# Patient Record
Sex: Female | Born: 1965
Health system: Southern US, Community
[De-identification: ages and names within clinical notes are randomized; demographics above are authoritative.]

## PROBLEM LIST (undated history)

## (undated) DIAGNOSIS — Z8719 Personal history of other diseases of the digestive system: Secondary | ICD-10-CM

## (undated) DIAGNOSIS — M1990 Unspecified osteoarthritis, unspecified site: Secondary | ICD-10-CM

## (undated) DIAGNOSIS — M79645 Pain in left finger(s): Secondary | ICD-10-CM

## (undated) DIAGNOSIS — F32A Depression, unspecified: Secondary | ICD-10-CM

## (undated) DIAGNOSIS — J329 Chronic sinusitis, unspecified: Secondary | ICD-10-CM

## (undated) DIAGNOSIS — S0300XA Dislocation of jaw, unspecified side, initial encounter: Secondary | ICD-10-CM

## (undated) DIAGNOSIS — D72819 Decreased white blood cell count, unspecified: Secondary | ICD-10-CM

## (undated) DIAGNOSIS — G8929 Other chronic pain: Secondary | ICD-10-CM

## (undated) DIAGNOSIS — Z9889 Other specified postprocedural states: Secondary | ICD-10-CM

## (undated) DIAGNOSIS — M7711 Lateral epicondylitis, right elbow: Secondary | ICD-10-CM

## (undated) DIAGNOSIS — F329 Major depressive disorder, single episode, unspecified: Secondary | ICD-10-CM

## (undated) DIAGNOSIS — K219 Gastro-esophageal reflux disease without esophagitis: Secondary | ICD-10-CM

## (undated) DIAGNOSIS — R112 Nausea with vomiting, unspecified: Secondary | ICD-10-CM

## (undated) DIAGNOSIS — G4700 Insomnia, unspecified: Secondary | ICD-10-CM

## (undated) DIAGNOSIS — M25511 Pain in right shoulder: Secondary | ICD-10-CM

## (undated) DIAGNOSIS — M25559 Pain in unspecified hip: Secondary | ICD-10-CM

## (undated) DIAGNOSIS — R442 Other hallucinations: Secondary | ICD-10-CM

## (undated) HISTORY — PX: ESOPHAGOGASTRODUODENOSCOPY: SHX1529

## (undated) HISTORY — DX: Major depressive disorder, single episode, unspecified: F32.9

## (undated) HISTORY — DX: Personal history of other diseases of the digestive system: Z87.19

## (undated) HISTORY — PX: CERVICAL DISCECTOMY: SHX98

## (undated) HISTORY — DX: Depression, unspecified: F32.A

## (undated) HISTORY — DX: Dislocation of jaw, unspecified side, initial encounter: S03.00XA

## (undated) HISTORY — PX: HEMORROIDECTOMY: SUR656

## (undated) HISTORY — PX: OVARIAN CYST SURGERY: SHX726

## (undated) HISTORY — DX: Other chronic pain: G89.29

## (undated) HISTORY — DX: Other hallucinations: R44.2

## (undated) HISTORY — DX: Gastro-esophageal reflux disease without esophagitis: K21.9

## (undated) HISTORY — DX: Pain in unspecified hip: M25.559

## (undated) HISTORY — DX: Unspecified osteoarthritis, unspecified site: M19.90

## (undated) HISTORY — DX: Chronic sinusitis, unspecified: J32.9

## (undated) HISTORY — DX: Insomnia, unspecified: G47.00

## (undated) HISTORY — DX: Pain in right shoulder: M25.511

## (undated) HISTORY — PX: APPENDECTOMY: SHX54

## (undated) HISTORY — DX: Pain in left finger(s): M79.645

## (undated) HISTORY — DX: Decreased white blood cell count, unspecified: D72.819

## (undated) HISTORY — PX: TMJ ARTHROSCOPY: SHX1067

## (undated) HISTORY — DX: Lateral epicondylitis, right elbow: M77.11

---

## 1997-10-25 ENCOUNTER — Inpatient Hospital Stay (HOSPITAL_COMMUNITY): Admission: EM | Admit: 1997-10-25 | Discharge: 1997-10-29 | Payer: Self-pay | Admitting: Emergency Medicine

## 1999-01-29 ENCOUNTER — Other Ambulatory Visit: Admission: RE | Admit: 1999-01-29 | Discharge: 1999-01-29 | Payer: Self-pay | Admitting: Obstetrics and Gynecology

## 2001-05-19 ENCOUNTER — Other Ambulatory Visit: Admission: RE | Admit: 2001-05-19 | Discharge: 2001-05-19 | Payer: Self-pay | Admitting: Obstetrics and Gynecology

## 2002-06-19 ENCOUNTER — Other Ambulatory Visit: Admission: RE | Admit: 2002-06-19 | Discharge: 2002-06-19 | Payer: Self-pay | Admitting: Obstetrics and Gynecology

## 2003-10-11 ENCOUNTER — Other Ambulatory Visit: Admission: RE | Admit: 2003-10-11 | Discharge: 2003-10-11 | Payer: Self-pay | Admitting: Obstetrics and Gynecology

## 2008-03-07 ENCOUNTER — Ambulatory Visit: Payer: Self-pay | Admitting: *Deleted

## 2008-03-07 DIAGNOSIS — M1990 Unspecified osteoarthritis, unspecified site: Secondary | ICD-10-CM

## 2008-03-07 DIAGNOSIS — G43009 Migraine without aura, not intractable, without status migrainosus: Secondary | ICD-10-CM | POA: Insufficient documentation

## 2008-03-07 DIAGNOSIS — M542 Cervicalgia: Secondary | ICD-10-CM

## 2008-03-07 DIAGNOSIS — K219 Gastro-esophageal reflux disease without esophagitis: Secondary | ICD-10-CM

## 2008-03-07 DIAGNOSIS — Z8719 Personal history of other diseases of the digestive system: Secondary | ICD-10-CM

## 2008-03-07 DIAGNOSIS — J309 Allergic rhinitis, unspecified: Secondary | ICD-10-CM | POA: Insufficient documentation

## 2008-03-07 DIAGNOSIS — F32A Depression, unspecified: Secondary | ICD-10-CM | POA: Insufficient documentation

## 2008-03-07 DIAGNOSIS — S161XXA Strain of muscle, fascia and tendon at neck level, initial encounter: Secondary | ICD-10-CM | POA: Insufficient documentation

## 2008-03-07 DIAGNOSIS — F329 Major depressive disorder, single episode, unspecified: Secondary | ICD-10-CM

## 2008-03-07 HISTORY — DX: Unspecified osteoarthritis, unspecified site: M19.90

## 2008-03-07 HISTORY — DX: Personal history of other diseases of the digestive system: Z87.19

## 2008-03-07 HISTORY — DX: Allergic rhinitis, unspecified: J30.9

## 2008-03-07 HISTORY — DX: Gastro-esophageal reflux disease without esophagitis: K21.9

## 2008-03-07 HISTORY — DX: Cervicalgia: M54.2

## 2008-03-07 HISTORY — DX: Migraine without aura, not intractable, without status migrainosus: G43.009

## 2008-03-09 DIAGNOSIS — D72819 Decreased white blood cell count, unspecified: Secondary | ICD-10-CM | POA: Insufficient documentation

## 2008-03-09 LAB — CONVERTED CEMR LAB
ALT: 17 units/L (ref 0–35)
AST: 23 units/L (ref 0–37)
Alkaline Phosphatase: 58 units/L (ref 39–117)
BUN: 13 mg/dL (ref 6–23)
Basophils Absolute: 0 10*3/uL (ref 0.0–0.1)
Basophils Relative: 0.4 % (ref 0.0–3.0)
Calcium: 9 mg/dL (ref 8.4–10.5)
Chloride: 104 meq/L (ref 96–112)
Creatinine, Ser: 1 mg/dL (ref 0.4–1.2)
Eosinophils Absolute: 0.1 10*3/uL (ref 0.0–0.7)
Eosinophils Relative: 1.2 % (ref 0.0–5.0)
HCT: 39.7 % (ref 36.0–46.0)
MCHC: 33.8 g/dL (ref 30.0–36.0)
MCV: 90.7 fL (ref 78.0–100.0)
Monocytes Absolute: 0.7 10*3/uL (ref 0.1–1.0)
Neutro Abs: 2.4 10*3/uL (ref 1.4–7.7)
Neutrophils Relative %: 58 % (ref 43.0–77.0)
RBC: 4.38 M/uL (ref 3.87–5.11)
Total Bilirubin: 0.6 mg/dL (ref 0.3–1.2)
WBC: 4.2 10*3/uL — ABNORMAL LOW (ref 4.5–10.5)

## 2008-06-29 ENCOUNTER — Telehealth (INDEPENDENT_AMBULATORY_CARE_PROVIDER_SITE_OTHER): Payer: Self-pay | Admitting: *Deleted

## 2008-09-03 ENCOUNTER — Telehealth: Payer: Self-pay | Admitting: Internal Medicine

## 2008-09-12 ENCOUNTER — Ambulatory Visit: Payer: Self-pay | Admitting: *Deleted

## 2008-12-18 ENCOUNTER — Telehealth (INDEPENDENT_AMBULATORY_CARE_PROVIDER_SITE_OTHER): Payer: Self-pay | Admitting: *Deleted

## 2008-12-20 ENCOUNTER — Encounter: Payer: Self-pay | Admitting: Internal Medicine

## 2009-03-14 ENCOUNTER — Ambulatory Visit: Payer: Self-pay | Admitting: Internal Medicine

## 2009-03-14 LAB — CONVERTED CEMR LAB
Albumin: 4.3 g/dL (ref 3.5–5.2)
Basophils Relative: 0 % (ref 0–1)
Bilirubin, Direct: 0.1 mg/dL (ref 0.0–0.3)
CO2: 24 meq/L (ref 19–32)
Chloride: 107 meq/L (ref 96–112)
Creatinine, Ser: 1.13 mg/dL (ref 0.40–1.20)
Eosinophils Absolute: 0.1 10*3/uL (ref 0.0–0.7)
Glucose, Bld: 87 mg/dL (ref 70–99)
HDL: 55 mg/dL (ref >39)
Hemoglobin: 13.5 g/dL (ref 12.0–15.0)
LDL Cholesterol: 105 mg/dL — ABNORMAL HIGH (ref 0–99)
Lymphs Abs: 1.4 10*3/uL (ref 0.7–4.0)
MCHC: 32.5 g/dL (ref 30.0–36.0)
MCV: 88.9 fL (ref 78.0–100.0)
Monocytes Absolute: 0.8 10*3/uL (ref 0.1–1.0)
Monocytes Relative: 16 % — ABNORMAL HIGH (ref 3–12)
Neutrophils Relative %: 55 % (ref 43–77)
RBC: 4.67 M/uL (ref 3.87–5.11)
Sodium: 141 meq/L (ref 135–145)
TSH: 1.871 microintl units/mL (ref 0.350–4.500)
Total Bilirubin: 0.4 mg/dL (ref 0.3–1.2)
Total CHOL/HDL Ratio: 3.4
WBC: 5.2 10*3/uL (ref 4.0–10.5)

## 2009-05-08 ENCOUNTER — Ambulatory Visit (HOSPITAL_COMMUNITY): Admission: AD | Admit: 2009-05-08 | Discharge: 2009-05-09 | Payer: Self-pay | Admitting: Obstetrics & Gynecology

## 2009-07-03 ENCOUNTER — Telehealth: Payer: Self-pay | Admitting: Internal Medicine

## 2009-07-05 ENCOUNTER — Ambulatory Visit: Payer: Self-pay | Admitting: Family

## 2009-07-05 DIAGNOSIS — J329 Chronic sinusitis, unspecified: Secondary | ICD-10-CM | POA: Insufficient documentation

## 2009-07-05 DIAGNOSIS — K5289 Other specified noninfective gastroenteritis and colitis: Secondary | ICD-10-CM | POA: Insufficient documentation

## 2009-07-05 LAB — CONVERTED CEMR LAB
CO2: 26 meq/L (ref 19–32)
Chloride: 106 meq/L (ref 96–112)
Glucose, Bld: 81 mg/dL (ref 70–99)
HCT: 41.2 % (ref 36.0–46.0)
Hemoglobin: 13.3 g/dL (ref 12.0–15.0)
Lymphocytes Relative: 34 % (ref 12–46)
Lymphs Abs: 1.9 10*3/uL (ref 0.7–4.0)
Monocytes Absolute: 0.6 10*3/uL (ref 0.1–1.0)
Monocytes Relative: 10 % (ref 3–12)
Neutro Abs: 3.1 10*3/uL (ref 1.7–7.7)
Potassium: 4.4 meq/L (ref 3.5–5.3)
RBC: 4.69 M/uL (ref 3.87–5.11)
Sodium: 142 meq/L (ref 135–145)

## 2009-07-06 ENCOUNTER — Encounter: Payer: Self-pay | Admitting: Family

## 2009-07-08 ENCOUNTER — Telehealth: Payer: Self-pay | Admitting: Family

## 2009-07-22 ENCOUNTER — Encounter: Payer: Self-pay | Admitting: Internal Medicine

## 2009-07-23 LAB — CONVERTED CEMR LAB: Pap Smear: NORMAL

## 2009-09-16 ENCOUNTER — Telehealth: Payer: Self-pay | Admitting: Internal Medicine

## 2009-09-30 ENCOUNTER — Ambulatory Visit: Payer: Self-pay | Admitting: Family

## 2009-09-30 ENCOUNTER — Telehealth: Payer: Self-pay | Admitting: Family

## 2009-09-30 DIAGNOSIS — J40 Bronchitis, not specified as acute or chronic: Secondary | ICD-10-CM | POA: Insufficient documentation

## 2009-10-10 ENCOUNTER — Telehealth: Payer: Self-pay | Admitting: Internal Medicine

## 2009-10-11 ENCOUNTER — Ambulatory Visit: Payer: Self-pay | Admitting: Internal Medicine

## 2009-11-13 ENCOUNTER — Encounter: Payer: Self-pay | Admitting: Internal Medicine

## 2009-12-19 ENCOUNTER — Ambulatory Visit: Payer: Self-pay | Admitting: Internal Medicine

## 2009-12-25 ENCOUNTER — Encounter: Payer: Self-pay | Admitting: Internal Medicine

## 2010-01-09 ENCOUNTER — Encounter: Payer: Self-pay | Admitting: Internal Medicine

## 2010-02-04 ENCOUNTER — Encounter: Payer: Self-pay | Admitting: Internal Medicine

## 2010-02-19 ENCOUNTER — Encounter: Payer: Self-pay | Admitting: Internal Medicine

## 2010-03-03 ENCOUNTER — Encounter: Payer: Self-pay | Admitting: Internal Medicine

## 2010-03-03 ENCOUNTER — Ambulatory Visit: Payer: Self-pay | Admitting: Family

## 2010-03-03 DIAGNOSIS — K140 Glossitis: Secondary | ICD-10-CM | POA: Insufficient documentation

## 2010-03-03 LAB — CONVERTED CEMR LAB
Folate: >20 ng/mL
Vitamin B-12: 670 pg/mL (ref 211–911)

## 2010-03-04 ENCOUNTER — Encounter: Payer: Self-pay | Admitting: Family

## 2010-04-08 LAB — HM PAP SMEAR: HM Pap smear: NORMAL

## 2010-04-28 ENCOUNTER — Encounter: Payer: Self-pay | Admitting: Internal Medicine

## 2010-04-28 LAB — HM MAMMOGRAPHY

## 2010-06-08 LAB — HM PAP SMEAR: HM Pap smear: NORMAL

## 2010-06-30 ENCOUNTER — Encounter: Payer: Self-pay | Admitting: Neurosurgery

## 2010-07-08 NOTE — Progress Notes (Signed)
Summary: pharmacy correction  Phone Note Call from Patient   Caller: Patient Details for Reason: Rx   Summary of Call: Pt was seen today-- rx for tessuslon pearls and Z-pak    [facility]   [address]   HP  she is at pharmacy now. all other med to [facility]  . Initial call taken by: Darral Dash,  September 30, 2009 12:21 PM  Follow-up for Phone Call        Sp[facility] to [facility] and gave verbal rx for [employer] and Z-pak. Left message on [facility] pharmacy voicemail to cancel rx for [employer] and Zpak. Helen notified pt. that I was correcting rxs.  Mervin Kung CMA  September 30, 2009 12:40 PM

## 2010-07-08 NOTE — Letter (Signed)
Summary: [facility]  [facility]   Imported By: Lanelle Bal 11/05/2009 09:13:45  _____________________________________________________________________  External Attachment:    Type:   Image     Comment:   External Document

## 2010-07-08 NOTE — Progress Notes (Signed)
Summary: Status Update  Phone Note Call from Patient Call back at Home Phone [phone]   Caller: Patient Summary of Call: patient called and left voice message requesting a call back because she is not feeling any better.  call returned to patient she is complaining of unresolved cough and chest congestion, she was seen last week  for Bronchitis and she is still having some nasal drainage yellow in color and productive cough the same, with the chest congestion- she denies wheezing. Cough is worse at night and less throughout the day. She denies fever, but states she did not check it.  She has taken Tylenol with some improvement with the pain from cough, Mucinex and Sudafed with little to no relief. She states she has completed a Z-pak, but was not sure if she would need another round of antibiotics. Initial call taken by: Glendell Docker CMA,  Oct 10, 2009 1:16 PM  Follow-up for Phone Call        Pt needs OV Follow-up by: D. Thomos Lemons DO,  Oct 10, 2009 5:24 PM  Additional Follow-up for Phone Call Additional follow up Details #1::        attempted to contact patient at [phone] no answer, voice message left informing patient per Dr Artist Pais instructions. She was advised to call back to schedule office visit. Additional Follow-up by: Glendell Docker CMA,  Oct 10, 2009 5:34 PM

## 2010-07-08 NOTE — Letter (Signed)
Summary: [facility]  [facility]   Imported By: Lanelle Bal 02/07/2010 09:08:41  _____________________________________________________________________  External Attachment:    Type:   Image     Comment:   External Document

## 2010-07-08 NOTE — Letter (Signed)
Summary: [facility]  [facility]   Imported By: Lanelle Bal 12/06/2009 08:15:32  _____________________________________________________________________  External Attachment:    Type:   Image     Comment:   External Document

## 2010-07-08 NOTE — Assessment & Plan Note (Signed)
Summary: F/U -DK rsc with pt/mhf   Vital Signs:  Patient profile:   45 year old female Menstrual status:  regular Weight:      153.50 pounds BMI:     24.13 O2 Sat:      98 % on 0.25 L/min Temp:     98.0 degrees F oral Pulse rate:   80 / minute Pulse rhythm:   regular Resp:     18 per minute BP sitting:   100 / 60  (right arm) Cuff size:   regular  Vitals Entered By: Glendell Docker CMA (December 19, 2009 9:11 AM)  O2 Flow:  0.25 L/min CC: Rm 3- Follow up  Is Patient Diabetic? No Pain Assessment Patient in pain? no      Comments medication refills,s/p cervical  disketomy 6/9 Dr Wyline Mood @ [facility], f/u next week Wednesday   Primary Care Provider:  D. Thomos Lemons DO  CC:  Rm 3- Follow up .  History of Present Illness: 85 /o white female for f/u anterior cervical disectomy completed at [facility] - Dr Wyline Mood neck feeling much better  migraine headaches - stable  Depression - stable  Preventive Screening-Counseling & Management  Alcohol-Tobacco     Smoking Status: never  Allergies: 1)  ! 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   Neck:  incision well healed Lungs:  normal respiratory effort, normal breath sounds, no crackles, and no wheezes.   Heart:  normal rate, regular rhythm, no murmur, and no gallop.     Impression &  Recommendations:  Problem # 1:  NECK PAIN, CHRONIC (ICD-723.1) Assessment Improved S/P C5-6 cervical fusion.  chronic pain and restriction in ROM resolved.  we discussed tapering gabapentin to 300 mg at bedtime  The following medications were removed from the medication list:    Tylenol Extra Strength 500 Mg Tabs (Acetaminophen) .Marland Kitchen... As needed.  Problem # 2:  DEPRESSION (ICD-311) stable.   we discussed tapering to 300 mg once daily  Her updated medication list for this problem includes:    Wellbutrin Xl 150 Mg Xr24h-tab (Bupropion hcl) .Marland KitchenMarland KitchenMarland KitchenMarland Kitchen 3 tablets by mouth once daily  Problem # 3:  COMMON MIGRAINE (ICD-346.10) she has had infrequent migraines.   maintain gabapentin at bedtime for headache prophylaxis. less headaches since neck surgery The following medications were removed from the medication list:    Tylenol Extra Strength 500 Mg Tabs (Acetaminophen) .Marland Kitchen... As needed. Her updated medication list for this problem includes:    Imitrex 50 Mg Tabs (Sumatriptan succinate) .Marland Kitchen... 1 tab at onset, and may repeat in 2 hours if not relieved, max of 3 tabs in 24 hours  Complete Medication List: 1)  Neurontin 300 Mg Caps (Gabapentin) .... One tablet by mouth 3 times a day 2)  Wellbutrin Xl 150 Mg Xr24h-tab (Bupropion hcl) .... 3 tablets by mouth once daily 3)  Claritin 10 Mg  Caps (Loratadine) .... Take 1 tablet daily as needed 4)  Imitrex 50 Mg Tabs (Sumatriptan succinate) .Marland Kitchen.. 1 tab at onset, and may repeat in 2 hours if not relieved, max of 3 tabs in 24 hours 5)  Lunesta 2 Mg Tabs (Eszopiclone) .... One tab by mouth at bedtime 6)  Citracal Plus Tabs (Multiple minerals-vitamins) .... Take 1 tablet by mouth once a day 7)  Centrum Tabs (Multiple vitamins-minerals) .... Take 1 tablet by mouth once a day  Patient Instructions: 1)  Please schedule a follow-up appointment in 6 months. 2)  BMP prior to visit, ICD-9: 311 3)  Hepatic Panel prior to visit, ICD-9: 311 4)  TSH prior to visit,  ICD-9: 311 5)  Please return for lab work one (1) week before your next appointment.  6)  The highlighted prescriptions were electronically sent to your pharmacy Prescriptions: IMITREX 50 MG TABS (SUMATRIPTAN SUCCINATE) 1 tab at onset, and may repeat in 2 hours if not relieved, max of 3 tabs in 24 hours  #12 x 5   Entered and Authorized by:   D. Thomos Lemons DO   Signed by:   D. Thomos Lemons DO on 12/19/2009   Method used:   Electronically to        [facility]* (retail)       [facility] [place], [place]  [address]       Ph: [phone]       Fax: [phone]   RxID:   [id number] NEURONTIN 300 MG CAPS (GABAPENTIN) one tablet by mouth 3 times a day  #270 x 5   Entered and Authorized by:   D. Thomos Lemons DO   Signed by:   D. Thomos Lemons DO on 12/19/2009   Method used:   Electronically to        [facility]* (retail)       [facility] [place], [place]  [address]       Ph: [phone]       Fax: [phone]   RxID:   [id number] WELLBUTRIN XL 150 MG XR24H-TAB (BUPROPION HCL) 3 tablets by mouth once daily Brand medically necessary #270 x 1   Entered and Authorized by:   D. Thomos Lemons DO   Signed by:   D. Thomos Lemons DO on 12/19/2009   Method used:   Electronically to        [facility]* (retail)       [facility] [place], [place]  [address]       Ph: [phone]       Fax: [phone]   RxID:   [id number]   Current Allergies (reviewed today): ! PCN

## 2010-07-08 NOTE — Assessment & Plan Note (Signed)
Summary: nauseated, no appetite- jr   Vital Signs:  Patient profile:   45 year old female Menstrual status:  regular Weight:      154 pounds Temp:     97.5 degrees F oral Pulse rate:   66 / minute BP sitting:   110 / 70  (right arm)  Vitals Entered By: Doristine Devoid (July 05, 2009 3:43 PM) CC: sinus pressure and HA along w/ nausea x1 wk no vomiting some loose stool   Primary Care Provider:  Dondra Spry DO  CC:  sinus pressure and HA along w/ nausea x1 wk no vomiting some loose stool.  History of Present Illness: Ms Kunin is a 45 year old female who noted nause, abd pain,cramping which started on friday.  Had temp 99.5 on monday night.  She continues to have anorexia.  Today she feel more nausea, gurgling.  Also notes + nasal congestion which is accompanied with sinus congestion.  Slightly yellow nasal discharge, denies cough.  Works in a lab.  She is having some loose stools about 2-3 x a day since monday.  Denies blood in stool. Tells me that she has not thrown up.  Denies travel outside of the country.  Notes that she had a laparoscopy 12/1.  Denies recent antibiotic use.    Allergies: 1)  ! Pcn  Physical Exam  General:  Well-developed,well-nourished,in no acute distress; alert,appropriate and cooperative throughout examination Head:  Normocephalic and atraumatic without obvious abnormalities. No apparent alopecia or balding. Eyes:  No corneal or conjunctival inflammation noted. EOMI. Perrla. Funduscopic exam benign, without hemorrhages, exudates or papilledema. Vision grossly normal. Ears:  External ear exam shows no significant lesions or deformities.  Otoscopic examination reveals clear canals, tympanic membranes are intact bilaterally without bulging, retraction, inflammation or discharge. Hearing is grossly normal bilaterally. Mouth:  Oral mucosa and oropharynx without lesions or exudates.  Teeth in good repair. Neck:  No deformities, masses, or tenderness noted. Lungs:   Normal respiratory effort, chest expands symmetrically. Lungs are clear to auscultation, no crackles or wheezes. Heart:  Normal rate and regular rhythm. S1 and S2 normal without gallop, murmur, click, rub or other extra sounds. Abdomen:  mild RLQ tenderness in area of recent laparoscopy.  No guarding, no distension.     Impression & Recommendations:  Problem # 1:  GASTROENTERITIS (ICD-558.9) Assessment New Will treat with levaquin, check CBC to evaluate WBC and check BMET to assess hydration. Will give zofran for nausea.  Encouraged fluids, progress diet as tolerated.  If symptoms worsen will consider CT abd/pelvis, but hopefully will start to improve in the next few days.   Orders: T-Basic Metabolic Panel [phone]) T-CBC w/Diff [phone])  Her updated medication list for this problem includes:    Zofran 4 Mg Tabs (Ondansetron hcl) ..... One tablet by mouth every 8 hours as needed for nausea  Problem # 2:  SINUSITIS (ICD-473.9) Assessment: New will treat with levaquin.   Her updated medication list for this problem includes:    Flonase 50 Mcg/act Susp (Fluticasone propionate) .Marland Kitchen... 1 spray each nostril once daily    Levaquin 500 Mg Tabs (Levofloxacin) ..... One tablet by mouth daily x 7 days  Complete Medication List: 1)  Neurontin 300 Mg Caps (Gabapentin) .... One tablet by mouth 3 times a day 2)  Wellbutrin Xl 150 Mg Xr24h-tab (Bupropion hcl) .... 3 tablets by mouth once daily (patient cannot tolerate generic - please give as written) 3)  Claritin 10 Mg Caps (Loratadine) .... As  needed 4)  Imitrex 50 Mg Tabs (Sumatriptan succinate) .Marland Kitchen.. 1 tab at onset, and may repeat in 2 hours if not relieved, max of 3 tabs in 24 hours 5)  Lunesta 2 Mg Tabs (Eszopiclone) .... One tab by mouth at bedtime 6)  Flonase 50 Mcg/act Susp (Fluticasone propionate) .Marland Kitchen.. 1 spray each nostril once daily 7)  Zofran 4 Mg Tabs (Ondansetron hcl) .... One tablet by mouth every 8 hours as needed for  nausea 8)  Levaquin 500 Mg Tabs (Levofloxacin) .... One tablet by mouth daily x 7 days  Patient Instructions: 1)  Please call if you develop fever over 101 or worsening abdominal pain, or if your appetite does not start to improve.   2)  Go to ER if you develop severe abdominal pain or bloody stools. Prescriptions: LEVAQUIN 500 MG TABS (LEVOFLOXACIN) one tablet by mouth daily x 7 days  #7 x 0   Entered and Authorized by:   Lemont Fillers FNP   Signed by:   Lemont Fillers FNP on 07/05/2009   Method used:   [facility] [phone]* (retail)       [address]       [place], [place]  [address]       Ph: [phone]       Fax: [phone]   RxID:   [id number] ZOFRAN 4 MG TABS (ONDANSETRON HCL) one tablet by mouth every 8 hours as needed for nausea  #300 x 0   Entered and Authorized by:   Lemont Fillers FNP   Signed by:   Lemont Fillers FNP on 07/05/2009   Method used:   [facility]        [facility] [phone]* (retail)       [address]       [place], [place]  [address]       Ph: [phone]       Fax: [phone]   RxID:   [id number]   Appended Document: nauseated, no appetite- jr called pharmacy- clarified zofran is #30, not #300 sent in error.

## 2010-07-08 NOTE — Progress Notes (Signed)
Summary: REFILL REQUEST  Phone Note Refill Request Message from:  Fax from Pharmacy on September 16, 2009 10:00 AM  Refills Requested: Medication #1:  NEURONTIN 300 MG CAPS one tablet by mouth 3 times a day   Dosage confirmed as above?Dosage Confirmed   Brand Name Necessary? No   Supply Requested: 3 months   Last Refilled: 07/22/2009  Medication #2:  WELLBUTRIN XL 150 MG XR24H-TAB 3 tablets by mouth once daily (patient cannot tolerate generic - please give as written)   Dosage confirmed as above?Dosage Confirmed   Brand Name Necessary? No   Supply Requested: 3 months   Last Refilled: 06/28/2009 [facility] [address] [place] [phone] FAX [phone]   Method Requested: Electronic Next Appointment Scheduled: NONE Initial call taken by: Roselle Locus,  September 16, 2009 10:05 AM  Follow-up for Phone Call        refill x 1.  needs OV for additional refills Follow-up by: D. Thomos Lemons DO,  September 16, 2009 2:41 PM  Additional Follow-up for Phone Call Additional follow up Details #1::        [facility], call placed to patient, she was advised follow up was needed for additional refills. Office visit has been scheduled fro 10/03/2009 Additional Follow-up by: Glendell Docker CMA,  September 16, 2009 3:22 PM    Prescriptions: WELLBUTRIN XL 150 MG XR24H-TAB (BUPROPION HCL) 3 tablets by mouth once daily (patient cannot tolerate generic - please give as written)  #30 x 0   Entered by:   Glendell Docker CMA   Authorized by:   D. Thomos Lemons DO   Signed by:   Glendell Docker CMA on 09/16/2009   Method used:   Faxed to ...       [facility] (mail-order)             [place], [place]         Ph: [phone]       Fax: [phone]   RxID:   [id number] NEURONTIN 300 MG CAPS (GABAPENTIN) one tablet by mouth 3 times a day  #90 x 0   Entered by:   Glendell Docker CMA   Authorized by:   D. Thomos Lemons DO   Signed by:    Glendell Docker CMA on 09/16/2009   Method used:   Faxed to ...       [facility] (mail-order)             [place], [place]         Ph: [phone]       Fax: [phone]   RxID:   [id number] WELLBUTRIN XL 150 MG XR24H-TAB (BUPROPION HCL) 3 tablets by mouth once daily (patient cannot tolerate generic - please give as written)  #30 x 0   Entered by:   Glendell Docker CMA   Authorized by:   D. Thomos Lemons DO   Signed by:   Glendell Docker CMA on 09/16/2009   Method used:   [facility]ronically to        [facility] [phone]* (retail)       [address]       [place], [place]  [address]       Ph: [phone]       Fax: [phone]   RxID:   [id number] NEURONTIN 300 MG CAPS (GABAPENTIN) one tablet by mouth 3 times a day  #  90 x 0   Entered by:   Glendell Docker CMA   Authorized by:   D. Thomos Lemons DO   Signed by:   Glendell Docker CMA on 09/16/2009   Method used:   [facility]ronically to        [facility] [phone]* (retail)       [address]       [place], [place]  [address]       Ph: [phone]       Fax: [phone]   RxID:   [id number]

## 2010-07-08 NOTE — Progress Notes (Signed)
Summary: Wellbuttrin - 2 Day Supply  Phone Note Refill Request Message from:  Patient on July 03, 2009 11:33 AM  Refills Requested: Medication #1:  WELLBUTRIN XL 150 MG XR24H-TAB 3 tablets by mouth once daily (patient cannot tolerate generic - please give as written)   Dosage confirmed as above?Dosage Confirmed   Brand Name Necessary? No PATIENT IS HOME SICK.  SHE LEFT HER WELLBUTRIN IN HER OFFICE IN [place].  SHE DOES NOT WANT TO DRIVE TO [place]  COULD YOU CALL IN 2 DAYS TO [facility] [address]    [phone]   Method Requested: Telephone to Pharmacy Initial call taken by: Roselle Locus,  July 03, 2009 11:35 AM    Prescriptions: WELLBUTRIN XL 150 MG XR24H-TAB (BUPROPION HCL) 3 tablets by mouth once daily (patient cannot tolerate generic - please give as written)  #6 x 0   Entered by:   Glendell Docker CMA   Authorized by:   D. Thomos Lemons DO   Signed by:   Glendell Docker CMA on 07/03/2009   Method used:   Electronically to        [facility] [phone]* (retail)       [address]       [place], [place]  [address]       Ph: [phone]       Fax: [phone]   RxID:   [id number]

## 2010-07-08 NOTE — Letter (Signed)
   [place] [facility] [address] [place], [place]  [address]  [place] Phone: [phone]      March 04, 2010   [facility] [address] [place], [place] [address]  RE:  LAB RESULTS  Dear  Ms. ROUILLARD,  The following is an interpretation of your most recent lab tests.  Please take note of any instructions provided or changes to medications that have resulted from your lab work.   Your B12 and Folate levels are normal.    Sincerely Yours,    Lemont Fillers FNP  Appended Document:  Mailed.

## 2010-07-08 NOTE — Miscellaneous (Signed)
Summary: 6 month follow up labs  Clinical Lists Changes  Orders: Added new Test order of T-Basic Metabolic Panel [phone]) - Signed Added new Test order of T-Hepatic Function [phone]) - Signed Added new Test order of T-TSH [phone]) - Signed

## 2010-07-08 NOTE — Miscellaneous (Signed)
Summary: mammogram  Clinical Lists Changes  Observations: Added new observation of MAMMOGRAM: stable, no suspicious findings. (04/28/2010 13:35)      Preventive Care Screening  Mammogram:    Date:  04/28/2010    Results:  stable, no suspicious findings.

## 2010-07-08 NOTE — Letter (Signed)
Summary: [facility]  [facility]   Imported By: Lanelle Bal 03/11/2010 09:16:36  _____________________________________________________________________  External Attachment:    Type:   Image     Comment:   External Document

## 2010-07-08 NOTE — Assessment & Plan Note (Signed)
Summary: congestion cough/mhf   Vital Signs:  Patient profile:   45 year old female Menstrual status:  regular Height:      67 inches Weight:      156.50 pounds BMI:     24.60 O2 Sat:      100 % on Room air Temp:     98.3 degrees F oral Pulse rate:   66 / minute Pulse rhythm:   regular Resp:     20 per minute BP sitting:   106 / 60  (right arm) Cuff size:   regular  Vitals Entered By: Glendell Docker CMA (Oct 11, 2009 10:17 AM)  O2 Flow:  Room air CC: Rm 2- Head congestion Is Patient Diabetic? No   Primary Care Provider:  Dondra Spry DO  CC:  Rm 2- Head congestion.  History of Present Illness: 45 y/o female c/o productive cough yellow in color, head/sinus congestion, and nasal drainage, no fever, no sore throat, ongoing for the past 2 weeks.  completed Z-Pak about 1 week and half ago, no improvement. Some improvement in chest tightness. Cough is worse at night when she lays down and drags throughout the day  Preventive Screening-Counseling & Management  Alcohol-Tobacco     Smoking Status: never  Allergies: 1)  ! 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   Ears:  R ear normal and L ear normal.   Mouth:  pharyngeal erythema and postnasal drip.   Lungs:  Normal respiratory effort, chest expands symmetrically. Lungs are clear to auscultation, no crackles or wheezes. Heart:   Normal rate and regular rhythm. S1 and S2 normal without gallop, murmur, click, rub or other extra sounds.   Impression & Recommendations:  Problem # 1:  SINUSITIS (ICD-473.9) 45 y/o with persistent purulent nasal discharge and facial pressure after Z pak.  tx with levaquin. Patient advised to call office if symptoms persist or worsen.  The following medications were removed from the medication list:    Zithromax Z-pak 250 Mg Tabs (Azithromycin) .Marland Kitchen... 2 tabs by mouth today, then one tab by mouth x 4 more days Her updated medication list for this problem includes:    Flonase 50 Mcg/act Susp (Fluticasone propionate) .Marland Kitchen... 1 spray each nostril once daily    Sudafed Pe Maximum Strength 10 Mg Tabs (Phenylephrine hcl) .Marland Kitchen... As needed.    Hydrocod Polst-chlorphen Polst 10-8 Mg/53ml Lqcr (Chlorpheniramine-hydrocodone) .Marland KitchenMarland KitchenMarland KitchenMarland Kitchen 5 ml two times a day prn    Levaquin 500 Mg Tabs (Levofloxacin) ..... One by mouth qd  Complete Medication List: 1)  Neurontin 300 Mg Caps (Gabapentin) .... One tablet by mouth 3 times a day 2)  Wellbutrin Xl 150 Mg Xr24h-tab (Bupropion hcl) .... 3 tablets by mouth once daily (patient cannot tolerate generic - please give as written) 3)  Claritin 10 Mg Caps (Loratadine) .... Take 1 tablet daily as needed 4)  Imitrex 50 Mg Tabs (Sumatriptan succinate) .Marland Kitchen.. 1 tab at onset,  and may repeat in 2 hours if not relieved, max of 3 tabs in 24 hours 5)  Lunesta 2 Mg Tabs (Eszopiclone) .... One tab by mouth at bedtime 6)  Flonase 50 Mcg/act Susp (Fluticasone propionate) .Marland Kitchen.. 1 spray each nostril once daily 7)  Tylenol Extra Strength 500 Mg Tabs (Acetaminophen) .... As needed. 8)  Sudafed Pe Maximum Strength 10 Mg Tabs (Phenylephrine hcl) .... As needed. 9)  Hydrocod Polst-chlorphen Polst 10-8 Mg/66ml Lqcr (Chlorpheniramine-hydrocodone) .... 5 ml two times a day prn 10)  Levaquin 500 Mg Tabs (Levofloxacin) .... One by mouth qd  Patient Instructions: 1)  Take Levquin as directed 2)  Call  our office if your symptoms do not  improve or gets worse. Prescriptions: HYDROCOD POLST-CHLORPHEN POLST 10-8 MG/5ML LQCR (CHLORPHENIRAMINE-HYDROCODONE) 5 ml two times a day prn  #60 ml x 0   Entered and Authorized by:   D. Thomos Lemons DO   Signed by:   D. Thomos Lemons DO on 10/11/2009   Method used:   Print then Give to Patient   RxID:   [id number] LEVAQUIN 500 MG TABS (LEVOFLOXACIN) one by mouth qd  #7 x 0   Entered and Authorized by:   D. Thomos Lemons DO   Signed by:   D. Thomos Lemons DO on 10/11/2009   Method used:   Electronically to        [facility]* (retail)       [facility] [place], [place]  [address]       Ph: [phone]       Fax: [phone]   RxID:   [id number]   Current Allergies (reviewed today): ! PCN   Preventive Care Screening  Mammogram:    Date:  07/23/2009    Results:  normal   Pap Smear:    Date:  07/23/2009    Results:  normal

## 2010-07-08 NOTE — Progress Notes (Signed)
  Phone Note Outgoing Call   Summary of Call: Called patient to follow up. Left message for patient to call and let us know how she is feeling.   Initial call taken by: Lemont Fillers FNP,  July 08, 2009 1:08 PM     Appended Document: Status Update    Phone Note Call from Patient Call back at [facility] Phone [phone]   Caller: Patient Summary of Call: patient called and left voice message stating she received a phone call from [facility] to see how she is doing. She states that she is doing fine and feels much better after 3 days of the antibiotics. She is requesting the results of her blood work Initial call taken by: Glendell Docker CMA,  July 08, 2009 4:52 PM  Follow-up for Phone Call        called patient, she feels better, tolerating by mouth's, is back to work.  Reviewed lab results with patient Follow-up by: Lemont Fillers FNP,  July 08, 2009 4:58 PM

## 2010-07-08 NOTE — Assessment & Plan Note (Signed)
Summary: mouth feels sore x 2 weeks/dt--Rm 4   Vital Signs:  Patient profile:   45 year old female Menstrual status:  regular Height:      67 inches Weight:      155.50 pounds BMI:     24.44 Temp:     98.0 degrees F oral Pulse rate:   66 / minute Pulse rhythm:   regular Resp:     16 per minute BP sitting:   110 / 80  (right arm) Cuff size:   regular  Vitals Entered By: Mervin Kung CMA Duncan Dull) (March 03, 2010 4:11 PM) CC: rm 4 Pt states she has had soreness, burning in her mouth and tongue when she eats x 2 weeks. Mouth is also very dry. Is Patient Diabetic? No   Primary Care Provider:  Dondra Spry DO  CC:  rm 4 Pt states she has had soreness and burning in her mouth and tongue when she eats x 2 weeks. Mouth is also very dry..  History of Present Illness: Ms Raval is a 45 year old female who presents with complaint of mouth irritation x 2 weeks.  Denies bleeding or sores.  Mouth feels dry.  Tongue has burning sensation.  Did take antibiotics several weeks ago for sinusitus.  Denies history of anemia.   Allergies: 1)  ! Pcn  Past History:  Past Medical History: Last updated: 12/19/2009 Allergic rhinitis Depression Diverticulitis, hx of GERD  Osteoarthritis   migraines chronic neck pain - DJD  Past Surgical History: Last updated: 12/19/2009 Appendectomy TMJ surgery abdominal surgery for ovarian cyst  cervial diskectomy - Dr Wyline Mood [facility] 11/2009)    Physical Exam  General:  Well-developed,well-nourished,in no acute distress; alert,appropriate and cooperative throughout examination Mouth:  good dentition, no gingival abnormalities, no erythema, and no exudates.  [place] is noted to be dry without lesions or white coating.  No lesions noted on palate or sides of mouth Psych:  Cognition and judgment appear intact. Alert and cooperative with normal attention span and concentration. No apparent delusions, illusions, hallucinations   Impression &  Recommendations:  Problem # 1:  GLOSSITIS (ICD-529.0) Assessment New Will check B12 and Folate levels.  Trial of magic mouthwash.  Pt to call if symptoms worsen or do not improve. Pt was instructed to avoid acidic foods. Orders: T-Vitamin B12 ([id number]) T-Folate ([address])  Complete Medication List: 1)  Neurontin 300 Mg Caps (Gabapentin) .... One tablet by mouth 3 times a day 2)  Wellbutrin Xl 150 Mg Xr24h-tab (Bupropion hcl) .... 3 tablets by mouth once daily 3)  Claritin 10 Mg Caps (Loratadine) .... Take 1 tablet daily as needed 4)  Imitrex 50 Mg Tabs (Sumatriptan succinate) .Marland Kitchen.. 1 tab at onset, and may repeat in 2 hours if not relieved, max of 3 tabs in 24 hours 5)  Lunesta 2 Mg Tabs (Eszopiclone) .... One tab by mouth at bedtime ads needed 6)  Citracal Plus Tabs (Multiple minerals-vitamins) .... Take 1 tablet by mouth once a day 7)  Centrum Tabs (Multiple vitamins-minerals) .... Take 1 tablet by mouth once a day 8)  Citracal Maximum 315-250 Mg-unit Tabs (Calcium citrate-vitamin d) .... One tablet by mouth two times a day 9)  Magic Mouthwash  .... One teaspoon swish and spit 4 times daily x 1 week.  Patient Instructions: 1)  Please complete your lab work downstairs. 2)  Call if your symptoms worsen or do not improve. Prescriptions: MAGIC MOUTHWASH one teaspoon swish and spit 4 times daily x 1  week.  #140 x 0   Entered and Authorized by:   Lemont Fillers FNP   Signed by:   Lemont Fillers FNP on 03/03/2010   Method used:   Faxed to ...       [facility]* (retail)       [facility] [place], [place]  [address]       Ph: [phone]       Fax: [phone]   RxID:   [id number]   Current Allergies (reviewed today): ! PCN

## 2010-07-08 NOTE — Assessment & Plan Note (Signed)
Summary: chest congestion & hard to breathe/dt   Vital Signs:  Patient profile:   45 year old female Menstrual status:  regular Height:      67 inches Weight:      157.50 pounds BMI:     24.76 O2 Sat:      100 % on Room air Temp:     98.1 degrees F oral Pulse rate:   78 / minute Pulse rhythm:   regular Resp:     12 per minute BP sitting:   118 / 78  (right arm) Cuff size:   regular  Vitals Entered By: Mervin Kung CMA (September 30, 2009 11:05 AM)  O2 Flow:  Room air CC: room 4  Pt states she has had cough and chest tightness and some sinus drainage x 1 day.   Primary Care Provider:  Dondra Spry DO  CC:  room 4  Pt states she has had cough and chest tightness and some sinus drainage x 1 day.Marland Kitchen  History of Present Illness: Pamela Sanders is a 45 year old female who presents with d/o sore throat,  chest burning/cough, mildly productive.  + Sinus drainage, + wheezing/chest tightness.  Denies history of asthma.  Felt feverish but took temperature and was afebrile.  Has tried tylenol and sudafed without much improvement.   Depression- pt reports that her depression is well controlled on Wellbutrin- ultimately she wishes to attempt a taper but not ready at this time, requests refills.  Uses Lunesta as needed for insomnia.    Chronic neck pain- uses gabapentin- reports that her neck pain is stable.   Allergies: 1)  ! Pcn  Physical Exam  General:  Well-developed,well-nourished,in no acute distress; alert,appropriate and cooperative throughout examination Head:  Normocephalic and atraumatic without obvious abnormalities. No apparent alopecia or balding. Eyes:  PERRLA Ears:  External ear exam shows no significant lesions or deformities.  Otoscopic examination reveals clear canals, tympanic membranes are intact bilaterally without bulging, retraction, inflammation or discharge. Hearing is grossly normal bilaterally. Neck:  No deformities, masses, or tenderness noted. Lungs:  Normal  respiratory effort, chest expands symmetrically. Lungs are clear to auscultation, no crackles or wheezes. Heart:  Normal rate and regular rhythm. S1 and S2 normal without gallop, murmur, click, rub or other extra sounds.   Impression & Recommendations:  Problem # 1:  BRONCHITIS (ICD-490) Assessment New Will plan to treat with zithrmax and tessalon.  No wheezing noted on exam, afebrile with RA O2 sat of 100%.  Add tessalon as needed cough and plan to have patient continue the Claritin for seasonal allergies.  Pt is requesting rx refills.  Refills provided.   The following medications were removed from the medication list:    Levaquin 500 Mg Tabs (Levofloxacin) ..... One tablet by mouth daily x 7 days Her updated medication list for this problem includes:    Zithromax Z-pak 250 Mg Tabs (Azithromycin) .Marland Kitchen... 2 tabs by mouth today, then one tab by mouth x 4 more days    Tessalon Perles 100 Mg Caps (Benzonatate) ..... One cap by mouth every 8 hours as needed for cough  Complete Medication List: 1)  Neurontin 300 Mg Caps (Gabapentin) .... One tablet by mouth 3 times a day 2)  Wellbutrin Xl 150 Mg Xr24h-tab (Bupropion hcl) .... 3 tablets by mouth once daily (patient cannot tolerate generic - please give as written) 3)  Claritin 10 Mg Caps (Loratadine) .... Take 1 tablet daily as needed 4)  Imitrex 50 Mg Tabs (  Sumatriptan succinate) .Marland Kitchen.. 1 tab at onset, and may repeat in 2 hours if not relieved, max of 3 tabs in 24 hours 5)  Lunesta 2 Mg Tabs (Eszopiclone) .... One tab by mouth at bedtime 6)  Flonase 50 Mcg/act Susp (Fluticasone propionate) .Marland Kitchen.. 1 spray each nostril once daily 7)  Tylenol Extra Strength 500 Mg Tabs (Acetaminophen) .... As needed. 8)  Sudafed Pe Maximum Strength 10 Mg Tabs (Phenylephrine hcl) .... As needed. 9)  Zithromax Z-pak 250 Mg Tabs (Azithromycin) .... 2 tabs by mouth today, then one tab by mouth x 4 more days 10)  Tessalon Perles 100 Mg Caps (Benzonatate) .... One cap by mouth  every 8 hours as needed for cough  Patient Instructions: 1)  Call if your symptoms worsen or if they do not improve. 2)  Please follow up in 3 months with Dr. Artist Pais. Prescriptions: WELLBUTRIN XL 150 MG XR24H-TAB (BUPROPION HCL) 3 tablets by mouth once daily (patient cannot tolerate generic - please give as written)  #270 x 0   Entered and Authorized by:   Lemont Fillers FNP   Signed by:   Lemont Fillers FNP on 09/30/2009   Method used:   Electronically to        [facility]* (retail)       [facility] [place], [place]  [address]       Ph: [phone]       Fax: [phone]   RxID:   [id number] NEURONTIN 300 MG CAPS (GABAPENTIN) one tablet by mouth 3 times a day  #90 x 0   Entered and Authorized by:   Lemont Fillers FNP   Signed by:   Lemont Fillers FNP on 09/30/2009   Method used:   Electronically to        [facility]* (retail)       [facility] [place], [place]  [address]       Ph: [phone]       Fax: [phone]   RxID:   [id number] TESSALON PERLES 100 MG CAPS (BENZONATATE) one cap by mouth every 8 hours as needed for cough  #30 x 0   Entered and Authorized by:   Lemont Fillers FNP   Signed by:   Lemont Fillers FNP on 09/30/2009   Method used:   Electronically to        [facility]* (retail)       [facility] [place], [place]  [address]       Ph: [phone]       Fax: [phone]   RxID:   [id number] ZITHROMAX Z-PAK 250 MG TABS (AZITHROMYCIN) 2 tabs by mouth today, then one tab by mouth x 4 more days  #1 pack x 0   Entered and Authorized by:   Lemont Fillers FNP   Signed by:   Lemont Fillers FNP on 09/30/2009   Method used:   Electronically to        [facility]* (retail)       [facility] [place], [place]  [address]       Ph: [phone]       Fax:  [phone]   RxID:   [id number]   Current Allergies (reviewed today): ! PCN

## 2010-07-08 NOTE — Letter (Signed)
Summary: [facility]  [facility]   Imported By: Lanelle Bal 03/18/2010 10:30:23  _____________________________________________________________________  External Attachment:    Type:   Image     Comment:   External Document

## 2010-07-08 NOTE — Letter (Signed)
   [place] at [facility] [address] [place], [place]  [address]  [place] Phone: [phone]      July 06, 2009   [facility] [place] [address] [place], [place] [address]  RE:  LAB RESULTS  Dear  Ms. GRIMMER,  The following is an interpretation of your most recent lab tests.  Please take note of any instructions provided or changes to medications that have resulted from your lab work.  ELECTROLYTES:  Good - no changes needed  KIDNEY FUNCTION TESTS:  Good - no changes needed    CBC:  Good - no changes needed   Sincerely Yours,    Lemont Fillers FNP

## 2010-09-09 LAB — PREGNANCY, URINE: Preg Test, Ur: NEGATIVE

## 2010-09-09 LAB — BASIC METABOLIC PANEL
BUN: 10 mg/dL (ref 6–23)
Calcium: 9.7 mg/dL (ref 8.4–10.5)
Creatinine, Ser: 0.94 mg/dL (ref 0.4–1.2)
GFR calc non Af Amer: >60 mL/min (ref >60)
Glucose, Bld: 84 mg/dL (ref 70–99)

## 2010-09-09 LAB — DIFFERENTIAL
Basophils Absolute: 0.1 10*3/uL (ref 0.0–0.1)
Eosinophils Relative: 1 % (ref 0–5)
Lymphocytes Relative: 29 % (ref 12–46)
Lymphs Abs: 1.8 10*3/uL (ref 0.7–4.0)
Neutrophils Relative %: 57 % (ref 43–77)

## 2010-09-09 LAB — CBC
HCT: 42.4 % (ref 36.0–46.0)
Platelets: 196 10*3/uL (ref 150–400)
RDW: 12.1 % (ref 11.5–15.5)
WBC: 6.1 10*3/uL (ref 4.0–10.5)

## 2010-09-29 ENCOUNTER — Telehealth: Payer: Self-pay | Admitting: Internal Medicine

## 2010-09-29 DIAGNOSIS — F329 Major depressive disorder, single episode, unspecified: Secondary | ICD-10-CM

## 2010-09-29 DIAGNOSIS — F3289 Other specified depressive episodes: Secondary | ICD-10-CM

## 2010-09-29 NOTE — Telephone Encounter (Signed)
Refill- wellbutrin xl 150mg  tablet. Take 3 tablets by mouth each day. Qty 270. Last fill 1.23.12

## 2010-09-29 NOTE — Telephone Encounter (Signed)
Ok to refill x 2  

## 2010-09-30 MED ORDER — BUPROPION HCL ER (XL) 150 MG PO TB24: 150.0000 mg | ORAL_TABLET | Freq: Every day | ORAL | Status: DC

## 2010-09-30 NOTE — Telephone Encounter (Signed)
Rx refill sent to pharmacy. 

## 2010-10-02 MED ORDER — BUPROPION HCL ER (XL) 150 MG PO TB24: 150.0000 mg | ORAL_TABLET | Freq: Three times a day (TID) | ORAL | Status: DC

## 2010-10-02 NOTE — Telephone Encounter (Signed)
Patient called and left voice message regarding the refill for her Wellbutrin, she stated the directions for the medication were once daily and she takes the Wellbutrin 3 times daily.   Call was placed to [facility], spoke with pharmacist she was informed of medication discrepancy regarding directions and quantity. Refill qty was sent for 90 day supply, however the directions read once daily wit refills. Pharmacist was informed patient is taking medication three times daily, and the Rx should be adjusted to reflect this. Pharmacist stated the correction would be noted.  Called returned to patient at [phone], she was made aware Rx correction was made to pharmacy. Patient stated that she has been contacted by the pharmacy and is aware.

## 2010-10-02 NOTE — Telephone Encounter (Signed)
Addended by: Glendell Docker on: 10/02/2010 05:16 PM   Modules accepted: Orders

## 2010-11-17 ENCOUNTER — Telehealth: Payer: Self-pay | Admitting: Family

## 2010-11-17 NOTE — Telephone Encounter (Signed)
OK to give 30 day supply, no refills. She will need follow up appointment before any further refills.

## 2010-11-17 NOTE — Telephone Encounter (Signed)
LUNESTA 2 MG QTY 30 TAKE 1 TABLET BY MOUTH AT BEDTIME LAST FILL 4=-29-11

## 2010-11-17 NOTE — Telephone Encounter (Signed)
Pt has no future appts on file. Last seen 03/03/10. Please advise if ok to give refill.

## 2010-11-18 MED ORDER — ESZOPICLONE 2 MG PO TABS: 2.0000 mg | ORAL_TABLET | Freq: Every evening | ORAL | Status: DC | PRN

## 2010-11-18 NOTE — Telephone Encounter (Signed)
Left refill authorization on pharmacy voicemail. Pt has f/u on 12/02/10.

## 2010-12-01 ENCOUNTER — Encounter: Payer: Self-pay | Admitting: Internal Medicine

## 2010-12-02 ENCOUNTER — Encounter: Payer: Self-pay | Admitting: Family

## 2010-12-02 ENCOUNTER — Ambulatory Visit (INDEPENDENT_AMBULATORY_CARE_PROVIDER_SITE_OTHER): Payer: PRIVATE HEALTH INSURANCE | Admitting: Family

## 2010-12-02 ENCOUNTER — Encounter: Payer: Self-pay | Admitting: Internal Medicine

## 2010-12-02 VITALS — BP 140/84 | HR 72 | Temp 98.2°F | Resp 16 | Ht 67.0 in | Wt 157.1 lb

## 2010-12-02 DIAGNOSIS — R110 Nausea: Secondary | ICD-10-CM

## 2010-12-02 DIAGNOSIS — R002 Palpitations: Secondary | ICD-10-CM

## 2010-12-02 DIAGNOSIS — Z0000 Encounter for general adult medical examination without abnormal findings: Secondary | ICD-10-CM

## 2010-12-02 LAB — TSH: TSH: 1.741 u[IU]/mL (ref 0.350–4.500)

## 2010-12-02 LAB — CBC WITH DIFFERENTIAL/PLATELET
HCT: 40 % (ref 36.0–46.0)
Hemoglobin: 13.1 g/dL (ref 12.0–15.0)
Lymphocytes Relative: 31 % (ref 12–46)
Lymphs Abs: 1.9 10*3/uL (ref 0.7–4.0)
Monocytes Absolute: 0.6 10*3/uL (ref 0.1–1.0)
Monocytes Relative: 10 % (ref 3–12)
Neutro Abs: 3.7 10*3/uL (ref 1.7–7.7)
Neutrophils Relative %: 58 % (ref 43–77)
RBC: 4.52 MIL/uL (ref 3.87–5.11)
WBC: 6.3 10*3/uL (ref 4.0–10.5)

## 2010-12-02 MED ORDER — OMEPRAZOLE 40 MG PO CPDR: 40.0000 mg | DELAYED_RELEASE_CAPSULE | Freq: Every day | ORAL | Status: DC

## 2010-12-02 MED ORDER — BUPROPION HCL ER (XL) 150 MG PO TB24: ORAL_TABLET | ORAL | Status: DC

## 2010-12-02 NOTE — Progress Notes (Signed)
  Subjective:    Patient ID: Pamela Sanders, female    DOB: [birth year], 45 y.o.   MRN: [id number]  HPI  Palpitations-  Had episode of hear racing for few seconds last week, mild sob, dizziness, denies associated chest pain. Happened again- but pulse was reportedly normal.  Notes that she didn't "feel right." Caffeine- 1-2 cups coffee in the AM.  One glass tea or soda at lunch.  Reports that she has had increased stress.  Work has been stressful- working many hours.   Nausea- reports that she has nausea if she has an empty stomach.  Improves with food.  Denies associated epigastric pain.  Denies vomitting.  Occasionally "a little shakey."  Denies gerd symptoms. Denies black or bloody stools.    Preventative-  She has paps completed by GYN- Debbora Dus,  [facility].  Up to date on mammogram.  Tetanus was in 2008.     Review of Systems  Constitutional: Negative for fever.  HENT: Negative for hearing loss.   Eyes: Negative for visual disturbance.  Respiratory: Negative for shortness of breath.   Cardiovascular: Negative for chest pain.  Gastrointestinal: Positive for nausea. Negative for vomiting.       Occasional diarrhea intermittent with constipation- neither severe.  Genitourinary: Negative for menstrual problem.  Musculoskeletal: Negative for myalgias and arthralgias.  Skin: Negative for rash.  Neurological:       HA's have been well controlled.  Hematological: Negative for adenopathy.  Psychiatric/Behavioral:       Depression is well controlled       Objective:   Physical Exam General: Awake, alert, and in no acute distress Head normocephalic/atraumatic ENT: Oropharynx is clear without erythema, oral mucosa is moist, bilateral tympanic membranes are intact without bulging or erythema Neck: Supple without JVD, thyromegaly, or cervical lymphadenopathy Breasts exam/GYN: Deferred to GYN Cardiovascular: S1, S2, regular rate and rhythm Respiratory: Breath sounds are clear to  auscultation bilaterally without wheezes rales or rhonchi Abdomen: Soft, nontender, nondistended Neuro: Moving all extremities, strength is 5 out of 5 bilateral upper extremity and bilateral lower extremity, positive facial symmetry is noted, EOMs are intact Psychiatric: Mildly anxious appearing female, well groomed, pleasant and appropriate.       Assessment & Plan:

## 2010-12-02 NOTE — Patient Instructions (Signed)
Please complete your lab work on the first floor today, also you can schedule your ultrasound on the first floor today. Return next week fasting for your cholesterol. Start prilosec, cut your wellbutrin down to 2 tabs. Follow up in 1 month.

## 2010-12-03 ENCOUNTER — Encounter: Payer: Self-pay | Admitting: Family

## 2010-12-03 DIAGNOSIS — R002 Palpitations: Secondary | ICD-10-CM | POA: Insufficient documentation

## 2010-12-03 DIAGNOSIS — Z0000 Encounter for general adult medical examination without abnormal findings: Secondary | ICD-10-CM | POA: Insufficient documentation

## 2010-12-03 DIAGNOSIS — R110 Nausea: Secondary | ICD-10-CM | POA: Insufficient documentation

## 2010-12-03 LAB — BASIC METABOLIC PANEL WITH GFR
BUN: 17 mg/dL (ref 6–23)
Chloride: 104 mEq/L (ref 96–112)
GFR, Est African American: >60 mL/min (ref >60)
GFR, Est Non African American: >60 mL/min (ref >60)
Potassium: 4.5 mEq/L (ref 3.5–5.3)
Sodium: 141 mEq/L (ref 135–145)

## 2010-12-03 LAB — HEPATIC FUNCTION PANEL
ALT: 20 U/L (ref 0–35)
AST: 30 U/L (ref 0–37)
Bilirubin, Direct: <0.1 mg/dL (ref 0.0–0.3)
Total Bilirubin: 0.2 mg/dL — ABNORMAL LOW (ref 0.3–1.2)

## 2010-12-03 NOTE — Assessment & Plan Note (Signed)
eKG performed today shows normal sinus rhythm. Will check TSH and CBC to exclude hyperthyroidism or anemia. I suspect however that stress is playing a role. We discussed today that should she have recurrent palpitations, we will refer her for Holter monitor.

## 2010-12-03 NOTE — Assessment & Plan Note (Signed)
Differential includes GERD, ulcer, cholelithiasis/cholecystitis, and pancreatitis. Will add empiric proton pump inhibitor, check lipase and liver function tests, and have patient complete an abdominal ultrasound to evaluate for cholecystitis. If no improvement in one month or should symptoms worsen we'll plan referral to gastroenterology for further evaluation.

## 2010-12-03 NOTE — Assessment & Plan Note (Signed)
Immunizations reviewed and up-to-date, patient was counseled on diet and exercise. Pap smear and mammogram are also up-to-date and are followed by GYN.

## 2010-12-05 ENCOUNTER — Ambulatory Visit (HOSPITAL_BASED_OUTPATIENT_CLINIC_OR_DEPARTMENT_OTHER)
Admission: RE | Admit: 2010-12-05 | Discharge: 2010-12-05 | Disposition: A | Discharge status: Home / Self Care | Payer: PRIVATE HEALTH INSURANCE | Source: Ambulatory Visit | Attending: Family | Admitting: Family

## 2010-12-05 ENCOUNTER — Telehealth: Payer: Self-pay | Admitting: Family

## 2010-12-05 DIAGNOSIS — R110 Nausea: Secondary | ICD-10-CM

## 2010-12-05 NOTE — Telephone Encounter (Signed)
Left message for patient to return my call.

## 2010-12-13 NOTE — Telephone Encounter (Signed)
Late entry- pt returned my call on 6/29.  We reviewed her lab work and ultrasound results.

## 2010-12-24 ENCOUNTER — Telehealth: Payer: Self-pay | Admitting: Internal Medicine

## 2010-12-24 NOTE — Telephone Encounter (Signed)
Refill- neurontin 300mg  caps-rds. Take one capsule by mouth three times a day. Qty 270. Last fill 4.23.12

## 2010-12-24 NOTE — Telephone Encounter (Signed)
OK to refill

## 2010-12-24 NOTE — Telephone Encounter (Signed)
Appt with Melissa on 01/05/11, is this OK to fill?  Please advise.

## 2010-12-25 MED ORDER — GABAPENTIN 300 MG PO CAPS: 300.0000 mg | ORAL_CAPSULE | Freq: Three times a day (TID) | ORAL | Status: DC

## 2010-12-25 NOTE — Telephone Encounter (Signed)
RX sent as last prescribed.

## 2011-01-05 ENCOUNTER — Encounter: Payer: Self-pay | Admitting: Family

## 2011-01-05 ENCOUNTER — Ambulatory Visit (INDEPENDENT_AMBULATORY_CARE_PROVIDER_SITE_OTHER): Payer: PRIVATE HEALTH INSURANCE | Admitting: Family

## 2011-01-05 DIAGNOSIS — R002 Palpitations: Secondary | ICD-10-CM

## 2011-01-05 DIAGNOSIS — F329 Major depressive disorder, single episode, unspecified: Secondary | ICD-10-CM

## 2011-01-05 DIAGNOSIS — R110 Nausea: Secondary | ICD-10-CM

## 2011-01-05 DIAGNOSIS — F3289 Other specified depressive episodes: Secondary | ICD-10-CM

## 2011-01-05 MED ORDER — BUPROPION HCL ER (XL) 150 MG PO TB24: ORAL_TABLET | ORAL | Status: DC

## 2011-01-05 MED ORDER — OMEPRAZOLE MAGNESIUM 20 MG PO TBEC: 20.0000 mg | DELAYED_RELEASE_TABLET | Freq: Every day | ORAL | Status: DC

## 2011-01-05 NOTE — Patient Instructions (Signed)
Please follow up in 3 months.  

## 2011-01-05 NOTE — Assessment & Plan Note (Signed)
Resolved, likely due to GERD symptoms.  Recommended that pt reduce omeprazole to 20mg  OTC dosing to see if this controls symptoms and improves side effect of diarrhea. If diarrhea persists, will plan to switch to zantac.

## 2011-01-05 NOTE — Assessment & Plan Note (Signed)
This is stable on reduced dose of wellbutrin. Continue same.

## 2011-01-05 NOTE — Assessment & Plan Note (Signed)
Resolved.  Suspect stress was playing a role.

## 2011-01-05 NOTE — Progress Notes (Signed)
Subjective:    Patient ID: Pamela Sanders, female    DOB: 10/07/1965, 45 y.o.   MRN: [id number]  HPI  Pamela Sanders is a 45 yr old female who presents today for follow up.    Palpitations- resolved.  No further issues.  Nausea- resolved.  She thinks that the prilosec is helping.  Notes that she does experience some diarrhea from the prilosec however.  Depression-  Has cut her wellbutrin now down to 300mg .  Feels that she is tolerating this dose well.  She hopes to ultimately come off of Wellbutrin in possible.  Review of Systems    see HPI  Past Medical History  Diagnosis Date  . Allergic rhinitis   . Depression (emotion)   . History of diverticulitis of colon   . GERD (gastroesophageal reflux disease)   . Osteoarthritis   . Migraine   . DJD (degenerative joint disease)     chronic neck pain    History   Social History  . Marital Status: Single    Spouse Name: N/A    Number of Children: N/A  . Years of Education: N/A   Occupational History  . Not on file.   Social History Main Topics  . Smoking status: Never Smoker   . Smokeless tobacco: Never Used  . Alcohol Use: Yes     2 drinks weekly  . Drug Use: No  . Sexually Active: Not on file   Other Topics Concern  . 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 Appendectomy   . Tmj arthroscopy     TMJ surgery  . Ovarian cyst surgery     abdominal surgery for ovarian cyst  . Cervical discectomy     Dr. Wyline Mood 06/2011[facility]    Family History  Problem Relation Age of Onset  . Arthritis    . Breast cancer    . Coronary artery disease Maternal Grandmother   . Hypertension      Allergies  Allergen Reactions  . Penicillins     Current Outpatient Prescriptions  on File Prior to Visit  Medication Sig Dispense Refill  . calcium citrate-vitamin D (CITRACAL+D) 315-200 MG-UNIT per tablet Take 1 tablet by mouth 2 (two) times daily.        . eszopiclone (LUNESTA) 2 MG TABS Take 1 tablet (2 mg total) by mouth at bedtime as needed. Take immediately before bedtime  30 tablet  0  . gabapentin (NEURONTIN) 300 MG capsule Take 1 capsule (300 mg total) by mouth 3 (three) times daily.  270 capsule  0  . loratadine (CLARITIN) 10 MG tablet Take 10 mg by mouth daily.        . Multiple Vitamins-Minerals (CENTRUM PO) Take by mouth daily.        . SUMAtriptan (IMITREX) 50 MG tablet Take 50 mg by mouth every 2 (two) hours as needed.          BP 100/80  Pulse 90  Temp(Src) 97.8 F (36.6 C) (Oral)  Resp 18  Ht 5\' 7"  (1.702 m)  Wt 163 lb (73.936 kg)  BMI 25.53 kg/m2  LMP 12/01/2010    Objective:   Physical Exam  Constitutional: She appears well-developed and well-nourished.  HENT:  Head: Normocephalic and atraumatic.  Cardiovascular: Normal rate and regular rhythm.   Pulmonary/Chest: Effort normal and breath sounds normal.  Abdominal: Soft. Bowel sounds are normal. She exhibits no distension. There is no tenderness.  Psychiatric: She has a normal mood and affect. Her behavior is normal. Judgment and thought content normal.          Assessment & Plan:   No problem-specific assessment & plan notes found for this encounter.

## 2011-03-06 ENCOUNTER — Ambulatory Visit (INDEPENDENT_AMBULATORY_CARE_PROVIDER_SITE_OTHER): Payer: PRIVATE HEALTH INSURANCE | Admitting: Family

## 2011-03-06 ENCOUNTER — Ambulatory Visit (HOSPITAL_BASED_OUTPATIENT_CLINIC_OR_DEPARTMENT_OTHER)
Admission: RE | Admit: 2011-03-06 | Discharge: 2011-03-06 | Disposition: A | Discharge status: Home / Self Care | Payer: PRIVATE HEALTH INSURANCE | Source: Ambulatory Visit | Attending: Family | Admitting: Family

## 2011-03-06 ENCOUNTER — Encounter: Payer: Self-pay | Admitting: Family

## 2011-03-06 DIAGNOSIS — R109 Unspecified abdominal pain: Secondary | ICD-10-CM

## 2011-03-06 DIAGNOSIS — R1031 Right lower quadrant pain: Secondary | ICD-10-CM | POA: Insufficient documentation

## 2011-03-06 DIAGNOSIS — N83209 Unspecified ovarian cyst, unspecified side: Secondary | ICD-10-CM

## 2011-03-06 LAB — CBC WITH DIFFERENTIAL/PLATELET
Basophils Absolute: 0 10*3/uL (ref 0.0–0.1)
Basophils Relative: 0 % (ref 0–1)
Eosinophils Absolute: 0.1 10*3/uL (ref 0.0–0.7)
Eosinophils Relative: 1 % (ref 0–5)
Lymphs Abs: 2.1 10*3/uL (ref 0.7–4.0)
MCH: 28.8 pg (ref 26.0–34.0)
MCHC: 33.1 g/dL (ref 30.0–36.0)
MCV: 87.1 fL (ref 78.0–100.0)
Neutrophils Relative %: 54 % (ref 43–77)
Platelets: 227 10*3/uL (ref 150–400)
RBC: 4.58 MIL/uL (ref 3.87–5.11)
RDW: 12.2 % (ref 11.5–15.5)

## 2011-03-06 MED ORDER — IOHEXOL 300 MG/ML  SOLN
100.0000 mL | Freq: Once | INTRAMUSCULAR | Status: AC | PRN
  Administered 2011-03-06: 100 mL via INTRAVENOUS

## 2011-03-06 MED ORDER — METRONIDAZOLE 500 MG PO TABS: 500.0000 mg | ORAL_TABLET | Freq: Three times a day (TID) | ORAL | Status: AC

## 2011-03-06 MED ORDER — CIPROFLOXACIN HCL 500 MG PO TABS: 500.0000 mg | ORAL_TABLET | Freq: Two times a day (BID) | ORAL | Status: AC

## 2011-03-06 NOTE — Assessment & Plan Note (Signed)
Reviewed CT abdomen, this is negative for diverticulitis.  Notes R corpus luteal cyst.  Reviewed results with pt by phone and recommended that she follow up with her GYN.  Advised her not to start antibiotics, call if symptoms worsen, advance diet as tolerated.

## 2011-03-06 NOTE — Patient Instructions (Signed)
Please follow up in 1 week.

## 2011-03-06 NOTE — Progress Notes (Signed)
Subjective:    Patient ID: Pamela Sanders, female    DOB: [birth year], 45 y.o.   MRN: [id number]  HPI  [name] is a 45 year old female who presents with 1 week history of lower abdominal pain.  Worse with eating, denies associated nausea.  Stool is loose. She is s/p appendectomy. Denies dysuria or fever.  Denies black or bloody stools but notes that the stools are dark.  She reports hx of diverticulitis about 7 years ago.     Review of Systems See HPI  Past Medical History  Diagnosis Date  . Allergic rhinitis   . Depression (emotion)   . History of diverticulitis of colon   . GERD (gastroesophageal reflux disease)   . Osteoarthritis   . Migraine   . DJD (degenerative joint disease)     chronic neck pain    History   Social History  . Marital Status: Single    Spouse Name: N/A    Number of Children: N/A  . Years of Education: N/A   Occupational History  . Not on file.   Social History Main Topics  . Smoking status: Never Smoker   . Smokeless tobacco: Never Used  . Alcohol Use: Yes     2 drinks weekly  . Drug Use: No  . Sexually Active: Not on file   Other Topics Concern  . 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 Appendectomy   . Tmj arthroscopy     TMJ surgery  . Ovarian cyst surgery     abdominal surgery for ovarian cyst  . Cervical discectomy     Dr. Wyline Mood 06/2011[facility]    Family History  Problem Relation Age of Onset  . Arthritis    . Breast cancer    . Coronary artery disease Maternal Grandmother   . Hypertension      Allergies  Allergen Reactions  . Penicillins     Current Outpatient Prescriptions on File Prior to Visit  Medication Sig Dispense Refill  . buPROPion (WELLBUTRIN XL)  150 MG 24 hr tablet 2 tabs once daily  30 tablet  0  . calcium citrate-vitamin D (CITRACAL+D) 315-200 MG-UNIT per tablet Take 1 tablet by mouth 2 (two) times daily.        . eszopiclone (LUNESTA) 2 MG TABS Take 1 tablet (2 mg total) by mouth at bedtime as needed. Take immediately before bedtime  30 tablet  0  . loratadine (CLARITIN) 10 MG tablet Take 10 mg by mouth daily.        . Multiple Vitamins-Minerals (CENTRUM PO) Take by mouth daily.        Marland Kitchen omeprazole (PRILOSEC OTC) 20 MG tablet Take 1 tablet (20 mg total) by mouth daily.  28 tablet  1  . SUMAtriptan (IMITREX) 50 MG tablet Take 50 mg by mouth every 2 (two) hours as needed.          BP 106/70  Pulse 72  Temp(Src) 98.3 F (36.8 C) (Oral)  Resp 16  Ht 5\' 7"  (1.702 m)  Wt 158 lb 0.6 oz (71.686 kg)  BMI 24.75 kg/m2       Objective:   Physical Exam  Constitutional: She appears  well-developed and well-nourished.  HENT:  Head: Normocephalic and atraumatic.  Neck: Normal range of motion. Neck supple.  Cardiovascular: Normal rate and regular rhythm.   No murmur heard. Pulmonary/Chest: Effort normal and breath sounds normal. No respiratory distress. She has no wheezes. She has no rales. She exhibits no tenderness.  Abdominal: Soft. Bowel sounds are normal. She exhibits no distension. There is tenderness. There is no rebound and no guarding.       Mild right lower quadrant tenderness.  Skin: Skin is warm and dry. She is not diaphoretic.          Assessment & Plan:

## 2011-03-09 ENCOUNTER — Telehealth: Payer: Self-pay | Admitting: *Deleted

## 2011-03-09 NOTE — Telephone Encounter (Signed)
CT was faxed to number below and pt has been notified.

## 2011-03-09 NOTE — Telephone Encounter (Signed)
Received call from pt requesting that we fax copy of CT from 03/06/11 to [facility] Attention: Debbora Dus Fax [phone].

## 2011-03-23 ENCOUNTER — Telehealth: Payer: Self-pay | Admitting: Family

## 2011-03-23 NOTE — Telephone Encounter (Signed)
Refill- lunesta 2mg . Take one tablet by mouth at bedtime as needed--call for appointment. Qty 30. Last fill 6.12.12

## 2011-03-23 NOTE — Telephone Encounter (Signed)
Is it okay to refill Lunesta for this patient?

## 2011-03-24 MED ORDER — ESZOPICLONE 2 MG PO TABS: 2.0000 mg | ORAL_TABLET | Freq: Every evening | ORAL | Status: DC | PRN

## 2011-03-24 NOTE — Telephone Encounter (Signed)
OK to send #30 with no refills. 

## 2011-03-24 NOTE — Telephone Encounter (Signed)
Refill left on pharmacy voicemail. 

## 2011-04-28 ENCOUNTER — Other Ambulatory Visit: Payer: Self-pay | Admitting: *Deleted

## 2011-04-28 MED ORDER — GABAPENTIN 300 MG PO CAPS: 300.0000 mg | ORAL_CAPSULE | Freq: Two times a day (BID) | ORAL | Status: DC

## 2011-04-28 MED ORDER — BUPROPION HCL ER (XL) 150 MG PO TB24: ORAL_TABLET | ORAL | Status: DC

## 2011-04-28 NOTE — Telephone Encounter (Signed)
Received call from pt requesting 90 day supply on Wellbutrin XL and neurontin. Refills sent to pharmacy #160 x no refills each. Detailed message left on pt's voicemail re: refill completion.

## 2011-05-14 ENCOUNTER — Encounter: Payer: Self-pay | Admitting: Internal Medicine

## 2011-05-14 ENCOUNTER — Ambulatory Visit (INDEPENDENT_AMBULATORY_CARE_PROVIDER_SITE_OTHER): Payer: PRIVATE HEALTH INSURANCE | Admitting: Internal Medicine

## 2011-05-14 VITALS — BP 102/68 | HR 77 | Temp 98.2°F | Resp 18 | Wt 159.0 lb

## 2011-05-14 DIAGNOSIS — J069 Acute upper respiratory infection, unspecified: Secondary | ICD-10-CM

## 2011-05-14 DIAGNOSIS — J988 Other specified respiratory disorders: Secondary | ICD-10-CM | POA: Insufficient documentation

## 2011-05-14 MED ORDER — AZITHROMYCIN 250 MG PO TABS: ORAL_TABLET | ORAL | Status: AC

## 2011-05-14 NOTE — Progress Notes (Signed)
  Subjective:    Patient ID: Pamela Sanders, female    DOB: May 29, 1966, 45 y.o.   MRN: [id number]  HPI Pt presents to clinic for evaluation of cough. Notes 5d h/o cough productive for yellow sputum, nasal and chest congestion as well as frontal headache. No fever or chills. Taking otc medication without significant improvement. No other alleviating or exacerbating factors. No other complaints.  Past Medical History  Diagnosis Date  . Allergic rhinitis   . Depression (emotion)   . History of diverticulitis of colon   . GERD (gastroesophageal reflux disease)   . Osteoarthritis   . Migraine   . DJD (degenerative joint disease)     chronic neck pain   Past Surgical History  Procedure Date  . Appendectomy   . Tmj arthroscopy     TMJ surgery  . Ovarian cyst surgery     abdominal surgery for ovarian cyst  . Cervical discectomy     Dr. Wyline Mood 06/2011[facility]    reports that she has never smoked. She has never used smokeless tobacco. She reports that she drinks alcohol. She reports that she does not use illicit drugs. family history includes Arthritis in an unspecified family member; Breast cancer in an unspecified family member; Coronary artery disease in her maternal grandmother; and Hypertension in an unspecified family member. Allergies  Allergen Reactions  . Penicillins      Review of Systems see hpi     Objective:   Physical Exam  Nursing note and vitals reviewed. Constitutional: She appears well-developed and well-nourished. No distress.  HENT:  Head: Normocephalic and atraumatic.  Right Ear: Tympanic membrane, external ear and ear canal normal.  Left Ear: Tympanic membrane, external ear and ear canal normal.  Nose: Nose normal.  Mouth/Throat: Uvula is midline, oropharynx is clear and moist and mucous membranes are normal. No oropharyngeal exudate, posterior oropharyngeal edema, posterior oropharyngeal erythema or tonsillar abscesses.  Eyes: Conjunctivae are normal. Right  eye exhibits no discharge. Left eye exhibits no discharge. No scleral icterus.  Neck: Neck supple.  Cardiovascular: Normal rate, regular rhythm and normal heart sounds.   Pulmonary/Chest: Effort normal and breath sounds normal. No respiratory distress. She has no wheezes. She has no rales.  Lymphadenopathy:    She has no cervical adenopathy.  Neurological: She is alert.  Skin: Skin is warm and dry. She is not diaphoretic.  Psychiatric: She has a normal mood and affect.          Assessment & Plan:

## 2011-05-14 NOTE — Assessment & Plan Note (Signed)
Continue otc medication prn. Given printed abx to hold. Begin abx if sx's do not improve after total duration of 8-10 days. Followup if no improvement or worsening.

## 2011-07-13 ENCOUNTER — Telehealth: Payer: Self-pay | Admitting: *Deleted

## 2011-07-13 DIAGNOSIS — G43909 Migraine, unspecified, not intractable, without status migrainosus: Secondary | ICD-10-CM

## 2011-07-13 NOTE — Telephone Encounter (Signed)
Notified pt and advised her to call us back if she hasn't received call re: referral by Friday.

## 2011-07-13 NOTE — Telephone Encounter (Signed)
I will set her up to see Dr. Denton Meek- neurologist with [facility].

## 2011-07-13 NOTE — Telephone Encounter (Signed)
Received voice message from pt stating she has had an increase in the frequency of her migraines. She has been seeing Neurology in [place] and has had some med changes without relief of symptoms. States she is having a difficult time getting a response from her current neurologist and would like to pursue care somewhere else. Wants to know if you would recommend / refer her to someone different? Please advise.

## 2011-07-14 ENCOUNTER — Encounter: Payer: Self-pay | Admitting: Neurology

## 2011-07-27 ENCOUNTER — Telehealth: Payer: Self-pay | Admitting: Internal Medicine

## 2011-07-27 MED ORDER — BUPROPION HCL ER (XL) 150 MG PO TB24: ORAL_TABLET | ORAL | Status: DC

## 2011-07-27 MED ORDER — GABAPENTIN 300 MG PO CAPS: 300.0000 mg | ORAL_CAPSULE | Freq: Two times a day (BID) | ORAL | Status: DC

## 2011-07-27 NOTE — Telephone Encounter (Signed)
Refills sent to pharmacy for Wellbutrin and Gabapentin #180 x no refills each.

## 2011-08-03 ENCOUNTER — Encounter: Payer: Self-pay | Admitting: Family

## 2011-08-03 ENCOUNTER — Ambulatory Visit (INDEPENDENT_AMBULATORY_CARE_PROVIDER_SITE_OTHER): Payer: PRIVATE HEALTH INSURANCE | Admitting: Family

## 2011-08-03 VITALS — BP 100/70 | HR 104 | Temp 98.3°F | Resp 16 | Wt 160.1 lb

## 2011-08-03 DIAGNOSIS — J069 Acute upper respiratory infection, unspecified: Secondary | ICD-10-CM

## 2011-08-03 DIAGNOSIS — M791 Myalgia, unspecified site: Secondary | ICD-10-CM

## 2011-08-03 DIAGNOSIS — J029 Acute pharyngitis, unspecified: Secondary | ICD-10-CM

## 2011-08-03 DIAGNOSIS — IMO0001 Reserved for inherently not codable concepts without codable children: Secondary | ICD-10-CM

## 2011-08-03 LAB — POCT INFLUENZA A/B
Influenza A, POC: NEGATIVE
Influenza B, POC: NEGATIVE

## 2011-08-03 LAB — POCT RAPID STREP A (OFFICE): Rapid Strep A Screen: NEGATIVE

## 2011-08-03 MED ORDER — GABAPENTIN 300 MG PO CAPS: 300.0000 mg | ORAL_CAPSULE | Freq: Two times a day (BID) | ORAL | Status: DC

## 2011-08-03 MED ORDER — HYDROCOD POLST-CHLORPHEN POLST 10-8 MG/5ML PO LQCR: 5.0000 mL | Freq: Two times a day (BID) | ORAL | Status: DC | PRN

## 2011-08-03 NOTE — Patient Instructions (Signed)
Please call if symptoms worsen, or if you are not feeling better in 2-3 days.  

## 2011-08-03 NOTE — Assessment & Plan Note (Signed)
Rapid strep negative. Rapid flu is negative.  Recommended supportive measures, call if symptoms worsen or if not improved in 2-3 days.  Pt verbalizes understanding.  Rx provided for tussionex prn cough- she is aware that this could cause drowsiness and not to drive while using this medication.

## 2011-08-03 NOTE — Progress Notes (Signed)
Subjective:    Patient ID: Pamela Sanders, female    DOB: 1965/12/23, 46 y.o.   MRN: [id number]  HPI  Ms.  Paddock is a 46 yr old female who presents today with chief complaint of nasal congestion.  Symptoms started 2 days ago and are associated with low grade fever 99-100, myalgias, "severe" sore throat, mild nausea.  She also has associated right ear pain.  She reports that she did receive flu shot this season. Has tried otc cold prep without improvement.  Recently traveled to [place] on an airplane and notes that she stayed in a home that was poorly insulated.       Review of Systems See HPI  Past Medical History  Diagnosis Date  . Allergic rhinitis   . Depression (emotion)   . History of diverticulitis of colon   . GERD (gastroesophageal reflux disease)   . Osteoarthritis   . Migraine   . DJD (degenerative joint disease)     chronic neck pain    History   Social History  . Marital Status: Single    Spouse Name: N/A    Number of Children: N/A  . Years of Education: N/A   Occupational History  . Not on file.   Social History Main Topics  . Smoking status: Never Smoker   . Smokeless tobacco: Never Used  . Alcohol Use: Yes     2 drinks weekly  . Drug Use: No  . Sexually Active: Not on file   Other Topics Concern  . 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 Appendectomy   . Tmj arthroscopy     TMJ surgery  . Ovarian cyst surgery     abdominal surgery for ovarian cyst  . Cervical discectomy     Dr. Wyline Mood 06/2011[facility]    Family History  Problem Relation Age of Onset  . Arthritis    . Breast cancer    . Coronary artery disease Maternal Grandmother   . Hypertension      Allergies  Allergen Reactions  .  Penicillins     Current Outpatient Prescriptions on File Prior to Visit  Medication Sig Dispense Refill  . buPROPion (WELLBUTRIN XL) 150 MG 24 hr tablet 2 tabs once daily  180 tablet  0  . calcium citrate-vitamin D (CITRACAL+D) 315-200 MG-UNIT per tablet Take 1 tablet by mouth 2 (two) times daily.        . eszopiclone (LUNESTA) 2 MG TABS Take 1 tablet (2 mg total) by mouth at bedtime as needed. Take immediately before bedtime  30 tablet  0  . loratadine (CLARITIN) 10 MG tablet Take 10 mg by mouth daily.        . Multiple Vitamins-Minerals (CENTRUM PO) Take by mouth daily.        Marland Kitchen omeprazole (PRILOSEC OTC) 20 MG tablet Take 1 tablet (20 mg total) by mouth daily.  28 tablet  1  . SUMAtriptan (IMITREX) 50 MG tablet Take 50 mg by mouth every 2 (two) hours as needed.          BP 100/70  Pulse 104  Temp(Src) 98.3 F (36.8 C) (Oral)  Resp 16  Wt 160 lb 1.9 oz (72.63 kg)  SpO2 98%       Objective:   Physical Exam  Constitutional: She appears well-developed and well-nourished. No distress.  HENT:  Head: Normocephalic and atraumatic.  Right Ear: Ear canal normal.  Left Ear: Ear canal normal.  Mouth/Throat: Posterior oropharyngeal erythema present. No oropharyngeal exudate or posterior oropharyngeal edema.       Bilateral TM's dull without erythema or bulging.  Cardiovascular: Normal rate and regular rhythm.   No murmur heard. Pulmonary/Chest: Effort normal and breath sounds normal. No respiratory distress. She has no wheezes. She has no rales. She exhibits no tenderness.  Psychiatric: She has a normal mood and affect. Her behavior is normal. Judgment and thought content normal.          Assessment & Plan:

## 2011-08-05 ENCOUNTER — Telehealth: Payer: Self-pay | Admitting: *Deleted

## 2011-08-05 MED ORDER — AZITHROMYCIN 250 MG PO TABS: ORAL_TABLET | ORAL | Status: AC

## 2011-08-05 NOTE — Telephone Encounter (Signed)
Left message on voicemail to return my call.  

## 2011-08-05 NOTE — Telephone Encounter (Signed)
Notified pt. She is already taking Mucinex and will start abx. Rx sent to [facility] at pt's request.

## 2011-08-05 NOTE — Telephone Encounter (Signed)
I recommend that she try mucinex 600mg  twice daily.  Also, rx pended for zpak- please verify pharmacy that she would like it sent to.

## 2011-08-05 NOTE — Telephone Encounter (Signed)
Received message from pt stating she has run a fever around 100 yesterday. Has "extreme chest congestion" that is very difficult to break up. Wants to know if she needs antibiotic? Please advise.

## 2011-08-06 ENCOUNTER — Other Ambulatory Visit: Payer: Self-pay | Admitting: *Deleted

## 2011-08-06 MED ORDER — GABAPENTIN 300 MG PO CAPS: 300.0000 mg | ORAL_CAPSULE | Freq: Two times a day (BID) | ORAL | Status: DC

## 2011-08-06 NOTE — Telephone Encounter (Signed)
Received call from pt stating she was unable to return to work today, is planning to go back tomorrow. She has not been able to pick up her monthly supply of gabapentin as she fills it at work in [place]. Pt is requesting #4 to be sent to local [facility] until she can pick up other refill. Refill sent to [facility], pt aware.

## 2011-08-31 ENCOUNTER — Ambulatory Visit: Payer: PRIVATE HEALTH INSURANCE | Admitting: Neurology

## 2011-10-02 ENCOUNTER — Other Ambulatory Visit: Payer: Self-pay | Admitting: Orthopedic Surgery

## 2011-10-02 ENCOUNTER — Ambulatory Visit
Admission: RE | Admit: 2011-10-02 | Discharge: 2011-10-02 | Disposition: A | Discharge status: Home / Self Care | Payer: PRIVATE HEALTH INSURANCE | Source: Ambulatory Visit | Attending: Orthopedic Surgery | Admitting: Orthopedic Surgery

## 2011-10-02 DIAGNOSIS — R52 Pain, unspecified: Secondary | ICD-10-CM

## 2011-10-12 ENCOUNTER — Ambulatory Visit: Payer: PRIVATE HEALTH INSURANCE | Admitting: Neurology

## 2011-11-03 ENCOUNTER — Other Ambulatory Visit: Payer: Self-pay | Admitting: Family

## 2011-12-11 ENCOUNTER — Telehealth: Payer: Self-pay | Admitting: *Deleted

## 2011-12-11 ENCOUNTER — Encounter (HOSPITAL_COMMUNITY): Payer: Self-pay

## 2011-12-11 ENCOUNTER — Emergency Department (HOSPITAL_COMMUNITY)
Admission: EM | Admit: 2011-12-11 | Discharge: 2011-12-11 | Disposition: A | Discharge status: Home / Self Care | Payer: PRIVATE HEALTH INSURANCE | Source: Home / Self Care | Attending: Emergency Medicine | Admitting: Emergency Medicine

## 2011-12-11 DIAGNOSIS — M461 Sacroiliitis, not elsewhere classified: Secondary | ICD-10-CM

## 2011-12-11 MED ORDER — KETOROLAC TROMETHAMINE 30 MG/ML IJ SOLN
30.0000 mg | Freq: Once | INTRAMUSCULAR | Status: AC
  Administered 2011-12-11: 30 mg via INTRAMUSCULAR

## 2011-12-11 MED ORDER — HYDROCODONE-ACETAMINOPHEN 5-500 MG PO TABS: 1.0000 | ORAL_TABLET | Freq: Four times a day (QID) | ORAL | Status: AC | PRN

## 2011-12-11 MED ORDER — PREDNISONE 10 MG PO TABS: 20.0000 mg | ORAL_TABLET | Freq: Every day | ORAL | Status: AC

## 2011-12-11 MED ORDER — KETOROLAC TROMETHAMINE 30 MG/ML IJ SOLN: 30.0000 mg | Freq: Once | INTRAMUSCULAR | Status: DC

## 2011-12-11 MED ORDER — PREDNISONE 10 MG PO TABS: 20.0000 mg | ORAL_TABLET | Freq: Every day | ORAL | Status: DC

## 2011-12-11 MED ORDER — HYDROCODONE-ACETAMINOPHEN 5-325 MG PO TABS: 2.0000 | ORAL_TABLET | Freq: Once | ORAL | Status: DC

## 2011-12-11 MED ORDER — HYDROCODONE-ACETAMINOPHEN 5-500 MG PO TABS: 1.0000 | ORAL_TABLET | Freq: Four times a day (QID) | ORAL | Status: DC | PRN

## 2011-12-11 NOTE — ED Notes (Signed)
C/o pain in low sacral area since 7-3 PM; her boss (MD) told her she probably has sacroilitis ; painful to sit for long periods ; denies injury , or fall; no relief w NSAID's or flexeril

## 2011-12-11 NOTE — ED Provider Notes (Signed)
History     CSN: [id number]  Arrival date & time 12/11/11  1827   First MD Initiated Contact with Patient 12/11/11 1831      Chief Complaint  Patient presents with  . Back Pain    (Consider location/radiation/quality/duration/timing/severity/associated sxs/prior treatment) HPI Comments: Patient presents urgent care complaining of lower left-sided back pain for approximately 3 days. Pain is much worse when she sits for long periods of time. Patient denies any recent fall injuries or further symptoms such as numbness, tingling, lower extremity weakness, urinary symptoms, or abdominal pain.  Vision also denies any constitutional symptoms such as fevers, unintentional weight loss, changes in appetite. Patient also denies any further arthralgias or myalgias. Patient describes taking some more sick but is started irritating her stomach a bit so she stopped. Also took some Tylenol with minor improvement.  Patient is a 46 y.o. female presenting with back pain. The history is provided by the patient.  Back Pain  This is a new problem. The current episode started 2 days ago. The problem occurs constantly. The problem has been gradually worsening. The pain is associated with no known injury. The pain is present in the sacro-iliac joint. The quality of the pain is described as aching. The pain does not radiate. The pain is at a severity of 8/10. The pain is moderate. The symptoms are aggravated by bending. Stiffness is present all day. Pertinent negatives include no fever, no weight loss, no abdominal pain, no bladder incontinence, no dysuria, no pelvic pain, no paresthesias, no paresis, no tingling and no weakness. She has tried NSAIDs for the symptoms. The treatment provided no relief.    Past Medical History  Diagnosis Date  . Allergic rhinitis   . Depression (emotion)   . History of diverticulitis of colon   . GERD (gastroesophageal reflux disease)   . Osteoarthritis   . Migraine   . DJD  (degenerative joint disease)     chronic neck pain    Past Surgical History  Procedure Date  . Appendectomy   . Tmj arthroscopy     TMJ surgery  . Ovarian cyst surgery     abdominal surgery for ovarian cyst  . Cervical discectomy     Dr. Wyline Mood 06/2011[facility]    Family History  Problem Relation Age of Onset  . Arthritis    . Breast cancer    . Coronary artery disease Maternal Grandmother   . Hypertension      History  Substance Use Topics  . Smoking status: Never Smoker   . Smokeless tobacco: Never Used  . Alcohol Use: Yes     2 drinks weekly    OB History    Grav Para Term Preterm Abortions TAB SAB Ect Mult Living                  Review of Systems  Constitutional: Positive for activity change. Negative for fever, chills, weight loss, diaphoresis, appetite change and fatigue.  Gastrointestinal: Negative for abdominal pain.  Genitourinary: Negative for bladder incontinence, dysuria and pelvic pain.  Musculoskeletal: Positive for back pain. Negative for joint swelling and gait problem.  Skin: Negative for color change and wound.  Neurological: Negative for dizziness, tingling, weakness and paresthesias.    Allergies  Penicillins  Home Medications   Current Outpatient Rx  Name Route Sig Dispense Refill  . GABAPENTIN 300 MG PO CAPS Oral Take 1 capsule (300 mg total) by mouth 2 (two) times daily. 4 capsule 0  .  LORATADINE 10 MG PO TABS Oral Take 10 mg by mouth daily.      . CENTRUM PO Oral Take by mouth daily.      Marland Kitchen OMEPRAZOLE MAGNESIUM 20 MG PO TBEC Oral Take 1 tablet (20 mg total) by mouth daily. 28 tablet 1  . WELLBUTRIN XL 150 MG PO TB24  TAKE 2 TABLETS ONCE DAILY 180 tablet 0    Please dispense BRAND NAME.  Marland Kitchen CALCIUM CITRATE-VITAMIN D 315-200 MG-UNIT PO TABS Oral Take 1 tablet by mouth 2 (two) times daily.      Marland Kitchen HYDROCOD POLST-CPM POLST ER 10-8 MG/5ML PO LQCR Oral Take 5 mLs by mouth every 12 (twelve) hours as needed. 140 mL 0  . ESZOPICLONE 2 MG PO  TABS Oral Take 1 tablet (2 mg total) by mouth at bedtime as needed. Take immediately before bedtime 30 tablet 0  . HYDROCODONE-ACETAMINOPHEN 5-500 MG PO TABS Oral Take 1-2 tablets by mouth every 6 (six) hours as needed for pain. 15 tablet 0  . KETOROLAC TROMETHAMINE 30 MG/ML IJ SOLN Intramuscular Inject 1 mL (30 mg total) into the muscle once. 1 mL 0  . PREDNISONE 10 MG PO TABS Oral Take 2 tablets (20 mg total) by mouth daily. 15 tablet 0  . SUMATRIPTAN SUCCINATE 50 MG PO TABS Oral Take 50 mg by mouth every 2 (two) hours as needed.        BP 137/88  Pulse 73  Temp 97.6 F (36.4 C) (Oral)  Resp 18  SpO2 98%  LMP 11/20/2011  Physical Exam  Nursing note and vitals reviewed. Constitutional: She appears well-developed. She appears distressed.  HENT:  Head: Normocephalic.  Eyes: Conjunctivae are normal.  Musculoskeletal: She exhibits tenderness.       Lumbar back: She exhibits decreased range of motion, tenderness and pain. She exhibits no bony tenderness, no swelling, no edema, no deformity, no laceration, no spasm and normal pulse.       Back:  Neurological: She is alert.  Skin: No rash noted. No erythema.    ED Course  Procedures (including critical care time)  Labs Reviewed - No data to display No results found.   1. Sacroiliitis    Toradol shot for pain control while at urgent care. Prescriptions for Lortab and prednisone for 5 days where prescribed today.   MDM  Sacroiliitis ( left ) No recent injury or trauma, no neurological or muscular deficits on exam. Urged patient to take cycle of prednisone along with narcotic pain management for the next 5 days. Encouraged to followup with the orthopedic Dr. or to call sports medicine clinic for further treatment and pain was to continue. Patient understood treatment plan agreed with follow-up care as necessary.          Jimmie Molly, MD 12/11/11 2041

## 2011-12-11 NOTE — Telephone Encounter (Signed)
Pt reports low back and buttock pain mostly on left side. Hurts to sit. Pain is 7-8 out of 10, tylenol not helping. Pain is constant, sharp pain with turning, bending and sitting. Pain is worse since last night. Notified Provider and advised pt be seen in Urgent Care this evening.

## 2011-12-18 ENCOUNTER — Encounter: Payer: Self-pay | Admitting: Family

## 2011-12-18 ENCOUNTER — Telehealth (HOSPITAL_COMMUNITY): Payer: Self-pay | Admitting: *Deleted

## 2011-12-18 ENCOUNTER — Ambulatory Visit (INDEPENDENT_AMBULATORY_CARE_PROVIDER_SITE_OTHER): Payer: PRIVATE HEALTH INSURANCE | Admitting: Family

## 2011-12-18 VITALS — BP 128/90 | HR 63 | Temp 98.2°F | Resp 16 | Wt 161.1 lb

## 2011-12-18 DIAGNOSIS — G43009 Migraine without aura, not intractable, without status migrainosus: Secondary | ICD-10-CM

## 2011-12-18 DIAGNOSIS — M542 Cervicalgia: Secondary | ICD-10-CM

## 2011-12-18 DIAGNOSIS — M461 Sacroiliitis, not elsewhere classified: Secondary | ICD-10-CM

## 2011-12-18 MED ORDER — ESZOPICLONE 2 MG PO TABS: 2.0000 mg | ORAL_TABLET | Freq: Every evening | ORAL | Status: DC | PRN

## 2011-12-18 NOTE — ED Notes (Signed)
Pt. called on VM and asked if she needs a prednisone taper or just take 2 daily as written. I called back and told pt. to take it as written.  She said she called her PCP and he told her the same.  Has one left. States she is feeling better. Vassie Moselle 12/18/2011

## 2011-12-18 NOTE — Progress Notes (Signed)
Subjective:    Patient ID: Pamela Sanders, female    DOB: 1965-11-06, 46 y.o.   MRN: [id number]  HPI  Hip pain- pt was seen last week at urgent care on 7/5 (records are reviewed) and was treated for sacroilitis with a prednisone and vicodin. She reports that her pain has improved.   Migraine- Seeing HA specialist at [facility], she will be receiving botox. Recommended PT due to tension.  Mid June - crook in neck.    Some right elbow pain- ortho.  R neck pain- she will follow up with her Neurosurgeon next week to discuss.    Review of Systems  See HPI      Past Medical History  Diagnosis Date  . Allergic rhinitis   . Depression (emotion)   . History of diverticulitis of colon   . GERD (gastroesophageal reflux disease)   . Osteoarthritis   . Migraine   . DJD (degenerative joint disease)     chronic neck pain    History   Social History  . Marital Status: Single    Spouse Name: N/A    Number of Children: N/A  . Years of Education: N/A   Occupational History  . Not on file.   Social History Main Topics  . Smoking status: Never Smoker   . Smokeless tobacco: Never Used  . Alcohol Use: Yes     2 drinks weekly  . Drug Use: No  . Sexually Active: Not on file   Other Topics Concern  . 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 Appendectomy   . Tmj arthroscopy     TMJ surgery  . Ovarian cyst surgery     abdominal surgery for ovarian cyst  . Cervical discectomy     Dr. Wyline Mood 06/2011[facility]    Family History  Problem Relation Age of Onset  . Arthritis    . Breast cancer    . Coronary artery disease Maternal Grandmother   . Hypertension      Allergies  Allergen Reactions  . Penicillins     Current  Outpatient Prescriptions on File Prior to Visit  Medication Sig Dispense Refill  . calcium citrate-vitamin D (CITRACAL+D) 315-200 MG-UNIT per tablet Take 1 tablet by mouth 2 (two) times daily.        . eszopiclone (LUNESTA) 2 MG TABS Take 1 tablet (2 mg total) by mouth at bedtime as needed. Take immediately before bedtime  30 tablet  0  . gabapentin (NEURONTIN) 300 MG capsule Take 1 capsule (300 mg total) by mouth 2 (two) times daily.  4 capsule  0  . loratadine (CLARITIN) 10 MG tablet Take 10 mg by mouth daily.        . Multiple Vitamins-Minerals (CENTRUM PO) Take by mouth daily.        Marland Kitchen omeprazole (PRILOSEC OTC) 20 MG tablet Take 1 tablet (20 mg total) by mouth daily.  28 tablet  1  . SUMAtriptan (IMITREX) 50 MG tablet Take 50 mg by mouth every 2 (two) hours as needed.        . WELLBUTRIN XL 150 MG 24 hr tablet TAKE 2 TABLETS ONCE DAILY  180 tablet  0  . chlorpheniramine-HYDROcodone (  TUSSIONEX) 10-8 MG/5ML LQCR Take 5 mLs by mouth every 12 (twelve) hours as needed.  140 mL  0  . HYDROcodone-acetaminophen (VICODIN) 5-500 MG per tablet Take 1-2 tablets by mouth every 6 (six) hours as needed for pain.  15 tablet  0  . ketorolac (TORADOL) 30 MG/ML injection Inject 1 mL (30 mg total) into the muscle once.  1 mL  0    BP 128/90  Pulse 63  Temp 98.2 F (36.8 C) (Oral)  Resp 16  Wt 161 lb 1.9 oz (73.084 kg)  SpO2 97%  LMP 11/20/2011    Objective:   Physical Exam  Constitutional: She is oriented to person, place, and time. She appears well-developed and well-nourished. No distress.  HENT:  Head: Normocephalic and atraumatic.  Cardiovascular: Normal rate and regular rhythm.   No murmur heard. Pulmonary/Chest: Effort normal and breath sounds normal. No respiratory distress. She has no wheezes. She has no rales. She exhibits no tenderness.  Musculoskeletal: She exhibits no edema.  Neurological: She is alert and oriented to person, place, and time.       Bilateral UE strength 5/5, strong  equal handgrasps  Psychiatric: She has a normal mood and affect. Her behavior is normal. Judgment and thought content normal.          Assessment & Plan:

## 2011-12-18 NOTE — Patient Instructions (Addendum)
Please schedule a follow up appointment in 6 months.   

## 2011-12-18 NOTE — Assessment & Plan Note (Addendum)
Clinically improving.  Pt to complete prednisone.

## 2011-12-18 NOTE — Assessment & Plan Note (Signed)
Scheduled to see neurosurgeon next week.  In the meantime, she plans to continue PT and start a massage program.

## 2011-12-18 NOTE — Assessment & Plan Note (Signed)
She is following with a HA specialist at [facility].  She is using imitrex PRN and will soon be starting botox injections.

## 2012-01-27 ENCOUNTER — Other Ambulatory Visit: Payer: Self-pay | Admitting: Family

## 2012-04-01 ENCOUNTER — Ambulatory Visit (HOSPITAL_BASED_OUTPATIENT_CLINIC_OR_DEPARTMENT_OTHER)
Admission: RE | Admit: 2012-04-01 | Discharge: 2012-04-01 | Disposition: A | Discharge status: Home / Self Care | Payer: PRIVATE HEALTH INSURANCE | Source: Ambulatory Visit | Attending: Family | Admitting: Family

## 2012-04-01 ENCOUNTER — Ambulatory Visit (INDEPENDENT_AMBULATORY_CARE_PROVIDER_SITE_OTHER): Payer: PRIVATE HEALTH INSURANCE | Admitting: Family

## 2012-04-01 ENCOUNTER — Encounter (HOSPITAL_BASED_OUTPATIENT_CLINIC_OR_DEPARTMENT_OTHER): Payer: Self-pay | Admitting: *Deleted

## 2012-04-01 ENCOUNTER — Encounter: Payer: Self-pay | Admitting: Family

## 2012-04-01 ENCOUNTER — Telehealth: Payer: Self-pay | Admitting: Family

## 2012-04-01 ENCOUNTER — Emergency Department (HOSPITAL_BASED_OUTPATIENT_CLINIC_OR_DEPARTMENT_OTHER)
Admission: EM | Admit: 2012-04-01 | Discharge: 2012-04-01 | Disposition: A | Discharge status: Home / Self Care | Payer: PRIVATE HEALTH INSURANCE | Attending: Emergency Medicine | Admitting: Emergency Medicine

## 2012-04-01 VITALS — BP 118/68 | HR 77 | Temp 97.8°F | Resp 12 | Wt 162.1 lb

## 2012-04-01 DIAGNOSIS — R1031 Right lower quadrant pain: Secondary | ICD-10-CM | POA: Insufficient documentation

## 2012-04-01 DIAGNOSIS — J309 Allergic rhinitis, unspecified: Secondary | ICD-10-CM | POA: Insufficient documentation

## 2012-04-01 DIAGNOSIS — Z79899 Other long term (current) drug therapy: Secondary | ICD-10-CM | POA: Insufficient documentation

## 2012-04-01 DIAGNOSIS — Z9889 Other specified postprocedural states: Secondary | ICD-10-CM | POA: Insufficient documentation

## 2012-04-01 DIAGNOSIS — Z8739 Personal history of other diseases of the musculoskeletal system and connective tissue: Secondary | ICD-10-CM | POA: Insufficient documentation

## 2012-04-01 DIAGNOSIS — F329 Major depressive disorder, single episode, unspecified: Secondary | ICD-10-CM | POA: Insufficient documentation

## 2012-04-01 DIAGNOSIS — Z8669 Personal history of other diseases of the nervous system and sense organs: Secondary | ICD-10-CM | POA: Insufficient documentation

## 2012-04-01 DIAGNOSIS — R109 Unspecified abdominal pain: Secondary | ICD-10-CM

## 2012-04-01 DIAGNOSIS — F3289 Other specified depressive episodes: Secondary | ICD-10-CM | POA: Insufficient documentation

## 2012-04-01 DIAGNOSIS — Z8719 Personal history of other diseases of the digestive system: Secondary | ICD-10-CM | POA: Insufficient documentation

## 2012-04-01 DIAGNOSIS — K7689 Other specified diseases of liver: Secondary | ICD-10-CM | POA: Insufficient documentation

## 2012-04-01 LAB — POCT URINALYSIS DIPSTICK
Ketones, UA: NEGATIVE
Protein, UA: NEGATIVE
Spec Grav, UA: <=1.005
Urobilinogen, UA: 0.2
pH, UA: 6

## 2012-04-01 MED ORDER — IOHEXOL 300 MG/ML  SOLN
100.0000 mL | Freq: Once | INTRAMUSCULAR | Status: AC | PRN
  Administered 2012-04-01: 100 mL via INTRAVENOUS

## 2012-04-01 NOTE — ED Notes (Signed)
Abdominal pain x 2 days. Was seen by Sandford Craze earlier today for same and was called and told her CT scan showed a possible bowel obstruction.

## 2012-04-01 NOTE — Assessment & Plan Note (Addendum)
Will refer for CT abd/pelvis to evaluate for possible ovarian cyst versus diverticulitis.  She is aware that if pain worsens over the weekend she should go to the ED. UA notes trace blood only, likely related to recent menses.

## 2012-04-01 NOTE — Progress Notes (Signed)
Subjective:    Patient ID: Pamela Sanders, female    DOB: 1966/06/04, 46 y.o.   MRN: [id number]  HPI  Pamela Sanders is a 46 yr old female who presents today with chief complaint of RLQ pain.  She reports that pain started Wednesday evening 10/23. Initially she had diffuse abdominal pain. She reports that the discomfort kept her up all night long.  At that time she reports that she felt dizzy, had HA, neck pain, and didn't sleep well.  Thursday she stayed home from work.  Appetite was poor, HA all day.  Today she continues to have right sided abdominal pain. She denies associated fever, vomiting, dysuria or frequency.  Pain is worsened by walking.  Reports+ BM today following a colace last night. She is s/p appendectomy but had similar symptoms last year and underwent CT which revealed a large corpus luteal cyst. She reports that her period just ended.    Review of Systems See HPI  Past Medical History  Diagnosis Date  . Allergic rhinitis   . Depression (emotion)   . History of diverticulitis of colon   . GERD (gastroesophageal reflux disease)   . Osteoarthritis   . Migraine   . DJD (degenerative joint disease)     chronic neck pain    History   Social History  . Marital Status: Single    Spouse Name: N/A    Number of Children: N/A  . Years of Education: N/A   Occupational History  . Not on file.   Social History Main Topics  . Smoking status: Never Smoker   . Smokeless tobacco: Never Used  . Alcohol Use: Yes     2 drinks weekly  . Drug Use: No  . Sexually Active: Not on file   Other Topics Concern  . 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 Appendectomy   . Tmj arthroscopy     TMJ surgery  . Ovarian cyst surgery    abdominal surgery for ovarian cyst  . Cervical discectomy     Dr. Wyline Mood 06/2011[facility]    Family History  Problem Relation Age of Onset  . Arthritis    . Breast cancer    . Coronary artery disease Maternal Grandmother   . Hypertension      Allergies  Allergen Reactions  . Penicillins     Current Outpatient Prescriptions on File Prior to Visit  Medication Sig Dispense Refill  . calcium citrate-vitamin D (CITRACAL+D) 315-200 MG-UNIT per tablet Take 1 tablet by mouth 2 (two) times daily.        . chlorpheniramine-HYDROcodone (TUSSIONEX) 10-8 MG/5ML LQCR Take 5 mLs by mouth every 12 (twelve) hours as needed.  140 mL  0  . eszopiclone (LUNESTA) 2 MG TABS Take 1 tablet (2 mg total) by mouth at bedtime as needed. Take immediately before bedtime  30 tablet  0  . gabapentin (NEURONTIN) 300 MG capsule Take 1 capsule (300 mg total) by mouth 2 (two) times daily.  4 capsule  0  . ketorolac (TORADOL) 30 MG/ML injection Inject 1 mL (30 mg total) into the muscle once.  1 mL  0  . loratadine (CLARITIN) 10 MG tablet Take 10 mg by  mouth daily.        . Multiple Vitamins-Minerals (CENTRUM PO) Take by mouth daily.        . SUMAtriptan (IMITREX) 50 MG tablet Take 50 mg by mouth every 2 (two) hours as needed.        . WELLBUTRIN XL 150 MG 24 hr tablet TAKE 2 TABLETS BY MOUTH ONCE DAILY  180 tablet  0  . omeprazole (PRILOSEC OTC) 20 MG tablet Take 1 tablet (20 mg total) by mouth daily.  28 tablet  1    BP 118/68  Pulse 77  Temp 97.8 F (36.6 C) (Oral)  Resp 12  Wt 162 lb 1.9 oz (73.537 kg)  SpO2 100%  LMP 03/25/2012       Objective:   Physical Exam  Constitutional: She appears well-developed and well-nourished. No distress.  Cardiovascular: Normal rate and regular rhythm.   No murmur heard. Pulmonary/Chest: Effort normal and breath sounds normal. No respiratory distress. She has no wheezes. She has no rales. She exhibits no tenderness.  Abdominal: Soft. She exhibits no distension. There  is tenderness in the right lower quadrant. There is no rigidity and no guarding.  Psychiatric: She has a normal mood and affect. Her behavior is normal. Judgment and thought content normal.          Assessment & Plan:

## 2012-04-01 NOTE — ED Provider Notes (Signed)
Medical screening examination/treatment/procedure(s) were performed by non-physician practitioner and as supervising physician I was immediately available for consultation/collaboration.   Zyon Grout, MD 04/01/12 2339 

## 2012-04-01 NOTE — ED Provider Notes (Signed)
History     CSN: [id number]  Arrival date & time 04/01/12  1737   First MD Initiated Contact with Patient 04/01/12 1747      Chief Complaint  Patient presents with  . Abdominal Pain    (Consider location/radiation/quality/duration/timing/severity/associated sxs/prior treatment) HPI Comments: Pamela Sanders 46 y.o. female   The chief complaint is: Patient presents with:   Abdominal Pain    Patient presents to [facility]  After evaluation by PCP for abdominal pain. Ct evaluation showed subacute SBO of the ileum vs. Constipation.  Patient has had 3 days of worsening constipation and right lower quadrant abdominal pain.  She has also had associated nausea no vomiting.  Patient took Dulcolax and Metamucil without relief.  Today she has had several bouts of loose stool and diarrhea. Denies fevers, chills, myalgias, arthralgias. Denies DOE, SOB, chest tightness or pressure, radiation to left arm, jaw or back, or diaphoresis. Denies dysuria, flank pain, suprapubic pain, frequency, urgency, or hematuria. Denies headaches, light headedness, weakness, visual disturbances.      Patient is a 46 y.o. female presenting with abdominal pain. The history is provided by the patient and medical records. No language interpreter was used.  Abdominal Pain The primary symptoms of the illness include abdominal pain, nausea and diarrhea. The primary symptoms of the illness do not include fever, shortness of breath, vomiting or dysuria.  Additional symptoms associated with the illness include constipation. Symptoms associated with the illness do not include chills or hematuria.    Past Medical History  Diagnosis Date  . Allergic rhinitis   . Depression (emotion)   . History of diverticulitis of colon   . GERD (gastroesophageal reflux disease)   . Osteoarthritis   . Migraine   . DJD (degenerative joint disease)     chronic neck pain    Past Surgical History  Procedure Date  . Appendectomy   . Tmj  arthroscopy     TMJ surgery  . Ovarian cyst surgery     abdominal surgery for ovarian cyst  . Cervical discectomy     Dr. Wyline Mood 06/2011[facility]    Family History  Problem Relation Age of Onset  . Arthritis    . Breast cancer    . Coronary artery disease Maternal Grandmother   . Hypertension      History  Substance Use Topics  . Smoking status: Never Smoker   . Smokeless tobacco: Never Used  . Alcohol Use: Yes     2 drinks weekly    OB History    Grav Para Term Preterm Abortions TAB SAB Ect Mult Living                  Review of Systems  Constitutional: Negative for fever and chills.  HENT: Negative for trouble swallowing.   Respiratory: Negative for shortness of breath.   Cardiovascular: Negative for chest pain.  Gastrointestinal: Positive for nausea, abdominal pain, diarrhea and constipation. Negative for vomiting.  Genitourinary: Negative for dysuria and hematuria.  Musculoskeletal: Negative for myalgias and arthralgias.  Skin: Negative for rash.  Neurological: Negative for numbness.  All other systems reviewed and are negative.    Allergies  Penicillins  Home Medications   Current Outpatient Rx  Name Route Sig Dispense Refill  . CALCIUM CITRATE-VITAMIN D 315-200 MG-UNIT PO TABS Oral Take 1 tablet by mouth 2 (two) times daily.      Marland Kitchen HYDROCOD POLST-CPM POLST ER 10-8 MG/5ML PO LQCR Oral Take 5 mLs by mouth every 12 (  twelve) hours as needed. 140 mL 0  . ESZOPICLONE 2 MG PO TABS Oral Take 1 tablet (2 mg total) by mouth at bedtime as needed. Take immediately before bedtime 30 tablet 0  . GABAPENTIN 300 MG PO CAPS Oral Take 1 capsule (300 mg total) by mouth 2 (two) times daily. 4 capsule 0  . KETOROLAC TROMETHAMINE 30 MG/ML IJ SOLN Intramuscular Inject 1 mL (30 mg total) into the muscle once. 1 mL 0  . LORATADINE 10 MG PO TABS Oral Take 10 mg by mouth daily.      . CENTRUM PO Oral Take by mouth daily.      Marland Kitchen OMEPRAZOLE MAGNESIUM 20 MG PO TBEC Oral Take 1  tablet (20 mg total) by mouth daily. 28 tablet 1  . SUMATRIPTAN SUCCINATE 50 MG PO TABS Oral Take 50 mg by mouth every 2 (two) hours as needed.      . WELLBUTRIN XL 150 MG PO TB24  TAKE 2 TABLETS BY MOUTH ONCE DAILY 180 tablet 0    Dispense as written.    BP 111/76  Pulse 70  Temp 98 F (36.7 C) (Oral)  Resp 16  SpO2 100%  LMP 03/25/2012  Physical Exam  Constitutional: She is oriented to person, place, and time. She appears well-developed and well-nourished. No distress.  HENT:  Head: Normocephalic and atraumatic.  Eyes: Conjunctivae normal are normal. No scleral icterus.  Neck: Normal range of motion.  Cardiovascular: Normal rate, regular rhythm and normal heart sounds.  Exam reveals no gallop and no friction rub.   No murmur heard. Pulmonary/Chest: Effort normal and breath sounds normal. No respiratory distress.  Abdominal: Soft. Bowel sounds are normal. She exhibits no distension and no mass. There is tenderness (RLQ). There is no guarding.  Neurological: She is alert and oriented to person, place, and time.  Skin: Skin is warm and dry. She is not diaphoretic.    ED Course  Procedures (including critical care time)  Labs Reviewed - No data to display Ct Abdomen Pelvis W Contrast  04/01/2012  *RADIOLOGY REPORT*  Clinical Data: 46 year old female 32 our history of right lower quadrant pain.  Status post appendectomy.  Evaluate for ovarian cyst or diverticulitis.  CT ABDOMEN AND PELVIS WITH CONTRAST  Technique:  Multidetector CT imaging of the abdomen and pelvis was performed following the standard protocol during bolus administration of intravenous contrast.  Contrast: OMNIPAQUE IOHEXOL 300 MG/ML  SOLN  Comparison: CT abdomen pelvis 03/06/2011  Findings: The lung bases are clear.  The heart size is normal.  There is mild diffuse fatty infiltration the liver.  No focal hepatic lesion or biliary ductal dilatation.  The spleen, adrenal glands, pancreas, and kidneys are within  normal limits.  Common bile duct is normal in caliber.  Stomach is well distended with oral contrast and appears normal. Jejunal loops of small bowel in the left upper quadrant are normal in caliber.  In the right mid abdomen is a 2.5 cm caliber small bowel loop, opacified with oral contrast.  There are several upper normal caliber small bowel loops in the pelvis.  An ileal loop in the inferior pelvis measures up to used to 2.6 cm diameter and it is filled with stool (fecalization).  Immediately distal to this fecalized small bowel loop is an abrupt transition to a completely, decompressed small bowel loop just to the right of midline (image #[id number]). The remainder of the distal and the terminal ileum are completely decompressed.  There are postsurgical changes  of appendectomy.  There is a moderate amount of stool in the colon.  The colon is normal in caliber.  Rectum appears normal.  The uterus, adnexa, urinary bladder are within normal limits.  Abdominal aorta is normal in caliber, and the branch vessels are patent.   There is an upper normal 9 mm gastrohepatic ligament lymph node, upper normal.  No pathologically enlarged lymph nodes are identified in the mesentery or retroperitoneum of the abdomen or pelvis.  There is no ascites.  No free air.  Tiny umbilical hernia contains fat only.  The bones appear within normal limits.  No acute or suspicious bony abnormality.  IMPRESSION:  1.  Mild dilatation of some small bowel loops in the abdomen pelvis, with fecalization of one of the most distended small bowel loops in the lower pelvis, and a distinct transition point to completely decompressed distal and terminal ileum as described above.  These findings raise the possibility of a low grade, and possibly subacute or chronic small bowel obstruction, given the presence of fecal material within the ileal loops seen just proximal to the decompressed distal ileum. If the patient has constipation, this could potentially  contribute to these findings, as fecalization can be seen in the setting of constipation.  A developing small bowel obstruction from adhesions cannot be excluded. Question if the patient is having any obstructive symptoms. 2.  Moderate amount of stool within the colon.  Question if the patient could have clinical history of constipation. 3.  Mild fatty infiltration of the liver. 4.  Appendectomy.   Original Report Authenticated By: Britta Mccreedy, M.D.      1. Abdominal pain       MDM  Patient with continued abdominal pain, she is moving her bowels and has an appetite. No Vomiting.  Patient will go home and f/u with [facility].  Strong return precautions discussed . Discussed reasons to seek immediate care. Patient expresses understanding and agrees with plan.         Arthor Captain, PA-C 04/01/12 2006

## 2012-04-01 NOTE — Patient Instructions (Addendum)
Please complete CT on the first floor.

## 2012-04-01 NOTE — Telephone Encounter (Signed)
Reviewed CT scan results, ? Developing SBO.  Case discussed with Dr. Rodena Medin.  Spoke with pt.  [facility].  She is agreeable. Report give to charge nurse vickie in [facility] ED.

## 2012-04-04 ENCOUNTER — Telehealth: Payer: Self-pay | Admitting: Family

## 2012-04-04 NOTE — Telephone Encounter (Signed)
Left message for pt to see how she is feeling.  I would like to see her back in the office early this week.

## 2012-04-04 NOTE — Telephone Encounter (Signed)
Pt returned call and left message that she is feeling better. Went to ER this weekend and was advised to take fiber and do a "miralax blowout". Pt reports that she has not done this yet due to schedule but will do it next weekend. Attempted to reach pt and left message on cell # to call and arrange f/u early this week.

## 2012-04-06 ENCOUNTER — Ambulatory Visit (INDEPENDENT_AMBULATORY_CARE_PROVIDER_SITE_OTHER): Payer: PRIVATE HEALTH INSURANCE | Admitting: Family

## 2012-04-06 ENCOUNTER — Ambulatory Visit (HOSPITAL_BASED_OUTPATIENT_CLINIC_OR_DEPARTMENT_OTHER)
Admission: RE | Admit: 2012-04-06 | Discharge: 2012-04-06 | Disposition: A | Discharge status: Home / Self Care | Payer: PRIVATE HEALTH INSURANCE | Source: Ambulatory Visit | Attending: Family | Admitting: Family

## 2012-04-06 ENCOUNTER — Encounter: Payer: Self-pay | Admitting: Family

## 2012-04-06 VITALS — BP 110/74 | HR 60 | Temp 97.8°F | Resp 18 | Wt 161.0 lb

## 2012-04-06 DIAGNOSIS — R1031 Right lower quadrant pain: Secondary | ICD-10-CM

## 2012-04-06 DIAGNOSIS — Z09 Encounter for follow-up examination after completed treatment for conditions other than malignant neoplasm: Secondary | ICD-10-CM | POA: Insufficient documentation

## 2012-04-06 DIAGNOSIS — K5900 Constipation, unspecified: Secondary | ICD-10-CM

## 2012-04-06 NOTE — Assessment & Plan Note (Signed)
Resolved.  Will obtain KUB to re-evaluate stool burden and formally exclude SBO.

## 2012-04-06 NOTE — Patient Instructions (Addendum)
Please complete x-ray on the first floor.  

## 2012-04-06 NOTE — Progress Notes (Signed)
Subjective:    Patient ID: Pamela Sanders, female    DOB: [birth year], 46 y.o.   MRN: [id number]  HPI Ms. Vedder is a 46 yr old female who presents today for ED follow up. (Records are reviewed. She was seen in the ED on Friday 10/25 following CT findings from office visit questioning an early small bowel obstruction.  She reports that she had significant diarrhea that evening in the ER.  She reports that she has added metamucil. She has not yet tried the miralax which was recommended by the ED.  Denies current abdominal pain or nausea. Trying to eat more veggies.    Review of Systems See HPI  Past Medical History  Diagnosis Date  . Allergic rhinitis   . Depression (emotion)   . History of diverticulitis of colon   . GERD (gastroesophageal reflux disease)   . Osteoarthritis   . Migraine   . DJD (degenerative joint disease)     chronic neck pain    History   Social History  . Marital Status: Single    Spouse Name: N/A    Number of Children: N/A  . Years of Education: N/A   Occupational History  . Not on file.   Social History Main Topics  . Smoking status: Never Smoker   . Smokeless tobacco: Never Used  . Alcohol Use: Yes     2 drinks weekly  . Drug Use: No  . Sexually Active: Not on file   Other Topics Concern  . 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 Appendectomy   . Tmj arthroscopy     TMJ surgery  . Ovarian cyst surgery     abdominal surgery for ovarian cyst  . Cervical discectomy     Dr. Wyline Mood 06/2011[facility]    Family History  Problem Relation Age of Onset  . Arthritis    . Breast cancer    . Coronary artery disease Maternal Grandmother   . Hypertension      Allergies  Allergen Reactions  .  Penicillins     Current Outpatient Prescriptions on File Prior to Visit  Medication Sig Dispense Refill  . calcium citrate-vitamin D (CITRACAL+D) 315-200 MG-UNIT per tablet Take 1 tablet by mouth 2 (two) times daily.        . eszopiclone (LUNESTA) 2 MG TABS Take 1 tablet (2 mg total) by mouth at bedtime as needed. Take immediately before bedtime  30 tablet  0  . gabapentin (NEURONTIN) 300 MG capsule Take 1 capsule (300 mg total) by mouth 2 (two) times daily.  4 capsule  0  . loratadine (CLARITIN) 10 MG tablet Take 10 mg by mouth daily.        . Multiple Vitamins-Minerals (CENTRUM PO) Take by mouth daily.        . SUMAtriptan (IMITREX) 50 MG tablet Take 50 mg by mouth every 2 (two) hours as needed.        . WELLBUTRIN XL 150 MG 24 hr tablet TAKE 2 TABLETS BY MOUTH ONCE DAILY  180 tablet  0  . chlorpheniramine-HYDROcodone (TUSSIONEX) 10-8 MG/5ML LQCR Take 5 mLs by mouth every 12 (twelve) hours as needed.  140 mL  0  .  ketorolac (TORADOL) 30 MG/ML injection Inject 1 mL (30 mg total) into the muscle once.  1 mL  0  . omeprazole (PRILOSEC OTC) 20 MG tablet Take 1 tablet (20 mg total) by mouth daily.  28 tablet  1    BP 110/74  Pulse 60  Temp 97.8 F (36.6 C) (Oral)  Resp 18  Wt 161 lb 0.6 oz (73.047 kg)  SpO2 99%  LMP 03/23/2012       Objective:   Physical Exam  Constitutional: She appears well-developed and well-nourished. No distress.  HENT:  Head: Normocephalic and atraumatic.  Cardiovascular: Normal rate and regular rhythm.   No murmur heard. Pulmonary/Chest: Effort normal and breath sounds normal.  Abdominal: Soft. Bowel sounds are normal. She exhibits no distension and no mass. There is no tenderness. There is no rebound and no guarding.  Musculoskeletal: She exhibits no edema.  Neurological: She is alert.  Skin: Skin is warm and dry.  Psychiatric: She has a normal mood and affect. Her behavior is normal. Judgment and thought content normal.          Assessment &  Plan:

## 2012-04-29 IMAGING — US US ABDOMEN COMPLETE
1 series · 14 of 25 positions shown · non-contrast
Comparison: None.

CLINICAL DATA: 44-year-old female with nausea.  Query
cholecystitis.

COMPLETE ABDOMINAL ULTRASOUND

[Series 1: us abdomen complete · 0.32mm/px · 14 of 59 slices shown]
[im 1/59]
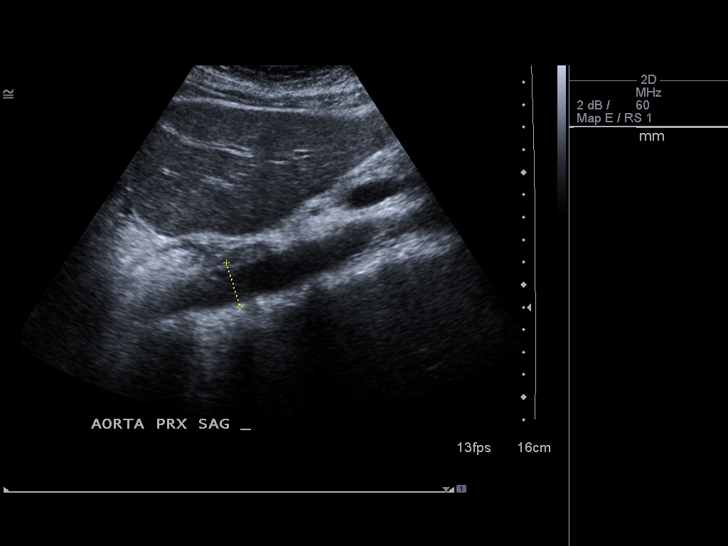
[im 5/59]
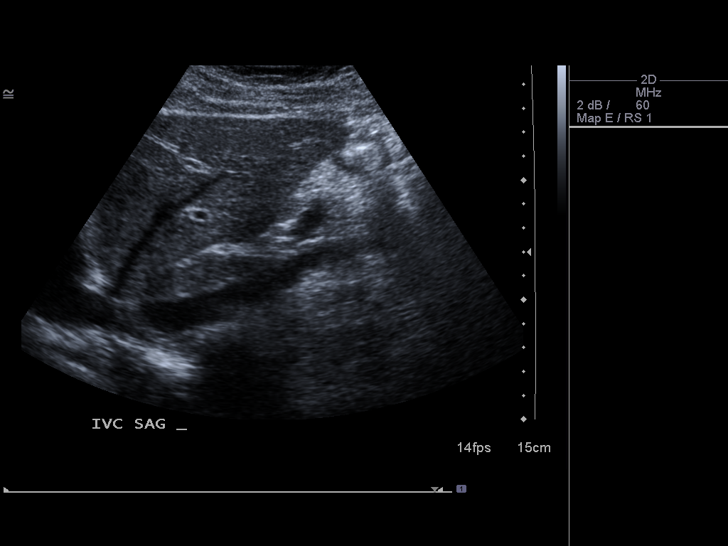
[im 10/59]
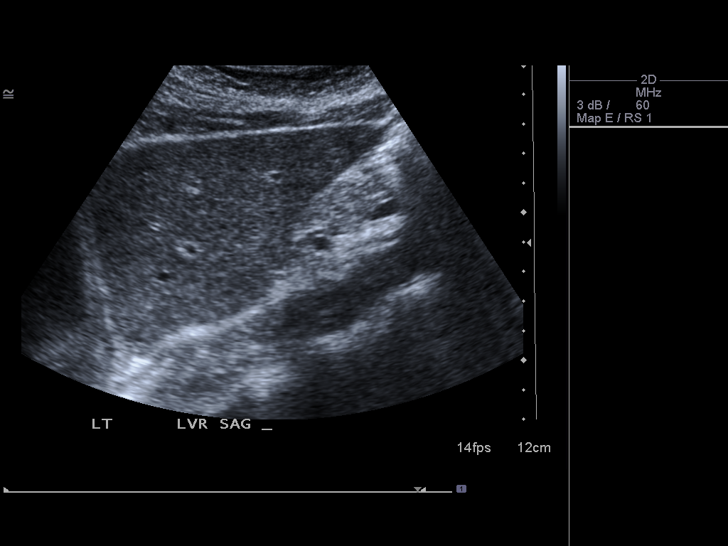
[im 15/59]
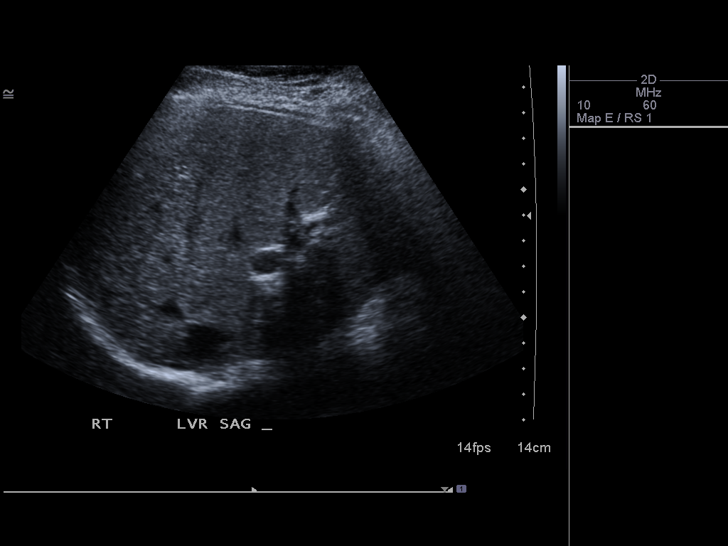
[im 20/59]
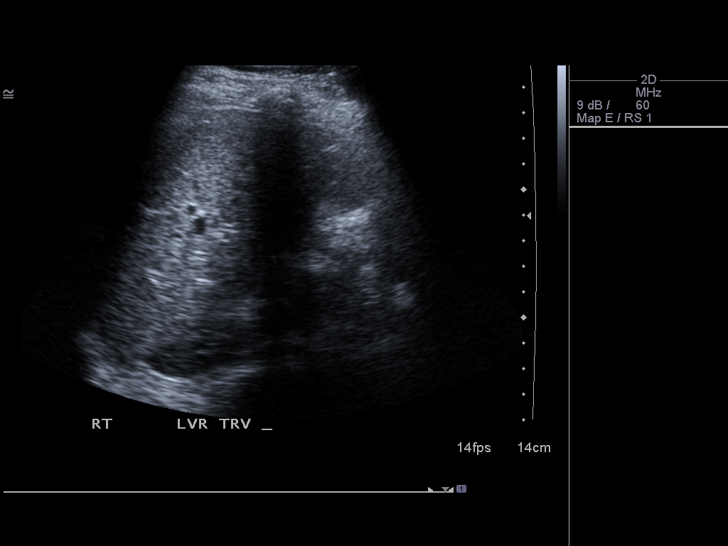
[im 22/59]
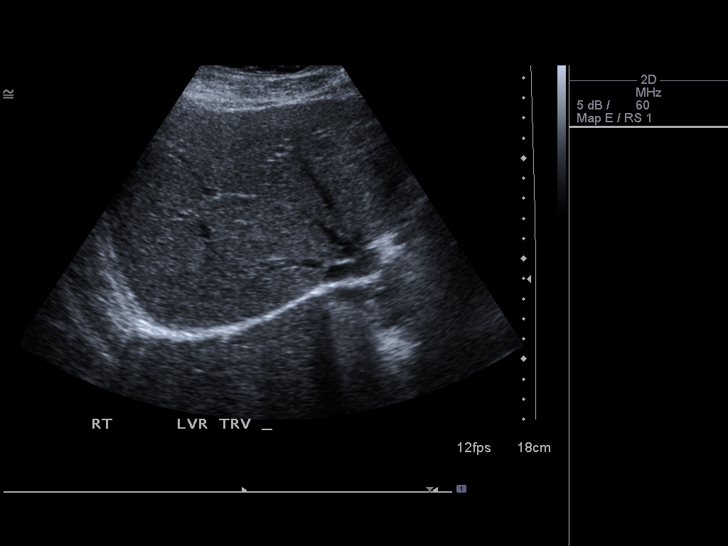
[im 27/59]
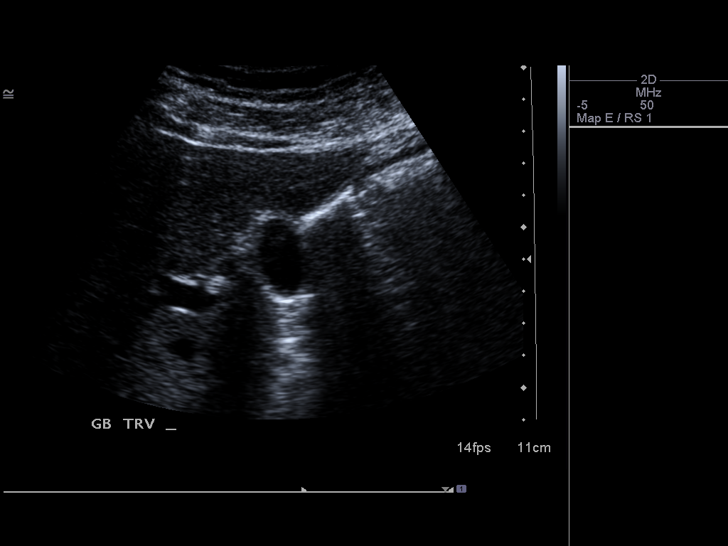
[im 32/59]
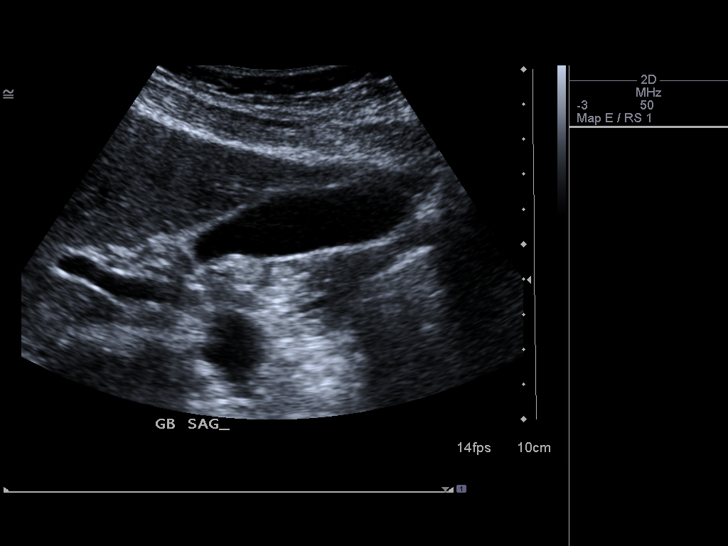
[im 37/59]
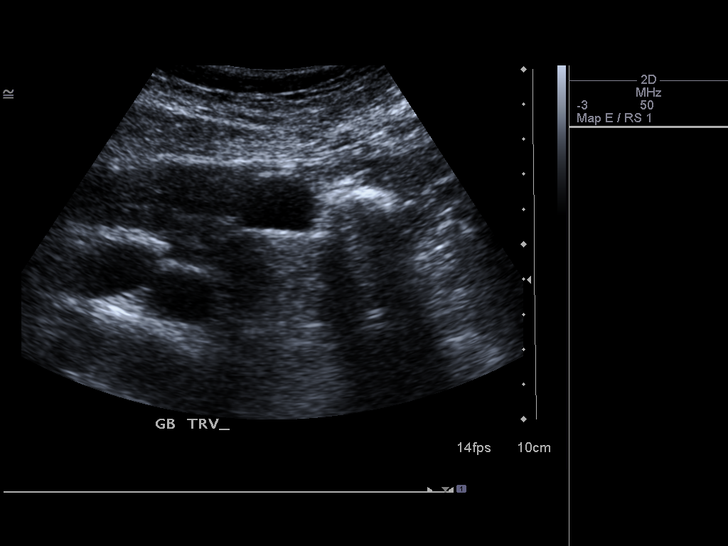
[im 39/59]
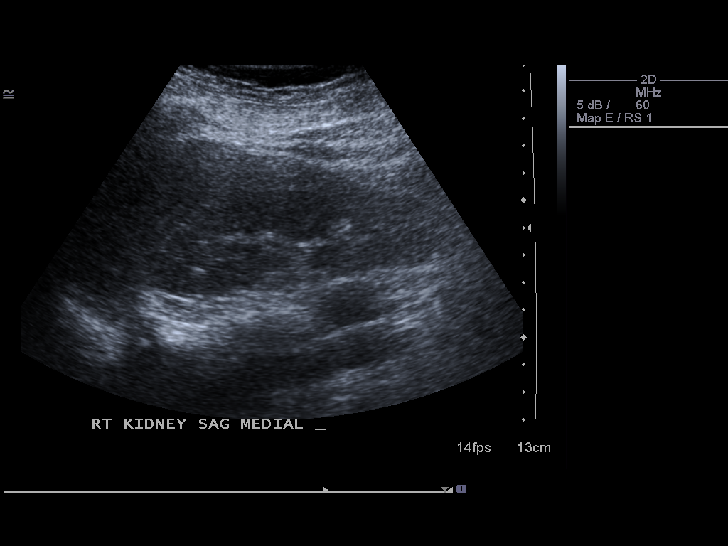
[im 44/59]
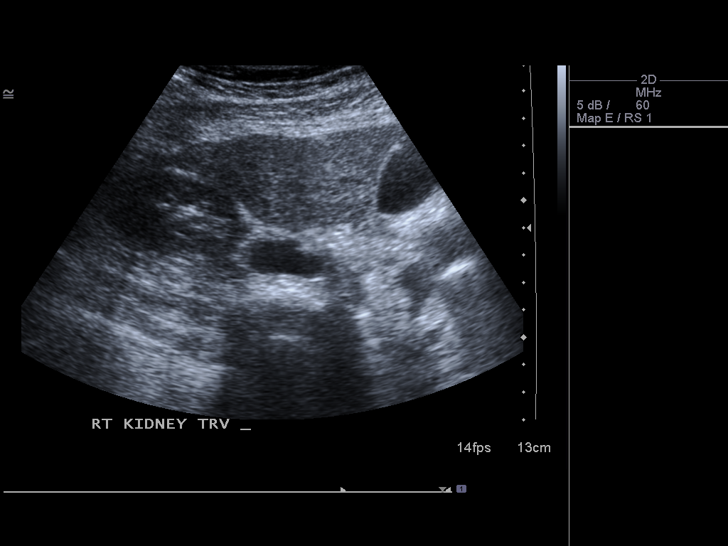
[im 49/59]
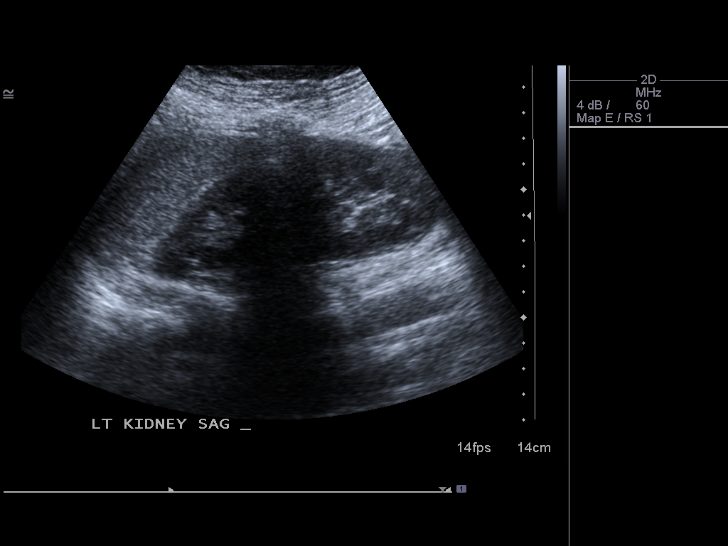
[im 54/59]
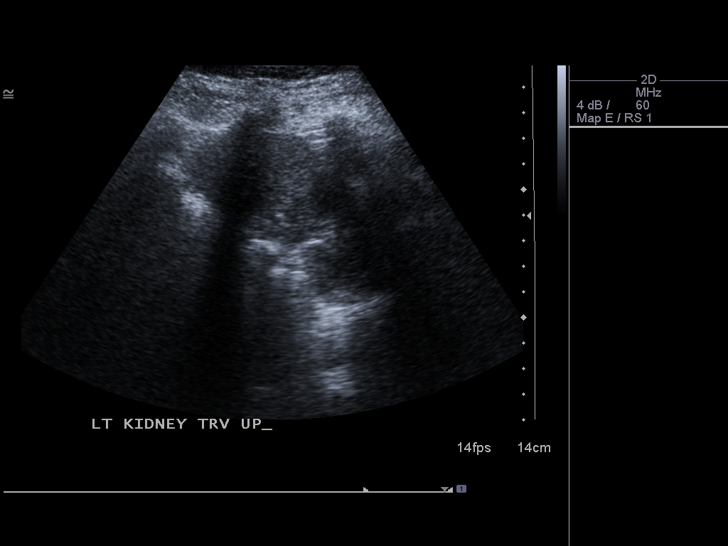
[im 59/59]
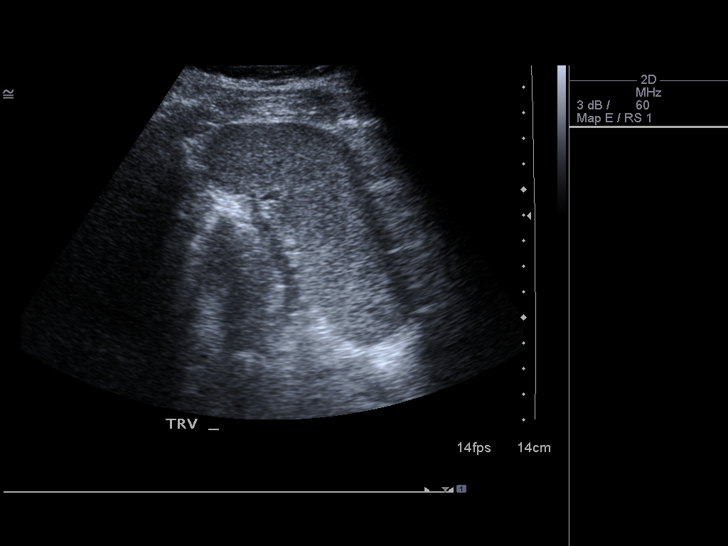

[14 of 25 positions shown; findings below may reference images not displayed]

FINDINGS: Gallbladder:  No gallstones, gallbladder wall thickening, or
pericholecystic fluid. No sonographic Murphy's sign elicited.

Common bile duct:  Normal, up to 5 mm in diameter.

Liver:  No focal lesion identified.  Within normal limits in
parenchymal echogenicity.

IVC:  Incompletely visualized due to overlying bowel gas,
visualized portions within normal limits.

Pancreas:  Incompletely visualized due to overlying bowel gas,
visualized portions within normal limits.

Spleen:  Normal measuring 10.8 cm in length.

Right Kidney:  Normal measuring 10.4 cm in length.

Left Kidney:  Normal measuring 11.3 cm in length.

Abdominal aorta:  No aneurysm identified.
IMPRESSION: Normal gallbladder.  Negative abdominal ultrasound.

## 2012-06-07 ENCOUNTER — Encounter: Payer: Self-pay | Admitting: Family Medicine

## 2012-06-07 ENCOUNTER — Ambulatory Visit (INDEPENDENT_AMBULATORY_CARE_PROVIDER_SITE_OTHER): Payer: PRIVATE HEALTH INSURANCE | Admitting: Family Medicine

## 2012-06-07 VITALS — BP 112/81 | HR 74 | Temp 99.1°F | Ht 67.0 in | Wt 160.0 lb

## 2012-06-07 DIAGNOSIS — J209 Acute bronchitis, unspecified: Secondary | ICD-10-CM

## 2012-06-07 MED ORDER — ALBUTEROL SULFATE HFA 108 (90 BASE) MCG/ACT IN AERS: 2.0000 | INHALATION_SPRAY | RESPIRATORY_TRACT | Status: DC | PRN

## 2012-06-07 MED ORDER — PREDNISONE 20 MG PO TABS: ORAL_TABLET | ORAL | Status: DC

## 2012-06-07 MED ORDER — ALBUTEROL SULFATE (2.5 MG/3ML) 0.083% IN NEBU
2.5000 mg | INHALATION_SOLUTION | Freq: Once | RESPIRATORY_TRACT | Status: AC
  Administered 2012-06-07: 2.5 mg via RESPIRATORY_TRACT

## 2012-06-07 NOTE — Progress Notes (Signed)
OFFICE NOTE  06/07/2012  CC:  Chief Complaint  Patient presents with  . Cough    congestion, fatigue since Friday, not getting better     HPI: Patient is a 46 y.o. Caucasian female who is here for respiratory complaints. Pt presents complaining of respiratory symptoms for 5 days.  Primary symptoms are: nasal congestion, cough, chest tightness, mild diffuse face pain, upper teeth pain, mild HA.  Worst symptoms seems to be the cough and fatigue.  Some achiness in back.  Lately the symptoms seem to be worsening.  +subjective fever but temp normal. No ST. Symptoms made worse by laying on back, walking around.  Symptoms improved by sitting upright and taking mucinex plain.  Takes tylenol cold and mucinex hs. Smoker? no Recent sick contact? None specifically known Muscle or joint aches? mild Flu shot this season at least 2 wks ago? yes  Additional ROS: mild nausea and diarrhea.  No rash.  No neck stiffness.   Appetite loss.   Pertinent PMH:  Past Medical History  Diagnosis Date  . Allergic rhinitis   . Depression (emotion)   . History of diverticulitis of colon   . GERD (gastroesophageal reflux disease)   . Osteoarthritis   . Migraine   . DJD (degenerative joint disease)     chronic neck pain    MEDS:  Outpatient Prescriptions Prior to Visit  Medication Sig Dispense Refill  . gabapentin (NEURONTIN) 300 MG capsule Take 1 capsule (300 mg total) by mouth 2 (two) times daily.  4 capsule  0  . loratadine (CLARITIN) 10 MG tablet Take 10 mg by mouth daily.        . Multiple Vitamins-Minerals (CENTRUM PO) Take by mouth daily.        . SUMAtriptan (IMITREX) 50 MG tablet Take 50 mg by mouth every 2 (two) hours as needed.        . WELLBUTRIN XL 150 MG 24 hr tablet TAKE 2 TABLETS BY MOUTH ONCE DAILY  180 tablet  0  . calcium citrate-vitamin D (CITRACAL+D) 315-200 MG-UNIT per tablet Take 1 tablet by mouth 2 (two) times daily.        . eszopiclone (LUNESTA) 2 MG TABS Take 1 tablet (2 mg  total) by mouth at bedtime as needed. Take immediately before bedtime  30 tablet  0  . omeprazole (PRILOSEC OTC) 20 MG tablet Take 1 tablet (20 mg total) by mouth daily.  28 tablet  1   Last reviewed on 06/07/2012 11:11 AM by Jeoffrey Massed, MD  PE: Blood pressure 112/81, pulse 74, temperature 99.1 F (37.3 C), temperature source Temporal, height 5\' 7"  (1.702 m), weight 160 lb (72.576 kg), SpO2 99.00%. VS: noted--normal. Gen: alert, NAD, NONTOXIC APPEARING. HEENT: eyes without injection, drainage, or swelling.  Ears: EACs clear, TMs with normal light reflex and landmarks.  Nose: Clear rhinorrhea, with some dried, crusty exudate adherent to mildly injected mucosa.  No purulent d/c.  No paranasal sinus TTP.  No facial swelling.  Throat and mouth without focal lesion.  No pharyngial swelling, erythema, or exudate.   Neck: supple, no LAD.   LUNGS: Diffuse coarse expiratory wheezing, mildly prolonged exp phase and excessive post-exhalation coughing. Nonlabored resps.   CV: RRR, no m/r/g. EXT: no c/c/e SKIN: no rash  IMPRESSION AND PLAN:  Acute bronchitis With significant RAD component.  Significant response to to albuterol neb here today. Prednisone 40mg  qd x 5d. Ventolin HFA 2 pufffs q4h prn.   An After Visit Summary was  printed and given to the patient.  FOLLOW UP: prn

## 2012-06-09 DIAGNOSIS — J209 Acute bronchitis, unspecified: Secondary | ICD-10-CM | POA: Insufficient documentation

## 2012-06-09 NOTE — Assessment & Plan Note (Signed)
With significant RAD component.  Significant response to to albuterol neb here today. Prednisone 40mg  qd x 5d. Ventolin HFA 2 pufffs q4h prn.

## 2012-07-15 ENCOUNTER — Telehealth: Payer: Self-pay | Admitting: Family

## 2012-07-15 MED ORDER — ESZOPICLONE 2 MG PO TABS: 2.0000 mg | ORAL_TABLET | Freq: Every evening | ORAL | Status: DC | PRN

## 2012-07-15 NOTE — Telephone Encounter (Signed)
Rx called to [facility], #30 x no refills.

## 2012-07-15 NOTE — Telephone Encounter (Signed)
lunesta 2mg  qty 30 take 1 tablet by mouth immediately before bedtime last fill 12-18-11

## 2012-08-03 ENCOUNTER — Other Ambulatory Visit: Payer: Self-pay | Admitting: Family

## 2012-10-12 ENCOUNTER — Encounter: Payer: Self-pay | Admitting: Family

## 2012-12-06 ENCOUNTER — Encounter: Payer: Self-pay | Admitting: Family

## 2012-12-06 ENCOUNTER — Ambulatory Visit (INDEPENDENT_AMBULATORY_CARE_PROVIDER_SITE_OTHER): Payer: PRIVATE HEALTH INSURANCE | Admitting: Family

## 2012-12-06 VITALS — BP 108/70 | HR 70 | Temp 98.0°F | Resp 14 | Ht 67.0 in | Wt 156.1 lb

## 2012-12-06 DIAGNOSIS — J019 Acute sinusitis, unspecified: Secondary | ICD-10-CM

## 2012-12-06 DIAGNOSIS — R110 Nausea: Secondary | ICD-10-CM

## 2012-12-06 MED ORDER — ONDANSETRON HCL 4 MG PO TABS: 4.0000 mg | ORAL_TABLET | Freq: Three times a day (TID) | ORAL | Status: DC | PRN

## 2012-12-06 MED ORDER — PROMETHAZINE HCL 25 MG/ML IJ SOLN
25.0000 mg | Freq: Once | INTRAMUSCULAR | Status: AC
  Administered 2012-12-06: 25 mg via INTRAMUSCULAR

## 2012-12-06 MED ORDER — AZITHROMYCIN 250 MG PO TABS: ORAL_TABLET | ORAL | Status: DC

## 2012-12-06 NOTE — Progress Notes (Signed)
Subjective:    Patient ID: Pamela Sanders, female    DOB: Feb 19, 1966, 47 y.o.   MRN: [id number]  HPI Pamela Sanders is a 48 year old who presents today with congestion. Right ear completely clogged since Sunday morning. Complains of tinnitus, fatigue, frontal and maxillary sinus pressure, mild jaw pain and, mild post nasal drip, and increasing dizziness. Dizziness described as swimming with some spinning that is worse with movement. Dizziness has been extreme since this morning with accompanying nausea. Claritin, saline rinses, phenylepherine have not helped. Left ear not affected.    Review of Systems  Constitutional: Positive for chills, appetite change and fatigue. Negative for fever and diaphoresis.  HENT: Positive for hearing loss (right ear), neck pain, postnasal drip, sinus pressure and tinnitus. Negative for ear pain, congestion, sore throat, rhinorrhea, sneezing and ear discharge.   Eyes: Negative.   Respiratory: Positive for cough. Negative for choking, chest tightness, shortness of breath, wheezing and stridor.   Cardiovascular: Positive for palpitations. Negative for chest pain.  Gastrointestinal: Positive for nausea and diarrhea. Negative for vomiting, abdominal pain, constipation and blood in stool.  Endocrine: Negative for polyuria.  Genitourinary: Negative for dysuria and difficulty urinating.  Musculoskeletal: Negative for arthralgias.  Skin: Negative for rash.  Neurological: Positive for dizziness and headaches. Negative for syncope and light-headedness.  Psychiatric/Behavioral: Negative.    Past Medical History  Diagnosis Date  . Allergic rhinitis   . Depression (emotion)   . History of diverticulitis of colon   . GERD (gastroesophageal reflux disease)   . Osteoarthritis   . Migraine   . DJD (degenerative joint disease)     chronic neck pain    History   Social History  . Marital Status: Single    Spouse Name: N/A    Number of Children: N/A  . Years of Education:  N/A   Occupational History  . Not on file.   Social History Main Topics  . Smoking status: Never Smoker   . Smokeless tobacco: Never Used  . Alcohol Use: Yes     Comment: 2 drinks weekly  . Drug Use: No  . Sexually Active: Not on file   Other Topics Concern  . Not on file   Social History Narrative   Domestic Partner   no children   She grew up in [place], [place]    Family moved to [place] when she was in [employer]   Occupation:lab research / Personal assistant     Regular Exercise:  3 x weekly   Caffeine Use:  2 cups coffee daily, 1 tea    Past Surgical History  Procedure Laterality Date  . Appendectomy    . Tmj arthroscopy      TMJ surgery  . Ovarian cyst surgery      abdominal surgery for ovarian cyst  . Cervical discectomy      Dr. Wyline Mood 06/2011[facility]    Family History  Problem Relation Age of Onset  . Arthritis    . Breast cancer    . Coronary artery disease Maternal Grandmother   . Hypertension      Allergies  Allergen Reactions  . Penicillins     Current Outpatient Prescriptions on File Prior to Visit  Medication Sig Dispense Refill  . calcium citrate-vitamin D (CITRACAL+D) 315-200 MG-UNIT per tablet Take 1 tablet by mouth 2 (two) times daily.        . eszopiclone (LUNESTA) 2 MG TABS Take 1 tablet (2 mg total) by mouth at bedtime as  needed. Take immediately before bedtime  30 tablet  0  . gabapentin (NEURONTIN) 300 MG capsule Take 1 capsule (300 mg total) by mouth 2 (two) times daily.  4 capsule  0  . loratadine (CLARITIN) 10 MG tablet Take 10 mg by mouth daily.        . Multiple Vitamins-Minerals (CENTRUM PO) Take by mouth daily.        . SUMAtriptan (IMITREX) 50 MG tablet Take 50 mg by mouth every 2 (two) hours as needed.        . WELLBUTRIN XL 150 MG 24 hr tablet TAKE 2 TABLETS BY MOUTH ONCE DAILY  180 tablet  1  . omeprazole (PRILOSEC OTC) 20 MG tablet Take 1 tablet (20 mg total) by mouth daily.  28 tablet  1   No current facility-administered medications  on file prior to visit.    BP 108/70  Pulse 70  Temp(Src) 98 F (36.7 C) (Oral)  Resp 14  Ht 5\' 7"  (1.702 m)  Wt 156 lb 1.9 oz (70.816 kg)  BMI 24.45 kg/m2  SpO2 99%       Objective:   Physical Exam  Constitutional: She is oriented to person, place, and time. She appears well-developed and well-nourished. No distress.  HENT:  Head: Normocephalic and atraumatic.  Right Ear: External ear normal. A middle ear effusion is present. Decreased hearing is noted.  Left Ear: Tympanic membrane, external ear and ear canal normal.  Mouth/Throat: No oropharyngeal exudate.  Neck: Normal range of motion. Neck supple.  Cardiovascular: Normal rate, regular rhythm, normal heart sounds and intact distal pulses.  Exam reveals no gallop and no friction rub.   No murmur heard. Pulmonary/Chest: Effort normal and breath sounds normal. No respiratory distress. She has no wheezes. She has no rales. She exhibits no tenderness.  Abdominal: Soft.  Lymphadenopathy:    She has no cervical adenopathy.  Neurological: She is alert and oriented to person, place, and time.  Skin: Skin is warm and dry. She is not diaphoretic.  Psychiatric: She has a normal mood and affect. Her behavior is normal. Judgment and thought content normal.          Assessment & Plan:

## 2012-12-06 NOTE — Patient Instructions (Addendum)
Call if symptoms worsen or if not improved in 2-3 days.  

## 2012-12-06 NOTE — Assessment & Plan Note (Signed)
Right sided maxiallary sinusitis. Suspect right sided ear effusion which may be contributing to dizziness. Will treat with Zithromax, Zofran, continue Claratin, attempt Sudafed as tollerated. Phenegran injection today.

## 2012-12-08 ENCOUNTER — Ambulatory Visit (INDEPENDENT_AMBULATORY_CARE_PROVIDER_SITE_OTHER): Payer: PRIVATE HEALTH INSURANCE | Admitting: Nurse Practitioner

## 2012-12-08 ENCOUNTER — Telehealth: Payer: Self-pay | Admitting: Family

## 2012-12-08 ENCOUNTER — Encounter: Payer: Self-pay | Admitting: Nurse Practitioner

## 2012-12-08 VITALS — BP 120/70 | HR 65 | Temp 97.7°F | Ht 67.0 in | Wt 160.0 lb

## 2012-12-08 DIAGNOSIS — H6981 Other specified disorders of Eustachian tube, right ear: Secondary | ICD-10-CM

## 2012-12-08 DIAGNOSIS — H6980 Other specified disorders of Eustachian tube, unspecified ear: Secondary | ICD-10-CM

## 2012-12-08 MED ORDER — FLUTICASONE PROPIONATE 50 MCG/ACT NA SUSP: NASAL | Status: DC

## 2012-12-08 NOTE — Telephone Encounter (Signed)
Patient Information:  Caller Name: Manar  Phone: [phone]  Patient: Pamela, Sanders  Gender: Female  DOB: Nov 02, 1965  Age: 47 Years  PCP: Sandford Craze (Adults only)  Pregnant: No  Office Follow Up:  Does the office need to follow up with this patient?: Yes  Instructions For The Office: Please call back regarding work in appointment.  RN Note: Bilateral ear congestion; some crackling in left ear; right ear remains very congested with pressure, no pain. Continue to feel dizzy.  On antibiotic and decongestant per office visit 12/06/12.  Advised to see MD today due to holiday weekend.  No appointments remain for 12/08/12.  Needs at least 30 minutes to get from work in [place] to the office.  Please call back regarding work in appointment.    Symptoms  Reason For Call & Symptoms: Ongoing right ear congestion with dizziness.  Reviewed Health History In EMR: Yes  Reviewed Medications In EMR: Yes  Reviewed Allergies In EMR: No  Reviewed Surgeries / Procedures: Yes  Date of Onset of Symptoms: 12/04/2012  Treatments Tried: Zpack, Phenergan IM, Neti Pot sinus rinse  Treatments Tried Worked: No OB / GYN:  LMP: 12/05/2012  Guideline(s) Used:  Ear - Congestion  Disposition Per Guideline:   See Within 3 Days in Office  Reason For Disposition Reached:   Ear congestion present > 48 hours  Advice Given:  Causes  Upper respiratory infections (colds) and nasal allergies (hay fever) can block the eustachian tube.  Blowing the nose too hard can also push air and fluid into the eustachian tube.  Treatment - Chewing and Swallowing:   Try chewing gum.  You can also try swallowing water while pinching your nostrils closed. The reason this works is that it creates a small vacuum in the nose. This helps the eustachian tube to open up.  Call Back If:   Ear pain or fever occurs  You become worse.  RN Overrode Recommendation:  Make Appointment  Recommend appointment today due to holiday  weekend.

## 2012-12-08 NOTE — Telephone Encounter (Signed)
Pt was given [facility]'s number to schedule an appt. I called and informed Diane at OR also

## 2012-12-08 NOTE — Patient Instructions (Addendum)
Take pseudoephedrine 30 mg twice daily for next 5-6 days if tolerated. Also take 400 mg ibuprophen twice daily with food for next 3-4 days. Use flonase as prescribed for at least 10 -14 days. If no improvement, please see ENT.

## 2012-12-08 NOTE — Progress Notes (Deleted)
  Subjective:    Patient ID: Pamela Sanders, female    DOB: 1965/09/01, 47 y.o.   MRN: [id number]  HPI    Review of Systems     Objective:   Physical Exam        Assessment & Plan:

## 2012-12-08 NOTE — Progress Notes (Signed)
  Subjective:    Patient ID: Pamela Sanders, female    DOB: [birth year], 47 y.o.   MRN: [id number]  Sinusitis This is a recurrent problem. The current episode started in the past 7 days (ear fullness started 4 days ago, dizzyness started 3 dys ago, pt treated w/antibiotic & phenergan). The problem is unchanged. There has been no fever. She is experiencing no pain (c/o hearing loss & ear fullness). Associated symptoms include congestion (scant), ear pain, sinus pressure and a sore throat (c/o post-nasal drip). Pertinent negatives include no chills, coughing, neck pain, shortness of breath or sneezing. Past treatments include oral decongestants (used phenylephrine & tylenol few times.). The treatment provided no relief.  Otalgia  Associated symptoms include hearing loss and a sore throat (c/o post-nasal drip). Pertinent negatives include no coughing, ear discharge, neck pain, rhinorrhea or vomiting.      Review of Systems  Constitutional: Positive for fatigue. Negative for fever, chills and appetite change.  HENT: Positive for hearing loss, ear pain, congestion (scant), sore throat (c/o post-nasal drip), sinus pressure and tinnitus. Negative for nosebleeds, rhinorrhea, sneezing, neck pain, voice change and ear discharge.   Respiratory: Negative for cough, shortness of breath and wheezing.   Cardiovascular: Negative for chest pain.  Gastrointestinal: Negative for nausea and vomiting.  Allergic/Immunologic: Positive for environmental allergies.  Neurological: Positive for dizziness.       Objective:   Physical Exam  Vitals reviewed. Constitutional: She is oriented to person, place, and time. She appears well-developed and well-nourished. No distress (frustrated that symptoms not better).  HENT:  Head: Normocephalic and atraumatic.  Right Ear: Tympanic membrane, external ear and ear canal normal. No drainage, swelling or tenderness. No mastoid tenderness. Tympanic membrane is not injected, not  scarred, not perforated and not erythematous. No middle ear effusion.  Left Ear: Tympanic membrane, external ear and ear canal normal. No drainage, swelling or tenderness. No mastoid tenderness. Tympanic membrane is not injected, not scarred, not perforated and not erythematous.  No middle ear effusion.  Mouth/Throat: No oropharyngeal exudate.  Eyes: Conjunctivae are normal. Right eye exhibits no discharge. Left eye exhibits no discharge.  Cardiovascular: Normal rate, regular rhythm and normal heart sounds.   Pulmonary/Chest: Effort normal and breath sounds normal. She has no wheezes.  Musculoskeletal:  Normal gait  Lymphadenopathy:    She has no cervical adenopathy.  Neurological: She is alert and oriented to person, place, and time.  Skin: Skin is warm and dry.  Psychiatric: She has a normal mood and affect. Her behavior is normal. Thought content normal.          Assessment & Plan:  1. Eustachian tube dysfunction, right See pt instructions. fluticasone (FLONASE) 50 MCG/ACT nasal spray; 1 spray each nostril q12h qd for next 10-14 days.  Dispense: 16 g; Refill: 0 - Ambulatory referral to ENT

## 2013-01-17 ENCOUNTER — Other Ambulatory Visit: Payer: Self-pay | Admitting: Family

## 2013-01-18 NOTE — Telephone Encounter (Signed)
OK to send #30 no refills.  

## 2013-01-18 NOTE — Telephone Encounter (Signed)
Refill request for Lunesta Last filled by MD on - 07/15/12 #30 x0 Last seen- 12/08/12 Next appt- none Please advise refill?

## 2013-01-19 NOTE — Telephone Encounter (Signed)
Rx called to pharmacy voicemail. 

## 2013-01-26 ENCOUNTER — Other Ambulatory Visit: Payer: Self-pay | Admitting: Family

## 2013-01-26 NOTE — Telephone Encounter (Signed)
See 01/17/13 refill note. Notified pharmacist this was called in 01/18/13, #30 x no refills.

## 2013-01-31 ENCOUNTER — Other Ambulatory Visit: Payer: Self-pay | Admitting: Family

## 2013-06-14 ENCOUNTER — Encounter: Payer: Self-pay | Admitting: Physician Assistant

## 2013-06-14 ENCOUNTER — Ambulatory Visit (INDEPENDENT_AMBULATORY_CARE_PROVIDER_SITE_OTHER): Payer: PRIVATE HEALTH INSURANCE | Admitting: Physician Assistant

## 2013-06-14 VITALS — BP 108/78 | HR 87 | Temp 98.3°F | Resp 16 | Ht 67.0 in | Wt 156.0 lb

## 2013-06-14 DIAGNOSIS — B9789 Other viral agents as the cause of diseases classified elsewhere: Principal | ICD-10-CM

## 2013-06-14 DIAGNOSIS — R52 Pain, unspecified: Secondary | ICD-10-CM

## 2013-06-14 DIAGNOSIS — R5383 Other fatigue: Secondary | ICD-10-CM

## 2013-06-14 DIAGNOSIS — R5381 Other malaise: Secondary | ICD-10-CM

## 2013-06-14 DIAGNOSIS — J069 Acute upper respiratory infection, unspecified: Secondary | ICD-10-CM | POA: Insufficient documentation

## 2013-06-14 LAB — POCT INFLUENZA A/B
INFLUENZA A, POC: NEGATIVE
INFLUENZA B, POC: NEGATIVE

## 2013-06-14 NOTE — Assessment & Plan Note (Signed)
Flu swab negative.  Supportive measures -- Rest, Increase fluid intake, saline nasal spray, Mucinex D, probiotic, Delsym for cough, humidifier in bedroom, multivitamin, tylenol if fever develops and for throat pain.  Return to clinic if symptoms not improving.

## 2013-06-14 NOTE — Progress Notes (Signed)
Pre visit review using our clinic review tool, if applicable. No additional management support is needed unless otherwise documented below in the visit note/SLS  

## 2013-06-14 NOTE — Patient Instructions (Signed)
Increase fluid intake.  Rest.  Saline nasal spray.  Mucinex D for congestion.  Take a daily probiotic.  Place a humidifier in your bedroom.  Take a daily multivitamin or zinc supplement while symptomatic.  Please call or return to clinic if symptoms not improving.

## 2013-06-14 NOTE — Addendum Note (Signed)
Addended by: Rockwell Germany on: 06/14/2013 09:50 AM   Modules accepted: Orders

## 2013-06-14 NOTE — Progress Notes (Signed)
Patient ID: Pamela Sanders, female   DOB: 02/23/1966, 48 y.o.   MRN: [id number]  Patient presents to clinic today complaining of 3 days of scratchy throat, fatigue, nasal congestion, dry cough and muscle aches.  Patient denies fever or chills.  Symptom onset was gradual. Denies shortness of breath, wheezing, sinus pain, ear pain.  Patient does have a coworker who tested positive for influenza.  Patient has had her flu shot. Is tolerating PO fluids.  Has used saline nasal spray for congestion.  Has not used other OTC measures for symptom relief.  Past Medical History  Diagnosis Date  . Allergic rhinitis   . Depression (emotion)   . History of diverticulitis of colon   . GERD (gastroesophageal reflux disease)   . Osteoarthritis   . Migraine   . DJD (degenerative joint disease)     chronic neck pain    Current Outpatient Prescriptions on File Prior to Visit  Medication Sig Dispense Refill  . calcium citrate-vitamin D (CITRACAL+D) 315-200 MG-UNIT per tablet Take 1 tablet by mouth 2 (two) times daily.        . eszopiclone (LUNESTA) 2 MG TABS tablet TAKE 1 TABLET PO IMMEDIATELY BEFORE BEDTIME PRN  30 tablet  0  . gabapentin (NEURONTIN) 300 MG capsule Take 1 capsule (300 mg total) by mouth 2 (two) times daily.  4 capsule  0  . loratadine (CLARITIN) 10 MG tablet Take 10 mg by mouth daily.        . Multiple Vitamins-Minerals (CENTRUM PO) Take by mouth daily.        . ondansetron (ZOFRAN) 4 MG tablet Take 1 tablet (4 mg total) by mouth every 8 (eight) hours as needed for nausea.  20 tablet  0  . SUMAtriptan (IMITREX) 50 MG tablet Take 50 mg by mouth every 2 (two) hours as needed.        . WELLBUTRIN XL 150 MG 24 hr tablet TAKE 2 TABLETS BY MOUTH ONCE DAILY  180 tablet  1   No current facility-administered medications on file prior to visit.    Allergies  Allergen Reactions  . Penicillins     Family History  Problem Relation Age of Onset  . Arthritis    . Breast cancer    . Coronary artery  disease Maternal Grandmother   . Hypertension      History   Social History  . Marital Status: Single    Spouse Name: N/A    Number of Children: N/A  . Years of Education: N/A   Social History Main Topics  . Smoking status: Never Smoker   . Smokeless tobacco: Never Used  . Alcohol Use: Yes     Comment: 2 drinks weekly  . Drug Use: No  . Sexual Activity: None   Other Topics Concern  . 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  All other ROS are negative.  Filed Vitals:   06/14/13 0906  BP: 108/78  Pulse: 87  Temp: 98.3 F (36.8 C)  Resp: 16   Physical Exam  Vitals reviewed. Constitutional: She is oriented to person, place, and time and well-developed, well-nourished,  and in no distress.  HENT:  Head: Normocephalic and atraumatic.  Right Ear: External ear normal.  Left Ear: External ear normal.  Nose: Nose normal.  Mouth/Throat: Oropharynx is clear and moist. No oropharyngeal exudate.  TM within normal limits bilaterally.  No tenderness on percussion of sinuses.  Eyes: Conjunctivae and EOM are normal. Pupils are equal, round, and reactive to light.  Neck: Normal range of motion. Neck supple.  Cardiovascular: Normal rate, regular rhythm, normal heart sounds and intact distal pulses.   Pulmonary/Chest: Effort normal and breath sounds normal. No respiratory distress. She has no wheezes. She has no rales. She exhibits no tenderness.  Lymphadenopathy:    She has no cervical adenopathy.  Neurological: She is alert and oriented to person, place, and time.  Skin: Skin is warm and dry. No rash noted.  Psychiatric: Affect normal.    No results found for this or any previous visit (from the past 2160  hour(s)).  Assessment/Plan: No problem-specific assessment & plan notes found for this encounter.

## 2013-07-16 ENCOUNTER — Other Ambulatory Visit: Payer: Self-pay | Admitting: Family

## 2013-07-17 NOTE — Telephone Encounter (Signed)
eScribe request from Pamela Sanders for refill on Wellbutrin Last filled - 08.26.14, #[id number] Last AEX - 07.01.14 [only acute w/other providers after] Next AEX - None Please Advise on refills/SLS

## 2013-07-17 NOTE — Telephone Encounter (Signed)
Appointment scheduled for 08/07/13

## 2013-07-17 NOTE — Telephone Encounter (Signed)
Please notify and schedule an appt per [facility]

## 2013-07-17 NOTE — Telephone Encounter (Signed)
Left detailed message informing patient of medication refill and that she needs to be seen before further refills can be given.

## 2013-07-17 NOTE — Telephone Encounter (Signed)
I sent 90 day supply but pls let her know she will need OV prior to additional refills.

## 2013-08-07 ENCOUNTER — Ambulatory Visit: Payer: PRIVATE HEALTH INSURANCE | Admitting: Family

## 2013-08-09 ENCOUNTER — Ambulatory Visit (INDEPENDENT_AMBULATORY_CARE_PROVIDER_SITE_OTHER): Payer: PRIVATE HEALTH INSURANCE | Admitting: Family

## 2013-08-09 ENCOUNTER — Encounter: Payer: Self-pay | Admitting: Family

## 2013-08-09 VITALS — BP 102/70 | HR 68 | Temp 97.8°F | Resp 16 | Ht 67.0 in | Wt 156.0 lb

## 2013-08-09 DIAGNOSIS — Z0000 Encounter for general adult medical examination without abnormal findings: Secondary | ICD-10-CM | POA: Insufficient documentation

## 2013-08-09 HISTORY — DX: Encounter for general adult medical examination without abnormal findings: Z00.00

## 2013-08-09 LAB — BASIC METABOLIC PANEL WITH GFR
BUN: 12 mg/dL (ref 6–23)
CALCIUM: 9.1 mg/dL (ref 8.4–10.5)
CO2: 27 mEq/L (ref 19–32)
Chloride: 106 mEq/L (ref 96–112)
Creat: 0.87 mg/dL (ref 0.50–1.10)
GFR, EST NON AFRICAN AMERICAN: 79 mL/min
GLUCOSE: 66 mg/dL — AB (ref 70–99)
Potassium: 4.2 mEq/L (ref 3.5–5.3)
SODIUM: 140 meq/L (ref 135–145)

## 2013-08-09 LAB — HEPATIC FUNCTION PANEL
ALBUMIN: 4.3 g/dL (ref 3.5–5.2)
ALK PHOS: 61 U/L (ref 39–117)
ALT: 16 U/L (ref 0–35)
AST: 21 U/L (ref 0–37)
BILIRUBIN TOTAL: 0.5 mg/dL (ref 0.2–1.2)
Bilirubin, Direct: 0.1 mg/dL (ref 0.0–0.3)
Indirect Bilirubin: 0.4 mg/dL (ref 0.2–1.2)
Total Protein: 6.5 g/dL (ref 6.0–8.3)

## 2013-08-09 LAB — CBC WITH DIFFERENTIAL/PLATELET
Basophils Absolute: 0 10*3/uL (ref 0.0–0.1)
Basophils Relative: 0 % (ref 0–1)
EOS ABS: 0.2 10*3/uL (ref 0.0–0.7)
Eosinophils Relative: 4 % (ref 0–5)
HCT: 39.1 % (ref 36.0–46.0)
Hemoglobin: 13.2 g/dL (ref 12.0–15.0)
LYMPHS ABS: 1.3 10*3/uL (ref 0.7–4.0)
Lymphocytes Relative: 27 % (ref 12–46)
MCH: 28.8 pg (ref 26.0–34.0)
MCHC: 33.8 g/dL (ref 30.0–36.0)
MCV: 85.2 fL (ref 78.0–100.0)
Monocytes Absolute: 0.5 10*3/uL (ref 0.1–1.0)
Monocytes Relative: 10 % (ref 3–12)
NEUTROS PCT: 59 % (ref 43–77)
Neutro Abs: 2.8 10*3/uL (ref 1.7–7.7)
PLATELETS: 214 10*3/uL (ref 150–400)
RBC: 4.59 MIL/uL (ref 3.87–5.11)
RDW: 13.7 % (ref 11.5–15.5)
WBC: 4.8 10*3/uL (ref 4.0–10.5)

## 2013-08-09 LAB — LIPID PANEL
CHOL/HDL RATIO: 3.8 ratio
CHOLESTEROL: 181 mg/dL (ref 0–200)
HDL: 48 mg/dL (ref >39)
LDL Cholesterol: 113 mg/dL — ABNORMAL HIGH (ref 0–99)
Triglycerides: 101 mg/dL (ref <150)
VLDL: 20 mg/dL (ref 0–40)

## 2013-08-09 MED ORDER — ESZOPICLONE 2 MG PO TABS: ORAL_TABLET | ORAL | Status: DC

## 2013-08-09 NOTE — Patient Instructions (Signed)
Please complete your lab work prior to leaving. Follow up in 6 months.

## 2013-08-09 NOTE — Progress Notes (Signed)
Subjective:    Patient ID: Pamela Sanders, female    DOB: July 30, 1965, 48 y.o.   MRN: [id number]  HPI  Patient presents today for complete physical.  Immunizations: up to date Diet: good diet Exercise: not often.  Colonoscopy: due in 2017 Pap Smear: up to date- per gyn, normal per pt.  Mammogram: normal, due in May 2015  Wt Readings from Last 3 Encounters:  08/09/13 156 lb (70.761 kg)  06/14/13 156 lb (70.761 kg)  12/08/12 160 lb (72.576 kg)     Review of Systems  Constitutional: Negative for unexpected weight change.  HENT: Negative for postnasal drip.   Respiratory: Negative for cough and shortness of breath.   Cardiovascular: Negative for chest pain and leg swelling.  Gastrointestinal:       Gerd symptoms  Genitourinary: Negative for dysuria, frequency and menstrual problem.  Musculoskeletal:       Some neck pain  Neurological: Negative for headaches.  Psychiatric/Behavioral:       Depression controlled on wellbutrin, not sleeping well.  She takes lunesta sometimes,  taking melatonin.     Past Medical History  Diagnosis Date  . Allergic rhinitis   . Depression (emotion)   . History of diverticulitis of colon   . GERD (gastroesophageal reflux disease)   . Osteoarthritis   . Migraine   . DJD (degenerative joint disease)     chronic neck pain    History   Social History  . Marital Status: Single    Spouse Name: N/A    Number of Children: N/A  . Years of Education: N/A   Occupational History  . Not on file.   Social History Main Topics  . Smoking status: Never Smoker   . Smokeless tobacco: Never Used  . Alcohol Use: Yes     Comment: 2 drinks weekly  . Drug Use: No  . Sexual Activity: Not on file   Other Topics Concern  . 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 Appendectomy    . Tmj arthroscopy      TMJ surgery  . Ovarian cyst surgery      abdominal surgery for ovarian cyst  . Cervical discectomy      Dr. Harl Bowie 06/2011[facility]    Family History  Problem Relation Age of Onset  . Arthritis    . Breast cancer    . Coronary artery disease Maternal Grandmother   . Hypertension      Allergies  Allergen Reactions  . Penicillins     Current Outpatient Prescriptions on File Prior to Visit  Medication Sig Dispense Refill  . calcium citrate-vitamin D (CITRACAL+D) 315-200 MG-UNIT per tablet Take 1 tablet by mouth 2 (two) times daily.        . fluticasone (FLONASE) 50 MCG/ACT nasal spray daily as needed. 1 spray each nostril q12h qd for next 10-14 days.      Marland Kitchen gabapentin (NEURONTIN) 300 MG capsule Take 1 capsule (300 mg total) by mouth 2 (two) times daily.  4 capsule  0  . loratadine (CLARITIN) 10 MG tablet Take 10  mg by mouth daily.        . Multiple Vitamins-Minerals (CENTRUM PO) Take by mouth daily.        Marland Kitchen omeprazole (PRILOSEC OTC) 20 MG tablet Take 20 mg by mouth daily as needed.      . OnabotulinumtoxinA (BOTOX IJ) Inject as directed every 3 (three) months. Migraine Maintenance.      . ondansetron (ZOFRAN) 4 MG tablet Take 1 tablet (4 mg total) by mouth every 8 (eight) hours as needed for nausea.  20 tablet  0  . SUMAtriptan (IMITREX) 50 MG tablet Take 100 mg by mouth every 2 (two) hours as needed.       . WELLBUTRIN XL 150 MG 24 hr tablet TAKE 2 TABLETS BY MOUTH ONCE DAILY  180 tablet  0   No current facility-administered medications on file prior to visit.    BP 102/70  Pulse 68  Temp(Src) 97.8 F (36.6 C) (Oral)  Resp 16  Ht 5\' 7"  (1.702 m)  Wt 156 lb (70.761 kg)  BMI 24.43 kg/m2  SpO2 99%  LMP 07/19/2013        Objective:   Physical Exam  Physical Exam  Constitutional: She is oriented to person, place, and time.  She appears well-developed and well-nourished. No distress.  HENT:  Head: Normocephalic and atraumatic.  Right Ear: Tympanic membrane and ear canal normal.  Left Ear: Tympanic membrane and ear canal normal.  Mouth/Throat: Oropharynx is clear and moist.  Eyes: Pupils are equal, round, and reactive to light. No scleral icterus.  Neck: Normal range of motion. No thyromegaly present.  Cardiovascular: Normal rate and regular rhythm.   No murmur heard. Pulmonary/Chest: Effort normal and breath sounds normal. No respiratory distress. He has no wheezes. She has no rales. She exhibits no tenderness.  Abdominal: Soft. Bowel sounds are normal. He exhibits no distension and no mass. There is no tenderness. There is no rebound and no guarding.  Musculoskeletal: She exhibits no edema.  Lymphadenopathy:    She has no cervical adenopathy.  Neurological: She is alert and oriented to person, place, and time. She has normal patellar reflexes. She exhibits normal muscle tone. Coordination normal.  Skin: Skin is warm and dry.  Psychiatric: She has a normal mood and affect. Her behavior is normal. Judgment and thought content normal.  Breast/pelvic: deferred to GYN     Assessment & Plan:         Assessment & Plan:

## 2013-08-09 NOTE — Progress Notes (Signed)
Pre visit review using our clinic review tool, if applicable. No additional management support is needed unless otherwise documented below in the visit note. 

## 2013-08-09 NOTE — Assessment & Plan Note (Signed)
Discussed healthy diet, exercise.  Immunizations up to date.  Obtain fasting lab work.

## 2013-08-10 LAB — URINALYSIS, ROUTINE W REFLEX MICROSCOPIC
Bilirubin Urine: NEGATIVE
GLUCOSE, UA: NEGATIVE mg/dL
HGB URINE DIPSTICK: NEGATIVE
KETONES UR: NEGATIVE mg/dL
Leukocytes, UA: NEGATIVE
Nitrite: NEGATIVE
PH: 7 (ref 5.0–8.0)
Protein, ur: NEGATIVE mg/dL
Specific Gravity, Urine: <1.005 — ABNORMAL LOW (ref 1.005–1.030)
Urobilinogen, UA: 0.2 mg/dL (ref 0.0–1.0)

## 2013-08-10 LAB — TSH: TSH: 3.118 u[IU]/mL (ref 0.350–4.500)

## 2013-08-12 ENCOUNTER — Encounter: Payer: Self-pay | Admitting: Family

## 2013-10-18 ENCOUNTER — Other Ambulatory Visit: Payer: Self-pay | Admitting: Family

## 2013-11-06 ENCOUNTER — Encounter: Payer: Self-pay | Admitting: Family

## 2013-11-06 ENCOUNTER — Ambulatory Visit (INDEPENDENT_AMBULATORY_CARE_PROVIDER_SITE_OTHER): Payer: PRIVATE HEALTH INSURANCE | Admitting: Family

## 2013-11-06 VITALS — BP 104/70 | HR 84 | Temp 97.9°F | Resp 18 | Ht 67.0 in | Wt 157.1 lb

## 2013-11-06 DIAGNOSIS — J069 Acute upper respiratory infection, unspecified: Secondary | ICD-10-CM | POA: Insufficient documentation

## 2013-11-06 NOTE — Assessment & Plan Note (Signed)
Symptoms most likely viral at this point.  Recommend Start Claritin D Add flonase 2 sprays each nostril once daily. Call if symptoms worsen, or if not improved in 3 days.

## 2013-11-06 NOTE — Progress Notes (Signed)
Pre visit review using our clinic review tool, if applicable. No additional management support is needed unless otherwise documented below in the visit note. 

## 2013-11-06 NOTE — Progress Notes (Signed)
Subjective:    Patient ID: Pamela Sanders, female    DOB: [birth year], 48 y.o.   MRN: [id number]  HPI Began having fatigue and sore throat 4 days ago. 3 days ago chest congestion and productive cough (clear to slightly yellow). 2 days ago started runny nose (clear) and congestion. Had low grade fever yesterday- 99 degrees. Fatigue is getting worse.  Also complains of headache/dizziness. Does have some ear pain/pressure. States partner has been sick for over a week with this same type illness.   Review of Systems  Constitutional: Positive for appetite change.       Has felt hot but no chills.  HENT: Positive for congestion, ear pain, postnasal drip, rhinorrhea, sinus pressure, sneezing and sore throat.        Teeth hurt.  Eyes: Negative for discharge, redness and itching.  Respiratory: Positive for cough and chest tightness. Negative for shortness of breath.    Started sneezing yesterday. Denies itchy/watery eyes. Has "burning" in chest from coughing.  Past Medical History  Diagnosis Date  . Allergic rhinitis   . Depression (emotion)   . History of diverticulitis of colon   . GERD (gastroesophageal reflux disease)   . Osteoarthritis   . Migraine   . DJD (degenerative joint disease)     chronic neck pain    History   Social History  . Marital Status: Single    Spouse Name: N/A    Number of Children: N/A  . Years of Education: N/A   Occupational History  . Not on file.   Social History Main Topics  . Smoking status: Never Smoker   . Smokeless tobacco: Never Used  . Alcohol Use: Yes     Comment: 2 drinks weekly  . Drug Use: No  . Sexual Activity: Not on file   Other Topics Concern  . 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 Appendectomy    . Tmj arthroscopy      TMJ surgery  . Ovarian cyst surgery      abdominal surgery for ovarian cyst  . Cervical discectomy      Dr. Harl Bowie 06/2011[facility]    Family History  Problem Relation Age of Onset  . Arthritis    . Breast cancer    . Coronary artery disease Maternal Grandmother   . Hypertension      Allergies  Allergen Reactions  . Penicillins     Current Outpatient Prescriptions on File Prior to Visit  Medication Sig Dispense Refill  . calcium citrate-vitamin D (CITRACAL+D) 315-200 MG-UNIT per tablet Take 1 tablet by mouth 2 (two) times daily.        . eszopiclone (LUNESTA) 2 MG TABS tablet TAKE 1 TABLET PO IMMEDIATELY BEFORE BEDTIME PRN  30 tablet  0  . ETODOLAC PO Take 1 tablet by mouth 2 (two) times daily as needed.      . fluticasone (FLONASE) 50 MCG/ACT nasal spray daily as needed. 1 spray each nostril q12h qd for next 10-14 days.      Marland Kitchen gabapentin (NEURONTIN) 300 MG capsule Take 1 capsule (300  mg total) by mouth 2 (two) times daily.  4 capsule  0  . loratadine (CLARITIN) 10 MG tablet Take 10 mg by mouth daily.        . Multiple Vitamins-Minerals (CENTRUM PO) Take by mouth daily.        Marland Kitchen omeprazole (PRILOSEC OTC) 20 MG tablet Take 20 mg by mouth daily as needed.      . OnabotulinumtoxinA (BOTOX IJ) Inject as directed every 3 (three) months. Migraine Maintenance.      . SUMAtriptan (IMITREX) 50 MG tablet Take 100 mg by mouth every 2 (two) hours as needed.       . WELLBUTRIN XL 150 MG 24 hr tablet TAKE 2 TABLETS BY MOUTH ONCE DAILY  180 tablet  1   No current facility-administered medications on file prior to visit.    BP 104/70  Pulse 84  Temp(Src) 97.9 F (36.6 C) (Oral)  Resp 18  Ht 5\' 7"  (1.702 m)  Wt 157 lb 1.3 oz (71.251 kg)  BMI 24.60 kg/m2  SpO2 99%       Objective:   Physical Exam  Constitutional: She appears well-developed and well-nourished. No distress.  HENT:  Head:  Normocephalic and atraumatic.  Mouth/Throat: No oropharyngeal exudate.  Bilateral TM pearly gray. Throat slightly red.  Eyes: Pupils are equal, round, and reactive to light. Right eye exhibits no discharge. Left eye exhibits no discharge.  Cardiovascular: Normal rate, regular rhythm and normal heart sounds.   Pulmonary/Chest: Effort normal and breath sounds normal. No respiratory distress. She has no wheezes. She has no rales.  Lymphadenopathy:    She has no cervical adenopathy.  Skin: Skin is warm and dry. She is not diaphoretic.  Psychiatric: Her behavior is normal.          Assessment & Plan:  Patient seen by [facility] NP-student.  I have personally seen and examined patient and agree with Ms. Whitmire's assessment and plan.

## 2013-11-06 NOTE — Patient Instructions (Signed)
Start Claritin D Add flonase 2 sprays each nostril once daily. Call if symptoms worsen, or if not improved in 3 days.  Upper Respiratory Infection, Adult An upper respiratory infection (URI) is also known as the common cold. It is often caused by a type of germ (virus). Colds are easily spread (contagious). You can pass it to others by kissing, coughing, sneezing, or drinking out of the same glass. Usually, you get better in 1 or 2 weeks.  HOME CARE   Only take medicine as told by your doctor.  Use a warm mist humidifier or breathe in steam from a hot shower.  Drink enough water and fluids to keep your pee (urine) clear or pale yellow.  Get plenty of rest.  Return to work when your temperature is back to normal or as told by your doctor. You may use a face mask and wash your hands to stop your cold from spreading. GET HELP RIGHT AWAY IF:   After the first few days, you feel you are getting worse.  You have questions about your medicine.  You have chills, shortness of breath, or brown or red spit (mucus).  You have yellow or brown snot (nasal discharge) or pain in the face, especially when you bend forward.  You have a fever, puffy (swollen) neck, pain when you swallow, or white spots in the back of your throat.  You have a bad headache, ear pain, sinus pain, or chest pain.  You have a high-pitched whistling sound when you breathe in and out (wheezing).  You have a lasting cough or cough up blood.  You have sore muscles or a stiff neck. MAKE SURE YOU:   Understand these instructions.  Will watch your condition.  Will get help right away if you are not doing well or get worse. Document Released: 11/11/2007 Document Revised: 08/17/2011 Document Reviewed: 09/29/2010 [facility] Patient Information 2014 [place], [place].

## 2013-11-15 ENCOUNTER — Telehealth: Payer: Self-pay | Admitting: *Deleted

## 2013-11-15 NOTE — Telephone Encounter (Signed)
Notified pt. She states she has been taking sudafed since she saw [place]. Advised her she would need to be seen for re-evaluation. Pt unable to come in tomorrow and scheduled appt for Friday at 7:45am.

## 2013-11-15 NOTE — Telephone Encounter (Signed)
I would recommend trial of sudafed.  If not improved in 2-3 days, should be seen in office.

## 2013-11-15 NOTE — Telephone Encounter (Signed)
Received call from pt stating she was diagnosed with a viral illness (cough) on 11/06/13. Reports that symptoms improved for 3-4 days but now feels like it has moved to her head.  Still has slight productive cough. Now has head/nasal congestion.  Please advise.

## 2013-11-17 ENCOUNTER — Encounter: Payer: Self-pay | Admitting: Family

## 2013-11-17 ENCOUNTER — Ambulatory Visit (INDEPENDENT_AMBULATORY_CARE_PROVIDER_SITE_OTHER): Payer: PRIVATE HEALTH INSURANCE | Admitting: Family

## 2013-11-17 VITALS — BP 110/62 | HR 91 | Temp 98.0°F | Resp 16 | Ht 67.0 in | Wt 159.1 lb

## 2013-11-17 DIAGNOSIS — J329 Chronic sinusitis, unspecified: Secondary | ICD-10-CM | POA: Insufficient documentation

## 2013-11-17 MED ORDER — CLARITHROMYCIN 500 MG PO TABS: 500.0000 mg | ORAL_TABLET | Freq: Two times a day (BID) | ORAL | Status: DC

## 2013-11-17 NOTE — Patient Instructions (Signed)
Start biaxin.  Call if symptoms worsen or if not improved in 2-3 days.

## 2013-11-17 NOTE — Progress Notes (Signed)
Subjective:    Patient ID: Pamela Sanders, female    DOB: Oct 25, 1965, 48 y.o.   MRN: [id number]  HPI  Pamela Sanders is a 48 yr old female who presents today with chief complaint of nasal congestion.  She was initially seen on 11/06/13 with cough and runny nose.  At that time symptoms appeared viral. Since that time she has developed fascial pain and yellow nasal discharge. She has been using OTC sudafed without improvement in her symptoms.   Review of Systems    see HPI  Past Medical History  Diagnosis Date  . Allergic rhinitis   . Depression (emotion)   . History of diverticulitis of colon   . GERD (gastroesophageal reflux disease)   . Osteoarthritis   . Migraine   . DJD (degenerative joint disease)     chronic neck pain    History   Social History  . Marital Status: Single    Spouse Name: N/A    Number of Children: N/A  . Years of Education: N/A   Occupational History  . Not on file.   Social History Main Topics  . Smoking status: Never Smoker   . Smokeless tobacco: Never Used  . Alcohol Use: Yes     Comment: 2 drinks weekly  . Drug Use: No  . Sexual Activity: Not on file   Other Topics Concern  . 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 Appendectomy    . Tmj arthroscopy      TMJ surgery  . Ovarian cyst surgery      abdominal surgery for ovarian cyst  . Cervical discectomy      Dr. Harl Bowie 06/2011[facility] [place] [facility]    Family History  Problem Relation Age of Onset  . Arthritis    . Breast cancer    . Coronary artery disease Maternal Grandmother   . Hypertension      Allergies  Allergen Reactions  . Penicillins     Current Outpatient Prescriptions on File Prior to Visit    Medication Sig Dispense Refill  . calcium citrate-vitamin D (CITRACAL+D) 315-200 MG-UNIT per tablet Take 1 tablet by mouth 2 (two) times daily.        . eszopiclone (LUNESTA) 2 MG TABS tablet TAKE 1 TABLET PO IMMEDIATELY BEFORE BEDTIME PRN  30 tablet  0  . ETODOLAC PO Take 1 tablet by mouth 2 (two) times daily as needed.      . fluticasone (FLONASE) 50 MCG/ACT nasal spray daily as needed. 1 spray each nostril q12h qd for next 10-14 days.      Marland Kitchen gabapentin (NEURONTIN) 300 MG capsule Take 1 capsule (300 mg total) by mouth 2 (two) times daily.  4 capsule  0  . loratadine (CLARITIN) 10 MG tablet Take 10 mg by mouth daily.        . Multiple Vitamins-Minerals (CENTRUM PO) Take by mouth daily.        Marland Kitchen omeprazole (PRILOSEC OTC) 20 MG tablet Take 20 mg by mouth daily as needed.      . OnabotulinumtoxinA (BOTOX IJ) Inject as directed every 3 (  three) months. Migraine Maintenance.      . SUMAtriptan (IMITREX) 50 MG tablet Take 100 mg by mouth every 2 (two) hours as needed.       . WELLBUTRIN XL 150 MG 24 hr tablet TAKE 2 TABLETS BY MOUTH ONCE DAILY  180 tablet  1   No current facility-administered medications on file prior to visit.    BP 110/62  Pulse 91  Temp(Src) 98 F (36.7 C) (Oral)  Resp 16  Ht 5\' 7"  (1.702 m)  Wt 159 lb 1.9 oz (72.176 kg)  BMI 24.92 kg/m2  SpO2 99%    Objective:   Physical Exam  Constitutional: She is oriented to person, place, and time. She appears well-developed and well-nourished. No distress.  HENT:  Head: Normocephalic and atraumatic.  Right Ear: Tympanic membrane and ear canal normal.  L TM- dull with some yellow fluid noted behind TM Frontal and maxillary sinus tenderness to palpation.  Cardiovascular: Normal rate and regular rhythm.   No murmur heard. Pulmonary/Chest: Effort normal and breath sounds normal.  Musculoskeletal: She exhibits no edema.  Neurological: She is alert and oriented to person, place, and time.  Skin: Skin is warm and dry.   Psychiatric: She has a normal mood and affect. Her behavior is normal. Judgment and thought content normal.          Assessment & Plan:

## 2013-11-17 NOTE — Assessment & Plan Note (Signed)
Will rx with biaxin. Call if symptoms worsen or if symptoms do not improve in 2-3 days.

## 2013-11-17 NOTE — Progress Notes (Signed)
Pre visit review using our clinic review tool, if applicable. No additional management support is needed unless otherwise documented below in the visit note. 

## 2013-11-20 ENCOUNTER — Telehealth: Payer: Self-pay | Admitting: Family

## 2013-11-20 DIAGNOSIS — J329 Chronic sinusitis, unspecified: Secondary | ICD-10-CM

## 2013-11-20 MED ORDER — AZITHROMYCIN 250 MG PO TABS: ORAL_TABLET | ORAL | Status: DC

## 2013-11-20 NOTE — Telephone Encounter (Signed)
STOP biaxin.  Rx for Azithromycin for sinus infection and ear infection. Start a probiotic.

## 2013-11-20 NOTE — Telephone Encounter (Signed)
Patient states that she is having side effects to the antibiotic that was given to her last week. She states that she is having stomach pains, nausea, and acid reflux.

## 2013-11-20 NOTE — Telephone Encounter (Signed)
Please advise 

## 2013-11-20 NOTE — Telephone Encounter (Signed)
Patient advised to stop biaxin and begin azithromycin and a probiotic.  Patient voiced understanding.

## 2014-01-15 ENCOUNTER — Telehealth: Payer: Self-pay | Admitting: *Deleted

## 2014-01-15 MED ORDER — ESZOPICLONE 2 MG PO TABS: ORAL_TABLET | ORAL | Status: DC

## 2014-01-15 NOTE — Telephone Encounter (Signed)
Received fax requesting refill of lunesta to [facility] out[facility] pharmacy. Last rx provided 08/09/13, #30. Pt last seen 11/2013.  Rx printed and forwarded to Provider for signature.

## 2014-01-16 NOTE — Telephone Encounter (Signed)
Rx faxed

## 2014-03-01 ENCOUNTER — Other Ambulatory Visit: Payer: Self-pay | Admitting: Family

## 2014-03-01 ENCOUNTER — Telehealth: Payer: Self-pay | Admitting: *Deleted

## 2014-03-01 MED ORDER — ESZOPICLONE 2 MG PO TABS: ORAL_TABLET | ORAL | Status: DC

## 2014-03-01 NOTE — Telephone Encounter (Signed)
Rx signed and faxed.

## 2014-03-01 NOTE — Telephone Encounter (Signed)
Refill sent on pt's lunesta. Pt was seen 08/09/13 and advised 6 month follow up. Pt is due for follow up now.  Please call pt to arrange f/u.

## 2014-03-01 NOTE — Telephone Encounter (Signed)
Left detailed message informing patient of medication refill and to call our office to schedule 6 month follow up.

## 2014-03-05 NOTE — Telephone Encounter (Signed)
Letter mailed to pt.  

## 2014-03-23 ENCOUNTER — Other Ambulatory Visit: Payer: Self-pay

## 2014-04-04 ENCOUNTER — Encounter: Payer: Self-pay | Admitting: Family

## 2014-04-04 ENCOUNTER — Ambulatory Visit (INDEPENDENT_AMBULATORY_CARE_PROVIDER_SITE_OTHER): Payer: PRIVATE HEALTH INSURANCE | Admitting: Family

## 2014-04-04 VITALS — BP 118/70 | HR 72 | Temp 98.0°F | Resp 16 | Ht 67.0 in | Wt 164.8 lb

## 2014-04-04 DIAGNOSIS — F329 Major depressive disorder, single episode, unspecified: Secondary | ICD-10-CM

## 2014-04-04 DIAGNOSIS — R442 Other hallucinations: Secondary | ICD-10-CM | POA: Insufficient documentation

## 2014-04-04 DIAGNOSIS — F32A Depression, unspecified: Secondary | ICD-10-CM

## 2014-04-04 MED ORDER — WELLBUTRIN XL 150 MG PO TB24: ORAL_TABLET | ORAL | Status: DC

## 2014-04-04 MED ORDER — ESZOPICLONE 2 MG PO TABS: ORAL_TABLET | ORAL | Status: DC

## 2014-04-04 NOTE — Assessment & Plan Note (Signed)
Stable on meds.  Continue with Wellbutrin.  Follow up in 6 months.

## 2014-04-04 NOTE — Progress Notes (Signed)
Pre visit review using our clinic review tool, if applicable. No additional management support is needed unless otherwise documented below in the visit note. 

## 2014-04-04 NOTE — Assessment & Plan Note (Signed)
I suspect that this is related to her migraines.  I have encouraged her to discuss this further with her neurologist.

## 2014-04-04 NOTE — Patient Instructions (Signed)
Please complete lab work prior to leaving. Talk with your neurologist re: phantom smells.  Let me know if this worsens. Follow up in 6 months.

## 2014-04-04 NOTE — Progress Notes (Signed)
Subjective:    Patient ID: Pamela Sanders, female    DOB: 01/07/1966, 48 y.o.   MRN: [id number]  HPI Pamela Sanders is a 48 yo female who presents today for follow up.  1. Depression and Anxiety- Currently takes Wellbutrin XL. Working well for her, takes with high compliance.  No depressive episodes or feeling hopeless.  She does not sleep well at all.  She has a hard time falling asleep.  She has taken Lunesta in the past and that has helped.  She takes this 3 - 4 times per week and can get 8 hours of rest.   2. Phantom Smells - Reports that she first noticed smelling cigarette smoke despite not being around any smokers.  This began three months ago. She also started smelling burnt chili as well.  She has tried irrigating her nose without relief.  Happens about 3 or 4 times per day.  There is no association with food.  Nothing seems to aggravate the condition or alleviate it.  Denies symptoms of sinusitis.   Review of Systems  Constitutional: Negative for chills, activity change and fatigue.  HENT: Negative.   Respiratory: Negative for cough, chest tightness and shortness of breath.   Cardiovascular: Negative for chest pain, palpitations and leg swelling.  Skin: Negative for pallor, rash and wound.  Neurological: Negative for dizziness, weakness and light-headedness.   Past Medical History  Diagnosis Date  . Allergic rhinitis   . Depression (emotion)   . History of diverticulitis of colon   . GERD (gastroesophageal reflux disease)   . Osteoarthritis   . Migraine   . DJD (degenerative joint disease)     chronic neck pain    History   Social History  . Marital Status: Single    Spouse Name: N/A    Number of Children: N/A  . Years of Education: N/A   Occupational History  . Not on file.   Social History Main Topics  . Smoking status: Never Smoker   . Smokeless tobacco: Never Used  . Alcohol Use: Yes     Comment: 2 drinks weekly  . Drug Use: No  . Sexual Activity: Not on  file   Other Topics Concern  . 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 Appendectomy    . Tmj arthroscopy      TMJ surgery  . Ovarian cyst surgery      abdominal surgery for ovarian cyst  . Cervical discectomy      Dr. Harl Bowie 06/2011[facility]    Family History  Problem Relation Age of Onset  . Arthritis    . Breast cancer    . Coronary artery disease Maternal Grandmother   . Hypertension      Allergies  Allergen Reactions  . Penicillins     Current Outpatient Prescriptions on File Prior to Visit  Medication Sig Dispense Refill  . calcium citrate-vitamin D (CITRACAL+D) 315-200 MG-UNIT per tablet Take 1 tablet by mouth 2 (two) times daily.        . ETODOLAC PO Take 1 tablet by mouth 2 (two) times  daily as needed.      . fluticasone (FLONASE) 50 MCG/ACT nasal spray daily as needed. 1 spray each nostril q12h qd for next 10-14 days.      Marland Kitchen gabapentin (NEURONTIN) 300 MG capsule Take 1 capsule (300 mg total) by mouth 2 (two) times daily.  4 capsule  0  . loratadine (CLARITIN) 10 MG tablet Take 10 mg by mouth daily.        . Multiple Vitamins-Minerals (CENTRUM PO) Take by mouth daily.        . SUMAtriptan (IMITREX) 50 MG tablet Take 100 mg by mouth every 2 (two) hours as needed.        No current facility-administered medications on file prior to visit.    BP 118/70  Pulse 72  Temp(Src) 98 F (36.7 C) (Oral)  Resp 16  Ht 5\' 7"  (1.702 m)  Wt 164 lb 12.8 oz (74.753 kg)  BMI 25.81 kg/m2  SpO2 99%       Objective:   Physical Exam  Constitutional: She is oriented to person, place, and time. She appears well-developed and well-nourished.  HENT:  Head: Normocephalic and atraumatic.    Cardiovascular: Normal rate, regular rhythm, normal heart sounds and intact distal pulses.   Pulmonary/Chest: Effort normal and breath sounds normal. No respiratory distress.  Musculoskeletal: Normal range of motion.  Neurological: She is alert and oriented to person, place, and time.  Skin: Skin is warm and dry.  Psychiatric: She has a normal mood and affect. Her behavior is normal.          Assessment & Plan:  Most likely phantom smells are related to patient's history of migranes.  Follow up with Neurology as needed.  Will need to follow up in 6 months for anxiety and depression.    I have personally seen and examined patient and agree with Jettie Booze NP student's assessment and plan. [name]

## 2014-05-08 ENCOUNTER — Ambulatory Visit (INDEPENDENT_AMBULATORY_CARE_PROVIDER_SITE_OTHER): Payer: PRIVATE HEALTH INSURANCE | Admitting: Medical

## 2014-05-08 ENCOUNTER — Encounter: Payer: Self-pay | Admitting: Medical

## 2014-05-08 VITALS — BP 127/85 | HR 75 | Temp 98.5°F | Ht 67.0 in | Wt 164.2 lb

## 2014-05-08 DIAGNOSIS — J0100 Acute maxillary sinusitis, unspecified: Secondary | ICD-10-CM

## 2014-05-08 DIAGNOSIS — R509 Fever, unspecified: Secondary | ICD-10-CM

## 2014-05-08 DIAGNOSIS — R05 Cough: Secondary | ICD-10-CM

## 2014-05-08 DIAGNOSIS — R059 Cough, unspecified: Secondary | ICD-10-CM | POA: Insufficient documentation

## 2014-05-08 LAB — POCT INFLUENZA A/B
INFLUENZA A, POC: NEGATIVE
INFLUENZA B, POC: NEGATIVE

## 2014-05-08 MED ORDER — BENZONATATE 100 MG PO CAPS: 100.0000 mg | ORAL_CAPSULE | Freq: Three times a day (TID) | ORAL | Status: DC | PRN

## 2014-05-08 MED ORDER — AZITHROMYCIN 250 MG PO TABS: ORAL_TABLET | ORAL | Status: DC

## 2014-05-08 MED ORDER — FLUTICASONE PROPIONATE 50 MCG/ACT NA SUSP: 2.0000 | Freq: Every day | NASAL | Status: DC

## 2014-05-08 NOTE — Progress Notes (Signed)
Pre visit review using our clinic review tool, if applicable. No additional management support is needed unless otherwise documented below in the visit note. 

## 2014-05-08 NOTE — Assessment & Plan Note (Signed)
Your appear to have a sinus infection iwith possible bronchitis. I am prescribing azithromycin  antibiotic for the infection. To help with the nasal congestion I prescribed nasal steroid. For your associated cough, I prescribed cough medicine benzonatate

## 2014-05-08 NOTE — Patient Instructions (Signed)
Your appear to have a sinus infection iwith possible bronchitis. I am prescribing azithromycin  antibiotic for the infection. To help with the nasal congestion I prescribed nasal steroid. For your associated cough, I prescribed cough medicine benzonatate  Rest, hydrate, tylenol for fever.  Follow up in 7 days or as needed.

## 2014-05-08 NOTE — Progress Notes (Signed)
Subjective:    Patient ID: Pamela Sanders, female    DOB: June 10, 1965, 48 y.o.   MRN: [id number]  HPI   Pt in today reporting chest congestion,   nasal congestion,  Productive cough runny nose  and sinus pressure for   5 Days.  Associated symptoms( below yes or no)  Fever-subjective early on. Chills-subjecive early on Chest congestion-yes. Sneezing- some. Itching eyes-none Sore throat- mild from cough and pnd. Post-nasal drainage-yes. Wheezing- no wheezing Purulent drainage-yes Fatigue-yes  October got flu vaccine.  Past Medical History  Diagnosis Date  . Allergic rhinitis   . Depression (emotion)   . History of diverticulitis of colon   . GERD (gastroesophageal reflux disease)   . Osteoarthritis   . Migraine   . DJD (degenerative joint disease)     chronic neck pain    History   Social History  . Marital Status: Single    Spouse Name: N/A    Number of Children: N/A  . Years of Education: N/A   Occupational History  . Not on file.   Social History Main Topics  . Smoking status: Never Smoker   . Smokeless tobacco: Never Used  . Alcohol Use: Yes     Comment: 2 drinks weekly  . Drug Use: No  . Sexual Activity: Not on file   Other Topics Concern  . 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 Appendectomy    . Tmj arthroscopy      TMJ surgery  . Ovarian cyst surgery      abdominal surgery for ovarian cyst  . Cervical discectomy      Dr. Harl Bowie 06/2011[facility]    Family History  Problem Relation Age of Onset  . Arthritis    . Breast cancer    . Coronary artery disease Maternal Grandmother   . Hypertension      Allergies  Allergen Reactions  . Penicillins      Current Outpatient Prescriptions on File Prior to Visit  Medication Sig Dispense Refill  . calcium citrate-vitamin D (CITRACAL+D) 315-200 MG-UNIT per tablet Take 1 tablet by mouth 2 (two) times daily.      . eszopiclone (LUNESTA) 2 MG TABS tablet TAKE 1 TABLET BY MOUTH IMMEDIATELY BEFORE BEDTIME AS NEEDED 30 tablet 0  . ETODOLAC PO Take 1 tablet by mouth 2 (two) times daily as needed.    . gabapentin (NEURONTIN) 300 MG capsule Take 1 capsule (300 mg total) by mouth 2 (two) times daily. 4 capsule 0  . loratadine (CLARITIN) 10 MG tablet Take 10 mg by mouth daily.      . Multiple Vitamins-Minerals (CENTRUM PO) Take by mouth daily.      . SUMAtriptan (IMITREX) 50 MG tablet Take 100 mg by mouth every 2 (two) hours as needed.     . WELLBUTRIN XL 150 MG 24 hr tablet TAKE 2 TABLETS BY MOUTH ONCE DAILY 180 tablet 1   No current facility-administered medications on file prior to visit.    BP 127/85 mmHg  Pulse 75  Temp(Src) 98.5 F (36.9 C) (Oral)  Ht 5'  7" (1.702 m)  Wt 164 lb 3.2 oz (74.481 kg)  BMI 25.71 kg/m2  SpO2 98%  LMP 04/08/2014         Review of Systems  Constitutional: Negative for fever, chills and fatigue.       Only early on.  HENT: Positive for congestion, postnasal drip, sinus pressure, sneezing and sore throat.   Respiratory: Positive for cough. Negative for wheezing.   Cardiovascular: Negative for chest pain and palpitations.  Musculoskeletal: Negative for neck pain.  Neurological: Negative for dizziness and headaches.  Hematological: Negative for adenopathy. Does not bruise/bleed easily.       Objective:   Physical Exam   General  Mental Status - Alert. General Appearance - Well groomed. Not in acute distress.  Skin Rashes- No Rashes.  HEENT Head- Normal. Ear Auditory Canal - Left- Normal. Right - Normal.Tympanic Membrane- Left- Normal. Right- Normal. Eye Sclera/Conjunctiva- Left- Normal. Right- Normal. Nose & Sinuses Nasal Mucosa- Left-   Boggy and Congested. Right-  Boggy and  Congested.Bilateral maxillary and frontal sinus pressure. Mouth & Throat Lips: Upper Lip- Normal: no dryness, cracking, pallor, cyanosis, or vesicular eruption. Lower Lip-Normal: no dryness, cracking, pallor, cyanosis or vesicular eruption. Buccal Mucosa- Bilateral- No Aphthous ulcers. Oropharynx- No Discharge or Erythema. Tonsils: Characteristics- Bilateral- No Erythema or Congestion. Size/Enlargement- Bilateral- No enlargement. Discharge- bilateral-None.  Neck Neck- Supple. No Masses. Mild submandibular node tender left side and mild hypertrophy.   Chest and Lung Exam Auscultation: Breath Sounds:-Clear even and unlabored.  Cardiovascular Auscultation:Rythm- Regular, rate and rhythm. Murmurs & Other Heart Sounds:Ausculatation of the heart reveal- No Murmurs.  Lymphatic Head & Neck General Head & Neck Lymphatics: Bilateral: Description- No Localized lymphadenopathy.         Assessment & Plan:

## 2014-07-20 ENCOUNTER — Other Ambulatory Visit: Payer: Self-pay | Admitting: Family

## 2014-07-20 NOTE — Telephone Encounter (Signed)
Last filled: 04/04/14 Amt: 30, 0  Last OV: 04/04/14 Lenna Sciara)  Please advise.

## 2014-07-21 NOTE — Telephone Encounter (Signed)
Ok to send 30 tabs zero refills. 

## 2014-07-23 NOTE — Telephone Encounter (Signed)
Rx called to pharmacy voicemail as below. 

## 2014-08-03 HISTORY — PX: COLONOSCOPY: SHX174

## 2014-10-18 ENCOUNTER — Other Ambulatory Visit: Payer: Self-pay | Admitting: Family

## 2014-10-19 NOTE — Telephone Encounter (Signed)
Rx faxed to pharmacy  

## 2014-10-19 NOTE — Telephone Encounter (Signed)
Wellbutrin Rx sent.  Lunesta Rx printed and forwarded to Provider for signature.  Pt was due for a 6 month follow up in April.

## 2015-01-21 ENCOUNTER — Telehealth: Payer: Self-pay | Admitting: Family

## 2015-01-21 MED ORDER — WELLBUTRIN XL 150 MG PO TB24: 300.0000 mg | ORAL_TABLET | Freq: Every day | ORAL | Status: DC

## 2015-01-21 NOTE — Telephone Encounter (Signed)
Refill sent, notified pt. 

## 2015-01-21 NOTE — Telephone Encounter (Signed)
Pt called in Pamela Sanders - (same as before but name changed), Ph# [phone] Pt has been trying to get refill on Wellbutrin XL 150mg  and the pharmacy states they have faxed [place] requests with no response.  Pt has enough meds til Friday. Takes 2 tabs 1xday in morning. Needs 90 day supply. Name brand ONLY.

## 2015-02-05 ENCOUNTER — Encounter: Payer: Self-pay | Admitting: Family

## 2015-02-05 ENCOUNTER — Ambulatory Visit (INDEPENDENT_AMBULATORY_CARE_PROVIDER_SITE_OTHER): Payer: PRIVATE HEALTH INSURANCE | Admitting: Family

## 2015-02-05 VITALS — BP 100/62 | HR 78 | Temp 97.9°F | Resp 16 | Ht 67.0 in | Wt 169.4 lb

## 2015-02-05 DIAGNOSIS — F32A Depression, unspecified: Secondary | ICD-10-CM

## 2015-02-05 DIAGNOSIS — M25552 Pain in left hip: Secondary | ICD-10-CM | POA: Diagnosis not present

## 2015-02-05 DIAGNOSIS — S030XXA Dislocation of jaw, initial encounter: Secondary | ICD-10-CM | POA: Diagnosis not present

## 2015-02-05 DIAGNOSIS — F329 Major depressive disorder, single episode, unspecified: Secondary | ICD-10-CM

## 2015-02-05 DIAGNOSIS — S0300XA Dislocation of jaw, unspecified side, initial encounter: Secondary | ICD-10-CM

## 2015-02-05 MED ORDER — ALPRAZOLAM 0.25 MG PO TABS: 0.2500 mg | ORAL_TABLET | Freq: Two times a day (BID) | ORAL | Status: DC | PRN

## 2015-02-05 MED ORDER — ESZOPICLONE 2 MG PO TABS: 2.0000 mg | ORAL_TABLET | Freq: Every evening | ORAL | Status: DC | PRN

## 2015-02-05 NOTE — Patient Instructions (Addendum)
Please go to the lab prior to leaving for urine test (UDS). You may use xanax as needed for severe anxiety symptoms. Please schedule a complete physical at your convenience.

## 2015-02-05 NOTE — Progress Notes (Signed)
Pre visit review using our clinic review tool, if applicable. No additional management support is needed unless otherwise documented below in the visit note. 

## 2015-02-05 NOTE — Progress Notes (Signed)
Subjective:    Patient ID: Pamela Sanders, female    DOB: [birth year], 49 y.o.   MRN: [id number]  HPI  [name] is a 49 yr old female who presents today for follow up of her depression.  She is currently maintained on wellbutrin xl.  More stress at work.  Feels overworked.  Feels like her mood changes are situational.   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  haad surger 1998.  Last 1 year has become more painful/clicking/tight.  Was given a night guard for HS, given lodine, not workign. Saw an oral Psychologist, sport and exercise. Told arthritis.  [place] [facility]. Sees him tonight. Recently given zanaflex.  Only helping some.  Reposts left hip pain radiates down the front her her leg, back own the buttock. Stiffness.  Has been present x 1 year.   Review of Systems See HPI  Past Medical History  Diagnosis Date  . Allergic rhinitis   . Depression (emotion)   . History of diverticulitis of colon   . GERD (gastroesophageal reflux disease)   . Osteoarthritis   . Migraine   . DJD (degenerative joint disease)     chronic neck pain    Social History   Social History  . Marital Status: Single    Spouse Name: N/A  . Number of Children: N/A  . Years of Education: N/A   Occupational History  . Not on file.   Social History Main Topics  . Smoking status: Never Smoker   . Smokeless tobacco: Never Used  . Alcohol Use: Yes     Comment: 2 drinks weekly  . Drug Use: No  . Sexual Activity: Not on file   Other Topics Concern  . 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 Appendectomy    . Tmj arthroscopy      TMJ surgery  . Ovarian cyst surgery      abdominal surgery for ovarian cyst  . Cervical discectomy      Dr. Harl Bowie 06/2011[facility]    Family History  Problem Relation Age of Onset  . Arthritis    . Breast cancer    . Coronary artery disease Maternal Grandmother   . Hypertension      Allergies  Allergen Reactions  . Penicillins Hives    Current Outpatient Prescriptions on File Prior to Visit  Medication Sig Dispense Refill  . calcium citrate-vitamin D (CITRACAL+D) 315-200 MG-UNIT per tablet Take 1 tablet by mouth 2 (two) times daily.      . ETODOLAC PO Take 1 tablet by mouth 2 (two) times daily as needed.    . fluticasone (FLONASE) 50 MCG/ACT nasal spray Place 2 sprays into both nostrils daily. 16 g 1  . gabapentin (NEURONTIN) 300 MG capsule Take 1 capsule (300 mg total) by mouth 2 (two) times daily. 4 capsule 0  . loratadine (CLARITIN) 10 MG tablet Take 10 mg by mouth daily.      . Multiple Vitamins-Minerals (CENTRUM PO) Take by mouth daily.      . SUMAtriptan (IMITREX) 50 MG tablet Take 100 mg by mouth every 2 (two) hours  as needed.     . WELLBUTRIN XL 150 MG 24 hr tablet Take 2 tablets (300 mg total) by mouth daily. 180 tablet 1   No current facility-administered medications on file prior to visit.    BP 100/62 mmHg  Pulse 78  Temp(Src) 97.9 F (36.6 C) (Oral)  Resp 16  Ht 5\' 7"  (1.702 m)  Wt 169 lb 6.4 oz (76.839 kg)  BMI 26.53 kg/m2  SpO2 99%  LMP 01/29/2015       Objective:   Physical Exam  Constitutional: She is oriented to person, place, and time. She appears well-developed and well-nourished.  HENT:  Head: Normocephalic and atraumatic.  Cardiovascular: Normal rate, regular rhythm and normal heart sounds.   No murmur heard. Pulmonary/Chest: Effort normal and breath sounds normal. No respiratory distress. She has no wheezes.  Musculoskeletal: She exhibits no edema.       Cervical back: She exhibits no tenderness.       Thoracic back: She exhibits no tenderness.       Lumbar back: She exhibits no tenderness.  Neurological: She is alert and oriented to person, place, and time.  She displays no atrophy. She exhibits normal muscle tone.  Reflex Scores:      Patellar reflexes are 2+ on the right side and 2+ on the left side. Psychiatric: She has a normal mood and affect. Her behavior is normal. Judgment and thought content normal.          Assessment & Plan:

## 2015-02-06 ENCOUNTER — Ambulatory Visit: Payer: PRIVATE HEALTH INSURANCE | Admitting: Family Medicine

## 2015-02-07 DIAGNOSIS — S0300XA Dislocation of jaw, unspecified side, initial encounter: Secondary | ICD-10-CM | POA: Insufficient documentation

## 2015-02-07 DIAGNOSIS — M25559 Pain in unspecified hip: Secondary | ICD-10-CM | POA: Insufficient documentation

## 2015-02-07 HISTORY — PX: TISSUE GRAFT: SHX251

## 2015-02-07 NOTE — Assessment & Plan Note (Signed)
Will refer to sports med

## 2015-02-07 NOTE — Assessment & Plan Note (Signed)
She is following with oral surgeon for this.

## 2015-02-07 NOTE — Assessment & Plan Note (Signed)
Fair control on wellbutrin. Has some situational stress.  Continue wellbutrin. She has not been tolerant of SSRI's in the past

## 2015-02-12 ENCOUNTER — Ambulatory Visit (INDEPENDENT_AMBULATORY_CARE_PROVIDER_SITE_OTHER): Payer: PRIVATE HEALTH INSURANCE | Admitting: Family Medicine

## 2015-02-12 ENCOUNTER — Encounter: Payer: Self-pay | Admitting: Family Medicine

## 2015-02-12 VITALS — BP 119/85 | HR 85 | Ht 67.0 in | Wt 169.0 lb

## 2015-02-12 DIAGNOSIS — M25552 Pain in left hip: Secondary | ICD-10-CM

## 2015-02-12 MED ORDER — METHYLPREDNISOLONE ACETATE 40 MG/ML IJ SUSP
40.0000 mg | Freq: Once | INTRAMUSCULAR | Status: AC
  Administered 2015-02-12: 40 mg via INTRA_ARTICULAR

## 2015-02-12 NOTE — Patient Instructions (Signed)
You have a combination of trochanteric bursitis/IT band syndrome, hip abduction weakness, SI joint dysfunction. You were given a cortisone injection today. Ibuprofen 600mg  three times a day with food OR aleve 2 tabs twice a day with food for pain and inflammation. Start home exercises - 3 sets of 10 once a day of standing hip rotations, side leg raises. Pick 2-3 of the stretches and hold for 20-30 seconds x 3 once or twice a day. Follow up with me in 6 weeks. Let me know if you want to do physical therapy before this though.

## 2015-02-15 NOTE — Assessment & Plan Note (Signed)
due to a combination of trochanteric bursitis, IT band syndrome, hip abduction weakness, SI joint dysfunction.  Start home exercises.  Bursa injection given as well.  Will consider physical therapy.  F/u in 6 weeks.  After informed written consent patient was lying on exam table on right side.  Area overlying left trochanteric bursa prepped with alcohol swab then bursa injected with 6:2 marcaine: depomedrol.  Patient tolerated procedure well without immediate complications.

## 2015-02-15 NOTE — Progress Notes (Signed)
PCP and referred by: Nance Pear., NP  Subjective:   HPI: Patient is a 49 y.o. female here for left hip pain.  Patient denies known injury or trauma. She states for about a year she's had pain in left hip that radiates into left buttocks, thigh, groin at times. Some tightness associated with this. Diagnosed about 2-3 years ago with an acute sacroiliitis that seemed to resolve. Has tried stretching, massage. Is on her feet a lot for work. + night pain. NSAIDs without much relief. No numbness/tingling. No back pain.  Past Medical History  Diagnosis Date  . Allergic rhinitis   . Depression (emotion)   . History of diverticulitis of colon   . GERD (gastroesophageal reflux disease)   . Osteoarthritis   . Migraine   . DJD (degenerative joint disease)     chronic neck pain    Current Outpatient Prescriptions on File Prior to Visit  Medication Sig Dispense Refill  . ALPRAZolam (XANAX) 0.25 MG tablet Take 1 tablet (0.25 mg total) by mouth 2 (two) times daily as needed for anxiety. 30 tablet 0  . calcium citrate-vitamin D (CITRACAL+D) 315-200 MG-UNIT per tablet Take 1 tablet by mouth 2 (two) times daily.      . eszopiclone (LUNESTA) 2 MG TABS tablet Take 1 tablet (2 mg total) by mouth at bedtime as needed. Take immediately before bedtime 30 tablet 0  . ETODOLAC PO Take 1 tablet by mouth 2 (two) times daily as needed.    . fluticasone (FLONASE) 50 MCG/ACT nasal spray Place 2 sprays into both nostrils daily. 16 g 1  . gabapentin (NEURONTIN) 300 MG capsule Take 1 capsule (300 mg total) by mouth 2 (two) times daily. 4 capsule 0  . loratadine (CLARITIN) 10 MG tablet Take 10 mg by mouth daily.      . Multiple Vitamins-Minerals (CENTRUM PO) Take by mouth daily.      . SUMAtriptan (IMITREX) 50 MG tablet Take 100 mg by mouth every 2 (two) hours as needed.     Marland Kitchen tiZANidine (ZANAFLEX) 4 MG tablet Take 4 mg by mouth at bedtime as needed for muscle spasms.    . WELLBUTRIN XL 150 MG 24 hr  tablet Take 2 tablets (300 mg total) by mouth daily. 180 tablet 1   No current facility-administered medications on file prior to visit.    Past Surgical History  Procedure Laterality Date  . Appendectomy    . Tmj arthroscopy      TMJ surgery  . Ovarian cyst surgery      abdominal surgery for ovarian cyst  . Cervical discectomy      Dr. Harl Bowie 06/2011[facility]    Allergies  Allergen Reactions  . Penicillins Hives    Social History   Social History  . Marital Status: Single    Spouse Name: N/A  . Number of Children: N/A  . Years of Education: N/A   Occupational History  . Not on file.   Social History Main Topics  . Smoking status: Never Smoker   . Smokeless tobacco: Never Used  . Alcohol Use: 0.0 oz/week    0 Standard drinks or equivalent per week     Comment: 2 drinks weekly  . Drug Use: No  . Sexual Activity: Not on file   Other Topics Concern  . 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 Arthritis    . Breast cancer    . Coronary artery disease Maternal Grandmother   . Hypertension      BP 119/85 mmHg  Pulse 85  Ht 5\' 7"  (1.702 m)  Wt 169 lb (76.658 kg)  BMI 26.46 kg/m2  LMP 01/29/2015  Review of Systems: See HPI above.    Objective:  Physical Exam:  Gen: NAD  Back/left hip: No gross deformity, scoliosis. TTP proximal IT band, trochanteric bursa, less at SI joint.  No midline or bony TTP. FROM back without pain.  No groin pain with IR/ER. Strength LEs 5/5 all muscle groups except 4/5 with hip abduction.   2+ MSRs in patellar and achilles tendons, equal bilaterally. Negative SLRs. Sensation intact to light touch bilaterally. Negative logroll bilateral  hips Negative fabers and piriformis stretches.    Assessment & Plan:  1. Left hip pain - due to a combination of trochanteric bursitis, IT band syndrome, hip abduction weakness, SI joint dysfunction.  Start home exercises.  Bursa injection given as well.  Will consider physical therapy.  F/u in 6 weeks.  After informed written consent patient was lying on exam table on right side.  Area overlying left trochanteric bursa prepped with alcohol swab then bursa injected with 6:2 marcaine: depomedrol.  Patient tolerated procedure well without immediate complications.

## 2015-03-07 ENCOUNTER — Telehealth: Payer: Self-pay | Admitting: Family

## 2015-03-07 NOTE — Telephone Encounter (Signed)
Pt left VM 03/07/15 10:36am to schedule cpe appt, left msg for pt to return call

## 2015-03-26 ENCOUNTER — Encounter: Payer: Self-pay | Admitting: Family Medicine

## 2015-03-26 ENCOUNTER — Ambulatory Visit (INDEPENDENT_AMBULATORY_CARE_PROVIDER_SITE_OTHER): Payer: PRIVATE HEALTH INSURANCE | Admitting: Family Medicine

## 2015-03-26 VITALS — BP 113/69 | HR 106 | Ht 67.0 in | Wt 164.0 lb

## 2015-03-26 DIAGNOSIS — M25552 Pain in left hip: Secondary | ICD-10-CM

## 2015-03-27 NOTE — Assessment & Plan Note (Signed)
due to a combination of trochanteric bursitis, IT band syndrome, hip abduction weakness, SI joint dysfunction.  Much improved.  Encouraged to continue home exercises another 4-6 weeks.  F/u prn.

## 2015-03-27 NOTE — Progress Notes (Signed)
PCP and referred by: Nance Pear., NP  Subjective:   HPI: Patient is a 49 y.o. female here for left hip pain.  9/6: Patient denies known injury or trauma. She states for about a year she's had pain in left hip that radiates into left buttocks, thigh, groin at times. Some tightness associated with this. Diagnosed about 2-3 years ago with an acute sacroiliitis that seemed to resolve. Has tried stretching, massage. Is on her feet a lot for work. + night pain. NSAIDs without much relief. No numbness/tingling. No back pain.  10/18: Patient reports she is doing much better. Pain level 0/10 currently - maybe to 1/10 if she does a lot of walking posterolateral hip. Pain is only a soreness when it comes on then. Did well following injection. Doing home exercise program. No swelling, bruising. No skin changes, fever, other complaints.  Past Medical History  Diagnosis Date  . Allergic rhinitis   . Depression (emotion)   . History of diverticulitis of colon   . GERD (gastroesophageal reflux disease)   . Osteoarthritis   . Migraine   . DJD (degenerative joint disease)     chronic neck pain    Current Outpatient Prescriptions on File Prior to Visit  Medication Sig Dispense Refill  . ALPRAZolam (XANAX) 0.25 MG tablet Take 1 tablet (0.25 mg total) by mouth 2 (two) times daily as needed for anxiety. 30 tablet 0  . calcium citrate-vitamin D (CITRACAL+D) 315-200 MG-UNIT per tablet Take 1 tablet by mouth 2 (two) times daily.      . eszopiclone (LUNESTA) 2 MG TABS tablet Take 1 tablet (2 mg total) by mouth at bedtime as needed. Take immediately before bedtime 30 tablet 0  . ETODOLAC PO Take 1 tablet by mouth 2 (two) times daily as needed.    . fluticasone (FLONASE) 50 MCG/ACT nasal spray Place 2 sprays into both nostrils daily. 16 g 1  . gabapentin (NEURONTIN) 300 MG capsule Take 1 capsule (300 mg total) by mouth 2 (two) times daily. 4 capsule 0  . loratadine (CLARITIN) 10 MG  tablet Take 10 mg by mouth daily.      . Multiple Vitamins-Minerals (CENTRUM PO) Take by mouth daily.      . SUMAtriptan (IMITREX) 50 MG tablet Take 100 mg by mouth every 2 (two) hours as needed.     Marland Kitchen tiZANidine (ZANAFLEX) 4 MG tablet Take 4 mg by mouth at bedtime as needed for muscle spasms.    . WELLBUTRIN XL 150 MG 24 hr tablet Take 2 tablets (300 mg total) by mouth daily. 180 tablet 1   No current facility-administered medications on file prior to visit.    Past Surgical History  Procedure Laterality Date  . Appendectomy    . Tmj arthroscopy      TMJ surgery  . Ovarian cyst surgery      abdominal surgery for ovarian cyst  . Cervical discectomy      Dr. Harl Bowie 06/2011[facility]    Allergies  Allergen Reactions  . Penicillins Hives    Social History   Social History  . Marital Status: Single    Spouse Name: N/A  . Number of Children: N/A  . Years of Education: N/A   Occupational History  . Not on file.   Social History Main Topics  . Smoking status: Never Smoker   . Smokeless tobacco: Never Used  . Alcohol Use: 0.0 oz/week    0 Standard drinks or equivalent per week  Comment: 2 drinks weekly  . Drug Use: No  . Sexual Activity: Not on file   Other Topics Concern  . 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 Arthritis    . Breast cancer    . Coronary artery disease Maternal Grandmother   . Hypertension      BP 113/69 mmHg  Pulse 106  Ht 5\' 7"  (1.702 m)  Wt 164 lb (74.39 kg)  BMI 25.68 kg/m2  Review of Systems: See HPI above.    Objective:  Physical Exam:  Gen: NAD  Back: No gross deformity, scoliosis. Minimal tenderness trochanteric area on left.  No TTP  proximal IT band, SI joint.  No midline or bony TTP. FROM back without pain.  Strength LEs 5/5 all muscle groups except 5-/5 with hip abduction.   Negative SLRs. Sensation intact to light touch bilaterally.  Left hip: Negative logroll bilateral hips Negative fabers and piriformis stretches.  Right hip: FROM without pain.    Assessment & Plan:  1. Left hip pain - due to a combination of trochanteric bursitis, IT band syndrome, hip abduction weakness, SI joint dysfunction.  Much improved.  Encouraged to continue home exercises another 4-6 weeks.  F/u prn.

## 2015-04-15 ENCOUNTER — Ambulatory Visit (INDEPENDENT_AMBULATORY_CARE_PROVIDER_SITE_OTHER): Payer: PRIVATE HEALTH INSURANCE | Admitting: Family

## 2015-04-15 ENCOUNTER — Encounter: Payer: Self-pay | Admitting: Family

## 2015-04-15 VITALS — BP 104/75 | HR 73 | Temp 98.3°F | Resp 16 | Ht 66.5 in | Wt 168.3 lb

## 2015-04-15 DIAGNOSIS — Z0000 Encounter for general adult medical examination without abnormal findings: Secondary | ICD-10-CM | POA: Diagnosis not present

## 2015-04-15 LAB — URINALYSIS, ROUTINE W REFLEX MICROSCOPIC
Hgb urine dipstick: NEGATIVE
KETONES UR: NEGATIVE
Leukocytes, UA: NEGATIVE
NITRITE: NEGATIVE
PH: 6 (ref 5.0–8.0)
RBC / HPF: NONE SEEN (ref 0–?)
Specific Gravity, Urine: 1.01 (ref 1.000–1.030)
TOTAL PROTEIN, URINE-UPE24: NEGATIVE
Urine Glucose: NEGATIVE
Urobilinogen, UA: 0.2 (ref 0.0–1.0)
WBC, UA: NONE SEEN (ref 0–?)

## 2015-04-15 LAB — LIPID PANEL
Cholesterol: 189 mg/dL (ref 0–200)
HDL: 58.9 mg/dL (ref >39.00)
LDL Cholesterol: 116 mg/dL — ABNORMAL HIGH (ref 0–99)
NONHDL: 130.42
TRIGLYCERIDES: 71 mg/dL (ref 0.0–149.0)
Total CHOL/HDL Ratio: 3
VLDL: 14.2 mg/dL (ref 0.0–40.0)

## 2015-04-15 LAB — CBC WITH DIFFERENTIAL/PLATELET
BASOS ABS: 0 10*3/uL (ref 0.0–0.1)
Basophils Relative: 0.3 % (ref 0.0–3.0)
Eosinophils Absolute: 0.1 10*3/uL (ref 0.0–0.7)
Eosinophils Relative: 0.9 % (ref 0.0–5.0)
HCT: 40.7 % (ref 36.0–46.0)
Hemoglobin: 12.9 g/dL (ref 12.0–15.0)
LYMPHS ABS: 1.5 10*3/uL (ref 0.7–4.0)
Lymphocytes Relative: 24.8 % (ref 12.0–46.0)
MCHC: 31.7 g/dL (ref 30.0–36.0)
MCV: 88.7 fl (ref 78.0–100.0)
MONO ABS: 0.5 10*3/uL (ref 0.1–1.0)
Monocytes Relative: 8.7 % (ref 3.0–12.0)
NEUTROS PCT: 65.3 % (ref 43.0–77.0)
Neutro Abs: 3.9 10*3/uL (ref 1.4–7.7)
PLATELETS: 214 10*3/uL (ref 150.0–400.0)
RBC: 4.59 Mil/uL (ref 3.87–5.11)
RDW: 13.7 % (ref 11.5–15.5)
WBC: 6 10*3/uL (ref 4.0–10.5)

## 2015-04-15 LAB — HEPATIC FUNCTION PANEL
ALT: 16 U/L (ref 0–35)
AST: 23 U/L (ref 0–37)
Albumin: 3.8 g/dL (ref 3.5–5.2)
Alkaline Phosphatase: 76 U/L (ref 39–117)
BILIRUBIN TOTAL: 0.5 mg/dL (ref 0.2–1.2)
Bilirubin, Direct: 0.1 mg/dL (ref 0.0–0.3)
Total Protein: 6.6 g/dL (ref 6.0–8.3)

## 2015-04-15 LAB — BASIC METABOLIC PANEL
BUN: 11 mg/dL (ref 6–23)
CALCIUM: 9.3 mg/dL (ref 8.4–10.5)
CO2: 29 mEq/L (ref 19–32)
Chloride: 106 mEq/L (ref 96–112)
Creatinine, Ser: 0.86 mg/dL (ref 0.40–1.20)
GFR: 74.23 mL/min (ref >60.00)
GLUCOSE: 77 mg/dL (ref 70–99)
Potassium: 3.9 mEq/L (ref 3.5–5.1)
Sodium: 143 mEq/L (ref 135–145)

## 2015-04-15 LAB — TSH: TSH: 2.08 u[IU]/mL (ref 0.35–4.50)

## 2015-04-15 NOTE — Assessment & Plan Note (Signed)
Discussed exercise, obtain routine lab work.  EKG today, notes some heat intolerance (check TSH) when exercising outside during the summer.

## 2015-04-15 NOTE — Patient Instructions (Signed)
Please complete lab work prior to leaving.   

## 2015-04-15 NOTE — Progress Notes (Signed)
Pre visit review using our clinic review tool, if applicable. No additional management support is needed unless otherwise documented below in the visit note. 

## 2015-04-15 NOTE — Progress Notes (Signed)
Subjective:    Patient ID: Pamela Sanders, female    DOB: Apr 22, 1966, 49 y.o.   MRN: [id number]  HPI  Pamela Sanders is a 49 yr old female who presents today for complete physical.  Immunizations:  Td up to date. Had flu shot at work.  Diet: reports healthy diet.  Exercise: some, needs more exercise Colonoscopy: 2015 Dexa: due Pap Smear:10/16 Mammogram: 10/16  Wt Readings from Last 3 Encounters:  04/15/15 168 lb 5.1 oz (76.349 kg)  03/26/15 164 lb (74.39 kg)  02/12/15 169 lb (76.658 kg)     Review of Systems  Constitutional: Negative for unexpected weight change.  Respiratory: Negative for cough and shortness of breath.   Cardiovascular: Negative for chest pain.  Gastrointestinal: Positive for diarrhea and constipation.  Genitourinary: Negative for dysuria, frequency and menstrual problem.  Musculoskeletal: Negative for myalgias.       Some neck pain- improves with massages Saw sports met for left hip pain, improved with cortisone shot  Skin: Negative for rash.  Neurological:       Migraines are well controlled.   Hematological: Negative for adenopathy.  Psychiatric/Behavioral:       Rare use of xanax.  Helped (only took at night)   Past Medical History  Diagnosis Date  . Allergic rhinitis   . Depression (emotion)   . History of diverticulitis of colon   . GERD (gastroesophageal reflux disease)   . Osteoarthritis   . Migraine   . DJD (degenerative joint disease)     chronic neck pain    Social History   Social History  . Marital Status: Single    Spouse Name: N/A  . Number of Children: N/A  . Years of Education: N/A   Occupational History  . Not on file.   Social History Main Topics  . Smoking status: Never Smoker   . Smokeless tobacco: Never Used  . Alcohol Use: 0.0 oz/week    0 Standard drinks or equivalent per week     Comment: 2 drinks weekly  . Drug Use: No  . Sexual Activity: Not on file   Other Topics Concern  . Not on file   Social  History Narrative   Domestic Partner   no children   She grew up in [place], [place]    Family moved to [place] when she was in [employer]   Occupation:lab research / Personal assistant     Regular Exercise:  3 x weekly   Caffeine Use:  2 cups coffee daily, 1 tea    Past Surgical History  Procedure Laterality Date  . Appendectomy    . Tmj arthroscopy      TMJ surgery  . Ovarian cyst surgery      abdominal surgery for ovarian cyst  . Cervical discectomy      Pamela Sanders 06/2011[facility]  . Tissue graft  02/07/15    pt reports gum graft for receeding gums--Pamela Sanders    Family History  Problem Relation Age of Onset  . Arthritis    . Breast cancer    . Coronary artery disease Maternal Grandmother   . Hypertension      Allergies  Allergen Reactions  . Penicillins Hives    Current Outpatient Prescriptions on File Prior to Visit  Medication Sig Dispense Refill  . ALPRAZolam (XANAX) 0.25 MG tablet Take 1 tablet (0.25 mg total) by mouth 2 (two) times daily as needed for anxiety. 30 tablet 0  . calcium citrate-vitamin D (CITRACAL+D) 315-200  MG-UNIT per tablet Take 1 tablet by mouth 2 (two) times daily.      . eszopiclone (LUNESTA) 2 MG TABS tablet Take 1 tablet (2 mg total) by mouth at bedtime as needed. Take immediately before bedtime 30 tablet 0  . ETODOLAC PO Take 400 mg by mouth 2 (two) times daily as needed.     . gabapentin (NEURONTIN) 300 MG capsule Take 1 capsule (300 mg total) by mouth 2 (two) times daily. 4 capsule 0  . loratadine (CLARITIN) 10 MG tablet Take 10 mg by mouth daily.      . Multiple Vitamins-Minerals (CENTRUM PO) Take by mouth daily.      . SUMAtriptan (IMITREX) 50 MG tablet Take 100 mg by mouth every 2 (two) hours as needed.     Marland Kitchen tiZANidine (ZANAFLEX) 4 MG tablet Take 4 mg by mouth at bedtime as needed for muscle spasms.    . WELLBUTRIN XL 150 MG 24 hr tablet Take 2 tablets (300 mg total) by mouth daily. 180 tablet 1   No current facility-administered medications on file  prior to visit.    BP 104/75 mmHg  Pulse 73  Temp(Src) 98.3 F (36.8 C) (Oral)  Resp 16  Ht 5' 6.5" (1.689 m)  Wt 168 lb 5.1 oz (76.349 kg)  BMI 26.76 kg/m2  SpO2 99%  LMP 03/25/2015        Objective:   Physical Exam  Physical Exam  Constitutional: She is oriented to person, place, and time. She appears well-developed and well-nourished. No distress.  HENT:  Head: Normocephalic and atraumatic.  Right Ear: Tympanic membrane and ear canal normal.  Left Ear: Tympanic membrane and ear canal normal.  Mouth/Throat: Oropharynx is clear and moist.  Eyes: Pupils are equal, round, and reactive to light. No scleral icterus.  Neck: Normal range of motion. No thyromegaly present.  Cardiovascular: Normal rate and regular rhythm.   No murmur heard. Pulmonary/Chest: Effort normal and breath sounds normal. No respiratory distress. He has no wheezes. She has no rales. She exhibits no tenderness.  Abdominal: Soft. Bowel sounds are normal. He exhibits no distension and no mass. There is no tenderness. There is no rebound and no guarding.  Musculoskeletal: She exhibits no edema.  Lymphadenopathy:    She has no cervical adenopathy.  Neurological: She is alert and oriented to person, place, and time. She has normal patellar reflexes. She exhibits normal muscle tone. Coordination normal.  Skin: Skin is warm and dry.  Psychiatric: She has a normal Sanders and affect. Her behavior is normal. Judgment and thought content normal.  Breast/pelvis: deferred to gyn      Assessment & Plan:        Assessment & Plan:  EKG tracing is personally reviewed.  EKG notes NSR.  No acute changes.

## 2015-04-16 LAB — HIV ANTIBODY (ROUTINE TESTING W REFLEX): HIV: NONREACTIVE

## 2015-04-17 ENCOUNTER — Encounter: Payer: PRIVATE HEALTH INSURANCE | Admitting: Family

## 2015-04-17 ENCOUNTER — Encounter: Payer: Self-pay | Admitting: Family

## 2015-07-08 ENCOUNTER — Other Ambulatory Visit: Payer: Self-pay | Admitting: Family

## 2015-09-18 ENCOUNTER — Other Ambulatory Visit: Payer: Self-pay | Admitting: Family

## 2015-09-18 NOTE — Telephone Encounter (Signed)
Alprazolam and lunesta last filled 02/05/15, #30 each. Pt due for follow up 10/2015 and UDS with PCP. Rxs printed and forwarded to covering Provider, Larose Kells for signature.

## 2015-09-18 NOTE — Telephone Encounter (Signed)
Rx faxed to pharmacy. Please call pt to arrange follow up with [facility] in May.  Thanks!

## 2015-09-25 NOTE — Telephone Encounter (Signed)
lvm for pt advised of the below.

## 2015-10-16 ENCOUNTER — Encounter: Payer: Self-pay | Admitting: Family

## 2015-10-16 ENCOUNTER — Ambulatory Visit (INDEPENDENT_AMBULATORY_CARE_PROVIDER_SITE_OTHER): Payer: PRIVATE HEALTH INSURANCE | Admitting: Family

## 2015-10-16 VITALS — BP 106/79 | HR 85 | Temp 98.1°F | Resp 16 | Ht 67.0 in | Wt 167.2 lb

## 2015-10-16 DIAGNOSIS — F329 Major depressive disorder, single episode, unspecified: Secondary | ICD-10-CM

## 2015-10-16 DIAGNOSIS — G43009 Migraine without aura, not intractable, without status migrainosus: Secondary | ICD-10-CM

## 2015-10-16 DIAGNOSIS — F32A Depression, unspecified: Secondary | ICD-10-CM

## 2015-10-16 DIAGNOSIS — G4700 Insomnia, unspecified: Secondary | ICD-10-CM

## 2015-10-16 MED ORDER — ALPRAZOLAM 0.25 MG PO TABS: 0.2500 mg | ORAL_TABLET | Freq: Two times a day (BID) | ORAL | Status: DC | PRN

## 2015-10-16 MED ORDER — ESZOPICLONE 2 MG PO TABS: ORAL_TABLET | ORAL | Status: DC

## 2015-10-16 NOTE — Progress Notes (Signed)
Subjective:    Patient ID: Pamela Sanders, female    DOB: 10-Jul-1965, 50 y.o.   MRN: [id number]  HPI  Pamela Sanders is a 50 yr old female who presents today for follow up of her depression. She is currently maintained on wellbutrin.  She uses xanax prn anxiety. Last dose of xanax was  1 week ago. Her work situation has improved and she is much less stressed. Reports that her mood is good.    Insomnia- reports rarely needing Lunesta.   Migraines reports that migraines have been well controlled.  She is followed by neurology at [facility].   sesamoiditis right foot,  Seeing foot doctor.  On a pred 10 day taper. On her feet at work.   Review of Systems    see HPI  Past Medical History  Diagnosis Date  . Allergic rhinitis   . Depression (emotion)   . History of diverticulitis of colon   . GERD (gastroesophageal reflux disease)   . Osteoarthritis   . Migraine   . DJD (degenerative joint disease)     chronic neck pain     Social History   Social History  . Marital Status: Single    Spouse Name: N/A  . Number of Children: N/A  . Years of Education: N/A   Occupational History  . Not on file.   Social History Main Topics  . Smoking status: Never Smoker   . Smokeless tobacco: Never Used  . Alcohol Use: 0.0 oz/week    0 Standard drinks or equivalent per week     Comment: 2 drinks weekly  . Drug Use: No  . Sexual Activity: Not on file   Other Topics Concern  . 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 Appendectomy    . Tmj arthroscopy      TMJ surgery  . Ovarian cyst surgery      abdominal surgery for ovarian cyst  . Cervical discectomy      Dr. Harl Bowie 06/2011[facility]  . Tissue graft   02/07/15    pt reports gum graft for receeding gums--Dr Geralynn Ochs    Family History  Problem Relation Age of Onset  . Arthritis    . Breast cancer    . Coronary artery disease Maternal Grandmother   . Hypertension      Allergies  Allergen Reactions  . Penicillins Hives    Current Outpatient Prescriptions on File Prior to Visit  Medication Sig Dispense Refill  . calcium citrate-vitamin D (CITRACAL+D) 315-200 MG-UNIT per tablet Take 1 tablet by mouth 2 (two) times daily.      . ETODOLAC PO Take 400 mg by mouth 2 (two) times daily as needed.     . gabapentin (NEURONTIN) 300 MG capsule Take 1 capsule (300 mg total) by mouth 2 (two) times daily. 4 capsule 0  . loratadine (CLARITIN) 10 MG tablet Take 10 mg by mouth daily.      . Multiple Vitamins-Minerals (CENTRUM PO) Take by mouth daily.      . SUMAtriptan (IMITREX) 50 MG tablet Take 100 mg by mouth every 2 (  two) hours as needed.     Marland Kitchen tiZANidine (ZANAFLEX) 4 MG tablet Take 4 mg by mouth at bedtime as needed for muscle spasms.    . WELLBUTRIN XL 150 MG 24 hr tablet TAKE 2 TABLETS BY MOUTH DAILY. 180 tablet 1   No current facility-administered medications on file prior to visit.    BP 106/79 mmHg  Pulse 85  Temp(Src) 98.1 F (36.7 C) (Oral)  Resp 16  Ht 5\' 7"  (1.702 m)  Wt 167 lb 3.2 oz (75.841 kg)  BMI 26.18 kg/m2  SpO2 99%  LMP 09/16/2015    Objective:   Physical Exam  Constitutional: She is oriented to person, place, and time. She appears well-developed and well-nourished.  HENT:  Head: Normocephalic and atraumatic.  Cardiovascular: Normal rate, regular rhythm and normal heart sounds.   No murmur heard. Pulmonary/Chest: Effort normal and breath sounds normal. No respiratory distress. She has no wheezes.  Musculoskeletal: She exhibits no edema.  Lymphadenopathy:    She has no cervical adenopathy.  Neurological: She is alert and oriented to person, place, and time.  Psychiatric: She has a normal mood and affect. Her  behavior is normal. Judgment and thought content normal.          Assessment & Plan:

## 2015-10-16 NOTE — Assessment & Plan Note (Signed)
Stable on wellbutrin, continue same.

## 2015-10-16 NOTE — Assessment & Plan Note (Signed)
Improved, management per Neurology.

## 2015-10-16 NOTE — Assessment & Plan Note (Signed)
Stable with occasional use of lunesta.

## 2016-01-07 ENCOUNTER — Other Ambulatory Visit: Payer: Self-pay | Admitting: Internal Medicine

## 2016-01-07 ENCOUNTER — Other Ambulatory Visit: Payer: Self-pay | Admitting: Family

## 2016-01-07 MED ORDER — ESZOPICLONE 2 MG PO TABS: ORAL_TABLET | ORAL | 0 refills | Status: DC

## 2016-01-07 NOTE — Telephone Encounter (Signed)
Last Lunesta Rx: 10/16/15, #30 Last OV: 10/16/15 Next OV: due 04/17/16 UDS: needs to be collected at next OV.  Rx printed and forwarded to PCP for signature.

## 2016-01-08 NOTE — Telephone Encounter (Signed)
Rx faxed to pharmacy on 01/08/16.

## 2016-06-29 ENCOUNTER — Other Ambulatory Visit: Payer: Self-pay | Admitting: Family

## 2016-06-30 NOTE — Telephone Encounter (Signed)
eScribe request from [facility] for refill on Wellbutrin 150mg  Last filled - 01/07/16, #180x1 Last AEX - 10/16/15 Next AEX - Patient was to return in 6-Mths  [04/17/16] for CPE, no future appt scheduled. Please Advise on refills/SLS 01/23

## 2016-07-27 ENCOUNTER — Telehealth: Payer: Self-pay | Admitting: *Deleted

## 2016-07-27 NOTE — Telephone Encounter (Signed)
Pt has not read mychart message from 06/29/16 re: need to be seen for further refills. Mailed letter to pt.

## 2016-09-23 ENCOUNTER — Encounter: Payer: Self-pay | Admitting: Family

## 2016-09-23 ENCOUNTER — Ambulatory Visit (INDEPENDENT_AMBULATORY_CARE_PROVIDER_SITE_OTHER): Payer: PRIVATE HEALTH INSURANCE | Admitting: Family

## 2016-09-23 DIAGNOSIS — G43009 Migraine without aura, not intractable, without status migrainosus: Secondary | ICD-10-CM | POA: Diagnosis not present

## 2016-09-23 DIAGNOSIS — I10 Essential (primary) hypertension: Secondary | ICD-10-CM | POA: Diagnosis not present

## 2016-09-23 DIAGNOSIS — F32A Depression, unspecified: Secondary | ICD-10-CM

## 2016-09-23 DIAGNOSIS — R6889 Other general symptoms and signs: Secondary | ICD-10-CM | POA: Diagnosis not present

## 2016-09-23 DIAGNOSIS — F329 Major depressive disorder, single episode, unspecified: Secondary | ICD-10-CM | POA: Diagnosis not present

## 2016-09-23 DIAGNOSIS — S6990XD Unspecified injury of unspecified wrist, hand and finger(s), subsequent encounter: Secondary | ICD-10-CM

## 2016-09-23 DIAGNOSIS — G4700 Insomnia, unspecified: Secondary | ICD-10-CM

## 2016-09-23 LAB — TSH: TSH: 2.08 u[IU]/mL (ref 0.35–4.50)

## 2016-09-23 MED ORDER — ESZOPICLONE 2 MG PO TABS: ORAL_TABLET | ORAL | 0 refills | Status: DC

## 2016-09-23 MED ORDER — WELLBUTRIN XL 150 MG PO TB24: 150.0000 mg | ORAL_TABLET | Freq: Every day | ORAL | 5 refills | Status: DC

## 2016-09-23 NOTE — Assessment & Plan Note (Signed)
Stable, continue brand name wellbutrin.

## 2016-09-23 NOTE — Patient Instructions (Signed)
Please complete lab work prior to leaving. Schedule a complete physical at the front desk.  

## 2016-09-23 NOTE — Assessment & Plan Note (Signed)
Stable with rare use of lunesta.

## 2016-09-23 NOTE — Progress Notes (Signed)
Subjective:    Patient ID: Pamela Sanders, female    DOB: 1965/09/26, 51 y.o.   MRN: [id number]  HPI  Pamela Sanders is a 51 yr old female who presents today for follow up.  1) Depression- Mood is better since she has a new position at work. Continues wellbutrin.   2) Migraines/Neck pain- reports that she received a trigger point injeciton for muscle spsms. Has a follow up in may.   3) Hand injury- had a crush injury 3 weeks ago. Reports that she had stitches. Denies numbness.   4) insomnia- very rarely needing to use lunesta  Reports + heat intolerance  Review of Systems See HPI  Past Medical History:  Diagnosis Date  . Allergic rhinitis   . Depression (emotion)   . DJD (degenerative joint disease)    chronic neck pain  . GERD (gastroesophageal reflux disease)   . History of diverticulitis of colon   . Migraine   . Osteoarthritis      Social History   Social History  . Marital status: Single    Spouse name: N/A  . Number of children: N/A  . Years of education: N/A   Occupational History  . Not on file.   Social History Main Topics  . Smoking status: Never Smoker  . Smokeless tobacco: Never Used  . Alcohol use 0.0 oz/week     Comment: 2 drinks weekly  . Drug use: No  . Sexual activity: Not on file   Other Topics Concern  . 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 APPENDECTOMY    . CERVICAL DISCECTOMY     Dr. Harl Bowie 06/2011[facility]  . OVARIAN CYST SURGERY     abdominal surgery for ovarian cyst  . TISSUE GRAFT  02/07/15   pt reports gum graft for receeding gums--Dr Geralynn Ochs  . TMJ ARTHROSCOPY     TMJ surgery    Family History  Problem Relation Age of Onset  . Arthritis     . Breast cancer    . Coronary artery disease Maternal Grandmother   . Hypertension      Allergies  Allergen Reactions  . Penicillins Hives    Current Outpatient Prescriptions on File Prior to Visit  Medication Sig Dispense Refill  . ALPRAZolam (XANAX) 0.25 MG tablet Take 1 tablet (0.25 mg total) by mouth 2 (two) times daily as needed. for anxiety 30 tablet 0  . calcium citrate-vitamin D (CITRACAL+D) 315-200 MG-UNIT per tablet Take 1 tablet by mouth 2 (two) times daily.      . eszopiclone (LUNESTA) 2 MG TABS tablet TAKE ONE TABLET BY MOUTH AT BEDTIME AS NEEDED (TAKE IMMEDIATELY BEFORE BEDTIME) 30 tablet 0  . ETODOLAC PO Take 400 mg by mouth 2 (two) times daily as needed.     . gabapentin (NEURONTIN) 300 MG capsule Take 1 capsule (300 mg total) by mouth 2 (two) times daily. 4 capsule 0  . loratadine (CLARITIN) 10 MG tablet Take 10 mg by mouth daily.      . Multiple Vitamins-Minerals (CENTRUM PO) Take by mouth daily.      Marland Kitchen  SUMAtriptan (IMITREX) 50 MG tablet Take 100 mg by mouth every 2 (two) hours as needed.      No current facility-administered medications on file prior to visit.     BP 105/73 (BP Location: Left Arm, Cuff Size: Normal)   Pulse 76   Temp 98.2 F (36.8 C) (Oral)   Resp 16   Ht 5\' 7"  (1.702 m)   Wt 172 lb 9.6 oz (78.3 kg)   LMP 09/02/2016   SpO2 99% Comment: room air  BMI 27.03 kg/m       Objective:   Physical Exam  Constitutional: She appears well-developed and well-nourished.  Cardiovascular: Normal rate, regular rhythm and normal heart sounds.   No murmur heard. Pulmonary/Chest: Effort normal and breath sounds normal. No respiratory distress. She has no wheezes.  Skin:  Skin well healed on left 3rd and 4th fingers, mild residual swelling of fingers.   Psychiatric: She has a normal mood and affect. Her behavior is normal. Judgment and thought content normal.          Assessment & Plan:  Hand injury- appears to be healing well.   Heat  intolerance- will check TSH

## 2016-09-23 NOTE — Progress Notes (Signed)
Pre visit review using our clinic review tool, if applicable. No additional management support is needed unless otherwise documented below in the visit note. 

## 2016-09-23 NOTE — Assessment & Plan Note (Signed)
Stable, management per neurology. She is doing some massage therapy for neck pain.

## 2016-09-29 ENCOUNTER — Telehealth: Payer: Self-pay | Admitting: Family

## 2016-09-29 ENCOUNTER — Other Ambulatory Visit: Payer: Self-pay | Admitting: Family

## 2016-09-29 NOTE — Telephone Encounter (Signed)
Caller name: Gola Relationship to patient: self Can be reached: [phone] Pharmacy: [place], [place]  Reason for call: Pt called stating that Wellbutrin RX was sent incorrectly. Pt takes Wellbutrin XL 150mg  2 per day (brand name only) in 90 day supply = 180 doses. Pt requesting we resend RX so she can pick up tomorrow.

## 2016-09-29 NOTE — Telephone Encounter (Signed)
Melissa-- previous wellbutrin refills have been for 2 tablets daily.  Did directions change at last visit?

## 2016-09-30 MED ORDER — WELLBUTRIN XL 150 MG PO TB24: 300.0000 mg | ORAL_TABLET | Freq: Every day | ORAL | 1 refills | Status: DC

## 2016-11-10 ENCOUNTER — Encounter: Payer: Self-pay | Admitting: Family

## 2016-11-10 ENCOUNTER — Ambulatory Visit (INDEPENDENT_AMBULATORY_CARE_PROVIDER_SITE_OTHER): Payer: PRIVATE HEALTH INSURANCE | Admitting: Family

## 2016-11-10 VITALS — BP 112/78 | HR 67 | Temp 97.2°F | Resp 16 | Ht 67.5 in | Wt 172.6 lb

## 2016-11-10 DIAGNOSIS — G4700 Insomnia, unspecified: Secondary | ICD-10-CM | POA: Diagnosis not present

## 2016-11-10 DIAGNOSIS — Z0000 Encounter for general adult medical examination without abnormal findings: Secondary | ICD-10-CM | POA: Diagnosis not present

## 2016-11-10 LAB — LIPID PANEL
Cholesterol: 174 mg/dL (ref 0–200)
HDL: 55.1 mg/dL (ref >39.00)
LDL Cholesterol: 98 mg/dL (ref 0–99)
NonHDL: 118.6
TRIGLYCERIDES: 101 mg/dL (ref 0.0–149.0)
Total CHOL/HDL Ratio: 3
VLDL: 20.2 mg/dL (ref 0.0–40.0)

## 2016-11-10 LAB — CBC WITH DIFFERENTIAL/PLATELET
BASOS PCT: 0.7 % (ref 0.0–3.0)
Basophils Absolute: 0 10*3/uL (ref 0.0–0.1)
EOS ABS: 0.1 10*3/uL (ref 0.0–0.7)
Eosinophils Relative: 1.2 % (ref 0.0–5.0)
HEMATOCRIT: 40.5 % (ref 36.0–46.0)
Hemoglobin: 13.3 g/dL (ref 12.0–15.0)
LYMPHS PCT: 24.6 % (ref 12.0–46.0)
Lymphs Abs: 1.6 10*3/uL (ref 0.7–4.0)
MCHC: 32.9 g/dL (ref 30.0–36.0)
MCV: 87.8 fl (ref 78.0–100.0)
MONOS PCT: 9.5 % (ref 3.0–12.0)
Monocytes Absolute: 0.6 10*3/uL (ref 0.1–1.0)
NEUTROS ABS: 4.2 10*3/uL (ref 1.4–7.7)
Neutrophils Relative %: 64 % (ref 43.0–77.0)
PLATELETS: 212 10*3/uL (ref 150.0–400.0)
RBC: 4.62 Mil/uL (ref 3.87–5.11)
RDW: 12.6 % (ref 11.5–15.5)
WBC: 6.6 10*3/uL (ref 4.0–10.5)

## 2016-11-10 LAB — HEPATIC FUNCTION PANEL
ALBUMIN: 4.1 g/dL (ref 3.5–5.2)
ALT: 15 U/L (ref 0–35)
AST: 18 U/L (ref 0–37)
Alkaline Phosphatase: 66 U/L (ref 39–117)
Bilirubin, Direct: 0.1 mg/dL (ref 0.0–0.3)
TOTAL PROTEIN: 6.8 g/dL (ref 6.0–8.3)
Total Bilirubin: 0.3 mg/dL (ref 0.2–1.2)

## 2016-11-10 LAB — URINALYSIS, ROUTINE W REFLEX MICROSCOPIC
BILIRUBIN URINE: NEGATIVE
HGB URINE DIPSTICK: NEGATIVE
Ketones, ur: NEGATIVE
LEUKOCYTES UA: NEGATIVE
Nitrite: NEGATIVE
Specific Gravity, Urine: <=1.005 — AB (ref 1.000–1.030)
Total Protein, Urine: NEGATIVE
URINE GLUCOSE: NEGATIVE
UROBILINOGEN UA: 0.2 (ref 0.0–1.0)
pH: 6 (ref 5.0–8.0)

## 2016-11-10 LAB — BASIC METABOLIC PANEL
BUN: 12 mg/dL (ref 6–23)
CHLORIDE: 106 meq/L (ref 96–112)
CO2: 26 meq/L (ref 19–32)
CREATININE: 0.88 mg/dL (ref 0.40–1.20)
Calcium: 9.3 mg/dL (ref 8.4–10.5)
GFR: 71.83 mL/min (ref >60.00)
Glucose, Bld: 90 mg/dL (ref 70–99)
Potassium: 4.3 mEq/L (ref 3.5–5.1)
Sodium: 139 mEq/L (ref 135–145)

## 2016-11-10 LAB — TSH: TSH: 2.49 u[IU]/mL (ref 0.35–4.50)

## 2016-11-10 MED ORDER — ESZOPICLONE 2 MG PO TABS: ORAL_TABLET | ORAL | 0 refills | Status: DC

## 2016-11-10 NOTE — Progress Notes (Signed)
Subjective:    Patient ID: Pamela Sanders, female    DOB: 09/09/1965, 51 y.o.   MRN: [id number]  HPI  [name] is a 51 yr old female who presents today for complete physical.   Immunizations: tdap 04/05/07, reports that she had a booster from her employer 3 yrs ago.  Diet: healthy, does not overeat Exercise: started working out in the last 2 weeks.   Colonoscopy: 08/03/14- was told 10 year follow up Pap Smear: 04/15/16 Mammogram:  04/15/16 Vision:  Up to date Dental: up to date  Wt Readings from Last 3 Encounters:  11/10/16 172 lb 9.6 oz (78.3 kg)  09/23/16 172 lb 9.6 oz (78.3 kg)  10/16/15 167 lb 3.2 oz (75.8 kg)   Insomnia-  Wants to keep lunesta on hand to use on a prn basis.    Review of Systems  Constitutional: Negative for unexpected weight change.  HENT: Positive for tinnitus. Negative for hearing loss and rhinorrhea.   Eyes: Negative for visual disturbance.  Respiratory: Negative for cough.   Cardiovascular: Negative for leg swelling.  Gastrointestinal:       Alternates between constipation/diarrhea  Genitourinary: Negative for dysuria, frequency and menstrual problem.  Musculoskeletal: Negative for myalgias.       Hx of left elbow pain, has been bothering her some recently  Skin: Negative for rash.  Neurological:       Headaches are well controlled  Hematological: Negative for adenopathy.  Psychiatric/Behavioral:       Denies depression/anxiety   Past Medical History:  Diagnosis Date  . Allergic rhinitis   . Depression (emotion)   . DJD (degenerative joint disease)    chronic neck pain  . GERD (gastroesophageal reflux disease)   . History of diverticulitis of colon   . Migraine   . Osteoarthritis      Social History   Social History  . Marital status: Single    Spouse name: N/A  . Number of children: N/A  . Years of education: N/A   Occupational History  . Not on file.   Social History Main Topics  . Smoking status: Never Smoker  . Smokeless  tobacco: Never Used  . Alcohol use 0.0 oz/week     Comment: 2 drinks weekly  . Drug use: No  . Sexual activity: Not on file   Other Topics Concern  . 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 APPENDECTOMY    . CERVICAL DISCECTOMY     Dr. Harl Bowie 06/2011[facility]  . OVARIAN CYST SURGERY     abdominal surgery for ovarian cyst  . TISSUE GRAFT  02/07/15   pt reports gum graft for receeding gums--Dr Geralynn Ochs  . TMJ ARTHROSCOPY     TMJ surgery    Family History  Problem Relation Age of Onset  . Arthritis Unknown   . Breast cancer Unknown   . Coronary artery disease Maternal Grandmother   . Hypertension Unknown   . Diverticulitis Mother   . GER disease Mother   . Arthritis Mother     Allergies  Allergen Reactions  . Penicillins Hives  Current Outpatient Prescriptions on File Prior to Visit  Medication Sig Dispense Refill  . calcium citrate-vitamin D (CITRACAL+D) 315-200 MG-UNIT per tablet Take 1 tablet by mouth 2 (two) times daily.      . ETODOLAC PO Take 400 mg by mouth 2 (two) times daily as needed.     . gabapentin (NEURONTIN) 300 MG capsule Take 1 capsule (300 mg total) by mouth 2 (two) times daily. 4 capsule 0  . loratadine (CLARITIN) 10 MG tablet Take 10 mg by mouth daily.      . metaxalone (SKELAXIN) 800 MG tablet Take 800 mg by mouth 3 (three) times daily as needed. PRN     . Multiple Vitamins-Minerals (CENTRUM PO) Take by mouth daily.      . SUMAtriptan (IMITREX) 50 MG tablet Take 100 mg by mouth every 2 (two) hours as needed.     . WELLBUTRIN XL 150 MG 24 hr tablet Take 2 tablets (300 mg total) by mouth daily. 180 tablet 1   No current facility-administered medications on file prior  to visit.     BP 112/78 (BP Location: Left Arm, Cuff Size: Normal)   Pulse 67   Temp 97.2 F (36.2 C) (Oral)   Resp 16   Ht 5' 7.5" (1.715 m)   Wt 172 lb 9.6 oz (78.3 kg)   LMP 10/27/2016   SpO2 99%   BMI 26.63 kg/m        Objective:   Physical Exam Physical Exam  Constitutional: She is oriented to person, place, and time. She appears well-developed and well-nourished. No distress.  HENT:  Head: Normocephalic and atraumatic.  Right Ear: Tympanic membrane and ear canal normal.  Left Ear: Tympanic membrane and ear canal normal.  Mouth/Throat: Oropharynx is clear and moist.  Eyes: Pupils are equal, round, and reactive to light. No scleral icterus.  Neck: Normal range of motion. No thyromegaly present.  Cardiovascular: Normal rate and regular rhythm.   No murmur heard. Pulmonary/Chest: Effort normal and breath sounds normal. No respiratory distress. He has no wheezes. She has no rales. She exhibits no tenderness.  Abdominal: Soft. Bowel sounds are normal. She exhibits no distension and no mass. There is no tenderness. There is no rebound and no guarding.  Musculoskeletal: She exhibits no edema.  Lymphadenopathy:    She has no cervical adenopathy.  Neurological: She is alert and oriented to person, place, and time. She has normal patellar reflexes. She exhibits normal muscle tone. Coordination normal.  Skin: Skin is warm and dry.  Psychiatric: She has a normal mood and affect. Her behavior is normal. Judgment and thought content normal.  Breast/pelvic: deferred         Assessment & Plan:         Assessment & Plan:  Preventative care- Discussed healthy diet, exercise, weight loss. EKG tracing is personally reviewed.  EKG notes NSR.  No acute changes. Immunizations reviewed and up to date. Obtain routine lab work.   Insomnia- continues to need lunesta on a prn basis. Obtain UDS and an updated controlled substance contract is signed.

## 2016-11-10 NOTE — Patient Instructions (Addendum)
Please complete lab work prior to leaving.   

## 2016-11-12 ENCOUNTER — Encounter: Payer: Self-pay | Admitting: Family

## 2017-03-01 ENCOUNTER — Encounter: Payer: Self-pay | Admitting: Family

## 2017-03-05 ENCOUNTER — Ambulatory Visit (INDEPENDENT_AMBULATORY_CARE_PROVIDER_SITE_OTHER): Payer: PRIVATE HEALTH INSURANCE | Admitting: Family

## 2017-03-05 ENCOUNTER — Encounter: Payer: Self-pay | Admitting: Family

## 2017-03-05 VITALS — BP 116/80 | HR 77 | Temp 98.1°F | Resp 16 | Ht 67.5 in | Wt 172.8 lb

## 2017-03-05 DIAGNOSIS — R1013 Epigastric pain: Secondary | ICD-10-CM | POA: Diagnosis not present

## 2017-03-05 LAB — COMPREHENSIVE METABOLIC PANEL
ALK PHOS: 72 U/L (ref 39–117)
ALT: 18 U/L (ref 0–35)
AST: 19 U/L (ref 0–37)
Albumin: 4.2 g/dL (ref 3.5–5.2)
BILIRUBIN TOTAL: 0.3 mg/dL (ref 0.2–1.2)
BUN: 10 mg/dL (ref 6–23)
CO2: 27 meq/L (ref 19–32)
Calcium: 9.7 mg/dL (ref 8.4–10.5)
Chloride: 102 mEq/L (ref 96–112)
Creatinine, Ser: 0.86 mg/dL (ref 0.40–1.20)
GFR: 73.67 mL/min (ref >60.00)
GLUCOSE: 83 mg/dL (ref 70–99)
Potassium: 4.2 mEq/L (ref 3.5–5.1)
Sodium: 135 mEq/L (ref 135–145)
TOTAL PROTEIN: 7 g/dL (ref 6.0–8.3)

## 2017-03-05 LAB — LIPASE: Lipase: 28 U/L (ref 11.0–59.0)

## 2017-03-05 MED ORDER — RANITIDINE HCL 150 MG PO TABS: 150.0000 mg | ORAL_TABLET | Freq: Two times a day (BID) | ORAL | 2 refills | Status: DC

## 2017-03-05 NOTE — Patient Instructions (Signed)
Please complete lab work prior to leaving. Schedule ultrasound on the first floor in imaging. Begin zantac twice daily. We will contact you with your results.

## 2017-03-05 NOTE — Progress Notes (Signed)
Subjective:    Patient ID: Pamela Sanders, female    DOB: [birth year], 51 y.o.   MRN: [id number]  HPI  Reports that she tried otc prevacid the last week of august x 2 weeks. Seemed to help, but developed diarrhea. About 1 week later symptoms returned.  Recently she has some epigastric pain.  Post prandial bloating despite small meals. Stopped nsaids back in July.  Off/on diarrhea, several loose stools/day. No black/bloody stools.   Reports remote hx of endo with small hiatal hernia.   Review of Systems See HPI  Past Medical History:  Diagnosis Date  . Allergic rhinitis   . Depression (emotion)   . DJD (degenerative joint disease)    chronic neck pain  . GERD (gastroesophageal reflux disease)   . History of diverticulitis of colon   . Migraine   . Osteoarthritis      Social History   Social History  . Marital status: Single    Spouse name: N/A  . Number of children: N/A  . Years of education: N/A   Occupational History  . Not on file.   Social History Main Topics  . Smoking status: Never Smoker  . Smokeless tobacco: Never Used  . Alcohol use 0.0 oz/week     Comment: 2 drinks weekly  . Drug use: No  . Sexual activity: Not on file   Other Topics Concern  . 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 APPENDECTOMY    . CERVICAL DISCECTOMY     Dr. Harl Bowie 06/2011[facility] [place]  . OVARIAN CYST SURGERY     abdominal surgery for ovarian cyst  . TISSUE GRAFT  02/07/15   pt reports gum graft for receeding gums--Dr Geralynn Ochs  . TMJ ARTHROSCOPY     TMJ surgery    Family History  Problem Relation Age of Onset  . Arthritis Unknown   . Breast cancer Unknown   . Coronary artery disease Maternal  Grandmother   . Hypertension Unknown   . Diverticulitis Mother   . GER disease Mother   . Arthritis Mother     Allergies  Allergen Reactions  . Penicillins Hives    Current Outpatient Prescriptions on File Prior to Visit  Medication Sig Dispense Refill  . calcium citrate-vitamin D (CITRACAL+D) 315-200 MG-UNIT per tablet Take 1 tablet by mouth 2 (two) times daily.      . eszopiclone (LUNESTA) 2 MG TABS tablet TAKE ONE TABLET BY MOUTH AT BEDTIME AS NEEDED (TAKE IMMEDIATELY BEFORE BEDTIME) 30 tablet 0  . gabapentin (NEURONTIN) 300 MG capsule Take 1 capsule (300 mg total) by mouth 2 (two) times daily. 4 capsule 0  . loratadine (CLARITIN) 10 MG tablet Take 10 mg by mouth daily.      . metaxalone (SKELAXIN) 800 MG tablet Take 800 mg by mouth 3 (three) times daily as needed. PRN     . Multiple Vitamins-Minerals (CENTRUM PO) Take by mouth daily.      . SUMAtriptan (IMITREX) 50 MG tablet Take 100 mg by mouth every 2 (two) hours as needed.     . WELLBUTRIN XL 150  MG 24 hr tablet Take 2 tablets (300 mg total) by mouth daily. 180 tablet 1  . ETODOLAC PO Take 400 mg by mouth 2 (two) times daily as needed.      No current facility-administered medications on file prior to visit.     BP 116/80 (BP Location: Right Arm, Cuff Size: Normal)   Pulse 77   Temp 98.1 F (36.7 C) (Oral)   Resp 16   Ht 5' 7.5" (1.715 m)   Wt 172 lb 12.8 oz (78.4 kg)   LMP 02/05/2017   SpO2 99%   BMI 26.66 kg/m       Objective:   Physical Exam  Constitutional: She appears well-developed and well-nourished.  Cardiovascular: Normal rate, regular rhythm and normal heart sounds.   No murmur heard. Pulmonary/Chest: Effort normal and breath sounds normal. No respiratory distress. She has no wheezes.  Abdominal: Soft. Bowel sounds are normal. She exhibits no distension. There is no tenderness. There is no rebound.  Neurological: She is alert.  Psychiatric: She has a normal mood and affect. Her behavior is normal.  Judgment and thought content normal.          Assessment & Plan:  Abdominal pain- Differential includes cholecystitis, gastric ulcer IBS, he is switch  pancreatitis. Will obtain H. pylori breath test. Complete metabolic panel, lipase, as well as a abdominal ultrasound. She had diarrhea on proton pump inhibitor. Will give trial of Zantac twice a day. If workup unremarkable and symptoms do not improve plan referral to GI.

## 2017-03-07 ENCOUNTER — Ambulatory Visit (HOSPITAL_BASED_OUTPATIENT_CLINIC_OR_DEPARTMENT_OTHER)
Admission: RE | Admit: 2017-03-07 | Discharge: 2017-03-07 | Disposition: A | Discharge status: Home / Self Care | Payer: PRIVATE HEALTH INSURANCE | Source: Ambulatory Visit | Attending: Family | Admitting: Family

## 2017-03-07 DIAGNOSIS — R1013 Epigastric pain: Secondary | ICD-10-CM | POA: Diagnosis not present

## 2017-03-08 ENCOUNTER — Encounter: Payer: Self-pay | Admitting: Family

## 2017-03-08 LAB — H. PYLORI BREATH TEST: H. pylori Breath Test: NOT DETECTED

## 2017-04-06 ENCOUNTER — Other Ambulatory Visit: Payer: Self-pay | Admitting: Family

## 2017-04-20 ENCOUNTER — Encounter: Payer: Self-pay | Admitting: Family

## 2017-04-20 DIAGNOSIS — R1013 Epigastric pain: Secondary | ICD-10-CM

## 2017-04-26 ENCOUNTER — Encounter: Payer: Self-pay | Admitting: Gastroenterology

## 2017-05-03 ENCOUNTER — Ambulatory Visit: Payer: PRIVATE HEALTH INSURANCE | Admitting: Family

## 2017-05-03 ENCOUNTER — Encounter: Payer: Self-pay | Admitting: Gastroenterology

## 2017-05-07 ENCOUNTER — Other Ambulatory Visit: Payer: Self-pay | Admitting: Family

## 2017-05-10 NOTE — Telephone Encounter (Signed)
Medication no longer on current list. Mychart message sent to pt to verify request.  Last alprazolam RX: 10/16/15, #30 Last OV:  03/05/17 Next OV: none scheduled.  UDS: 11/10/16, moderate CSC: 11/10/16

## 2017-05-12 LAB — HM MAMMOGRAPHY

## 2017-05-12 NOTE — Telephone Encounter (Signed)
Pt responded that she only takes medication as needed. See below and advise. Should we have pt pick up Rx and provide UDS then?

## 2017-06-03 ENCOUNTER — Encounter: Payer: Self-pay | Admitting: Family

## 2017-06-18 ENCOUNTER — Other Ambulatory Visit (INDEPENDENT_AMBULATORY_CARE_PROVIDER_SITE_OTHER): Payer: PRIVATE HEALTH INSURANCE

## 2017-06-18 ENCOUNTER — Encounter: Payer: Self-pay | Admitting: Gastroenterology

## 2017-06-18 ENCOUNTER — Ambulatory Visit: Payer: PRIVATE HEALTH INSURANCE | Admitting: Gastroenterology

## 2017-06-18 VITALS — BP 98/62 | HR 90 | Ht 67.0 in | Wt 173.6 lb

## 2017-06-18 DIAGNOSIS — R198 Other specified symptoms and signs involving the digestive system and abdomen: Secondary | ICD-10-CM | POA: Diagnosis not present

## 2017-06-18 DIAGNOSIS — R1010 Upper abdominal pain, unspecified: Secondary | ICD-10-CM

## 2017-06-18 DIAGNOSIS — R140 Abdominal distension (gaseous): Secondary | ICD-10-CM

## 2017-06-18 LAB — IGA: IGA: 169 mg/dL (ref 68–378)

## 2017-06-18 NOTE — Progress Notes (Addendum)
[facility] Consult Note:  History: Pamela Sanders 06/18/2017  Referring physician: [name], NP  Reason for consult/chief complaint: Abdominal Pain (epigastric - sharp- resolved); Constipation (on and off); Diarrhea (on and off); and Nausea (onset several yrs)   Subjective  HPI:  This is a 52 year old woman referred by primary care noted above for a constellation of GI symptoms.  She was last seen by them in September, at which point she was describing a few months of intermittent sharp epigastric pain that apparently resolved on Zantac.  She had previously tried Prevacid but it gave her diarrhea. Her more chronic symptoms are many years of upper abdominal bloating with a burning discomfort as well as alternating constipation and diarrhea.  Longer she will go without a BM is a few days.  Usually she has one every other day but will often feel the need for urgent BM and sometimes have a loose stool but otherwise may have nothing.  She has had previous workup outlined below. PCP visit 03/05/17 c/o epigastric pain, pevacid caused diarrhea She was seen in the [facility] in February 2016, planning of chronic right lower quadrant pain.  They noted she had had a previous appendectomy and also a previous exploratory laparotomy for abdominal pain.  She was found to have a ruptured ovarian cyst.  At that time, she was complaining of sharp right lower quadrant pain with alternating constipation and diarrhea.  Reports indicated previous colonoscopy and upper endoscopy in 2006. A colonoscopy was performed 08/03/2014, but that report cannot be viewed in the available online records. Dicyclomine no help  She describes a fairly healthy diet and no obvious triggers for the symptoms.  Many years ago when she was in graduate school and had a different job, she felt that stress was a significant trigger, but no longer feels that is the case.  ROS:  Review of Systems    Constitutional: Negative for appetite change and unexpected weight change.  HENT: Negative for mouth sores and voice change.   Eyes: Negative for pain and redness.  Respiratory: Negative for cough and shortness of breath.   Cardiovascular: Negative for chest pain and palpitations.  Genitourinary: Negative for dysuria and hematuria.  Musculoskeletal: Negative for arthralgias and myalgias.  Skin: Negative for pallor and rash.  Neurological: Negative for weakness and headaches.  Hematological: Negative for adenopathy.     Past Medical History: Past Medical History:  Diagnosis Date  . Allergic rhinitis   . Depression (emotion)   . DJD (degenerative joint disease)    chronic neck pain  . GERD (gastroesophageal reflux disease)   . History of diverticulitis of colon   . Migraine   . Osteoarthritis      Past Surgical History: Past Surgical History:  Procedure Laterality Date  . APPENDECTOMY    . CERVICAL DISCECTOMY     Dr. Harl Bowie 06/2011[facility]  . OVARIAN CYST SURGERY     abdominal surgery for ovarian cyst  . TISSUE GRAFT  02/07/15   pt reports gum graft for receeding gums--Dr Geralynn Ochs  . TMJ ARTHROSCOPY     TMJ surgery   She also reports exploratory laparotomy for what may have been lysis of adhesions.  Family History: Family History  Problem Relation Age of Onset  . Arthritis Unknown   . Breast cancer Unknown   . Coronary artery disease Maternal Grandmother   . Breast cancer Maternal Grandmother        carcinoid tumor in abd  .  Hypertension Unknown   . Diverticulitis Mother   . Arthritis Mother   . Pancreatic cancer Other     Social History: Social History   Socioeconomic History  . Marital status: Single    Spouse name: None  . Number of children: None  . Years of education: None  . Highest education level: None  Social Needs  . Financial resource strain: None  . Food insecurity - worry: None  . Food insecurity - inability: None  . Transportation needs -  medical: None  . Transportation needs - non-medical: None  Occupational History  . None  Tobacco Use  . Smoking status: Never Smoker  . Smokeless tobacco: Never Used  Substance and Sexual Activity  . Alcohol use: Yes    Alcohol/week: 0.0 oz    Comment: 2 drinks weekly  . Drug use: No  . Sexual activity: None  Other Topics Concern  . 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 Penicillins Hives    Outpatient Meds: Current Outpatient Medications  Medication Sig Dispense Refill  . ALPRAZolam (XANAX) 0.25 MG tablet TAKE 1 TABLET BY MOUTH TWO TIMES DAILY AS NEEDED FOR ANXIETY 30 tablet 0  . calcium citrate-vitamin D (CITRACAL+D) 315-200 MG-UNIT per tablet Take 1 tablet by mouth 2 (two) times daily.      . eszopiclone (LUNESTA) 2 MG TABS tablet TAKE ONE TABLET BY MOUTH AT BEDTIME AS NEEDED (TAKE IMMEDIATELY BEFORE BEDTIME) 30 tablet 0  . ETODOLAC PO Take 400 mg by mouth 2 (two) times daily as needed.     . gabapentin (NEURONTIN) 300 MG capsule Take 1 capsule (300 mg total) by mouth 2 (two) times daily. 4 capsule 0  . loratadine (CLARITIN) 10 MG tablet Take 10 mg by mouth daily.      . metaxalone (SKELAXIN) 800 MG tablet Take 800 mg by mouth 3 (three) times daily as needed. PRN     . Multiple Vitamins-Minerals (CENTRUM PO) Take by mouth daily.      . ranitidine (ZANTAC) 150 MG tablet Take 1 tablet (150 mg total) by mouth 2 (two) times daily. 60 tablet 2  . SUMAtriptan (IMITREX) 50 MG tablet Take 100 mg by mouth every 2 (two) hours as needed.     . WELLBUTRIN XL 150 MG 24 hr tablet TAKE 2 TABLETS BY MOUTH DAILY. 180 tablet 1   No current facility-administered medications for this visit.        ___________________________________________________________________ Objective   Exam:  BP 98/62   Pulse 90   Ht 5\' 7"  (1.702 m)   Wt 173 lb 9.6 oz (78.7 kg)   BMI 27.19 kg/m    General: this is a(n) well-appearing and pleasant woman  Eyes: sclera anicteric, no redness  ENT: oral mucosa moist without lesions, no cervical or supraclavicular lymphadenopathy, good dentition  CV: RRR without murmur, S1/S2, no JVD, no peripheral edema  Resp: clear to auscultation bilaterally, normal RR and effort noted  GI: soft, no tenderness, with active bowel sounds. No guarding or palpable organomegaly noted.  Skin; warm and dry, no rash or jaundice noted  Neuro: awake, alert and oriented  x 3. Normal gross motor function and fluent speech  Labs:  CMP Latest Ref Rng & Units 03/05/2017 11/10/2016 04/15/2015  Glucose 70 - 99 mg/dL 83 90 77  BUN 6 - 23 mg/dL 10 12 11   Creatinine 0.40 - 1.20 mg/dL 0.86 0.88 0.86  Sodium 135 - 145 mEq/L 135 139 143  Potassium 3.5 - 5.1 mEq/L 4.2 4.3 3.9  Chloride 96 - 112 mEq/L 102 106 106  CO2 19 - 32 mEq/L 27 26 29   Calcium 8.4 - 10.5 mg/dL 9.7 9.3 9.3  Total Protein 6.0 - 8.3 g/dL 7.0 6.8 6.6  Total Bilirubin 0.2 - 1.2 mg/dL 0.3 0.3 0.5  Alkaline Phos 39 - 117 U/L 72 66 76  AST 0 - 37 U/L 19 18 23   ALT 0 - 35 U/L 18 15 16    CBC Latest Ref Rng & Units 11/10/2016 04/15/2015 08/09/2013  WBC 4.0 - 10.5 K/uL 6.6 6.0 4.8  Hemoglobin 12.0 - 15.0 g/dL 13.3 12.9 13.2  Hematocrit 36.0 - 46.0 % 40.5 40.7 39.1  Platelets 150.0 - 400.0 K/uL 212.0 214.0 214   Neg UBT  Radiologic Studies:  nml abd [place] 03/07/17 03/2012 - ? Low grade RLQ bowel obstruction - did not have surgery Maybe had EGD 2007 [facility]  Assessment: Encounter Diagnoses  Name Primary?  Marland Kitchen Altered bowel function Yes  . Abdominal bloating   . Upper abdominal pain     Overall, her symptoms are most consistent with IBS with alternating constipation and diarrhea.  Small bowel bacterial  overgrowth seems less likely without obvious risk factors.  Plan:  TTG antibody Trial of Citrucel daily.  I doubt a different antispasmodic would be of much help. I have no current plans for endoscopic procedures as I think they would probably be of low yield in her case. No imaging planned, does not sound obstructed.  Follow-up in several weeks.  If not much improved, consider trial of rifaximin.   Thank you for the courtesy of this consult.  Please call me with any questions or concerns.  Nelida Meuse III  CC: [name], NP   Addendum: Colonoscopy 08/03/14 [facility] Dr. Angelica Pou Left sided diverticulosis, hemorrhoids, o/w normal study   H. Loletha Carrow, MD 06/24/17

## 2017-06-18 NOTE — Patient Instructions (Addendum)
If you are age 53 or older, your body mass index should be between 23-30. Your Body mass index is 27.19 kg/m. If this is out of the aforementioned range listed, please consider follow up with your Primary Care Provider.  If you are age 59 or younger, your body mass index should be between 19-25. Your Body mass index is 27.19 kg/m. If this is out of the aformentioned range listed, please consider follow up with your Primary Care Provider.   Your physician has requested that you go to the basement for the lab work before leaving today.   Food Guidelines for a sensitive stomach  Many people have difficulty digesting certain foods, causing a variety of distressing and embarrassing symptoms such as abdominal pain, bloating and gas.  These foods may need to be avoided or consumed in small amounts.  Here are some tips that might be helpful for you.  1.   Lactose intolerance is the difficulty or complete inability to digest lactose, the natural sugar in milk and anything made from milk.  This condition is harmless, common, and can begin any time during life.  Some people can digest a modest amount of lactose while others cannot tolerate any.  Also, not all dairy products contain equal amounts of lactose.  For example, hard cheeses such as parmesan have less lactose than soft cheeses such as cheddar.  Yogurt has less lactose than milk or cheese.  Many packaged foods (even many brands of bread) have milk, so read ingredient lists carefully.  It is difficult to test for lactose intolerance, so just try avoiding lactose as much as possible for a week and see what happens with your symptoms.  If you seem to be lactose intolerant, the best plan is to avoid it (but make sure you get calcium from another source).  The next best thing is to use lactase enzyme supplements, available over the counter everywhere.  Just know that many lactose intolerant people need to take several tablets with each serving of dairy to avoid  symptoms.  Lastly, a lot of restaurant food is made with milk or butter.  Many are things you might not suspect, such as mashed potatoes, rice and pasta (cooked with butter) and "grilled" items.  If you are lactose intolerant, it never hurts to ask your server what has milk or butter.  2.   Fiber is an important part of your diet, but not all fiber is well-tolerated.  Insoluble fiber such as bran is often consumed by normal gut bacteria and converted into gas.  Soluble fiber such as oats, squash, carrots and green beans are typically tolerated better.  3.   Some types of carbohydrates can be poorly digested.  Examples include: fructose (apples, cherries, pears, raisins and other dried fruits), fructans (onions, zucchini, large amounts of wheat), sorbitol/mannitol/xylitol and sucralose/Splenda (common artificial sweeteners), and raffinose (lentils, broccoli, cabbage, asparagus, brussel sprouts, many types of beans).  Do a Development worker, community for [employer] and you will find helpful information. Beano, a dietary supplement, will often help with raffinose-containing foods.  As with lactase tablets, you may need several per serving.  4.   Whenever possible, avoid processed food&meats and chemical additives.  High fructose corn syrup, a common sweetener, may be difficult to digest.  Eggs and soy (comes from the soybean, and added to many foods now) are the other most common bloating/gassy foods.  - Dr. [facility]

## 2017-06-21 LAB — TISSUE TRANSGLUTAMINASE, IGA: (tTG) Ab, IgA: 1 U/mL

## 2017-07-14 ENCOUNTER — Other Ambulatory Visit: Payer: Self-pay | Admitting: Family

## 2017-07-15 NOTE — Telephone Encounter (Signed)
Reviewed PMP aware, rx sent.

## 2017-07-15 NOTE — Telephone Encounter (Signed)
Requesting: alprazolam Contract: 11/10/16  UDS: 11/10/16 Last Visit: 06/18/17 Next Visit: none Last Refill: 05/14/17  Please Advise

## 2017-07-21 ENCOUNTER — Ambulatory Visit: Payer: PRIVATE HEALTH INSURANCE | Admitting: Gastroenterology

## 2017-08-05 ENCOUNTER — Ambulatory Visit: Payer: PRIVATE HEALTH INSURANCE | Admitting: Gastroenterology

## 2017-08-10 ENCOUNTER — Telehealth: Payer: Self-pay | Admitting: Gastroenterology

## 2017-08-10 ENCOUNTER — Ambulatory Visit: Payer: PRIVATE HEALTH INSURANCE | Admitting: Gastroenterology

## 2017-08-10 NOTE — Telephone Encounter (Signed)
Noted  

## 2017-08-10 NOTE — Progress Notes (Deleted)
     [facility] Progress Note  Chief Complaint: abd pain and bloating, altered BMs  Subjective  History:  Seen Jan 19 with chronic abd pain, bloating, alternating BMs. Extensive prior w/u over years, otlined in that note. TTG Ab drawn, fiber recommended (she had reported dicyclomine previously no help)  ROS: Cardiovascular:  no chest pain Respiratory: no dyspnea  The patient's Past Medical, Family and Social History were reviewed and are on file in the EMR.  Objective:  Med list reviewed  Current Outpatient Medications:  .  ALPRAZolam (XANAX) 0.25 MG tablet, TAKE 1 TABLET BY MOUTH TWO TIMES DAILY AS NEEDED FOR ANXIETY, Disp: 30 tablet, Rfl: 0 .  calcium citrate-vitamin D (CITRACAL+D) 315-200 MG-UNIT per tablet, Take 1 tablet by mouth 2 (two) times daily.  , Disp: , Rfl:  .  eszopiclone (LUNESTA) 2 MG TABS tablet, TAKE ONE TABLET BY MOUTH AT BEDTIME AS NEEDED (TAKE IMMEDIATELY BEFORE BEDTIME), Disp: 30 tablet, Rfl: 0 .  ETODOLAC PO, Take 400 mg by mouth 2 (two) times daily as needed. , Disp: , Rfl:  .  gabapentin (NEURONTIN) 300 MG capsule, Take 1 capsule (300 mg total) by mouth 2 (two) times daily., Disp: 4 capsule, Rfl: 0 .  loratadine (CLARITIN) 10 MG tablet, Take 10 mg by mouth daily.  , Disp: , Rfl:  .  metaxalone (SKELAXIN) 800 MG tablet, Take 800 mg by mouth 3 (three) times daily as needed. PRN , Disp: , Rfl:  .  Multiple Vitamins-Minerals (CENTRUM PO), Take by mouth daily.  , Disp: , Rfl:  .  ranitidine (ZANTAC) 150 MG tablet, Take 1 tablet (150 mg total) by mouth 2 (two) times daily., Disp: 60 tablet, Rfl: 2 .  SUMAtriptan (IMITREX) 50 MG tablet, Take 100 mg by mouth every 2 (two) hours as needed. , Disp: , Rfl:  .  WELLBUTRIN XL 150 MG 24 hr tablet, TAKE 2 TABLETS BY MOUTH DAILY., Disp: 180 tablet, Rfl: 1   Vital signs in last 24 hrs: There were no vitals filed for this visit.  Physical Exam  ***  HEENT: sclera anicteric, oral mucosa moist without  lesions  Neck: supple, no thyromegaly, JVD or lymphadenopathy  Cardiac: RRR without murmurs, S1S2 heard, no peripheral edema  Pulm: clear to auscultation bilaterally, normal RR and effort noted  Abdomen: soft, *** tenderness, with active bowel sounds. No guarding or palpable hepatosplenomegaly.  Skin; warm and dry, no jaundice or rash  Recent Labs:  ttg neg, Iga nml  Radiologic studies:    @ASSESSMENTPLANBEGIN @ Assessment: No diagnosis found.    Plan:    Total time *** minutes, over half spent in counseling and coordination of care.   Pamela Sanders

## 2017-09-14 ENCOUNTER — Encounter: Payer: Self-pay | Admitting: Internal Medicine

## 2017-09-14 ENCOUNTER — Ambulatory Visit: Payer: PRIVATE HEALTH INSURANCE | Admitting: Internal Medicine

## 2017-09-14 VITALS — BP 118/74 | HR 103 | Temp 99.4°F | Resp 16 | Ht 67.0 in | Wt 174.1 lb

## 2017-09-14 DIAGNOSIS — B349 Viral infection, unspecified: Secondary | ICD-10-CM

## 2017-09-14 MED ORDER — OSELTAMIVIR PHOSPHATE 75 MG PO CAPS: 75.0000 mg | ORAL_CAPSULE | Freq: Two times a day (BID) | ORAL | 0 refills | Status: DC

## 2017-09-14 MED FILL — OSELTAMIVIR PHOSPHATE 75 MG: 75 | 5 days supply | Qty: 10 | Fill #0

## 2017-09-14 NOTE — Progress Notes (Signed)
Pre visit review using our clinic review tool, if applicable. No additional management support is needed unless otherwise documented below in the visit note. 

## 2017-09-14 NOTE — Patient Instructions (Signed)
Rest, fluids , tylenol  For cough:  Take Mucinex DM twice a day as needed until better  For nasal congestion: Use OTC Flonase : 2 nasal sprays on each side of the nose in the morning until you feel better    Take Tamiflu as prescribed  Call if not gradually better over the next  10 days  Call anytime if the symptoms are severe    Influenza, Adult Influenza, more commonly known as "the flu," is a viral infection that primarily affects the respiratory tract. The respiratory tract includes organs that help you breathe, such as the lungs, nose, and throat. The flu causes many common cold symptoms, as well as a high fever and body aches. The flu spreads easily from person to person (is contagious). Getting a flu shot (influenza vaccination) every year is the best way to prevent influenza. What are the causes? Influenza is caused by a virus. You can catch the virus by:  Breathing in droplets from an infected person's cough or sneeze.  Touching something that was recently contaminated with the virus and then touching your mouth, nose, or eyes.  What increases the risk? The following factors may make you more likely to get the flu:  Not cleaning your hands frequently with soap and water or alcohol-based hand sanitizer.  Having close contact with many people during cold and flu season.  Touching your mouth, eyes, or nose without washing or sanitizing your hands first.  Not drinking enough fluids or not eating a healthy diet.  Not getting enough sleep or exercise.  Being under a high amount of stress.  Not getting a yearly (annual) flu shot.  You may be at a higher risk of complications from the flu, such as a severe lung infection (pneumonia), if you:  Are over the age of 65.  Are pregnant.  Have a weakened disease-fighting system (immune system). You may have a weakened immune system if you: ? Have HIV or AIDS. ? Are undergoing chemotherapy. ? Aretaking medicines that  reduce the activity of (suppress) the immune system.  Have a long-term (chronic) illness, such as heart disease, kidney disease, diabetes, or lung disease.  Have a liver disorder.  Are obese.  Have anemia.  What are the signs or symptoms? Symptoms of this condition typically last 4-10 days and may include:  Fever.  Chills.  Headache, body aches, or muscle aches.  Sore throat.  Cough.  Runny or congested nose.  Chest discomfort and cough.  Poor appetite.  Weakness or tiredness (fatigue).  Dizziness.  Nausea or vomiting.  How is this diagnosed? This condition may be diagnosed based on your medical history and a physical exam. Your health care provider may do a nose or throat swab test to confirm the diagnosis. How is this treated? If influenza is detected early, you can be treated with antiviral medicine that can reduce the length of your illness and the severity of your symptoms. This medicine may be given by mouth (orally) or through an IV tube that is inserted in one of your veins. The goal of treatment is to relieve symptoms by taking care of yourself at home. This may include taking over-the-counter medicines, drinking plenty of fluids, and adding humidity to the air in your home. In some cases, influenza goes away on its own. Severe influenza or complications from influenza may be treated in a hospital. Follow these instructions at home:  Take over-the-counter and prescription medicines only as told by your health care provider.  Use a cool mist humidifier to add humidity to the air in your home. This can make breathing easier.  Rest as needed.  Drink enough fluid to keep your urine clear or pale yellow.  Cover your mouth and nose when you cough or sneeze.  Wash your hands with soap and water often, especially after you cough or sneeze. If soap and water are not available, use hand sanitizer.  Stay home from work or school as told by your health care  provider. Unless you are visiting your health care provider, try to avoid leaving home until your fever has been gone for 24 hours without the use of medicine.  Keep all follow-up visits as told by your health care provider. This is important. How is this prevented?  Getting an annual flu shot is the best way to avoid getting the flu. You may get the flu shot in late summer, fall, or winter. Ask your health care provider when you should get your flu shot.  Wash your hands often or use hand sanitizer often.  Avoid contact with people who are sick during cold and flu season.  Eat a healthy diet, drink plenty of fluids, get enough sleep, and exercise regularly. Contact a health care provider if:  You develop new symptoms.  You have: ? Chest pain. ? Diarrhea. ? A fever.  Your cough gets worse.  You produce more mucus.  You feel nauseous or you vomit. Get help right away if:  You develop shortness of breath or difficulty breathing.  Your skin or nails turn a bluish color.  You have severe pain or stiffness in your neck.  You develop a sudden headache or sudden pain in your face or ear.  You cannot stop vomiting. This information is not intended to replace advice given to you by your health care provider. Make sure you discuss any questions you have with your health care provider. Document Released: 05/22/2000 Document Revised: 10/31/2015 Document Reviewed: 03/19/2015 Elsevier Interactive Patient Education  2017 [employer].

## 2017-09-14 NOTE — Progress Notes (Signed)
Subjective:    Patient ID: Pamela Sanders, female    DOB: 09/26/1965, 52 y.o.   MRN: [id number]  DOS:  09/14/2017 Type of visit - description : Acute visit Interval history: Patient woke up today at 4 AM due to her symptoms: Achy all over, headache, joint aches.  Had a temperature of around 100, malaise. Partner had respiratory symptoms 2 weeks ago but she is feeling better. She is a Community education officer at [employer], has been in the hospital setting.  Denies any contact with exotic viruses or illnesses.   Review of Systems Some nausea.  No vomiting or diarrhea No rash Mild cough No sore throat, mild runny nose  Past Medical History:  Diagnosis Date  . Allergic rhinitis   . Depression (emotion)   . DJD (degenerative joint disease)    chronic neck pain  . GERD (gastroesophageal reflux disease)   . History of diverticulitis of colon   . Migraine   . Osteoarthritis     Past Surgical History:  Procedure Laterality Date  . APPENDECTOMY    . CERVICAL DISCECTOMY     Dr. Harl Bowie 06/2011[facility]  . OVARIAN CYST SURGERY     abdominal surgery for ovarian cyst  . TISSUE GRAFT  02/07/15   pt reports gum graft for receeding gums--Dr Geralynn Ochs  . TMJ ARTHROSCOPY     TMJ surgery    Social History   Socioeconomic History  . Marital status: Single    Spouse name: Not on file  . Number of children: Not on file  . Years of education: Not on file  . Highest education level: Not on file  Occupational History  . Not on file  Social Needs  . Financial resource strain: Not on file  . Food insecurity:    Worry: Not on file    Inability: Not on file  . Transportation needs:    Medical: Not on file    Non-medical: Not on file  Tobacco Use  . Smoking status: Never Smoker  . Smokeless tobacco: Never Used  Substance and Sexual Activity  . Alcohol use: Yes    Alcohol/week: 0.0 oz    Comment: 2 drinks weekly  . Drug use: No  . Sexual activity: Not on file  Lifestyle  . Physical  activity:    Days per week: Not on file    Minutes per session: Not on file  . Stress: Not on file  Relationships  . Social connections:    Talks on phone: Not on file    Gets together: Not on file    Attends religious service: Not on file    Active member of club or organization: Not on file    Attends meetings of clubs or organizations: Not on file    Relationship status: Not on file  . Intimate partner violence:    Fear of current or ex partner: Not on file    Emotionally abused: Not on file    Physically abused: Not on file    Forced sexual activity: Not on file  Other Topics Concern  . 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 Always use your most recent med list.          ALPRAZolam 0.25 MG tablet Commonly known as:  XANAX TAKE 1 TABLET BY MOUTH TWO TIMES DAILY AS NEEDED FOR ANXIETY   calcium citrate-vitamin D 315-200 MG-UNIT tablet Commonly known as:  CITRACAL+D Take 1 tablet by mouth 2 (two) times daily.   CENTRUM PO Take by mouth daily.   eszopiclone 2 MG Tabs tablet Commonly known as:  LUNESTA TAKE ONE TABLET BY MOUTH AT BEDTIME AS NEEDED (TAKE IMMEDIATELY BEFORE BEDTIME)   ETODOLAC PO Take 400 mg by mouth 2 (two) times daily as needed.   famotidine 10 MG chewable tablet Commonly known as:  PEPCID AC Chew 10 mg by mouth 2 (two) times daily.   gabapentin 300 MG capsule Commonly known as:  NEURONTIN Take 1 capsule (300 mg total) by mouth 2 (two) times daily.   loratadine 10 MG tablet Commonly known as:  CLARITIN Take 10 mg by mouth daily.   metaxalone 800 MG tablet Commonly known as:   SKELAXIN Take 800 mg by mouth 3 (three) times daily as needed. PRN   oseltamivir 75 MG capsule Commonly known as:  TAMIFLU Take 1 capsule (75 mg total) by mouth 2 (two) times daily.   ranitidine 150 MG tablet Commonly known as:  ZANTAC Take 1 tablet (150 mg total) by mouth 2 (two) times daily.   SUMAtriptan 50 MG tablet Commonly known as:  IMITREX Take 100 mg by mouth every 2 (two) hours as needed.   WELLBUTRIN XL 150 MG 24 hr tablet Generic drug:  buPROPion TAKE 2 TABLETS BY MOUTH DAILY.          Objective:   Physical Exam BP 118/74 (BP Location: Left Arm, Patient Position: Sitting, Cuff Size: Small)   Pulse (!) 103   Temp 99.4 F (37.4 C) (Oral)   Resp 16   Ht 5\' 7"  (1.702 m)   Wt 174 lb 2 oz (79 kg)   SpO2 94%   BMI 27.27 kg/m  General:   Well developed, well nourished . NAD.  HEENT:  Normocephalic . Face symmetric, atraumatic.  TMs normal, throat normal, nose is slightly congested Lungs:  CTA B Normal respiratory effort, no intercostal retractions, no accessory muscle use. Heart: RRR,  no murmur.  No pretibial edema bilaterally  Skin: Not pale. Not jaundice Neurologic:  alert & oriented X3.  Speech normal, gait appropriate for age and unassisted Psych--  Cognition and judgment appear intact.  Cooperative with normal attention span and concentration.  Behavior appropriate. No anxious or depressed appearing.      Assessment & Plan:    52 year old female with history that includes allergies, depression, GERD, insomnia, migraines presents with  Viral syndrome: Symptoms consistent with a viral syndrome, nontoxic-appearing, started a few hours ago.  We talk about further testing versus empiric treatment for influenza and that is what we agreed on. We also discussed Tamiflu versus Xofluza, and eventually agree on Tamiflu since she is not toxic appearing and we are treating her empirically. Call if not better.  See AVS

## 2017-09-17 ENCOUNTER — Telehealth: Payer: Self-pay | Admitting: Gastroenterology

## 2017-09-17 ENCOUNTER — Ambulatory Visit: Payer: PRIVATE HEALTH INSURANCE | Admitting: Gastroenterology

## 2017-09-17 NOTE — Telephone Encounter (Signed)
Noted  

## 2017-09-21 ENCOUNTER — Other Ambulatory Visit: Payer: Self-pay | Admitting: Internal Medicine

## 2017-09-21 ENCOUNTER — Encounter: Payer: Self-pay | Admitting: Internal Medicine

## 2017-09-21 MED ORDER — AZITHROMYCIN 250 MG PO TABS: ORAL_TABLET | ORAL | 0 refills | Status: DC

## 2017-09-29 ENCOUNTER — Other Ambulatory Visit: Payer: Self-pay | Admitting: Family

## 2017-10-19 ENCOUNTER — Encounter: Payer: Self-pay | Admitting: Gastroenterology

## 2017-10-19 ENCOUNTER — Ambulatory Visit: Payer: PRIVATE HEALTH INSURANCE | Admitting: Gastroenterology

## 2017-10-19 VITALS — BP 100/70 | HR 80 | Ht 67.0 in | Wt 175.0 lb

## 2017-10-19 DIAGNOSIS — K582 Mixed irritable bowel syndrome: Secondary | ICD-10-CM

## 2017-10-19 DIAGNOSIS — K219 Gastro-esophageal reflux disease without esophagitis: Secondary | ICD-10-CM | POA: Diagnosis not present

## 2017-10-19 MED ORDER — HYOSCYAMINE SULFATE 0.125 MG SL SUBL: 0.1250 mg | SUBLINGUAL_TABLET | Freq: Four times a day (QID) | SUBLINGUAL | 0 refills | Status: DC | PRN

## 2017-10-19 NOTE — Progress Notes (Signed)
[facility] Progress Note  Chief Complaint: IBS  Subjective  History:  This is follow-up for a 52 year old woman I initially saw in January of this year with years of chronic symptoms consistent with a functional bowel disorder, and she has had extensive prior work-up.  She was same day cancellation for visits on March 5 and April 12 due to respiratory illness. She continues to have intermittent crampy lower abdominal pain with alternating constipation and diarrhea.  At most, she will have 4-5 BMs per day, but sometimes no BM for about 2 days.  There is no rectal bleeding, appetite is good and weight stable.  She also continues to have GERD symptoms with heartburn bloating and belching.  ROS: Cardiovascular:  no chest pain Respiratory: no dyspnea Intermittent anxiety Remainder of systems negative except as above  The patient's Past Medical, Family and Social History were reviewed and are on file in the EMR.  Objective:  Med list reviewed  Current Outpatient Medications:  .  ALPRAZolam (XANAX) 0.25 MG tablet, TAKE 1 TABLET BY MOUTH TWO TIMES DAILY AS NEEDED FOR ANXIETY, Disp: 30 tablet, Rfl: 0 .  calcium citrate-vitamin D (CITRACAL+D) 315-200 MG-UNIT per tablet, Take 1 tablet by mouth 2 (two) times daily.  , Disp: , Rfl:  .  eszopiclone (LUNESTA) 2 MG TABS tablet, TAKE ONE TABLET BY MOUTH AT BEDTIME AS NEEDED (TAKE IMMEDIATELY BEFORE BEDTIME), Disp: 30 tablet, Rfl: 0 .  famotidine (PEPCID AC) 10 MG chewable tablet, Chew 10 mg by mouth 2 (two) times daily., Disp: , Rfl:  .  gabapentin (NEURONTIN) 300 MG capsule, Take 1 capsule (300 mg total) by mouth 2 (two) times daily., Disp: 4 capsule, Rfl: 0 .  loratadine (CLARITIN) 10 MG tablet, Take 10 mg by mouth daily.  , Disp: , Rfl:  .  metaxalone (SKELAXIN) 800 MG tablet, Take 800 mg by mouth 3 (three) times daily as needed. PRN , Disp: , Rfl:  .  Multiple Vitamins-Minerals (CENTRUM PO), Take by mouth daily.  , Disp: , Rfl:  .   SUMAtriptan (IMITREX) 50 MG tablet, Take 100 mg by mouth every 2 (two) hours as needed. , Disp: , Rfl:  .  WELLBUTRIN XL 150 MG 24 hr tablet, TAKE 2 TABLETS BY MOUTH DAILY., Disp: 180 tablet, Rfl: 1 .  hyoscyamine (LEVSIN SL) 0.125 MG SL tablet, Place 1 tablet (0.125 mg total) under the tongue every 6 (six) hours as needed., Disp: 20 tablet, Rfl: 0   Vital signs in last 24 hrs: Vitals:   10/19/17 1403  BP: 100/70  Pulse: 80    Physical Exam  Pleasant and well-appearing woman  HEENT: sclera anicteric, oral mucosa moist without lesions  Neck: supple, no thyromegaly, JVD or lymphadenopathy  Cardiac: RRR without murmurs, S1S2 heard, no peripheral edema  Pulm: clear to auscultation bilaterally, normal RR and effort noted  Abdomen: soft, no tenderness, with active bowel sounds. No guarding or palpable hepatosplenomegaly.  Skin; warm and dry, no jaundice or rash  Recent Labs:  TTG, IgA antibody 1 (negative) Total IgA level 169 (normal)   @ASSESSMENTPLANBEGIN @ Assessment: Encounter Diagnoses  Name Primary?  . Irritable bowel syndrome with both constipation and diarrhea Yes  . Gastroesophageal reflux disease, esophagitis presence not specified    Chronic IBS with alternating bowel habits.  No risk factors for bacterial overgrowth.  Nevertheless, we talked about the role of empiric therapy for SIBO.  She has not had cholecystectomy, making bile acid diarrhea less likely.  However,  we talked about a trial of bile acid sequestrant such as cholestyramine. She did not have much improvement in the past with dicyclomine. Some written dietary advice was given I reassured her that I believe a work-up is been complete. The chronic nature and treatment of IBS were discussed. The cause is not completely understood, but is likely to be a combination of genetics, diet, stress, visceral hypersensitivity and the gut microbiome.  The available treatments aim to control symptoms even if unable to  "cure" the condition.  While not physically harmful, IBS can have a significant impact on quality of life.   Plan: 2-week trial of hyoscyamine to see if that might work better than dicyclomine did She will call with an update in about 2 weeks.  If not much improved, treatment with rifaximin.   Total time 20 minutes, over half spent face-to-face with patient in counseling and coordination of care.   Nelida Meuse III

## 2017-10-19 NOTE — Patient Instructions (Addendum)
If you are age 53 or older, your body mass index should be between 23-30. Your Body mass index is 27.41 kg/m. If this is out of the aforementioned range listed, please consider follow up with your Primary Care Provider.  If you are age 90 or younger, your body mass index should be between 19-25. Your Body mass index is 27.41 kg/m. If this is out of the aformentioned range listed, please consider follow up with your Primary Care Provider.   Please cal in a few weeks with an updaye on symptoms. [phone]  Food Guidelines for a sensitive stomach  Many people have difficulty digesting certain foods, causing a variety of distressing and embarrassing symptoms such as abdominal pain, bloating and gas.  These foods may need to be avoided or consumed in small amounts.  Here are some tips that might be helpful for you.  1.   Lactose intolerance is the difficulty or complete inability to digest lactose, the natural sugar in milk and anything made from milk.  This condition is harmless, common, and can begin any time during life.  Some people can digest a modest amount of lactose while others cannot tolerate any.  Also, not all dairy products contain equal amounts of lactose.  For example, hard cheeses such as parmesan have less lactose than soft cheeses such as cheddar.  Yogurt has less lactose than milk or cheese.  Many packaged foods (even many brands of bread) have milk, so read ingredient lists carefully.  It is difficult to test for lactose intolerance, so just try avoiding lactose as much as possible for a week and see what happens with your symptoms.  If you seem to be lactose intolerant, the best plan is to avoid it (but make sure you get calcium from another source).  The next best thing is to use lactase enzyme supplements, available over the counter everywhere.  Just know that many lactose intolerant people need to take several tablets with each serving of dairy to avoid symptoms.  Lastly, a lot of  restaurant food is made with milk or butter.  Many are things you might not suspect, such as mashed potatoes, rice and pasta (cooked with butter) and "grilled" items.  If you are lactose intolerant, it never hurts to ask your server what has milk or butter.  2.   Fiber is an important part of your diet, but not all fiber is well-tolerated.  Insoluble fiber such as bran is often consumed by normal gut bacteria and converted into gas.  Soluble fiber such as oats, squash, carrots and green beans are typically tolerated better.  3.   Some types of carbohydrates can be poorly digested.  Examples include: fructose (apples, cherries, pears, raisins and other dried fruits), fructans (onions, zucchini, large amounts of wheat), sorbitol/mannitol/xylitol and sucralose/Splenda (common artificial sweeteners), and raffinose (lentils, broccoli, cabbage, asparagus, brussel sprouts, many types of beans).  Do a Development worker, community for [employer] and you will find helpful information. Beano, a dietary supplement, will often help with raffinose-containing foods.  As with lactase tablets, you may need several per serving.  4.   Whenever possible, avoid processed food&meats and chemical additives.  High fructose corn syrup, a common sweetener, may be difficult to digest.  Eggs and soy (comes from the soybean, and added to many foods now) are other common bloating/gassy foods.  - Dr. [facility]

## 2017-12-19 ENCOUNTER — Encounter: Payer: Self-pay | Admitting: Family

## 2017-12-20 MED ORDER — ZOLPIDEM TARTRATE 5 MG PO TABS: 5.0000 mg | ORAL_TABLET | Freq: Every evening | ORAL | 0 refills | Status: DC | PRN

## 2018-01-02 ENCOUNTER — Encounter: Payer: Self-pay | Admitting: Family

## 2018-01-05 ENCOUNTER — Encounter: Payer: Self-pay | Admitting: Family

## 2018-01-05 ENCOUNTER — Other Ambulatory Visit: Payer: Self-pay | Admitting: Family

## 2018-01-05 DIAGNOSIS — G43909 Migraine, unspecified, not intractable, without status migrainosus: Secondary | ICD-10-CM

## 2018-01-05 NOTE — Telephone Encounter (Unsigned)
Copied from [place] [id number]. Topic: Quick Communication - Rx Refill/Question >> Jan 05, 2018  2:57 PM Mcneil, Ja-Kwan wrote: Medication: gabapentin (NEURONTIN) 300 MG capsule   Pt states she only has 2 days worth of medication remaining and she would like it refilled right away Has the patient contacted their pharmacy? No   Preferred Pharmacy (with phone number or street name): [facility], [place] [phone] (Phone) [phone] (Fax)   Agent: Please be advised that RX refills may take up to 3 business days. We ask that you follow-up with your pharmacy.

## 2018-01-06 MED ORDER — GABAPENTIN 300 MG PO CAPS: 300.0000 mg | ORAL_CAPSULE | Freq: Two times a day (BID) | ORAL | 5 refills | Status: DC

## 2018-01-06 NOTE — Telephone Encounter (Signed)
LOV 09/14/17 [name] Last refill 08/06/11/ #4 with 0 refill

## 2018-01-07 NOTE — Addendum Note (Signed)
Addended by: [name] on: 01/07/2018 12:54 PM   Modules accepted: Orders

## 2018-01-10 ENCOUNTER — Encounter: Payer: Self-pay | Admitting: Neurology

## 2018-01-11 ENCOUNTER — Encounter: Payer: Self-pay | Admitting: Neurology

## 2018-01-17 ENCOUNTER — Encounter: Payer: Self-pay | Admitting: Family

## 2018-01-17 ENCOUNTER — Ambulatory Visit: Payer: PRIVATE HEALTH INSURANCE | Admitting: Family

## 2018-01-17 DIAGNOSIS — Z23 Encounter for immunization: Secondary | ICD-10-CM

## 2018-01-17 DIAGNOSIS — H9311 Tinnitus, right ear: Secondary | ICD-10-CM

## 2018-01-17 DIAGNOSIS — G4700 Insomnia, unspecified: Secondary | ICD-10-CM | POA: Diagnosis not present

## 2018-01-17 DIAGNOSIS — G43009 Migraine without aura, not intractable, without status migrainosus: Secondary | ICD-10-CM

## 2018-01-17 DIAGNOSIS — F329 Major depressive disorder, single episode, unspecified: Secondary | ICD-10-CM

## 2018-01-17 DIAGNOSIS — F419 Anxiety disorder, unspecified: Secondary | ICD-10-CM

## 2018-01-17 DIAGNOSIS — F32A Depression, unspecified: Secondary | ICD-10-CM

## 2018-01-17 MED ORDER — WELLBUTRIN XL 150 MG PO TB24: 300.0000 mg | ORAL_TABLET | Freq: Every day | ORAL | 1 refills | Status: DC

## 2018-01-17 NOTE — Patient Instructions (Signed)
Please go to the lab prior to leaving.

## 2018-01-17 NOTE — Progress Notes (Signed)
Subjective:    Patient ID: Pamela Sanders, female    DOB: 09-03-1965, 52 y.o.   MRN: [id number]  HPI  Insomnia-  Continues ambien prn.  Falls asleep but cannot stay asleep.  Rare ETOH, 2 cups of coffee in AM and an Ice tea with lunch.   Depression- continues wellbutrin.  Reports mood is good.   Anxiety- uses xanax as needed. Usually 1-2 times a month at night.   Migraines- trying to establish with a new Neurologist.  Reports that her migraines are well controlled.   Tinnitus- right ear.  Seems a bit worse. Would like to return to see ENT. Has seen them remotely for this.   Review of Systems See HPI  Past Medical History:  Diagnosis Date  . Allergic rhinitis   . Depression (emotion)   . DJD (degenerative joint disease)    chronic neck pain  . GERD (gastroesophageal reflux disease)   . History of diverticulitis of colon   . Migraine   . Osteoarthritis      Social History   Socioeconomic History  . Marital status: Single    Spouse name: Not on file  . Number of children: Not on file  . Years of education: Not on file  . Highest education level: Not on file  Occupational History  . Not on file  Social Needs  . Financial resource strain: Not on file  . Food insecurity:    Worry: Not on file    Inability: Not on file  . Transportation needs:    Medical: Not on file    Non-medical: Not on file  Tobacco Use  . Smoking status: Never Smoker  . Smokeless tobacco: Never Used  Substance and Sexual Activity  . Alcohol use: Yes    Alcohol/week: 0.0 standard drinks    Comment: 2 drinks weekly  . Drug use: No  . Sexual activity: Not on file  Lifestyle  . Physical activity:    Days per week: Not on file    Minutes per session: Not on file  . Stress: Not on file  Relationships  . Social connections:    Talks on phone: Not on file    Gets together: Not on file    Attends religious service: Not on file    Active member of club or organization: Not on file    Attends  meetings of clubs or organizations: Not on file    Relationship status: Not on file  . Intimate partner violence:    Fear of current or ex partner: Not on file    Emotionally abused: Not on file    Physically abused: Not on file    Forced sexual activity: Not on file  Other Topics Concern  . 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 APPENDECTOMY    . CERVICAL DISCECTOMY     Dr. Harl Bowie 06/2011[facility]  . OVARIAN CYST SURGERY     abdominal surgery for ovarian cyst  . TISSUE GRAFT  02/07/15   pt reports gum graft for receeding gums--Dr Geralynn Ochs  . TMJ ARTHROSCOPY  TMJ surgery    Family History  Problem Relation Age of Onset  . Arthritis Unknown   . Breast cancer Unknown   . Coronary artery disease Maternal Grandmother   . Breast cancer Maternal Grandmother        carcinoid tumor in abd  . Hypertension Unknown   . Diverticulitis Mother   . Arthritis Mother   . Pancreatic cancer Other     Allergies  Allergen Reactions  . Penicillins Hives    Current Outpatient Medications on File Prior to Visit  Medication Sig Dispense Refill  . ALPRAZolam (XANAX) 0.25 MG tablet TAKE 1 TABLET BY MOUTH TWO TIMES DAILY AS NEEDED FOR ANXIETY 30 tablet 0  . calcium citrate-vitamin D (CITRACAL+D) 315-200 MG-UNIT per tablet Take 1 tablet by mouth 2 (two) times daily.      . famotidine (PEPCID AC) 10 MG chewable tablet Chew 10 mg by mouth 2 (two) times daily.    Marland Kitchen gabapentin (NEURONTIN) 300 MG capsule Take 1 capsule (300 mg total) by mouth 2 (two) times daily. 60 capsule 5  . hyoscyamine (LEVSIN SL) 0.125 MG SL tablet Place 1 tablet (0.125 mg total) under the tongue every 6 (six) hours as needed. 20 tablet 0  .  loratadine (CLARITIN) 10 MG tablet Take 10 mg by mouth daily.      . metaxalone (SKELAXIN) 800 MG tablet Take 800 mg by mouth 3 (three) times daily as needed. PRN     . Multiple Vitamins-Minerals (CENTRUM PO) Take by mouth daily.      . SUMAtriptan (IMITREX) 50 MG tablet Take 100 mg by mouth every 2 (two) hours as needed.     . thiamine (VITAMIN B-1) 100 MG tablet Take 100 mg by mouth daily.    Marland Kitchen zolpidem (AMBIEN) 5 MG tablet Take 1 tablet (5 mg total) by mouth at bedtime as needed for sleep. 30 tablet 0   No current facility-administered medications on file prior to visit.     BP 119/82 (BP Location: Left Arm, Patient Position: Sitting, Cuff Size: Small)   Pulse 80   Temp 98.5 F (36.9 C) (Oral)   Resp 16   Ht 5\' 7"  (1.702 m)   Wt 173 lb (78.5 kg)   LMP 01/13/2018   SpO2 100%   BMI 27.10 kg/m       Objective:   Physical Exam  Constitutional: She is oriented to person, place, and time. She appears well-developed and well-nourished.  Cardiovascular: Normal rate, regular rhythm and normal heart sounds.  No murmur heard. Pulmonary/Chest: Effort normal and breath sounds normal. No respiratory distress. She has no wheezes.  Musculoskeletal: She exhibits no edema.  Neurological: She is alert and oriented to person, place, and time.  Skin: Skin is warm.  Psychiatric: She has a normal mood and affect. Her behavior is normal. Judgment and thought content normal.          Assessment & Plan:  Migraines- stable. Continue current meds. Has consult with neurology. She also wants to discuss her neck pain with neurology as her previous neurologist was following this.  Depression/anxiety- stable. Continue current medications. UDS today, controlled substance contract updated.  Tinnitus- deteriorated. Refer back to ENT.   Insomnia- discussed eliminating afternoon caffeine, continue prn ambien.   shingrix #1 today.

## 2018-01-19 LAB — PAIN MGMT, PROFILE 8 W/CONF, U
6 Acetylmorphine: NEGATIVE ng/mL (ref <10)
Alcohol Metabolites: NEGATIVE ng/mL (ref <500)
Amphetamines: NEGATIVE ng/mL (ref <500)
BUPRENORPHINE, URINE: NEGATIVE ng/mL (ref <5)
Benzodiazepines: NEGATIVE ng/mL (ref <100)
CREATININE: 17.1 mg/dL — AB
Cocaine Metabolite: NEGATIVE ng/mL (ref <150)
MARIJUANA METABOLITE: NEGATIVE ng/mL (ref <20)
MDMA: NEGATIVE ng/mL (ref <500)
Opiates: NEGATIVE ng/mL (ref <100)
Oxidant: NEGATIVE ug/mL (ref <200)
Oxycodone: NEGATIVE ng/mL (ref <100)
SPECIFIC GRAVITY: 1.004 (ref >=1.0)
pH: 6.72 (ref 4.5–9.0)

## 2018-02-07 ENCOUNTER — Encounter: Payer: Self-pay | Admitting: Internal Medicine

## 2018-02-08 ENCOUNTER — Encounter: Payer: Self-pay | Admitting: Family Medicine

## 2018-02-08 ENCOUNTER — Ambulatory Visit: Payer: PRIVATE HEALTH INSURANCE | Admitting: Family Medicine

## 2018-02-08 DIAGNOSIS — J0100 Acute maxillary sinusitis, unspecified: Secondary | ICD-10-CM | POA: Diagnosis not present

## 2018-02-08 MED ORDER — CIPROFLOXACIN HCL 500 MG PO TABS: 500.0000 mg | ORAL_TABLET | Freq: Two times a day (BID) | ORAL | 0 refills | Status: DC

## 2018-02-08 NOTE — Progress Notes (Signed)
Subjective:  I acted as a Education administrator for Dr. Charlett Blake. [place], [place]  Patient ID: Pamela Sanders, female    DOB: 1965/08/14, 52 y.o.   MRN: [id number]  No chief complaint on file.   HPI  Patient is in today for an acute visit for severe head congestion, mild cough and fatigue. She has not felt well for about 4 days. Has struggled with low grade fevers, fatigue, malaise, anorexia, headache, head congestion and a cough. Has been trying to use Mucinex and Tylenol prn without any significant improvement. Denies CP/palp/SOB/HA/GI or GU c/o. Taking meds as prescribed  Patient Care Team: [name], NP as PCP - General (Internal Medicine) Avon Gully, NP as Nurse Practitioner (Obstetrics and Gynecology)   Past Medical History:  Diagnosis Date  . Allergic rhinitis   . Depression (emotion)   . DJD (degenerative joint disease)    chronic neck pain  . GERD (gastroesophageal reflux disease)   . History of diverticulitis of colon   . Migraine   . Osteoarthritis     Past Surgical History:  Procedure Laterality Date  . APPENDECTOMY    . CERVICAL DISCECTOMY     Dr. Harl Bow[facility] 06/2011[facility]  . OVARIAN CYST SURGERY     abdominal surgery for ovarian cyst  . TISSUE GRAFT  02/07/15   pt reports gum graft for receeding gums--Dr Geralynn Ochs  . TMJ ARTHROSCOPY     TMJ surgery    Family History  Problem Relation Age of Onset  . Arthritis Unknown   . Breast cancer Unknown   . Coronary artery disease Maternal Grandmother   . Breast cancer Maternal Grandmother        carcinoid tumor in abd  . Hypertension Unknown   . Diverticulitis Mother   . Arthritis Mother   . Pancreatic cancer Other     Social History   Socioeconomic History  . Marital status: Married    Spouse name: Not on file  . Number of children: Not on file  . Years of education: Not on file  . Highest education level: Not on file  Occupational History  . Not on file  Social Needs  . Financial resource strain: Not on file    . Food insecurity:    Worry: Not on file    Inability: Not on file  . Transportation needs:    Medical: Not on file    Non-medical: Not on file  Tobacco Use  . Smoking status: Never Smoker  . Smokeless tobacco: Never Used  Substance and Sexual Activity  . Alcohol use: Yes    Alcohol/week: 0.0 standard drinks    Comment: 2 drinks weekly  . Drug use: No  . Sexual activity: Not on file  Lifestyle  . Physical activity:    Days per week: Not on file    Minutes per session: Not on file  . Stress: Not on file  Relationships  . Social connections:    Talks on phone: Not on file    Gets together: Not on file    Attends religious service: Not on file    Active member of club or organization: Not on file    Attends meetings of clubs or organizations: Not on file    Relationship status: Not on file  . Intimate partner violence:    Fear of current or ex partner: Not on file    Emotionally abused: Not on file    Physically abused: Not on file    Forced sexual activity: Not  on file  Other Topics Concern  . 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 ALPRAZolam (XANAX) 0.25 MG tablet TAKE 1 TABLET BY MOUTH TWO TIMES DAILY AS NEEDED FOR ANXIETY 30 tablet 0  . calcium citrate-vitamin D (CITRACAL+D) 315-200 MG-UNIT per tablet Take 1 tablet by mouth 2 (two) times daily.      . famotidine (PEPCID AC) 10 MG chewable tablet Chew 10 mg by mouth 2 (two) times daily.    Marland Kitchen gabapentin (NEURONTIN) 300 MG capsule Take 1 capsule (300 mg total) by mouth 2 (two) times daily. 60 capsule 5  . hyoscyamine (LEVSIN SL) 0.125 MG SL tablet Place 1 tablet (0.125 mg total) under the tongue every 6 (six) hours as  needed. 20 tablet 0  . loratadine (CLARITIN) 10 MG tablet Take 10 mg by mouth daily.      . metaxalone (SKELAXIN) 800 MG tablet Take 800 mg by mouth 3 (three) times daily as needed. PRN     . Multiple Vitamins-Minerals (CENTRUM PO) Take by mouth daily.      . SUMAtriptan (IMITREX) 50 MG tablet Take 100 mg by mouth every 2 (two) hours as needed.     . thiamine (VITAMIN B-1) 100 MG tablet Take 100 mg by mouth daily.    . WELLBUTRIN XL 150 MG 24 hr tablet Take 2 tablets (300 mg total) by mouth daily. 180 tablet 1  . zolpidem (AMBIEN) 5 MG tablet Take 1 tablet (5 mg total) by mouth at bedtime as needed for sleep. 30 tablet 0   No facility-administered medications prior to visit.     Allergies  Allergen Reactions  . Penicillins Hives    Review of Systems  Constitutional: Positive for fever and malaise/fatigue.  HENT: Positive for congestion.   Eyes: Negative for blurred vision.  Respiratory: Positive for cough and sputum production. Negative for shortness of breath.   Cardiovascular: Negative for chest pain, palpitations and leg swelling.  Gastrointestinal: Negative for abdominal pain, blood in stool and nausea.  Genitourinary: Negative for dysuria and frequency.  Musculoskeletal: Positive for myalgias. Negative for falls.  Skin: Negative for rash.  Neurological: Negative for dizziness, loss of consciousness and headaches.  Endo/Heme/Allergies: Negative for environmental allergies.  Psychiatric/Behavioral: Negative for depression. The patient is not nervous/anxious.        Objective:    Physical Exam  Constitutional: She is oriented to person, place, and time. No distress.  HENT:  Head: Normocephalic and atraumatic.  Right Ear: External ear normal.  Left Ear: External ear normal.  Mouth/Throat: Oropharynx is clear and moist. No oropharyngeal exudate.  Nasal mucosa boggy and erythematous.  Eyes: Pupils are equal, round, and reactive to light. Conjunctivae are normal. Right eye  exhibits no discharge. Left eye exhibits no discharge. No scleral icterus.  Neck: Normal range of motion. Neck supple. No thyromegaly present.  Cardiovascular: Normal rate, regular rhythm, normal heart sounds and intact distal pulses.  No murmur heard. Pulmonary/Chest: Effort normal and breath sounds normal. No respiratory distress. She has no wheezes. She has no rales.  Abdominal: Soft. Bowel sounds are  normal. She exhibits no distension and no mass. There is no tenderness.  Musculoskeletal: Normal range of motion. She exhibits no edema or tenderness.  Lymphadenopathy:    She has no cervical adenopathy.  Neurological: She is alert and oriented to person, place, and time. She has normal reflexes. She displays normal reflexes. No cranial nerve deficit. Coordination normal.  Skin: Skin is warm and dry. No rash noted. She is not diaphoretic.    BP 110/78 (BP Location: Left Arm, Patient Position: Sitting, Cuff Size: Normal)   Pulse 85   Temp 98.2 F (36.8 C) (Oral)   Resp 18   Wt 170 lb 3.2 oz (77.2 kg)   LMP 01/13/2018   SpO2 98%   BMI 26.66 kg/m  Wt Readings from Last 3 Encounters:  02/08/18 170 lb 3.2 oz (77.2 kg)  01/17/18 173 lb (78.5 kg)  10/19/17 175 lb (79.4 kg)   BP Readings from Last 3 Encounters:  02/08/18 110/78  01/17/18 119/82  10/19/17 100/70     Immunization History  Administered Date(s) Administered  . Influenza Split 03/24/2017  . Influenza Whole 03/11/2012  . Influenza,trivalent, recombinat, inj, PF 03/28/2013  . Influenza-Unspecified 03/29/2014, 03/25/2015, 03/13/2016  . Td 04/05/2007  . Tdap 05/21/2014  . Zoster Recombinat (Shingrix) 01/17/2018    Health Maintenance  Topic Date Due  . INFLUENZA VACCINE  01/06/2018  . PAP SMEAR  03/20/2018  . MAMMOGRAM  05/12/2018  . TETANUS/TDAP  05/21/2024  . COLONOSCOPY  08/03/2024  . HIV Screening  Completed    Lab Results  Component Value Date   WBC 6.6 11/10/2016   HGB 13.3 11/10/2016   HCT 40.5  11/10/2016   PLT 212.0 11/10/2016   GLUCOSE 83 03/05/2017   CHOL 174 11/10/2016   TRIG 101.0 11/10/2016   HDL 55.10 11/10/2016   LDLCALC 98 11/10/2016   ALT 18 03/05/2017   AST 19 03/05/2017   NA 135 03/05/2017   K 4.2 03/05/2017   CL 102 03/05/2017   CREATININE 0.86 03/05/2017   BUN 10 03/05/2017   CO2 27 03/05/2017   TSH 2.49 11/10/2016    Lab Results  Component Value Date   TSH 2.49 11/10/2016   Lab Results  Component Value Date   WBC 6.6 11/10/2016   HGB 13.3 11/10/2016   HCT 40.5 11/10/2016   MCV 87.8 11/10/2016   PLT 212.0 11/10/2016   Lab Results  Component Value Date   NA 135 03/05/2017   K 4.2 03/05/2017   CO2 27 03/05/2017   GLUCOSE 83 03/05/2017   BUN 10 03/05/2017   CREATININE 0.86 03/05/2017   BILITOT 0.3 03/05/2017   ALKPHOS 72 03/05/2017   AST 19 03/05/2017   ALT 18 03/05/2017   PROT 7.0 03/05/2017   ALBUMIN 4.2 03/05/2017   CALCIUM 9.7 03/05/2017   GFR 73.67 03/05/2017   Lab Results  Component Value Date   CHOL 174 11/10/2016   Lab Results  Component Value Date   HDL 55.10 11/10/2016   Lab Results  Component Value Date   LDLCALC 98 11/10/2016   Lab Results  Component Value Date   TRIG 101.0 11/10/2016   Lab Results  Component Value Date   CHOLHDL 3 11/10/2016   No results found for: HGBA1C       Assessment & Plan:   Problem List Items Addressed This Visit    Sinusitis    Encouraged increased rest and hydration, add probiotics, zinc such as Coldeze or Xicam. Treat fevers as needed, mucinex, elderberry  and given a prescription for Ciprofloxacin to use prn if symptoms worsen.       Relevant Medications   ciprofloxacin (CIPRO) 500 MG tablet      I am having Pamela Sanders start on ciprofloxacin. I am also having her maintain her Multiple Vitamins-Minerals (CENTRUM PO), calcium citrate-vitamin D, loratadine, SUMAtriptan, metaxalone, ALPRAZolam, famotidine, hyoscyamine, zolpidem, gabapentin, thiamine, and WELLBUTRIN  XL.  Meds ordered this encounter  Medications  . ciprofloxacin (CIPRO) 500 MG tablet    Sig: Take 1 tablet (500 mg total) by mouth 2 (two) times daily.    Dispense:  20 tablet    Refill:  0    CMA served as scribe during this visit. History, Physical and Plan performed by medical provider. Documentation and orders reviewed and attested to.  Penni Homans, MD

## 2018-02-08 NOTE — Patient Instructions (Addendum)
Encouraged increased rest and hydration, add probiotics, zinc such as Coldeze or Xicam. Treat fevers as needed. Elderberry, Vitamin C 500 to 1000 mg, Mucinex plain twice. AT night Phenylephrine or Flonase for nasal congestion. nyquil or equivalent  Sinusitis, Adult Sinusitis is soreness and inflammation of your sinuses. Sinuses are hollow spaces in the bones around your face. Your sinuses are located:  Around your eyes.  In the middle of your forehead.  Behind your nose.  In your cheekbones.  Your sinuses and nasal passages are lined with a stringy fluid (mucus). Mucus normally drains out of your sinuses. When your nasal tissues become inflamed or swollen, the mucus can become trapped or blocked so air cannot flow through your sinuses. This allows bacteria, viruses, and funguses to grow, which leads to infection. Sinusitis can develop quickly and last for 7?10 days (acute) or for more than 12 weeks (chronic). Sinusitis often develops after a cold. What are the causes? This condition is caused by anything that creates swelling in the sinuses or stops mucus from draining, including:  Allergies.  Asthma.  Bacterial or viral infection.  Abnormally shaped bones between the nasal passages.  Nasal growths that contain mucus (nasal polyps).  Narrow sinus openings.  Pollutants, such as chemicals or irritants in the air.  A foreign object stuck in the nose.  A fungal infection. This is rare.  What increases the risk? The following factors may make you more likely to develop this condition:  Having allergies or asthma.  Having had a recent cold or respiratory tract infection.  Having structural deformities or blockages in your nose or sinuses.  Having a weak immune system.  Doing a lot of swimming or diving.  Overusing nasal sprays.  Smoking.  What are the signs or symptoms? The main symptoms of this condition are pain and a feeling of pressure around the affected sinuses.  Other symptoms include:  Upper toothache.  Earache.  Headache.  Bad breath.  Decreased sense of smell and taste.  A cough that may get worse at night.  Fatigue.  Fever.  Thick drainage from your nose. The drainage is often green and it may contain pus (purulent).  Stuffy nose or congestion.  Postnasal drip. This is when extra mucus collects in the throat or back of the nose.  Swelling and warmth over the affected sinuses.  Sore throat.  Sensitivity to light.  How is this diagnosed? This condition is diagnosed based on symptoms, a medical history, and a physical exam. To find out if your condition is acute or chronic, your health care provider may:  Look in your nose for signs of nasal polyps.  Tap over the affected sinus to check for signs of infection.  View the inside of your sinuses using an imaging device that has a light attached (endoscope).  If your health care provider suspects that you have chronic sinusitis, you may also:  Be tested for allergies.  Have a sample of mucus taken from your nose (nasal culture) and checked for bacteria.  Have a mucus sample examined to see if your sinusitis is related to an allergy.  If your sinusitis does not respond to treatment and it lasts longer than 8 weeks, you may have an MRI or CT scan to check your sinuses. These scans also help to determine how severe your infection is. In rare cases, a bone biopsy may be done to rule out more serious types of fungal sinus disease. How is this treated? Treatment for sinusitis  depends on the cause and whether your condition is chronic or acute. If a virus is causing your sinusitis, your symptoms will go away on their own within 10 days. You may be given medicines to relieve your symptoms, including:  Topical nasal decongestants. They shrink swollen nasal passages and let mucus drain from your sinuses.  Antihistamines. These drugs block inflammation that is triggered by allergies.  This can help to ease swelling in your nose and sinuses.  Topical nasal corticosteroids. These are nasal sprays that ease inflammation and swelling in your nose and sinuses.  Nasal saline washes. These rinses can help to get rid of thick mucus in your nose.  If your condition is caused by bacteria, you will be given an antibiotic medicine. If your condition is caused by a fungus, you will be given an antifungal medicine. Surgery may be needed to correct underlying conditions, such as narrow nasal passages. Surgery may also be needed to remove polyps. Follow these instructions at home: Medicines  Take, use, or apply over-the-counter and prescription medicines only as told by your health care provider. These may include nasal sprays.  If you were prescribed an antibiotic medicine, take it as told by your health care provider. Do not stop taking the antibiotic even if you start to feel better. Hydrate and Humidify  Drink enough water to keep your urine clear or pale yellow. Staying hydrated will help to thin your mucus.  Use a cool mist humidifier to keep the humidity level in your home above 50%.  Inhale steam for 10-15 minutes, 3-4 times a day or as told by your health care provider. You can do this in the bathroom while a hot shower is running.  Limit your exposure to cool or dry air. Rest  Rest as much as possible.  Sleep with your head raised (elevated).  Make sure to get enough sleep each night. General instructions  Apply a warm, moist washcloth to your face 3-4 times a day or as told by your health care provider. This will help with discomfort.  Wash your hands often with soap and water to reduce your exposure to viruses and other germs. If soap and water are not available, use hand sanitizer.  Do not smoke. Avoid being around people who are smoking (secondhand smoke).  Keep all follow-up visits as told by your health care provider. This is important. Contact a health care  provider if:  You have a fever.  Your symptoms get worse.  Your symptoms do not improve within 10 days. Get help right away if:  You have a severe headache.  You have persistent vomiting.  You have pain or swelling around your face or eyes.  You have vision problems.  You develop confusion.  Your neck is stiff.  You have trouble breathing. This information is not intended to replace advice given to you by your health care provider. Make sure you discuss any questions you have with your health care provider. Document Released: 05/25/2005 Document Revised: 01/19/2016 Document Reviewed: 03/20/2015 Elsevier Interactive Patient Education  [employer].

## 2018-02-13 NOTE — Assessment & Plan Note (Signed)
Encouraged increased rest and hydration, add probiotics, zinc such as Coldeze or Xicam. Treat fevers as needed, mucinex, elderberry and given a prescription for Ciprofloxacin to use prn if symptoms worsen.

## 2018-02-18 ENCOUNTER — Encounter: Payer: Self-pay | Admitting: Family

## 2018-02-18 ENCOUNTER — Encounter: Payer: Self-pay | Admitting: Family Medicine

## 2018-02-20 ENCOUNTER — Encounter: Payer: Self-pay | Admitting: Family Medicine

## 2018-03-07 NOTE — Progress Notes (Addendum)
NEUROLOGY CONSULTATION NOTE  Pamela Sanders MRN: [id number] DOB: Apr 04, 1966  Referring provider: [name], NP Primary care provider: [name], NP  Reason for consult:  migraines  HISTORY OF PRESENT ILLNESS: Pamela Sanders is a 52 year old right-handed female with degenerative joint disease with chronic neck pain s/p ACDF C5-C6 (2011), depression, and GERD who presents for migraines and tension-type headaches.  History supplemented by prior neurologist's and referring provider's notes.  Onset:  1999 Location:  Left or right frontal/retro-orbital, chronic left sided neck pain.  She has constant pain in left jaw (history of jaw surgery for TMJ.  She uses a bite guard for bruxism.   Quality:  Stabbing/throbbing Intensity:  Moderate to severe.  She denies new headache, thunderclap headache or severe headache that wakes her from sleep. Aura:  Sometimes "bluish vision".  One time had tunnel vision in 2005. Prodrome:  no Postdrome:  no Associated symptoms:  Photophobia, phonophobia.  Sometimes tinnitus.  She denies associated nausea, vomiting, visual disturbance, autonomic symptoms or unilateral numbness or weakness. Duration:  1/2 to 1 day Frequency:  Once every 5 to 6 months Frequency of abortive medication: 2 days a week Triggers/exacerbating factors:  Emotional stress, skipped meals, sleep deprivation Relieving factors:  Laying down in dark and quiet room Activity:  Avoids  She also has tension-type headaches in the back of her head radiating into the neck as well as headaches at the bridge of her nose.  They last a day.  They occur 2 days a week.    MRI of brain with and without contrast (01/10/13):  "1.  No acute intracranial abnormality.  2.  Developmental venous anomaly within the posterior right frontal lobe."  XR Cervical Spine (04/21/17): "1.  C5-C6 ACDF with anterior plate and screw fixation and complete graft incorporation.  No motion with flexion/extension to  suggest pseudoarthrosis.  2.  Mild C4-C5 and C6-C7 degenerative disc disease.  Mild bilateral C7-T1 facet arthropathy.  3.  No acute fracture or prevertebral swelling.  4.  Multiple mandibular screws bilaterally."  Treats with caffeine Current NSAIDS:  Ibuprofen (for tension-type headache) Current analgesics:  Tylenol (for tension-type headache) Current triptans:  Sumatriptan 50mg  (not taken for years) Current ergotamine:  no Current anti-emetic:  no Current muscle relaxants:  no Current anti-anxiolytic:  Alprazolam prn Current sleep aide:  Ambien Current Antihypertensive medications:  no Current Antidepressant medications:  Wellbutrin XL 150mg  daily Current Anticonvulsant medications:  Gabapentin 300mg  twice daily Current anti-CGRP:  no Current Vitamins/Herbal/Supplements:  no Current Antihistamines/Decongestants:  Phenylephrine (for tension-type headache), Claritin Other therapy:  no  Past NSAIDS:  etodolac Past analgesics:  no Past abortive triptans:  Sumatriptan (causes nausea) Past abortive ergotamine:  no Past muscle relaxants:  Tizanidine 4mg  (helped) Past anti-emetic:  Compazine (used for sumatriptan-induced nausea) Past antihypertensive medications:  no Past antidepressant medications:  Nortriptyline (did not tolerate) Past anticonvulsant medications:  Topiramate (disoriented), Keppra Past anti-CGRP:  no Past vitamins/Herbal/Supplements:  no Past antihistamines/decongestants:  no Other past therapies:  Botox (effective but caused welts and itching), massage, caffeine  Caffeine:  1 to 2 cups coffee daily, 1 unsweetened ice tea Alcohol:  occasional Smoker:  no Diet:  Constantly drinks water Exercise:  Not routine Depression:  controlled; Anxiety:  sometimes Other pain:  Neck and shoulder pain Sleep hygiene: poor.  Usually due to the neck pain.  Anxiety (mind starts racing).  Wakes up often. Family history of headache:  Mom  Sometimes she wakes up and right arm is  numb  down to the last 3 fingers.  She tries to shake it out, it takes a 1/2 hour.  It happens every 2 months.  She also has occasional tremor in right hand.  She notices it with action.  Her great grandmother had Parkinson's disease.    PAST MEDICAL HISTORY: Past Medical History:  Diagnosis Date  . Allergic rhinitis   . Depression (emotion)   . DJD (degenerative joint disease)    chronic neck pain  . GERD (gastroesophageal reflux disease)   . History of diverticulitis of colon   . Migraine   . Osteoarthritis     PAST SURGICAL HISTORY: Past Surgical History:  Procedure Laterality Date  . APPENDECTOMY    . CERVICAL DISCECTOMY     Dr. Harl Bowie 06/2011[facility]  . OVARIAN CYST SURGERY     abdominal surgery for ovarian cyst  . TISSUE GRAFT  02/07/15   pt reports gum graft for receeding gums--Dr Geralynn Ochs  . TMJ ARTHROSCOPY     TMJ surgery    MEDICATIONS: Current Outpatient Medications on File Prior to Visit  Medication Sig Dispense Refill  . ALPRAZolam (XANAX) 0.25 MG tablet TAKE 1 TABLET BY MOUTH TWO TIMES DAILY AS NEEDED FOR ANXIETY 30 tablet 0  . calcium citrate-vitamin D (CITRACAL+D) 315-200 MG-UNIT per tablet Take 1 tablet by mouth 2 (two) times daily.      . ciprofloxacin (CIPRO) 500 MG tablet Take 1 tablet (500 mg total) by mouth 2 (two) times daily. 20 tablet 0  . famotidine (PEPCID AC) 10 MG chewable tablet Chew 10 mg by mouth 2 (two) times daily.    Marland Kitchen gabapentin (NEURONTIN) 300 MG capsule Take 1 capsule (300 mg total) by mouth 2 (two) times daily. 60 capsule 5  . hyoscyamine (LEVSIN SL) 0.125 MG SL tablet Place 1 tablet (0.125 mg total) under the tongue every 6 (six) hours as needed. 20 tablet 0  . loratadine (CLARITIN) 10 MG tablet Take 10 mg by mouth daily.      . metaxalone (SKELAXIN) 800 MG tablet Take 800 mg by mouth 3 (three) times daily as needed. PRN     . Multiple Vitamins-Minerals (CENTRUM PO) Take by mouth daily.      . SUMAtriptan (IMITREX) 50 MG tablet Take 100 mg by  mouth every 2 (two) hours as needed.     . thiamine (VITAMIN B-1) 100 MG tablet Take 100 mg by mouth daily.    . WELLBUTRIN XL 150 MG 24 hr tablet Take 2 tablets (300 mg total) by mouth daily. 180 tablet 1  . zolpidem (AMBIEN) 5 MG tablet Take 1 tablet (5 mg total) by mouth at bedtime as needed for sleep. 30 tablet 0   No current facility-administered medications on file prior to visit.     ALLERGIES: Allergies  Allergen Reactions  . Penicillins Hives    FAMILY HISTORY: Family History  Problem Relation Age of Onset  . Arthritis Unknown   . Breast cancer Unknown   . Coronary artery disease Maternal Grandmother   . Breast cancer Maternal Grandmother        carcinoid tumor in abd  . Hypertension Unknown   . Diverticulitis Mother   . Arthritis Mother   . Pancreatic cancer Other     SOCIAL HISTORY: Social History   Socioeconomic History  . Marital status: Married    Spouse name: Not on file  . Number of children: Not on file  . Years of education: Not on file  . Highest education  level: Not on file  Occupational History  . Not on file  Social Needs  . Financial resource strain: Not on file  . Food insecurity:    Worry: Not on file    Inability: Not on file  . Transportation needs:    Medical: Not on file    Non-medical: Not on file  Tobacco Use  . Smoking status: Never Smoker  . Smokeless tobacco: Never Used  Substance and Sexual Activity  . Alcohol use: Yes    Alcohol/week: 0.0 standard drinks    Comment: 2 drinks weekly  . Drug use: No  . Sexual activity: Not on file  Lifestyle  . Physical activity:    Days per week: Not on file    Minutes per session: Not on file  . Stress: Not on file  Relationships  . Social connections:    Talks on phone: Not on file    Gets together: Not on file    Attends religious service: Not on file    Active member of club or organization: Not on file    Attends meetings of clubs or organizations: Not on file    Relationship  status: Not on file  . Intimate partner violence:    Fear of current or ex partner: Not on file    Emotionally abused: Not on file    Physically abused: Not on file    Forced sexual activity: Not on file  Other Topics Concern  . 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     Regular Exercise:  3 x weekly   Caffeine Use:  2 cups coffee daily, 1 tea    REVIEW OF SYSTEMS: Constitutional: No fevers, chills, or sweats, no generalized fatigue, change in appetite Eyes: No visual changes, double vision, eye pain Ear, nose and throat: No hearing loss, ear pain, nasal congestion, sore throat Cardiovascular: No chest pain, palpitations Respiratory:  No shortness of breath at rest or with exertion, wheezes GastrointestinaI: No nausea, vomiting, diarrhea, abdominal pain, fecal incontinence Genitourinary:  No dysuria, urinary retention or frequency Musculoskeletal:  Left sided neck and shoulder pain, left jaw pain Integumentary: No rash, pruritus, skin lesions Neurological: as above Psychiatric: No depression, insomnia, anxiety Endocrine: No palpitations, fatigue, diaphoresis, mood swings, change in appetite, change in weight, increased thirst Hematologic/Lymphatic:  No purpura, petechiae. Allergic/Immunologic: no itchy/runny eyes, nasal congestion, recent allergic reactions, rashes  PHYSICAL EXAM: Blood pressure 104/78, pulse 84, height 5\' 7"  (1.702 m), weight 175 lb (79.4 kg), SpO2 98 %. General: No acute distress.  Patient appears well-groomed.  Head:  Normocephalic/atraumatic Eyes:  fundi examined but not visualized Neck: supple, left sided paraspinal tenderness, full range of motion Back: No paraspinal tenderness Heart: regular rate and rhythm Lungs: Clear to auscultation bilaterally. Vascular: No carotid bruits. Neurological  Exam: Mental status: alert and oriented to person, place, and time, recent and remote memory intact, fund of knowledge intact, attention and concentration intact, speech fluent and not dysarthric, language intact. Cranial nerves: CN I: not tested CN II: pupils equal, round and reactive to light, visual fields intact CN III, IV, VI:  full range of motion, no nystagmus, no ptosis CN V: facial sensation intact CN VII: upper and lower face symmetric CN VIII: hearing intact CN IX, X: gag intact, uvula midline  CN XI: sternocleidomastoid and trapezius muscles intact CN XII: tongue midline Bulk & Tone: normal, no fasciculations. Motor:  5/5 throughout  Sensation: temperature and vibration sensation intact.  Positive Tinel's at wrist and elbow. Deep Tendon Reflexes:  2+ throughout, toes downgoing.  Finger to nose testing:  Without dysmetria.   Very fine minimal postural tremor in right hand. Heel to shin:  Without dysmetria.  Gait:  Normal station and stride.  Romberg negative.  IMPRESSION: 1.  Migraine without aura, not intractable, without status migrainosus 2.  Episodic tension-type headache, not intractable 3.  Chronic neck pain s/p ACDF 4.  Probable carpal tunnel syndrome vs ulnar neuropathy of right upper extremity.  Since symptoms are so infrequent, I would hold off on further testing at the moment.  If they become frequent, then we will get NCV-EMG and I would have her try wearing wrist splint at night. 5.  Mild essential tremor  PLAN: 1.  Gabapentin 300mg  twice daily refilled 2.  Tizanidine 4mg  at bedtime for neck/shoulder spasms, filled 3.  Limit use of pain relievers to no more than 2 days out of week to prevent risk of rebound or medication-overuse headache. 4.  Keep headache diary 5.  Try wearing wrist splint at night.  Will schedule NCV-EMG of right upper extremity 6.  Monitor tremor 7.  Follow up in one year or as needed.  Thank you for allowing me to take part in the care  of this patient.  Metta Clines, DO  CC: [name], NP

## 2018-03-08 ENCOUNTER — Encounter: Payer: Self-pay | Admitting: Neurology

## 2018-03-08 ENCOUNTER — Ambulatory Visit: Payer: PRIVATE HEALTH INSURANCE | Admitting: Neurology

## 2018-03-08 VITALS — BP 104/78 | HR 84 | Ht 67.0 in | Wt 175.0 lb

## 2018-03-08 DIAGNOSIS — G44219 Episodic tension-type headache, not intractable: Secondary | ICD-10-CM

## 2018-03-08 DIAGNOSIS — R200 Anesthesia of skin: Secondary | ICD-10-CM | POA: Diagnosis not present

## 2018-03-08 DIAGNOSIS — R202 Paresthesia of skin: Secondary | ICD-10-CM

## 2018-03-08 DIAGNOSIS — G250 Essential tremor: Secondary | ICD-10-CM

## 2018-03-08 DIAGNOSIS — M542 Cervicalgia: Secondary | ICD-10-CM | POA: Diagnosis not present

## 2018-03-08 DIAGNOSIS — G43009 Migraine without aura, not intractable, without status migrainosus: Secondary | ICD-10-CM

## 2018-03-08 MED ORDER — TIZANIDINE HCL 4 MG PO TABS: 4.0000 mg | ORAL_TABLET | Freq: Every day | ORAL | 3 refills | Status: DC

## 2018-03-08 MED ORDER — GABAPENTIN 300 MG PO CAPS: 300.0000 mg | ORAL_CAPSULE | Freq: Two times a day (BID) | ORAL | 3 refills | Status: DC

## 2018-03-08 NOTE — Patient Instructions (Signed)
1.  Continue gabapentin 300mg  twice daily 2.  Tizanidine 4mg  at bedtime 3.  Limit use of pain relievers to no more than 2 days out of week to prevent risk of rebound or medication-overuse headache. 4.  Keep headache diary 5.  Follow up in one year or as needed.

## 2018-03-21 ENCOUNTER — Ambulatory Visit (INDEPENDENT_AMBULATORY_CARE_PROVIDER_SITE_OTHER): Payer: PRIVATE HEALTH INSURANCE | Admitting: Family

## 2018-03-21 ENCOUNTER — Encounter: Payer: Self-pay | Admitting: Family

## 2018-03-21 VITALS — BP 110/70 | HR 75 | Temp 98.4°F | Resp 16 | Ht 67.0 in | Wt 173.0 lb

## 2018-03-21 DIAGNOSIS — Z8719 Personal history of other diseases of the digestive system: Secondary | ICD-10-CM | POA: Diagnosis not present

## 2018-03-21 DIAGNOSIS — Z23 Encounter for immunization: Secondary | ICD-10-CM | POA: Diagnosis not present

## 2018-03-21 DIAGNOSIS — F419 Anxiety disorder, unspecified: Secondary | ICD-10-CM

## 2018-03-21 DIAGNOSIS — Z0000 Encounter for general adult medical examination without abnormal findings: Secondary | ICD-10-CM

## 2018-03-21 DIAGNOSIS — K140 Glossitis: Secondary | ICD-10-CM

## 2018-03-21 LAB — URINALYSIS, ROUTINE W REFLEX MICROSCOPIC
Bilirubin Urine: NEGATIVE
HGB URINE DIPSTICK: NEGATIVE
Ketones, ur: NEGATIVE
LEUKOCYTES UA: NEGATIVE
Nitrite: NEGATIVE
PH: 8 (ref 5.0–8.0)
Specific Gravity, Urine: 1.01 (ref 1.000–1.030)
TOTAL PROTEIN, URINE-UPE24: NEGATIVE
URINE GLUCOSE: NEGATIVE
Urobilinogen, UA: 0.2 (ref 0.0–1.0)

## 2018-03-21 LAB — CBC WITH DIFFERENTIAL/PLATELET
BASOS ABS: 0 10*3/uL (ref 0.0–0.1)
Basophils Relative: 0.4 % (ref 0.0–3.0)
EOS PCT: 1.7 % (ref 0.0–5.0)
Eosinophils Absolute: 0.1 10*3/uL (ref 0.0–0.7)
HEMATOCRIT: 39.7 % (ref 36.0–46.0)
Hemoglobin: 12.9 g/dL (ref 12.0–15.0)
Lymphocytes Relative: 28.2 % (ref 12.0–46.0)
Lymphs Abs: 1.5 10*3/uL (ref 0.7–4.0)
MCHC: 32.6 g/dL (ref 30.0–36.0)
MCV: 87.9 fl (ref 78.0–100.0)
MONOS PCT: 11.6 % (ref 3.0–12.0)
Monocytes Absolute: 0.6 10*3/uL (ref 0.1–1.0)
NEUTROS ABS: 3 10*3/uL (ref 1.4–7.7)
NEUTROS PCT: 58.1 % (ref 43.0–77.0)
Platelets: 221 10*3/uL (ref 150.0–400.0)
RBC: 4.52 Mil/uL (ref 3.87–5.11)
RDW: 13 % (ref 11.5–15.5)
WBC: 5.2 10*3/uL (ref 4.0–10.5)

## 2018-03-21 LAB — BASIC METABOLIC PANEL
BUN: 12 mg/dL (ref 6–23)
CO2: 30 meq/L (ref 19–32)
Calcium: 9.8 mg/dL (ref 8.4–10.5)
Chloride: 104 mEq/L (ref 96–112)
Creatinine, Ser: 0.93 mg/dL (ref 0.40–1.20)
GFR: 67.04 mL/min (ref >60.00)
GLUCOSE: 74 mg/dL (ref 70–99)
POTASSIUM: 4.6 meq/L (ref 3.5–5.1)
SODIUM: 139 meq/L (ref 135–145)

## 2018-03-21 LAB — LIPID PANEL
Cholesterol: 175 mg/dL (ref 0–200)
HDL: 54.5 mg/dL (ref >39.00)
LDL Cholesterol: 95 mg/dL (ref 0–99)
NONHDL: 120.42
Total CHOL/HDL Ratio: 3
Triglycerides: 129 mg/dL (ref 0.0–149.0)
VLDL: 25.8 mg/dL (ref 0.0–40.0)

## 2018-03-21 LAB — HEPATIC FUNCTION PANEL
ALBUMIN: 4 g/dL (ref 3.5–5.2)
ALK PHOS: 74 U/L (ref 39–117)
ALT: 15 U/L (ref 0–35)
AST: 15 U/L (ref 0–37)
Bilirubin, Direct: 0.1 mg/dL (ref 0.0–0.3)
TOTAL PROTEIN: 6.6 g/dL (ref 6.0–8.3)
Total Bilirubin: 0.3 mg/dL (ref 0.2–1.2)

## 2018-03-21 LAB — B12 AND FOLATE PANEL
Folate: 19.2 ng/mL (ref >5.9)
Vitamin B-12: 424 pg/mL (ref 211–911)

## 2018-03-21 LAB — TSH: TSH: 2.52 u[IU]/mL (ref 0.35–4.50)

## 2018-03-21 NOTE — Progress Notes (Signed)
ekg 

## 2018-03-21 NOTE — Patient Instructions (Signed)
Please complete lab work prior to leaving. Try to get 30 minutes of exercise 5 days a week.   

## 2018-03-21 NOTE — Progress Notes (Signed)
Subjective:    Patient ID: Pamela Sanders, female    DOB: 01-07-1966, 52 y.o.   MRN: [id number]  HPI  Patient presents today for complete physical.  Immunizations: 03/17/18 fl shot Diet: healthy  Exercise:  Fair, once a week, active in her job Colonoscopy:  2016- 10 year follow up Pap Smear: 03/21/15,  Mammogram: 05/12/17 Vision: 1 year ago Dental:  Up to date Wt Readings from Last 3 Encounters:  03/21/18 173 lb (78.5 kg)  03/08/18 175 lb (79.4 kg)  02/08/18 170 lb 3.2 oz (77.2 kg)      Review of Systems  Constitutional: Negative for unexpected weight change.  HENT: Positive for tinnitus. Negative for hearing loss and rhinorrhea.   Eyes: Negative for visual disturbance.  Respiratory: Negative for cough.   Cardiovascular: Negative for chest pain and leg swelling.  Gastrointestinal:       IBS constipation and diarrhea  Genitourinary: Negative for dysuria, frequency and hematuria.  Musculoskeletal:       Chronic neck pain  Neurological:       Reports migraines have been well controlled overall.     Past Medical History:  Diagnosis Date  . Allergic rhinitis   . Depression (emotion)   . DJD (degenerative joint disease)    chronic neck pain  . GERD (gastroesophageal reflux disease)   . History of diverticulitis of colon   . Migraine   . Osteoarthritis      Social History   Socioeconomic History  . Marital status: Married    Spouse name: [name]  . Number of children: 0  . Years of education: Not on file  . Highest education level: Doctorate  Occupational History  . Occupation: Probation officer: [employer]  Social Needs  . Financial resource strain: Not on file  . Food insecurity:    Worry: Not on file    Inability: Not on file  . Transportation needs:    Medical: Not on file    Non-medical: Not on file  Tobacco Use  . Smoking status: Never Smoker  . Smokeless tobacco: Never Used  Substance and Sexual Activity  .  Alcohol use: Yes    Alcohol/week: 0.0 standard drinks    Comment: 2 drinks weekly, wine  . Drug use: No  . Sexual activity: Not on file  Lifestyle  . Physical activity:    Days per week: Not on file    Minutes per session: Not on file  . Stress: Not on file  Relationships  . Social connections:    Talks on phone: Not on file    Gets together: Not on file    Attends religious service: Not on file    Active member of club or organization: Not on file    Attends meetings of clubs or organizations: Not on file    Relationship status: Not on file  . Intimate partner violence:    Fear of current or ex partner: Not on file    Emotionally abused: Not on file    Physically abused: Not on file    Forced sexual activity: Not on file  Other Topics Concern  . 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 She lives with her wife in a 2 story home. She drinks 1-2 cups of coffee a day and 1-2 glasses of tea. She exercises regularly.    Past Surgical History:  Procedure Laterality Date  . APPENDECTOMY    . CERVICAL DISCECTOMY     Dr. Harl [facility] 06/2011[facility]  . OVARIAN CYST SURGERY     abdominal surgery for ovarian cyst  . TISSUE GRAFT  02/07/15   pt reports gum graft for receeding gums--Dr Geralynn Ochs  . TMJ ARTHROSCOPY     TMJ surgery    Family History  Problem Relation Age of Onset  . Arthritis Unknown   . Breast cancer Unknown   . Coronary artery disease Maternal Grandmother   . Breast cancer Maternal Grandmother        carcinoid tumor in abd  . Hypertension Unknown   . Diverticulitis Mother   . Arthritis Mother   . Pancreatic cancer Other     Allergies  Allergen Reactions  . Penicillins Hives    Current Outpatient  Medications on File Prior to Visit  Medication Sig Dispense Refill  . ALPRAZolam (XANAX) 0.25 MG tablet TAKE 1 TABLET BY MOUTH TWO TIMES DAILY AS NEEDED FOR ANXIETY 30 tablet 0  . calcium citrate-vitamin D (CITRACAL+D) 315-200 MG-UNIT per tablet Take 1 tablet by mouth 2 (two) times daily.      . famotidine (PEPCID AC) 10 MG chewable tablet Chew 10 mg by mouth 2 (two) times daily.    Marland Kitchen gabapentin (NEURONTIN) 300 MG capsule Take 1 capsule (300 mg total) by mouth 2 (two) times daily. 180 capsule 3  . hyoscyamine (LEVSIN SL) 0.125 MG SL tablet Place 1 tablet (0.125 mg total) under the tongue every 6 (six) hours as needed. 20 tablet 0  . loratadine (CLARITIN) 10 MG tablet Take 10 mg by mouth daily.      . metaxalone (SKELAXIN) 800 MG tablet Take 800 mg by mouth 3 (three) times daily as needed. PRN     . Multiple Vitamins-Minerals (CENTRUM PO) Take by mouth daily.      . SUMAtriptan (IMITREX) 50 MG tablet Take 100 mg by mouth every 2 (two) hours as needed.     . thiamine (VITAMIN B-1) 100 MG tablet Take 100 mg by mouth daily.    Marland Kitchen tiZANidine (ZANAFLEX) 4 MG tablet Take 1 tablet (4 mg total) by mouth at bedtime. 90 tablet 3  . WELLBUTRIN XL 150 MG 24 hr tablet Take 2 tablets (300 mg total) by mouth daily. 180 tablet 1  . zolpidem (AMBIEN) 5 MG tablet Take 1 tablet (5 mg total) by mouth at bedtime as needed for sleep. 30 tablet 0   No current facility-administered medications on file prior to visit.     BP 110/70 (BP Location: Right Arm, Patient Position: Sitting, Cuff Size: Large)   Pulse 75   Temp 98.4 F (36.9 C) (Oral)   Resp 16   Ht 5\' 7"  (1.702 m)   Wt 173 lb (78.5 kg)   LMP 02/17/2018 (Approximate)   SpO2 98%   BMI 27.10 kg/m       Objective:   Physical Exam  Physical Exam  Constitutional: She is oriented to person, place, and time. She appears well-developed and well-nourished. No distress.  HENT:  Head: Normocephalic and atraumatic.  Right Ear: Tympanic membrane and ear canal  normal.  Left Ear: Tympanic membrane and  ear canal normal.  Mouth/Throat: Oropharynx is clear and moist.  Eyes: Pupils are equal, round, and reactive to light. No scleral icterus.  Neck: Normal range of motion. No thyromegaly present.  Cardiovascular: Normal rate and regular rhythm.   No murmur heard. Pulmonary/Chest: Effort normal and breath sounds normal. No respiratory distress. He has no wheezes. She has no rales. She exhibits no tenderness.  Abdominal: Soft. Bowel sounds are normal. She exhibits no distension and no mass. There is no tenderness. There is no rebound and no guarding.  Musculoskeletal: She exhibits no edema.  Lymphadenopathy:    She has no cervical adenopathy.  Neurological: She is alert and oriented to person, place, and time. She has normal patellar reflexes. She exhibits normal muscle tone. Coordination normal.  Skin: Skin is warm and dry.  Psychiatric: She has a normal mood and affect. Her behavior is normal. Judgment and thought content normal.  Breast/pelvic:deferred           Assessment & Plan:  Preventative care- pap/mammo/colo up to date.  Shingrix #2 today.  Discussed healthy diet and exercise. Obtain routine lab work.       Assessment & Plan:  EKG tracing is personally reviewed.  EKG notes NSR.  No acute changes.   Glossitis- noted recent tongue pain.  Now resolved.  Eats mainly vegetarian diet.  Anxiety/insomnia- clinically stable with prn use of xanax/ambien.  Controlled substance contract is up to date.  Obtain follow UDS.

## 2018-03-22 LAB — PAIN MGMT, PROFILE 8 W/CONF, U
6 Acetylmorphine: NEGATIVE ng/mL (ref <10)
AMPHETAMINES: NEGATIVE ng/mL (ref <500)
Alcohol Metabolites: NEGATIVE ng/mL (ref <500)
Benzodiazepines: NEGATIVE ng/mL (ref <100)
Buprenorphine, Urine: NEGATIVE ng/mL (ref <5)
Cocaine Metabolite: NEGATIVE ng/mL (ref <150)
Creatinine: 24.7 mg/dL
MARIJUANA METABOLITE: NEGATIVE ng/mL (ref <20)
MDMA: NEGATIVE ng/mL (ref <500)
OPIATES: NEGATIVE ng/mL (ref <100)
Oxidant: NEGATIVE ug/mL (ref <200)
Oxycodone: NEGATIVE ng/mL (ref <100)
PH: 6.93 (ref 4.5–9.0)

## 2018-03-24 ENCOUNTER — Encounter: Payer: Self-pay | Admitting: Family

## 2018-05-23 ENCOUNTER — Other Ambulatory Visit: Payer: Self-pay | Admitting: Family

## 2018-05-25 NOTE — Telephone Encounter (Signed)
Last alprazolam RX: 07/15/17, #30 Last OV: 03/21/18  Next OV: none scheduled UDS: 03/22/18, low risk CSC: 01/17/18 CSR: PLEASE SEE CONTROLLED SUBSTANCE REGISTRY AND ADVISE.

## 2018-06-28 LAB — HM PAP SMEAR: HM Pap smear: NEGATIVE

## 2018-06-28 LAB — HM MAMMOGRAPHY

## 2018-06-30 ENCOUNTER — Telehealth: Payer: Self-pay | Admitting: *Deleted

## 2018-06-30 ENCOUNTER — Other Ambulatory Visit: Payer: Self-pay | Admitting: Family

## 2018-06-30 NOTE — Telephone Encounter (Signed)
Received Mammogram results from [facility]; forwarded to provider/SLS 01/23

## 2018-07-01 ENCOUNTER — Other Ambulatory Visit: Payer: Self-pay | Admitting: Family

## 2018-08-09 ENCOUNTER — Ambulatory Visit: Payer: PRIVATE HEALTH INSURANCE | Admitting: Family Medicine

## 2018-08-09 ENCOUNTER — Encounter: Payer: Self-pay | Admitting: Family Medicine

## 2018-08-09 ENCOUNTER — Ambulatory Visit (INDEPENDENT_AMBULATORY_CARE_PROVIDER_SITE_OTHER): Payer: PRIVATE HEALTH INSURANCE

## 2018-08-09 VITALS — BP 120/72 | HR 85 | Temp 98.2°F | Ht 67.0 in | Wt 172.8 lb

## 2018-08-09 DIAGNOSIS — M25521 Pain in right elbow: Secondary | ICD-10-CM | POA: Diagnosis not present

## 2018-08-09 DIAGNOSIS — M7711 Lateral epicondylitis, right elbow: Secondary | ICD-10-CM | POA: Diagnosis not present

## 2018-08-09 MED ORDER — DICLOFENAC SODIUM 2 % TD SOLN: 1.0000 "application " | Freq: Two times a day (BID) | TRANSDERMAL | 3 refills | Status: DC

## 2018-08-09 NOTE — Patient Instructions (Signed)
Nice to meet you  Please try the pennsaid  Please try the exercises  Please try adjusting your work environment as much as you can  Please see me back in 3-4 weeks if no better.

## 2018-08-09 NOTE — Progress Notes (Signed)
Pamela Sanders - 53 y.o. female MRN [id number]  Date of birth: 1966/04/21  SUBJECTIVE:  Including CC & ROS.  Chief Complaint  Patient presents with  . Pain    Right elbow pain/constant pain--hurts worse when reaching or squeezing/ 2 months     Pamela Sanders is a 53 y.o. female that is  Presenting with acute worsening right elbow pain. Pain is occurring over the lateral epicondyle. 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  She is able to do her job but sometimes this exacerbates her symptoms.  Has tried counterforce brace.  Has tried anti-inflammatories and other modalities no improvement.  The pain is localized to the elbow.  Denies any radicular symptoms.  Denies any swelling or inciting event.  Feels like the pain is getting worse.  It is waking her up at night.  The pain is intermittent and moderate in severity.  The pain is sharp and throbbing.    Review of Systems  Constitutional: Negative for fever.  HENT: Negative for congestion.   Respiratory: Negative for cough.   Cardiovascular: Negative for chest pain.  Gastrointestinal: Negative for abdominal pain.  Musculoskeletal: Negative for back pain.  Skin: Negative for color change.  Neurological: Negative for weakness.  Hematological: Negative for adenopathy.  Psychiatric/Behavioral: Negative for agitation.    HISTORY: Past Medical, Surgical, Social, and Family History Reviewed & Updated per EMR.   Pertinent Historical Findings include:  Past Medical History:  Diagnosis Date  . Allergic rhinitis   . Depression (emotion)   . DJD (degenerative joint disease)    chronic neck pain  . GERD (gastroesophageal reflux disease)   . History of diverticulitis of colon   . Migraine   . Osteoarthritis     Past Surgical History:  Procedure Laterality Date  . APPENDECTOMY    . CERVICAL DISCECTOMY     Dr. Harl [facility] 06/2011[facility]  . OVARIAN CYST SURGERY     abdominal surgery for ovarian cyst  . TISSUE GRAFT  02/07/15   pt  reports gum graft for receeding gums--Dr Geralynn Ochs  . TMJ ARTHROSCOPY     TMJ surgery    Allergies  Allergen Reactions  . Penicillins Hives  . Shingrix [Zoster Vac Recomb Adjuvanted]     Fever, myalgia, local redness    Family History  Problem Relation Age of Onset  . Arthritis Unknown   . Breast cancer Unknown   . Coronary artery disease Maternal Grandmother   . Breast cancer Maternal Grandmother        carcinoid tumor in abd  . Hypertension Unknown   . Diverticulitis Mother   . Arthritis Mother   . Pancreatic cancer Other      Social History   Socioeconomic History  . Marital status: Married    Spouse name: [name]  . Number of children: 0  . Years of education: Not on file  . Highest education level: Doctorate  Occupational History  . Occupation: Probation officer: [employer]  Social Needs  . Financial resource strain: Not on file  . Food insecurity:    Worry: Not on file    Inability: Not on file  . Transportation needs:    Medical: Not on file    Non-medical: Not on file  Tobacco Use  . Smoking status: Never Smoker  . Smokeless tobacco: Never Used  Substance and Sexual Activity  . Alcohol use: Yes    Alcohol/week: 0.0 standard drinks  Comment: 2 drinks weekly, wine  . Drug use: No  . Sexual activity: Not on file  Lifestyle  . Physical activity:    Days per week: Not on file    Minutes per session: Not on file  . Stress: Not on file  Relationships  . Social connections:    Talks on phone: Not on file    Gets together: Not on file    Attends religious service: Not on file    Active member of club or organization: Not on file    Attends meetings of clubs or organizations: Not on file    Relationship status: Not on file  . Intimate partner violence:    Fear of current or ex partner: Not on file    Emotionally abused: Not on file    Physically abused: Not on file    Forced sexual activity: Not on file  Other Topics  Concern  . 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 She lives with her wife in a 2 story home. She drinks 1-2 cups of coffee a day and 1-2 glasses of tea. She exercises regularly.     PHYSICAL EXAM:  VS: BP 120/72   Pulse 85   Temp 98.2 F (36.8 C) (Oral)   Ht 5\' 7"  (1.702 m)   Wt 172 lb 12.8 oz (78.4 kg)   SpO2 98%   BMI 27.06 kg/m  Physical Exam Gen: NAD, alert, cooperative with exam, well-appearing ENT: normal lips, normal nasal mucosa,  Eye: normal EOM, normal conjunctiva and lids CV:  no edema, +2 pedal pulses   Resp: no accessory muscle use, non-labored,  Skin: no rashes, no areas of induration  Neuro: normal tone, normal sensation to touch Psych:  normal insight, alert and oriented MSK:  Right Elbow: TTP over the lateral epicondyle  Unremarkable to inspection. Range of motion full pronation, supination, flexion, extension. Strength is full to all of the above directions Stable to varus, valgus stress. Negative moving valgus stress test.. Neurovascularly intact   Limited ultrasound: right elbow:  Mild spurring at the superior aspect of the lateral epicondyle  No joint effusion  Normal appearance of radial head with dynamic testing  Common extensors with mucous change at the origin middle portion with increased hyperemia observed in long and short axis  Summary: Findings suggestive of lateral epicondylitis   Ultrasound and interpretation by Clearance Coots, MD  Aspiration/Injection Procedure Note Pamela Sanders Jan 08, 1966  Procedure: Injection Indications: right elbow pain   Procedure Details Consent: Risks of procedure as well as the alternatives and risks of each were explained to the  (patient/caregiver).  Consent for procedure obtained. Time Out: Verified patient identification, verified procedure, site/side was marked, verified correct patient position, special equipment/implants available, medications/allergies/relevent history reviewed, required imaging and test results available.  Performed.  The area was cleaned with iodine and alcohol swabs.    The right lateral epicondyle was injected using 1 cc's of 40 mg depo-medrol and 1.5 cc's of 0.25% bupivacaine with a 25 1 1/2" needle.  Ultrasound was used. Images were obtained in long views showing the injection.  A sterile dressing was applied.  Patient did tolerate procedure well.        ASSESSMENT & PLAN:   Lateral epicondylitis of right elbow Has been ongoing for more than 2 months and symptoms seem to related to the work she does in the lab.  Findings on ultrasound and clinical exam suggest that epicondylitis. -Injection today. -Has a history of migraines we will not tolerate nitro patches. -Counseled on home exercise therapy and supportive care. -Counseled on compression -Sent in Pennsaid.  Provide sample Duexis. -If no improvement will consider physical therapy or imaging

## 2018-08-09 NOTE — Assessment & Plan Note (Signed)
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  Findings on ultrasound and clinical exam suggest that epicondylitis. -Injection today. -Has a history of migraines we will not tolerate nitro patches. -Counseled on home exercise therapy and supportive care. -Counseled on compression -Sent in Pennsaid.  Provide sample Duexis. -If no improvement will consider physical therapy or imaging

## 2018-08-25 ENCOUNTER — Encounter: Payer: Self-pay | Admitting: Family

## 2018-08-26 MED ORDER — WELLBUTRIN XL 150 MG PO TB24: 300.0000 mg | ORAL_TABLET | Freq: Every day | ORAL | 1 refills | Status: DC

## 2018-08-26 MED ORDER — GABAPENTIN 300 MG PO CAPS: ORAL_CAPSULE | ORAL | 1 refills | Status: DC

## 2018-09-15 ENCOUNTER — Ambulatory Visit: Payer: PRIVATE HEALTH INSURANCE | Admitting: Family Medicine

## 2018-09-15 ENCOUNTER — Encounter: Payer: Self-pay | Admitting: Family Medicine

## 2018-09-15 ENCOUNTER — Ambulatory Visit (INDEPENDENT_AMBULATORY_CARE_PROVIDER_SITE_OTHER): Payer: PRIVATE HEALTH INSURANCE

## 2018-09-15 ENCOUNTER — Telehealth: Payer: Self-pay

## 2018-09-15 ENCOUNTER — Other Ambulatory Visit: Payer: Self-pay

## 2018-09-15 VITALS — BP 120/80 | HR 78 | Temp 98.2°F | Ht 67.0 in | Wt 178.8 lb

## 2018-09-15 DIAGNOSIS — M79645 Pain in left finger(s): Secondary | ICD-10-CM | POA: Diagnosis not present

## 2018-09-15 NOTE — Progress Notes (Signed)
Pamela Sanders - 53 y.o. female MRN [id number]  Date of birth: 1965/10/11  SUBJECTIVE:  Including CC & ROS.  Chief Complaint  Patient presents with  . Pain    left thumb hit on fridge, bruising at joint and swollen, feels thick to move    Pamela Sanders is a 53 y.o. female that is presenting with left thumb pain. She hit her thumb on the refrigerator. Having swelling and bruising along the radial aspect. Unsure how she hit her thumb. Pain is constant. Pain is throbbing. Has been applying ice. No numbness. Limited flexion due to swelling.   Review of Systems  Constitutional: Negative for fever.  HENT: Negative for congestion.   Respiratory: Negative for cough.   Cardiovascular: Negative for chest pain.  Gastrointestinal: Negative for abdominal pain.  Musculoskeletal: Negative for gait problem.  Skin: Positive for color change.  Hematological: Negative for adenopathy.    HISTORY: Past Medical, Surgical, Social, and Family History Reviewed & Updated per EMR.   Pertinent Historical Findings include:  Past Medical History:  Diagnosis Date  . Allergic rhinitis   . Depression (emotion)   . DJD (degenerative joint disease)    chronic neck pain  . GERD (gastroesophageal reflux disease)   . History of diverticulitis of colon   . Migraine   . Osteoarthritis     Past Surgical History:  Procedure Laterality Date  . APPENDECTOMY    . CERVICAL DISCECTOMY     Dr. Harl Bowie [facility]/2011[facility]  . OVARIAN CYST SURGERY     abdominal surgery for ovarian cyst  . TISSUE GRAFT  02/07/15   pt reports gum graft for receeding gums--Dr Geralynn Ochs  . TMJ ARTHROSCOPY     TMJ surgery    Allergies  Allergen Reactions  . Penicillins Hives  . Shingrix [Zoster Vac Recomb Adjuvanted]     Fever, myalgia, local redness    Family History  Problem Relation Age of Onset  . Arthritis Unknown   . Breast cancer Unknown   . Coronary artery disease Maternal Grandmother   . Breast cancer Maternal Grandmother    carcinoid tumor in abd  . Hypertension Unknown   . Diverticulitis Mother   . Arthritis Mother   . Pancreatic cancer Other      Social History   Socioeconomic History  . Marital status: Married    Spouse name: Pamela Sanders  . Number of children: 0  . Years of education: Not on file  . Highest education level: Doctorate  Occupational History  . Occupation: Probation officer: [employer]  Social Needs  . Financial resource strain: Not on file  . Food insecurity:    Worry: Not on file    Inability: Not on file  . Transportation needs:    Medical: Not on file    Non-medical: Not on file  Tobacco Use  . Smoking status: Never Smoker  . Smokeless tobacco: Never Used  Substance and Sexual Activity  . Alcohol use: Yes    Alcohol/week: 0.0 standard drinks    Comment: 2 drinks weekly, wine  . Drug use: No  . Sexual activity: Not on file  Lifestyle  . Physical activity:    Days per week: Not on file    Minutes per session: Not on file  . Stress: Not on file  Relationships  . Social connections:    Talks on phone: Not on file    Gets together: Not on file    Attends religious service:  Not on file    Active member of club or organization: Not on file    Attends meetings of clubs or organizations: Not on file    Relationship status: Not on file  . Intimate partner violence:    Fear of current or ex partner: Not on file    Emotionally abused: Not on file    Physically abused: Not on file    Forced sexual activity: Not on file  Other Topics Concern  . 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 She lives with her wife in a 2 story home. She drinks 1-2 cups of coffee a day and 1-2 glasses of tea.  She exercises regularly.     PHYSICAL EXAM:  VS: BP 120/80   Pulse 78   Temp 98.2 F (36.8 C) (Oral)   Ht 5\' 7"  (1.702 m)   Wt 178 lb 12.8 oz (81.1 kg)   SpO2 99%   BMI 28.00 kg/m  Physical Exam Gen: NAD, alert, cooperative with exam, well-appearing ENT: normal lips, normal nasal mucosa,  Eye: normal EOM, normal conjunctiva and lids CV:  no edema, +2 pedal pulses   Resp: no accessory muscle use, non-labored,  GI: no masses or tenderness, no hernia  Skin: no rashes, no areas of induration  Neuro: normal tone, normal sensation to touch Psych:  normal insight, alert and oriented MSK:  Left thumb:  Dorsal ecchymosis around the IP joint  Swelling of the entire digit  Limited flexion and extension  TTP along the radial aspect of the thumb  Neurovascularly intact   Limited ultrasound: left thumb:  Normal appearing MP and IP joint  Normal flexor and extensor tendons  No fracture appreciated  Soft tissue swelling   Summary: findings suggest small soft tissue hematoma   Ultrasound and interpretation by Clearance Coots, MD       ASSESSMENT & PLAN:   Pain of left thumb Doesn't appear to be a structural problem seems to be contusion in the soft tissue. Joint and tendons looks well.  - counseled on supportive care - compression and icing  - if no improvement consider imaging.

## 2018-09-15 NOTE — Telephone Encounter (Signed)
Questions for Screening COVID-19  Symptom onset: n/a  Travel or Contacts:no contact, no travel  During this illness, did/does the patient experience any of the following symptoms? Fever >100.68F []   Yes [x]   No []   Unknown Subjective fever (felt feverish) []   Yes [x]   No []   Unknown Chills []   Yes [x]   No []   Unknown Muscle aches (myalgia) []   Yes [x]   No []   Unknown Runny nose (rhinorrhea) []   Yes [x]   No []   Unknown Sore throat []   Yes [x]   No []   Unknown Cough (new onset or worsening of chronic cough) []   Yes [x]   No []   Unknown Shortness of breath (dyspnea) []   Yes [x]   No []   Unknown Nausea or vomiting []   Yes [x]   No []   Unknown Headache []   Yes [x]   No []   Unknown Abdominal pain  []   Yes [x]   No []   Unknown Diarrhea (?3 loose/looser than normal stools/24hr period) []   Yes [x]   No []   Unknown Other, specify:  Patient risk factors: Smoker? []   Current []   Former []   Never If female, currently pregnant? []   Yes []   No  Patient Active Problem List   Diagnosis Date Noted  . Lateral epicondylitis of right elbow 08/09/2018  . Insomnia 10/16/2015  . Hip pain 02/07/2015  . TMJ (dislocation of temporomandibular joint) 02/07/2015  . 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 Sinusitis 11/17/2013  . Routine general medical examination at a health care facility 08/09/2013  . General medical examination 12/03/2010  . LEUKOCYTOPENIA UNSPECIFIED 03/09/2008  . Depression 03/07/2008  . Migraine without aura 03/07/2008  . ALLERGIC RHINITIS 03/07/2008  . GERD 03/07/2008  . OSTEOARTHRITIS 03/07/2008  . NECK PAIN, CHRONIC 03/07/2008  . DIVERTICULITIS, HX OF 03/07/2008    Plan:  []   High risk for COVID-19 with red flags go to ED (with CP, SOB, weak/lightheaded, or fever > 101.5). Call ahead.  []   High risk for COVID-19 but stable. Inform provider and coordinate time for [facility] visit.   []   No red flags but URI signs or symptoms okay for [facility] visit.

## 2018-09-15 NOTE — Assessment & Plan Note (Signed)
Doesn't appear to be a structural problem seems to be contusion in the soft tissue. Joint and tendons looks well.  - counseled on supportive care - compression and icing  - if no improvement consider imaging.

## 2018-09-15 NOTE — Patient Instructions (Signed)
Good to see you  Please wrap the thumb  Please use ice  Please use tylenol, ibuprofen or pennsaid  Please send me a message in MyChart with any questions or updates.

## 2018-10-06 ENCOUNTER — Encounter: Payer: Self-pay | Admitting: Family

## 2018-10-10 MED ORDER — ZOLPIDEM TARTRATE 5 MG PO TABS: 5.0000 mg | ORAL_TABLET | Freq: Every evening | ORAL | 0 refills | Status: DC | PRN

## 2018-10-13 ENCOUNTER — Telehealth: Payer: PRIVATE HEALTH INSURANCE | Admitting: Family

## 2018-10-13 DIAGNOSIS — R059 Cough, unspecified: Secondary | ICD-10-CM

## 2018-10-13 DIAGNOSIS — R05 Cough: Secondary | ICD-10-CM

## 2018-10-13 MED ORDER — ALBUTEROL SULFATE HFA 108 (90 BASE) MCG/ACT IN AERS: 2.0000 | INHALATION_SPRAY | Freq: Four times a day (QID) | RESPIRATORY_TRACT | 0 refills | Status: DC | PRN

## 2018-10-13 MED ORDER — BENZONATATE 100 MG PO CAPS: 100.0000 mg | ORAL_CAPSULE | Freq: Three times a day (TID) | ORAL | 0 refills | Status: DC | PRN

## 2018-10-13 NOTE — Progress Notes (Signed)
Greater than 5 minutes, yet less than 10 minutes of time have been spent researching, coordinating, and implementing care for this patient today.  Thank you for the details you included in the comment boxes. Those details are very helpful in determining the best course of treatment for you and help [place] to provide the best care.  There are numerous infections which could cause the symptoms you are having. Your are low-risk given lack of fever and known exposure or known travel to concerning areas. See below.   E-Visit for Corona Virus Screening  Based on your current symptoms, it seems unlikely that your symptoms are related to the Coronavirus.   You have been enrolled in [place] for COVID-19. Daily you will receive a questionnaire within the [place] website. Our COVID-19 response team will be monitoring your responses daily.   Coronavirus disease 2019 (COVID-19) is a respiratory illness that can spread from person to person. The virus that causes COVID-19 is a new virus that was first identified in the country of [place] but is now found in multiple other countries and has spread to the [place].  Symptoms associated with the virus are mild to severe fever, cough, and shortness of breath. There is currently no vaccine to protect against COVID-19, and there is no specific antiviral treatment for the virus.   To be considered HIGH RISK for Coronavirus (COVID-19), you have to meet the following criteria:  . Traveled to [place], [place], [place], [place] or [place]; or in the [place] to [place], [place], [place], or [place]; and have fever, cough, and shortness of breath within the last 2 weeks of travel OR  . Been in close contact with a person diagnosed with COVID-19 within the last 2 weeks and have fever, cough, and shortness of breath  . IF YOU DO NOT MEET THESE CRITERIA, YOU ARE CONSIDERED LOW RISK FOR COVID-19.   It is vitally important that if you feel that you  have an infection such as this virus or any other virus that you stay home and away from places where you may spread it to others.  You should self-quarantine for 14 days if you have symptoms that could potentially be coronavirus and avoid contact with people age 2 and older.   You can use medication such as A prescription cough medication called Tessalon Perles 100 mg. You may take 1-2 capsules every 8 hours as needed for cough and A prescription inhaler called Albuterol MDI 90 mcg /actuation 2 puffs every 4 hours as needed for shortness of breath, wheezing, cough  You may also take acetaminophen (Tylenol) as needed for fever.   Reduce your risk of any infection by using the same precautions used for avoiding the common cold or flu:  Marland Kitchen Wash your hands often with soap and warm water for at least 20 seconds.  If soap and water are not readily available, use an alcohol-based hand sanitizer with at least 60% alcohol.  . If coughing or sneezing, cover your mouth and nose by coughing or sneezing into the elbow areas of your shirt or coat, into a tissue or into your sleeve (not your hands). . Avoid shaking hands with others and consider head nods or verbal greetings only. . Avoid touching your eyes, nose, or mouth with unwashed hands.  . Avoid close contact with people who are sick. . Avoid places or events with large numbers of people in one location, like concerts or sporting events. . Carefully  consider travel plans you have or are making. . If you are planning any travel outside or inside the [place], visit the CDC's Travelers' Health webpage for the latest health notices. . If you have some symptoms but not all symptoms, continue to monitor at home and seek medical attention if your symptoms worsen. . If you are having a medical emergency, call 911.  HOME CARE . Only take medications as instructed by your medical team. . Drink plenty of fluids and get plenty of rest. . A steam or ultrasonic  humidifier can help if you have congestion.   GET HELP RIGHT AWAY IF: . You develop worsening fever. . You become short of breath . You cough up blood. . Your symptoms become more severe MAKE SURE YOU   Understand these instructions.  Will watch your condition.  Will get help right away if you are not doing well or get worse.  Your e-visit answers were reviewed by a board certified advanced clinical practitioner to complete your personal care plan.  Depending on the condition, your plan could have included both over the counter or prescription medications.  If there is a problem please reply once you have received a response from your provider. Your safety is important to [place].  If you have drug allergies check your prescription carefully.    You can use MyChart to ask questions about today's visit, request a non-urgent call back, or ask for a work or school excuse for 24 hours related to this e-Visit. If it has been greater than 24 hours you will need to follow up with your provider, or enter a new e-Visit to address those concerns. You will get an e-mail in the next two days asking about your experience.  I hope that your e-visit has been valuable and will speed your recovery. Thank you for using e-visits.

## 2018-10-31 ENCOUNTER — Telehealth: Payer: PRIVATE HEALTH INSURANCE | Admitting: Family

## 2018-10-31 DIAGNOSIS — R197 Diarrhea, unspecified: Secondary | ICD-10-CM

## 2018-10-31 NOTE — Progress Notes (Signed)
Based on what you shared with me, I feel your condition warrants further evaluation and I recommend that you be seen for a face to face office visit.  Given your symptoms of diarrhea and abdomen pain you need to be seen face-to-face to be evaluated. Naprosyn usually does not cause diarrhea it could cause a greater chance of bleeding but usually not diarrhea. If you had taken any antibiotics sometimes this could cause it?   NOTE: If you entered your credit card information for this eVisit, you will not be charged. You may see a "hold" on your card for the $35 but that hold will drop off and you will not have a charge processed.  If you are having a true medical emergency please call 911.  If you need an urgent face to face visit, [facility] has four urgent care centers for your convenience.    PLEASE NOTE: THE [facility] LOCATIONS AND URGENT CARE CLINICS DO NOT HAVE THE TESTING FOR CORONAVIRUS COVID19 AVAILABLE.  IF YOU FEEL YOU NEED THIS TEST YOU MUST GO TO A TRIAGE LOCATION AT [facility]   [email or web address] to reserve your spot online an avoid wait times  [facility] [address] [place], [place] [address] Modified hours of operation: Monday-Friday, 12 PM to 6 PM  Saturday & Sunday 10 AM to 4 PM *Across the street from [place] (New Address!) [address] [place], [place] [address] *Just off [address], across the road from [place] hours of operation: Monday-Friday, 12 PM to 6 PM  Closed Saturday & Sunday  [facility]'s modified hours of operation will be in effect from May 1 until May 31   The following sites will take your insurance:  . [facility] [place] a Provider at this Location  [address] [place], [place] [address] . 10 am to 8 pm Monday-Friday . 12 pm to 8 pm Saturday-Sunday   .  [facility] [place] a Provider at this Location  [address] [place], [place], [place] [address] . 8 am to 8 pm Monday-Friday . 9 am to 6 pm Saturday . 11 am to 6 pm Sunday   . [facility] at [place] Get Driving Directions  [address] [place],  [address] . 8 am to 8 pm Monday-Friday . 8 am to 4 pm Saturday-Sunday   Your e-visit answers were reviewed by a board certified advanced clinical practitioner to complete your personal care plan.  Thank you for using e-Visits.

## 2018-11-08 ENCOUNTER — Telehealth: Payer: Self-pay | Admitting: Gastroenterology

## 2018-11-08 NOTE — Telephone Encounter (Signed)
Patient called me late evening through answering service with what sounds like thrombosed external hemorrhoid.  Believes she knows it is that because had similar in past. Began yesterday, getting worse.  She was unable to reduce it, so does not sound like prolapsed internal HR.  Recommended stiz baths overnight, and call our office in AM if no better. I will be in procedures all day tomorrow, so if she calls and not improved, please try to get her seen at [facility] same day or next.

## 2018-11-09 NOTE — Telephone Encounter (Signed)
Patient called back this am and is still having pain. Contacted [facility] and pt will be seen this am with Dr. Johney Maine. Per Dr. Johney Maine have pt head to their office and he will see her when she gets there. Records faxed to [facility]. Pt aware. Dr. Loletha Carrow aware.

## 2018-11-09 NOTE — Telephone Encounter (Signed)
Pt is calling about her referral to [facility]

## 2018-12-01 ENCOUNTER — Telehealth: Payer: PRIVATE HEALTH INSURANCE | Admitting: Family

## 2018-12-01 DIAGNOSIS — R05 Cough: Secondary | ICD-10-CM

## 2018-12-01 DIAGNOSIS — R059 Cough, unspecified: Secondary | ICD-10-CM

## 2018-12-01 MED ORDER — PROMETHAZINE-DM 6.25-15 MG/5ML PO SYRP: 5.0000 mL | ORAL_SOLUTION | Freq: Four times a day (QID) | ORAL | 0 refills | Status: DC | PRN

## 2018-12-01 NOTE — Progress Notes (Signed)
E-Visit for Coron.a Vi.rus Screening   Based on your current symptoms, it seems unlikely that your symptoms are related to the Coronavirus.     COVID-19 is a respiratory illness with symptoms that are similar to the flu. Symptoms are typically mild to moderate, but there have been cases of severe illness and death due to the virus. The following symptoms may appear 2-14 days after exposure: . Fever . Cough . Shortness of breath or difficulty breathing . Chills . Repeated shaking with chills . Muscle pain . Headache . Sore throat . New loss of taste or smell . Fatigue . Congestion or runny nose . Nausea or vomiting . Diarrhea  It is vitally important that if you feel that you have an infection such as this virus or any other virus that you stay home and away from places where you may spread it to others.  You should self-quarantine for 14 days if you have symptoms that could potentially be coronavirus or have been in close contact a with a person diagnosed with COVID-19 within the last 2 weeks. You should avoid contact with people age 54 and older.   You should wear a mask or cloth face covering over your nose and mouth if you must be around other people or animals, including pets (even at home). Try to stay at least 6 feet away from other people. This will protect the people around you.  You can use medication such as A prescription cough medication called Phenergan DM 6.25 mg/15 mg. You make take one teaspoon / 5 ml every 4-6 hours as needed for cough  You may also take acetaminophen (Tylenol) as needed for fever.   Reduce your risk of any infection by using the same precautions used for avoiding the common cold or flu:  Marland Kitchen Wash your hands often with soap and warm water for at least 20 seconds.  If soap and water are not readily available, use an alcohol-based hand sanitizer with at least 60% alcohol.  . If coughing or sneezing, cover your mouth and nose by coughing or sneezing into  the elbow areas of your shirt or coat, into a tissue or into your sleeve (not your hands). . Avoid shaking hands with others and consider head nods or verbal greetings only. . Avoid touching your eyes, nose, or mouth with unwashed hands.  . Avoid close contact with people who are sick. . Avoid places or events with large numbers of people in one location, like concerts or sporting events. . Carefully consider travel plans you have or are making. . If you are planning any travel outside or inside the [place], visit the CDC's Travelers' Health webpage for the latest health notices. . If you have some symptoms but not all symptoms, continue to monitor at home and seek medical attention if your symptoms worsen. . If you are having a medical emergency, call 911.  HOME CARE . Only take medications as instructed by your medical team. . Drink plenty of fluids and get plenty of rest. . A steam or ultrasonic humidifier can help if you have congestion.   GET HELP RIGHT AWAY IF YOU HAVE EMERGENCY WARNING SIGNS** FOR COVID-19. If you or someone is showing any of these signs seek emergency medical care immediately. Call 911 or proceed to your closest emergency facility if: . You develop worsening high fever. . Trouble breathing . Bluish lips or face . Persistent pain or pressure in the chest . New confusion . Inability to wake  or stay awake . You cough up blood. . Your symptoms become more severe  **This list is not all possible symptoms. Contact your medical provider for any symptoms that are sever or concerning to you.   MAKE SURE YOU   Understand these instructions.  Will watch your condition.  Will get help right away if you are not doing well or get worse.  Your e-visit answers were reviewed by a board certified advanced clinical practitioner to complete your personal care plan.  Depending on the condition, your plan could have included both over the counter or prescription medications.  If  there is a problem please reply once you have received a response from your provider.  Your safety is important to [place].  If you have drug allergies check your prescription carefully.    You can use MyChart to ask questions about today's visit, request a non-urgent call back, or ask for a work or school excuse for 24 hours related to this e-Visit. If it has been greater than 24 hours you will need to follow up with your provider, or enter a new e-Visit to address those concerns. You will get an e-mail in the next two days asking about your experience.  I hope that your e-visit has been valuable and will speed your recovery. Thank you for using e-visits.  Greater than 5 minutes, yet less than 10 minutes of time have been spent researching, coordinating, and implementing care for this patient today.  Thank you for the details you included in the comment boxes. Those details are very helpful in determining the best course of treatment for you and help [place] to provide the best care.

## 2018-12-05 ENCOUNTER — Encounter: Payer: Self-pay | Admitting: Family Medicine

## 2019-01-25 NOTE — Progress Notes (Signed)
NEUROLOGY FOLLOW UP OFFICE NOTE  Pamela Sanders [id number]  HISTORY OF PRESENT ILLNESS: Pamela Sanders is a 53 year old right-handed female with degenerative joint disease with chronic neck pain s/p ACDF C5-C6 (2011) whom I have seen for headache returns for pain and numbness in hands.  As noted, she has chronic neck pain with degenerative disease of the cervical spine s/p ACDF C5-C6 in 2011.  In October, she endorses that when she wakes up, her right arm is numb down to the last 3 fingers.  If she tries to shake it out, it takes 30 minutes to resolve.  Around January, she developed acute right elbow pain over the lateral epicondyle.  She denied radicular symptoms at the time.  She was diagnosed with epicondylitis.  She received a shot in the elbow which was not effective.  She reports still burning pain on outer portion of right arm from elbow down to wrist.  She is still having intermittent numbness in last 2 digits of right hand, especially waking up in the morning.  She also notes some burning on inner portion of left elbow.  She briefly had some numbness in fingers of left hand as well.    PAST MEDICAL HISTORY: Past Medical History:  Diagnosis Date  . Allergic rhinitis   . Depression (emotion)   . DJD (degenerative joint disease)    chronic neck pain  . GERD (gastroesophageal reflux disease)   . History of diverticulitis of colon   . Migraine   . Osteoarthritis     MEDICATIONS: Current Outpatient Medications on File Prior to Visit  Medication Sig Dispense Refill  . albuterol (VENTOLIN HFA) 108 (90 Base) MCG/ACT inhaler Inhale 2 puffs into the lungs every 6 (six) hours as needed for wheezing or shortness of breath. 1 Inhaler 0  . ALPRAZolam (XANAX) 0.25 MG tablet TAKE 1 TABLET BY MOUTH TWO TIMES DAILY AS NEEDED FOR ANXIETY 30 tablet 0  . benzonatate (TESSALON PERLES) 100 MG capsule Take 1-2 capsules (100-200 mg total) by mouth every 8 (eight) hours as needed for cough. 30 capsule 0  .  calcium citrate-vitamin D (CITRACAL+D) 315-200 MG-UNIT per tablet Take 1 tablet by mouth 2 (two) times daily.      . Diclofenac Sodium (PENNSAID) 2 % SOLN Place 1 application onto the skin 2 (two) times daily. 1 Bottle 3  . famotidine (PEPCID AC) 10 MG chewable tablet Chew 10 mg by mouth 2 (two) times daily.    Marland Kitchen gabapentin (NEURONTIN) 300 MG capsule TAKE 1 CAPSULE BY MOUTH 2 TIMES DAILY. 180 capsule 1  . hyoscyamine (LEVSIN SL) 0.125 MG SL tablet Place 1 tablet (0.125 mg total) under the tongue every 6 (six) hours as needed. 20 tablet 0  . loratadine (CLARITIN) 10 MG tablet Take 10 mg by mouth daily.      . metaxalone (SKELAXIN) 800 MG tablet Take 800 mg by mouth 3 (three) times daily as needed. PRN     . Multiple Vitamins-Minerals (CENTRUM PO) Take by mouth daily.      . promethazine-dextromethorphan (PROMETHAZINE-DM) 6.25-15 MG/5ML syrup Take 5 mLs by mouth 4 (four) times daily as needed. 118 mL 0  . SUMAtriptan (IMITREX) 50 MG tablet Take 100 mg by mouth every 2 (two) hours as needed.     . thiamine (VITAMIN B-1) 100 MG tablet Take 100 mg by mouth daily.    Marland Kitchen tiZANidine (ZANAFLEX) 4 MG tablet Take 1 tablet (4 mg total) by mouth at bedtime. 90 tablet 3  .  WELLBUTRIN XL 150 MG 24 hr tablet Take 2 tablets (300 mg total) by mouth daily. 180 tablet 1  . zolpidem (AMBIEN) 5 MG tablet Take 1 tablet (5 mg total) by mouth at bedtime as needed. for sleep 30 tablet 0   No current facility-administered medications on file prior to visit.     ALLERGIES: Allergies  Allergen Reactions  . Penicillins Hives  . Shingrix [Zoster Vac Recomb Adjuvanted]     Fever, myalgia, local redness    FAMILY HISTORY: Family History  Problem Relation Age of Onset  . Arthritis Unknown   . Breast cancer Unknown   . Coronary artery disease Maternal Grandmother   . Breast cancer Maternal Grandmother        carcinoid tumor in abd  . Hypertension Unknown   . Diverticulitis Mother   . Arthritis Mother   . Pancreatic  cancer Other    SOCIAL HISTORY: Social History   Socioeconomic History  . Marital status: Married    Spouse name: [name]  . Number of children: 0  . Years of education: Not on file  . Highest education level: Doctorate  Occupational History  . Occupation: Probation officer: [employer]  Social Needs  . Financial resource strain: Not on file  . Food insecurity    Worry: Not on file    Inability: Not on file  . Transportation needs    Medical: Not on file    Non-medical: Not on file  Tobacco Use  . Smoking status: Never Smoker  . Smokeless tobacco: Never Used  Substance and Sexual Activity  . Alcohol use: Yes    Alcohol/week: 0.0 standard drinks    Comment: 2 drinks weekly, wine  . Drug use: No  . Sexual activity: Not on file  Lifestyle  . Physical activity    Days per week: Not on file    Minutes per session: Not on file  . Stress: Not on file  Relationships  . Social Herbalist on phone: Not on file    Gets together: Not on file    Attends religious service: Not on file    Active member of club or organization: Not on file    Attends meetings of clubs or organizations: Not on file    Relationship status: Not on file  . Intimate partner violence    Fear of current or ex partner: Not on file    Emotionally abused: Not on file    Physically abused: Not on file    Forced sexual activity: Not on file  Other Topics Concern  . 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 She lives with her wife in a 2 story home. She drinks 1-2 cups of coffee a day and 1-2 glasses of tea. She exercises regularly.    REVIEW OF SYSTEMS: Constitutional: No fevers, chills, or sweats, no  generalized fatigue, change in appetite Eyes: No visual changes, double vision, eye pain Ear, nose and throat: No hearing loss, ear pain, nasal congestion, sore throat Cardiovascular: No chest pain, palpitations Respiratory:  No shortness of  breath at rest or with exertion, wheezes GastrointestinaI: No nausea, vomiting, diarrhea, abdominal pain, fecal incontinence Genitourinary:  No dysuria, urinary retention or frequency Musculoskeletal:  Chronic neck pain, right elbow pain Integumentary: No rash, pruritus, skin lesions Neurological: as above Psychiatric: No depression, insomnia, anxiety Endocrine: No palpitations, fatigue, diaphoresis, mood swings, change in appetite, change in weight, increased thirst Hematologic/Lymphatic:  No purpura, petechiae. Allergic/Immunologic: no itchy/runny eyes, nasal congestion, recent allergic reactions, rashes  PHYSICAL EXAM: Blood pressure 120/81, pulse 87, temperature 98.1 F (36.7 C), height 5\' 7"  (1.702 m), weight 174 lb 9.6 oz (79.2 kg), last menstrual period 01/07/2019, SpO2 97 %. General: No acute distress.  Patient appears well-groomed.   Head:  Normocephalic/atraumatic Eyes:  Fundi examined but not visualized Neck: supple, no paraspinal tenderness, full range of motion Heart:  Regular rate and rhythm Lungs:  Clear to auscultation bilaterally Back: No paraspinal tenderness Neurological Exam: alert and oriented to person, place, and time. Attention span and concentration intact, recent and remote memory intact, fund of knowledge intact.  Speech fluent and not dysarthric, language intact.  CN II-XII intact. Bulk and tone normal, muscle strength 5/5 throughout.  Sensation to pinprick reduced in index and thumb of right hand radiating up lateral forearm and over elbow.  Vibratory sensation reduced.  Deep tendon reflexes 2+ throughout, toes downgoing.  Finger to nose testing intact.  Gait normal, Romberg negative.  IMPRESSION: 1.  Right elbow/arm pain  2.  Intermittent numbness and tingling of right hand (less in left hand) 3.  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  The intermittent numbness in the 4th and 5th digits of right hand may be ulnar neuropathy, possibly at the elbow.  The numbness of the right index and thumb on exam may be chronic/asymptomatic findings (chronic radiculopathy vs median neuropathy).  2.  Migraines, stable  PLAN: 1.  NCV-EMG of upper extremities 2.  Increase gabapentin to 600mg  twice daily 3.  Follow up after testing.  25 minutes spent face to face with patient, over 50% spent discussing diagnosis, workup and management.   Metta Clines, DO  CC: [name], NP

## 2019-01-27 ENCOUNTER — Other Ambulatory Visit: Payer: Self-pay

## 2019-01-27 ENCOUNTER — Encounter: Payer: Self-pay | Admitting: Neurology

## 2019-01-27 ENCOUNTER — Ambulatory Visit (INDEPENDENT_AMBULATORY_CARE_PROVIDER_SITE_OTHER): Payer: PRIVATE HEALTH INSURANCE | Admitting: Neurology

## 2019-01-27 VITALS — BP 120/81 | HR 87 | Temp 98.1°F | Ht 67.0 in | Wt 174.6 lb

## 2019-01-27 DIAGNOSIS — M79601 Pain in right arm: Secondary | ICD-10-CM

## 2019-01-27 DIAGNOSIS — R200 Anesthesia of skin: Secondary | ICD-10-CM

## 2019-01-27 DIAGNOSIS — G43009 Migraine without aura, not intractable, without status migrainosus: Secondary | ICD-10-CM | POA: Diagnosis not present

## 2019-01-27 DIAGNOSIS — R202 Paresthesia of skin: Secondary | ICD-10-CM

## 2019-01-27 DIAGNOSIS — M7711 Lateral epicondylitis, right elbow: Secondary | ICD-10-CM | POA: Diagnosis not present

## 2019-01-27 MED ORDER — GABAPENTIN 300 MG PO CAPS: 600.0000 mg | ORAL_CAPSULE | Freq: Two times a day (BID) | ORAL | 5 refills | Status: DC

## 2019-01-27 NOTE — Patient Instructions (Signed)
1.  Increase gabapentin 300mg :  Take 1 capsule in morning and 2 capsules at night for one week  Then 2 capsules twice daily 2.  We will order NCV-EMG of bilateral upper extremities. 3.  Follow up after testing.

## 2019-02-04 ENCOUNTER — Encounter: Payer: Self-pay | Admitting: Family

## 2019-02-06 ENCOUNTER — Ambulatory Visit (INDEPENDENT_AMBULATORY_CARE_PROVIDER_SITE_OTHER): Payer: PRIVATE HEALTH INSURANCE | Admitting: Family

## 2019-02-06 ENCOUNTER — Other Ambulatory Visit: Payer: Self-pay

## 2019-02-06 DIAGNOSIS — F32A Depression, unspecified: Secondary | ICD-10-CM

## 2019-02-06 DIAGNOSIS — G4700 Insomnia, unspecified: Secondary | ICD-10-CM | POA: Diagnosis not present

## 2019-02-06 DIAGNOSIS — R419 Unspecified symptoms and signs involving cognitive functions and awareness: Secondary | ICD-10-CM

## 2019-02-06 DIAGNOSIS — F329 Major depressive disorder, single episode, unspecified: Secondary | ICD-10-CM | POA: Diagnosis not present

## 2019-02-06 MED ORDER — ALPRAZOLAM 0.25 MG PO TABS: ORAL_TABLET | ORAL | 0 refills | Status: DC

## 2019-02-06 MED ORDER — WELLBUTRIN XL 150 MG PO TB24: 300.0000 mg | ORAL_TABLET | Freq: Every day | ORAL | 1 refills | Status: DC

## 2019-02-06 MED ORDER — ZOLPIDEM TARTRATE 5 MG PO TABS: 5.0000 mg | ORAL_TABLET | Freq: Every evening | ORAL | 0 refills | Status: DC | PRN

## 2019-02-06 MED ORDER — GABAPENTIN 300 MG PO CAPS: 600.0000 mg | ORAL_CAPSULE | Freq: Two times a day (BID) | ORAL | 1 refills | Status: DC

## 2019-02-06 NOTE — Progress Notes (Signed)
Virtual Visit via Video Note  I connected with Pamela Sanders on 02/06/19 at 11:20 AM EDT by a video enabled telemedicine application and verified that I am speaking with the correct person using two identifiers.  Location: Patient: work Provider: home   I discussed the limitations of evaluation and management by telemedicine and the availability of in person appointments. The patient expressed understanding and agreed to proceed.  History of Present Illness:  Patient is a 53 year old female who presents today for routine follow-up.  Depression/anxiety-she is maintained on Wellbutrin 300 mg once daily.  Overall she reports that her mood has been good.  Reports that work has been stressful. Uses xanax rarely for anxiety.  She is out of Xanax however and is requesting a refill. GAD 7 : Generalized Anxiety Score 02/06/2019  Nervous, Anxious, on Edge 1  Control/stop worrying 2  Worry too much - different things 2  Trouble relaxing 1  Restless 1  Easily annoyed or irritable 1  Afraid - awful might happen 0  Total GAD 7 Score 8  Anxiety Difficulty Somewhat difficult    Insomnia- uses ambien prn.  Using about 2 x a week.    Migraines- this is being managed by neurology.  She reports that neurology recently increased her gabapentin dose and she needs this to go to her mail order pharmacy for insurance purposes.  Past Medical History:  Diagnosis Date  . Allergic rhinitis   . Depression (emotion)   . DJD (degenerative joint disease)    chronic neck pain  . GERD (gastroesophageal reflux disease)   . History of diverticulitis of colon   . Migraine   . Osteoarthritis      Social History   Socioeconomic History  . Marital status: Married    Spouse name: [name]  . Number of children: 0  . Years of education: Not on file  . Highest education level: Doctorate  Occupational History  . Occupation: Probation officer: [employer]  Social Needs  .  Financial resource strain: Not on file  . Food insecurity    Worry: Not on file    Inability: Not on file  . Transportation needs    Medical: Not on file    Non-medical: Not on file  Tobacco Use  . Smoking status: Never Smoker  . Smokeless tobacco: Never Used  Substance and Sexual Activity  . Alcohol use: Yes    Alcohol/week: 0.0 standard drinks    Comment: 2 drinks weekly, wine  . Drug use: No  . Sexual activity: Not on file  Lifestyle  . Physical activity    Days per week: Not on file    Minutes per session: Not on file  . Stress: Not on file  Relationships  . Social Herbalist on phone: Not on file    Gets together: Not on file    Attends religious service: Not on file    Active member of club or organization: Not on file    Attends meetings of clubs or organizations: Not on file    Relationship status: Not on file  . Intimate partner violence    Fear of current or ex partner: Not on file    Emotionally abused: Not on file    Physically abused: Not on file    Forced sexual activity: Not on file  Other Topics Concern  . 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 She lives with her wife in a 2 story home. She drinks 1-2 cups of coffee a day and 1-2 glasses of tea. She exercises regularly.    Past Surgical History:  Procedure Laterality Date  . APPENDECTOMY    . CERVICAL DISCECTOMY     [facility]  . HEMORROIDECTOMY    . OVARIAN CYST SURGERY     abdominal surgery for ovarian cyst  . TISSUE GRAFT  02/07/15   pt reports gum graft for receeding gums--Dr Geralynn Ochs  . TMJ ARTHROSCOPY     TMJ surgery    Family History  Problem Relation Age of Onset  . Arthritis Other   . Breast cancer Other    . Coronary artery disease Maternal Grandmother   . Breast cancer Maternal Grandmother        carcinoid tumor in abd  . Hypertension Other   . Diverticulitis Mother   . Arthritis Mother   . Pancreatic cancer Other     Allergies  Allergen Reactions  . Penicillins Hives  . Shingrix [Zoster Vac Recomb Adjuvanted]     Fever, myalgia, local redness    Current Outpatient Medications on File Prior to Visit  Medication Sig Dispense Refill  . b complex vitamins capsule Take by mouth daily.    . calcium citrate-vitamin D (CITRACAL+D) 315-200 MG-UNIT per tablet Take 1 tablet by mouth 2 (two) times daily.      . Diclofenac Sodium (PENNSAID) 2 % SOLN Place 1 application onto the skin 2 (two) times daily. 1 Bottle 3  . diphenhydrAMINE (BENADRYL) 25 MG tablet Take by mouth.    . famotidine (PEPCID AC) 10 MG chewable tablet Chew 10 mg by mouth 2 (two) times daily.    . hyoscyamine (LEVSIN SL) 0.125 MG SL tablet Place 1 tablet (0.125 mg total) under the tongue every 6 (six) hours as needed. 20 tablet 0  . loratadine (CLARITIN) 10 MG tablet Take 10 mg by mouth daily.      . metaxalone (SKELAXIN) 800 MG tablet Take 800 mg by mouth 3 (three) times daily as needed. PRN     . Multiple Vitamins-Minerals (CENTRUM PO) Take by mouth daily.      . naproxen sodium (ANAPROX) 550 MG tablet as needed.    . SUMAtriptan (IMITREX) 50 MG tablet Take 100 mg by mouth every 2 (two) hours as needed.     . thiamine (VITAMIN B-1) 100 MG tablet Take 100 mg by mouth daily.    Marland Kitchen tiZANidine (ZANAFLEX) 4 MG tablet Take 1 tablet (4 mg total) by mouth at bedtime. 90 tablet 3   No current facility-administered medications on file prior to visit.     LMP 01/07/2019    Observations/Objective:   Gen: Awake, alert, no acute distress Resp: Breathing is even and non-labored Psych: calm/pleasant demeanor Neuro: Alert and Oriented x 3, + facial symmetry, speech is clear.   Assessment and Plan:  Depression-this is stable  on current dose of Wellbutrin.  Continue same.  Anxiety-stable with rare use of alprazolam.  Refill sent  Insomnia-stable with rare use of Ambien.  Continue same.  Refill sent to her pharmacy.  Patient  request 90-day refills of Wellbutrin and gabapentin have been sent to her mail order pharmacy they were called in verbally to the pharmacist.  Follow Up Instructions:    I discussed the assessment and treatment plan with the patient. The patient was provided an opportunity to ask questions and all were answered. The patient agreed with the plan and demonstrated an understanding of the instructions.   The patient was advised to call back or seek an in-person evaluation if the symptoms worsen or if the condition fails to improve as anticipated.  Nance Pear, NP

## 2019-02-07 NOTE — Telephone Encounter (Signed)
~  Dr. Tomi Likens  I am getting a hard stop on refilling this medication. Its saying that the medication is order under a future encounter and I can not change it.  Gabapentin  Its under Melissa O'Sullivans name right now. Not sure if she has it held up because its not signed or she just has it open under a encounter.  Any suggestion?

## 2019-03-07 ENCOUNTER — Ambulatory Visit (INDEPENDENT_AMBULATORY_CARE_PROVIDER_SITE_OTHER): Payer: PRIVATE HEALTH INSURANCE | Admitting: Neurology

## 2019-03-07 ENCOUNTER — Other Ambulatory Visit: Payer: Self-pay

## 2019-03-07 DIAGNOSIS — M79601 Pain in right arm: Secondary | ICD-10-CM | POA: Diagnosis not present

## 2019-03-07 DIAGNOSIS — G5601 Carpal tunnel syndrome, right upper limb: Secondary | ICD-10-CM

## 2019-03-07 DIAGNOSIS — R200 Anesthesia of skin: Secondary | ICD-10-CM

## 2019-03-07 DIAGNOSIS — M7711 Lateral epicondylitis, right elbow: Secondary | ICD-10-CM

## 2019-03-07 NOTE — Procedures (Signed)
[facility]  [place], [place]  [address], [place] [address] Tel: [phone] Fax:  [phone] Test Date:  03/07/2019  Patient: Pamela Sanders DOB: 1965/12/19 Physician: Pamela Amber, DO  Sex: Female Height: 5\' 7"  Ref Phys: Pamela Clines, DO  ID#: [id number] Temp: 34.0C Technician:    Patient Complaints: This is a 53 year old female referred for evaluation of bilateral hand tingling, worse on the right.  NCV & EMG Findings: Extensive electrodiagnostic testing of the right upper extremity and additional studies of the left shows:  1. Right mixed palmar sensory responses show prolonged latency.  Left mixed palmar, bilateral median, and bilateral ulnar sensory responses are within normal limits. 2. Bilateral median and ulnar motor responses are within normal limits.  Of note, there is evidence of a right Martin-Gruber anastomosis. 3. There is no evidence of active or chronic motor axonal loss changes affecting any of the tested muscles.  Motor unit configuration and recruitment pattern is within normal limits.  Impression: 1. Right median neuropathy at or distal to the wrist (mild), consistent with a clinical diagnosis of carpal tunnel syndrome.   2. There is no evidence of left carpal tunnel syndrome or a cervical radiculopathy affecting either upper extremity. 3. Incidentally, there is a right Martin-Gruber anastomosis, a normal anatomic variant.   ___________________________ Pamela Amber, DO    Nerve Conduction Studies Anti Sensory Summary Table   Site NR Peak (ms) Norm Peak (ms) P-T Amp (V) Norm P-T Amp  Left Median Anti Sensory (2nd Digit)  34C  Wrist    2.8 <3.6 36.7 >15  Right Median Anti Sensory (2nd Digit)  34C  Wrist    3.6 <3.6 19.1 >15  Left Ulnar Anti Sensory (5th Digit)  34C  Wrist    2.4 <3.1 29.1 >10  Right Ulnar Anti Sensory (5th Digit)  34C  Wrist    2.1 <3.1 32.0 >10   Motor Summary Table   Site NR Onset (ms) Norm Onset (ms) O-P Amp  (mV) Norm O-P Amp Site1 Site2 Delta-0 (ms) Dist (cm) Vel (m/s) Norm Vel (m/s)  Left Median Motor (Abd Poll Brev)  34C  Wrist    2.9 <4.0 8.7 >6 Elbow Wrist 4.7 28.0 60 >50  Elbow    7.6  8.7         Right Median Motor (Abd Poll Brev)  34C  Wrist    3.4 <4.0 7.0 >6 Elbow Wrist 5.3 29.0 55 >50  Elbow    8.7  6.3  Ulnar-wrist crossover Elbow 5.1 0.0    Ulnar-wrist crossover    3.6  3.9         Left Ulnar Motor (Abd Dig Minimi)  34C  Wrist    1.6 <3.1 9.2 >7 B Elbow Wrist 3.6 23.0 64 >50  B Elbow    5.2  8.9  A Elbow B Elbow 1.8 10.0 56 >50  A Elbow    7.0  8.2         Right Ulnar Motor (Abd Dig Minimi)  34C  Wrist    2.1 <3.1 11.1 >7 B Elbow Wrist 3.4 21.5 63 >50  B Elbow    5.5  10.9  A Elbow B Elbow 1.7 10.0 59 >50  A Elbow    7.2  10.8          Comparison Summary Table   Site NR Peak (ms) Norm Peak (ms) P-T Amp (V) Site1 Site2 Delta-P (ms) Norm Delta (ms)  Left Median/Ulnar Palm Comparison (  Wrist - 8cm)  34C  Median Palm    1.6 <2.2 54.5 Median Palm Ulnar Palm 0.3   Ulnar Palm    1.3 <2.2 16.4      Right Median/Ulnar Palm Comparison (Wrist - 8cm)  34C  Median Palm    2.8 <2.2 24.9 Median Palm Ulnar Palm 1.3   Ulnar Palm    1.5 <2.2 15.4       EMG   Side Muscle Ins Act Fibs Psw Fasc Number Recrt Dur Dur. Amp Amp. Poly Poly. Comment  Right 1stDorInt Nml Nml Nml Nml Nml Nml Nml Nml Nml Nml Nml Nml N/A  Right Abd Poll Brev Nml Nml Nml Nml Nml Nml Nml Nml Nml Nml Nml Nml N/A  Right PronatorTeres Nml Nml Nml Nml Nml Nml Nml Nml Nml Nml Nml Nml N/A  Right Biceps Nml Nml Nml Nml Nml Nml Nml Nml Nml Nml Nml Nml N/A  Right Triceps Nml Nml Nml Nml Nml Nml Nml Nml Nml Nml Nml Nml N/A  Right Deltoid Nml Nml Nml Nml Nml Nml Nml Nml Nml Nml Nml Nml N/A  Left 1stDorInt Nml Nml Nml Nml Nml Nml Nml Nml Nml Nml Nml Nml N/A  Left PronatorTeres Nml Nml Nml Nml Nml Nml Nml Nml Nml Nml Nml Nml N/A  Left Biceps Nml Nml Nml Nml Nml Nml Nml Nml Nml Nml Nml Nml N/A  Left Triceps Nml Nml Nml Nml  Nml Nml Nml Nml Nml Nml Nml Nml N/A  Left Deltoid Nml Nml Nml Nml Nml Nml Nml Nml Nml Nml Nml Nml N/A      Waveforms:

## 2019-03-08 ENCOUNTER — Encounter: Payer: Self-pay | Admitting: Neurology

## 2019-03-08 NOTE — Progress Notes (Signed)
Virtual Visit via Video Note The purpose of this virtual visit is to provide medical care while limiting exposure to the novel coronavirus.    Consent was obtained for video visit:  Yes.   Answered questions that patient had about telehealth interaction:  Yes.   I discussed the limitations, risks, security and privacy concerns of performing an evaluation and management service by telemedicine. I also discussed with the patient that there may be a patient responsible charge related to this service. The patient expressed understanding and agreed to proceed.  Pt location: Home Physician Location: Home Name of referring provider:  [name], NP I connected with Pamela Sanders at patients initiation/request on 03/09/2019 at  9:50 AM EDT by video enabled telemedicine application and verified that I am speaking with the correct person using two identifiers. Pt MRN:  [id number] Pt DOB:  1965/06/21 Video Participants:  Pamela Sanders   History of Present Illness:  Pamela Sanders is a 53 year old right-handed female with degenerative joint disease with chronic neck pain s/p ACDF C5-C6 (2011) who follows up for pain and numbness in hands.  UPDATE: For right arm pain, gabapentin was increased to 600mg  twice daily.  It has decreased pain by a little less than 50%.  It makes her drowsy.  She does find that she is dropping things from her right hand.    NCV-EMG of bilateral upper extremities from 03/07/2019 showed findings consistent with mild carpal tunnel syndrome but no evidence of ulnar neuropathy or cervical radiculopathy.  Current NSAIDS:  Ibuprofen (for tension-type headache) Current analgesics:  Tylenol (for tension-type headache) Current triptans:  Sumatriptan 50mg  (not taken for years) Current ergotamine:  no Current anti-emetic:  no Current muscle relaxants:  no Current anti-anxiolytic:  Alprazolam prn Current sleep aide:  Ambien Current Antihypertensive medications:  no Current  Antidepressant medications:  Wellbutrin XL 150mg  daily Current Anticonvulsant medications:  Gabapentin 600mg  twice daily Current anti-CGRP:  no Current Vitamins/Herbal/Supplements:  no Current Antihistamines/Decongestants:  Phenylephrine (for tension-type headache), Claritin Other therapy:  no  HISTORY: She has chronic neck pain with degenerative disease of the cervical spine s/p ACDF C5-C6 in 2011.  In October, she endorses that when she wakes up, her right arm is numb down to the last 3 fingers.  If she tries to shake it out, it takes 30 minutes to resolve.  Around January, she developed acute right elbow pain over the lateral epicondyle.  She denied radicular symptoms at the time.  She was diagnosed with epicondylitis.  She received a shot in the elbow which was not effective.  She reports still burning pain on outer portion of right arm from elbow down to wrist.  She is still having intermittent numbness in last 2 digits of right hand, especially waking up in the morning.  She also notes some burning on inner portion of left elbow.  She briefly had some numbness in fingers of left hand as well.    Migraines: Onset:  1999 Location:  Left or right frontal/retro-orbital, chronic left sided neck pain.  She has constant pain in left jaw (history of jaw surgery for TMJ.  She uses a bite guard for bruxism.   Quality:  Stabbing/throbbing Intensity:  Moderate to severe.  She denies new headache, thunderclap headache or severe headache that wakes her from sleep. Aura:  Sometimes "bluish vision".  One time had tunnel vision in 2005. Prodrome:  no Postdrome:  no Associated symptoms:  Photophobia, phonophobia.  Sometimes tinnitus.  She denies associated  nausea, vomiting, visual disturbance, autonomic symptoms or unilateral numbness or weakness. Duration:  1/2 to 1 day Frequency:  Once every 5 to 6 months Frequency of abortive medication: 2 days a week Triggers/exacerbating factors:  Emotional stress,  skipped meals, sleep deprivation Relieving factors:  Laying down in dark and quiet room Activity:  Avoids  She also has tension-type headaches in the back of her head radiating into the neck as well as headaches at the bridge of her nose.  They last a day.  They occur 2 days a week.    MRI of brain with and without contrast (01/10/13):  "1.  No acute intracranial abnormality.  2.  Developmental venous anomaly within the posterior right frontal lobe."  XR Cervical Spine (04/21/17): "1.  C5-C6 ACDF with anterior plate and screw fixation and complete graft incorporation.  No motion with flexion/extension to suggest pseudoarthrosis.  2.  Mild C4-C5 and C6-C7 degenerative disc disease.  Mild bilateral C7-T1 facet arthropathy.  3.  No acute fracture or prevertebral swelling.  4.  Multiple mandibular screws bilaterally."  Past NSAIDS:  etodolac Past analgesics:  no Past abortive triptans:  Sumatriptan (causes nausea) Past abortive ergotamine:  no Past muscle relaxants:  Tizanidine 4mg  (helped) Past anti-emetic:  Compazine (used for sumatriptan-induced nausea) Past antihypertensive medications:  no Past antidepressant medications:  Nortriptyline (did not tolerate) Past anticonvulsant medications:  Topiramate (disoriented), Keppra Past anti-CGRP:  no Past vitamins/Herbal/Supplements:  no Past antihistamines/decongestants:  no Other past therapies:  Botox (effective but caused welts and itching), massage, caffeine  Past Medical History: Past Medical History:  Diagnosis Date  . Allergic rhinitis   . Depression (emotion)   . DJD (degenerative joint disease)    chronic neck pain  . GERD (gastroesophageal reflux disease)   . History of diverticulitis of colon   . Migraine   . Osteoarthritis     Medications: Outpatient Encounter Medications as of 03/09/2019  Medication Sig Note  . ALPRAZolam (XANAX) 0.25 MG tablet TAKE 1 TABLET BY MOUTH TWO TIMES DAILY AS NEEDED FOR ANXIETY   . b complex  vitamins capsule Take by mouth daily.   . calcium citrate-vitamin D (CITRACAL+D) 315-200 MG-UNIT per tablet Take 1 tablet by mouth 2 (two) times daily.     . Diclofenac Sodium (PENNSAID) 2 % SOLN Place 1 application onto the skin 2 (two) times daily.   . diphenhydrAMINE (BENADRYL) 25 MG tablet Take by mouth.   . famotidine (PEPCID AC) 10 MG chewable tablet Chew 10 mg by mouth 2 (two) times daily.   Marland Kitchen gabapentin (NEURONTIN) 300 MG capsule Take 2 capsules (600 mg total) by mouth 2 (two) times daily.   . hyoscyamine (LEVSIN SL) 0.125 MG SL tablet Place 1 tablet (0.125 mg total) under the tongue every 6 (six) hours as needed.   . loratadine (CLARITIN) 10 MG tablet Take 10 mg by mouth daily.     . metaxalone (SKELAXIN) 800 MG tablet Take 800 mg by mouth 3 (three) times daily as needed. PRN    . Multiple Vitamins-Minerals (CENTRUM PO) Take by mouth daily.     . naproxen sodium (ANAPROX) 550 MG tablet as needed.   . SUMAtriptan (IMITREX) 50 MG tablet Take 100 mg by mouth every 2 (two) hours as needed.  01/27/2019: Not taken in years  . thiamine (VITAMIN B-1) 100 MG tablet Take 100 mg by mouth daily. 01/27/2019: Taking B complex vitamin instead  . tiZANidine (ZANAFLEX) 4 MG tablet Take 1 tablet (4 mg total) by  mouth at bedtime.   . WELLBUTRIN XL 150 MG 24 hr tablet Take 2 tablets (300 mg total) by mouth daily.   Marland Kitchen zolpidem (AMBIEN) 5 MG tablet Take 1 tablet (5 mg total) by mouth at bedtime as needed. for sleep    No facility-administered encounter medications on file as of 03/09/2019.     Allergies: Allergies  Allergen Reactions  . Penicillins Hives  . Shingrix [Zoster Vac Recomb Adjuvanted]     Fever, myalgia, local redness    Family History: Family History  Problem Relation Age of Onset  . Arthritis Other   . Breast cancer Other   . Coronary artery disease Maternal Grandmother   . Breast cancer Maternal Grandmother        carcinoid tumor in abd  . Hypertension Other   . Diverticulitis  Mother   . Arthritis Mother   . Pancreatic cancer Other     Social History: Social History   Socioeconomic History  . Marital status: Married    Spouse name: [name]  . Number of children: 0  . Years of education: Not on file  . Highest education level: Doctorate  Occupational History  . Occupation: Probation officer: [employer]  Social Needs  . Financial resource strain: Not on file  . Food insecurity    Worry: Not on file    Inability: Not on file  . Transportation needs    Medical: Not on file    Non-medical: Not on file  Tobacco Use  . Smoking status: Never Smoker  . Smokeless tobacco: Never Used  Substance and Sexual Activity  . Alcohol use: Yes    Alcohol/week: 0.0 standard drinks    Comment: 2 drinks weekly, wine  . Drug use: No  . Sexual activity: Not on file  Lifestyle  . Physical activity    Days per week: Not on file    Minutes per session: Not on file  . Stress: Not on file  Relationships  . Social Herbalist on phone: Not on file    Gets together: Not on file    Attends religious service: Not on file    Active member of club or organization: Not on file    Attends meetings of clubs or organizations: Not on file    Relationship status: Not on file  . Intimate partner violence    Fear of current or ex partner: Not on file    Emotionally abused: Not on file    Physically abused: Not on file    Forced sexual activity: Not on file  Other Topics Concern  . 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 She lives with her wife in a 2 story home. She drinks 1-2 cups of coffee a day and 1-2 glasses of tea. She exercises regularly.    Observations/Objective:    Height 5\' 7"  (1.702 m), weight 170 lb (77.1 kg). No acute distress.  Alert and oriented.  Speech fluent and not dysarthric.  Language intact.  Eyes orthophoric on primary gaze.  Face symmetric.  Assessment and Plan:  1.  Right elbow/arm pain with numbness in last two digits. 2.  Degenerative disc disease of cervical spine s/p ACDF with chronic neck pain. 3.  Lateral epicondylitis  NCV-EMG negative for ulnar neuropathy.  Symptoms may very well be secondary to epicondylitis (elbow/forearm pain, hand feels weak).  However, given her chronic neck pain with significant history of degenerative cervical disc disease, and the sensory symptoms in the fingers, I would like to make sure that there isn't any obvious C8 radiculopathy.  She has failed optimal therapy with gabapentin.    1.  MRI of cervical spine without contrast 2.  Gabapentin 600mg  twice daily 3.  Follow up in 4 months.  Follow Up Instructions:    -I discussed the assessment and treatment plan with the patient. The patient was provided an opportunity to ask questions and all were answered. The patient agreed with the plan and demonstrated an understanding of the instructions.   The patient was advised to call back or seek an in-person evaluation if the symptoms worsen or if the condition fails to improve as anticipated.    Total Time spent in visit with the patient was:  15 minutes  Dudley Major, DO

## 2019-03-09 ENCOUNTER — Other Ambulatory Visit: Payer: Self-pay | Admitting: *Deleted

## 2019-03-09 ENCOUNTER — Other Ambulatory Visit: Payer: Self-pay

## 2019-03-09 ENCOUNTER — Telehealth (INDEPENDENT_AMBULATORY_CARE_PROVIDER_SITE_OTHER): Payer: PRIVATE HEALTH INSURANCE | Admitting: Neurology

## 2019-03-09 ENCOUNTER — Encounter: Payer: Self-pay | Admitting: Neurology

## 2019-03-09 VITALS — Ht 67.0 in | Wt 170.0 lb

## 2019-03-09 DIAGNOSIS — R29898 Other symptoms and signs involving the musculoskeletal system: Secondary | ICD-10-CM

## 2019-03-09 DIAGNOSIS — M542 Cervicalgia: Secondary | ICD-10-CM

## 2019-03-09 DIAGNOSIS — M79601 Pain in right arm: Secondary | ICD-10-CM

## 2019-03-09 DIAGNOSIS — R200 Anesthesia of skin: Secondary | ICD-10-CM

## 2019-03-27 ENCOUNTER — Encounter: Payer: Self-pay | Admitting: Family

## 2019-03-30 MED ORDER — BETAMETHASONE DIPROPIONATE AUG 0.05 % EX OINT: TOPICAL_OINTMENT | Freq: Two times a day (BID) | CUTANEOUS | 0 refills | Status: DC

## 2019-04-06 ENCOUNTER — Other Ambulatory Visit: Payer: PRIVATE HEALTH INSURANCE

## 2019-04-07 ENCOUNTER — Other Ambulatory Visit: Payer: Self-pay

## 2019-04-07 ENCOUNTER — Ambulatory Visit
Admission: RE | Admit: 2019-04-07 | Discharge: 2019-04-07 | Disposition: A | Discharge status: Home / Self Care | Payer: PRIVATE HEALTH INSURANCE | Source: Ambulatory Visit | Attending: Neurology | Admitting: Neurology

## 2019-04-07 DIAGNOSIS — M542 Cervicalgia: Secondary | ICD-10-CM

## 2019-04-07 MED ORDER — GADOBENATE DIMEGLUMINE 529 MG/ML IV SOLN
15.0000 mL | Freq: Once | INTRAVENOUS | Status: AC | PRN
  Administered 2019-04-07: 15 mL via INTRAVENOUS

## 2019-04-13 ENCOUNTER — Telehealth: Payer: Self-pay

## 2019-04-13 NOTE — Telephone Encounter (Signed)
-----   Message from Pieter Partridge, DO sent at 04/13/2019 12:36 PM EST ----- MRI of cervical spine shows mild arthritic changes but no nerve involvement to explain right arm numbness

## 2019-04-13 NOTE — Telephone Encounter (Signed)
Called spoke with patient she was informed of results and understand.

## 2019-04-30 ENCOUNTER — Other Ambulatory Visit: Payer: Self-pay | Admitting: Family

## 2019-06-02 IMAGING — US US ABDOMEN COMPLETE
1 series · 14 of 25 positions shown · non-contrast
Comparison: 04/01/2012 CT and 12/05/2010 ultrasound

CLINICAL DATA: 50-year-old female with chronic epigastric and
abdominal pain.

EXAM:
ABDOMEN ULTRASOUND COMPLETE

[Series 1: us abdomen complete · 0.18mm/px · 14 of 66 slices shown]
[im 1/66]
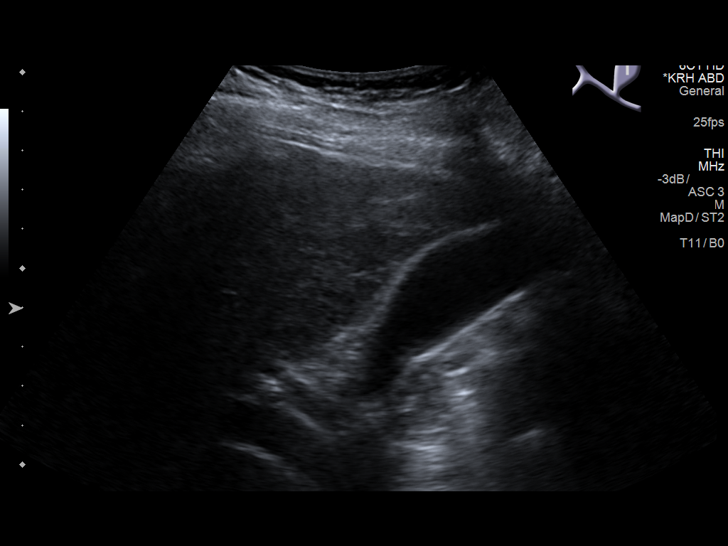
[im 6/66]
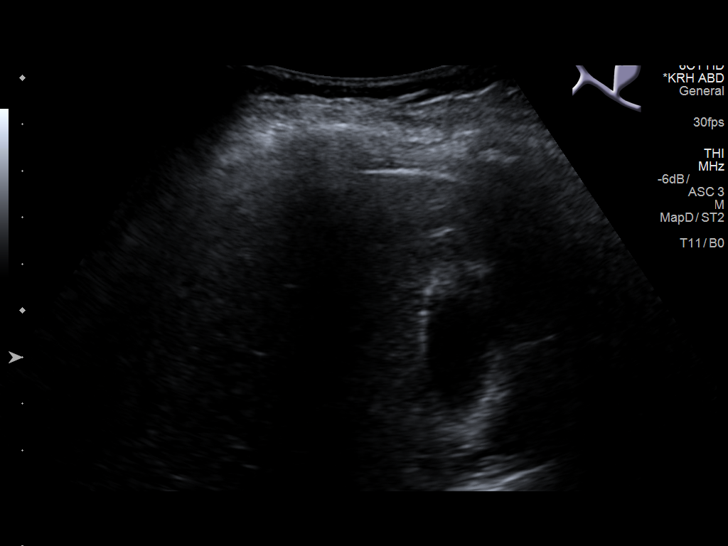
[im 11/66]
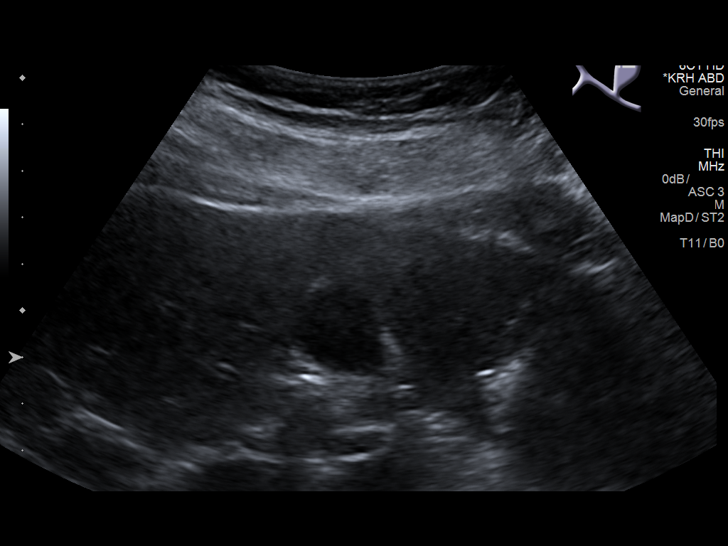
[im 17/66]
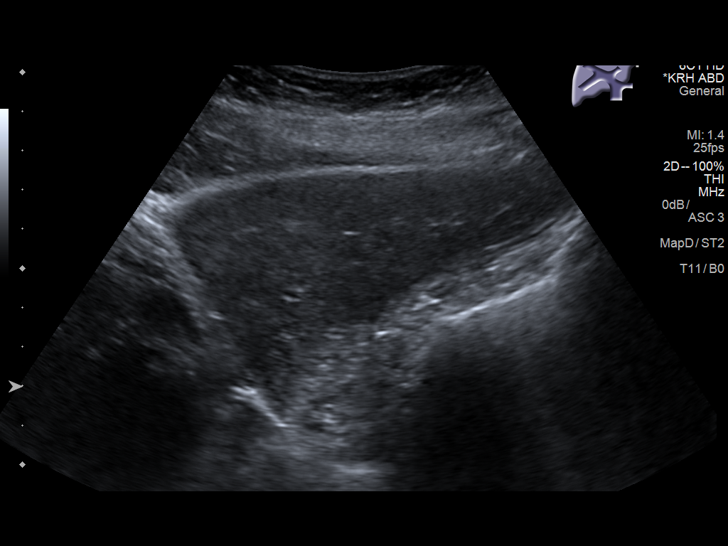
[im 22/66]
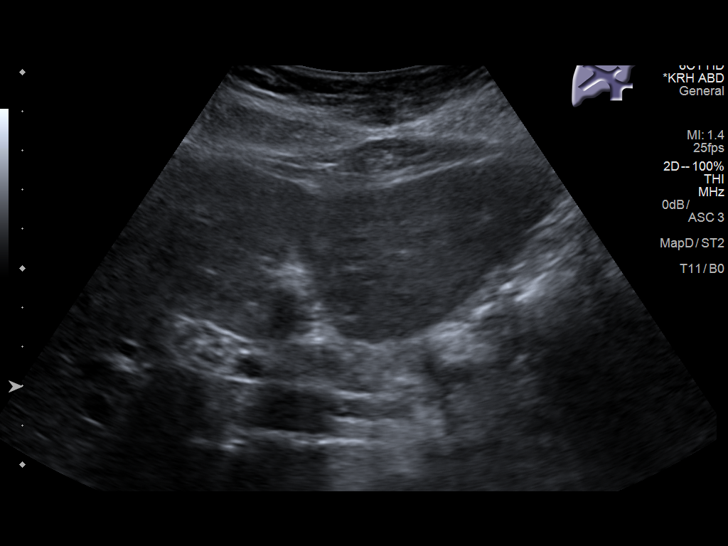
[im 25/66]
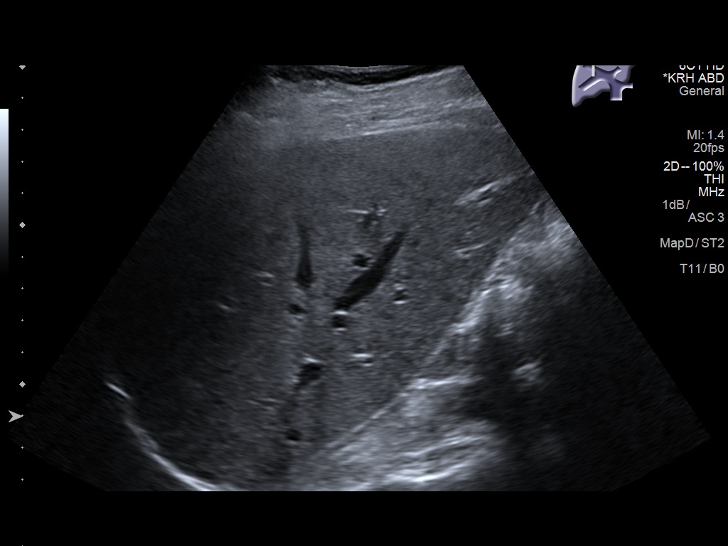
[im 30/66]
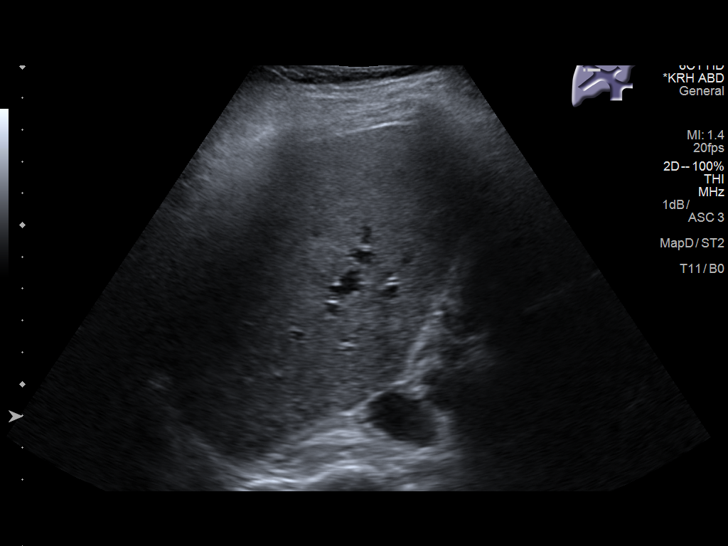
[im 36/66]
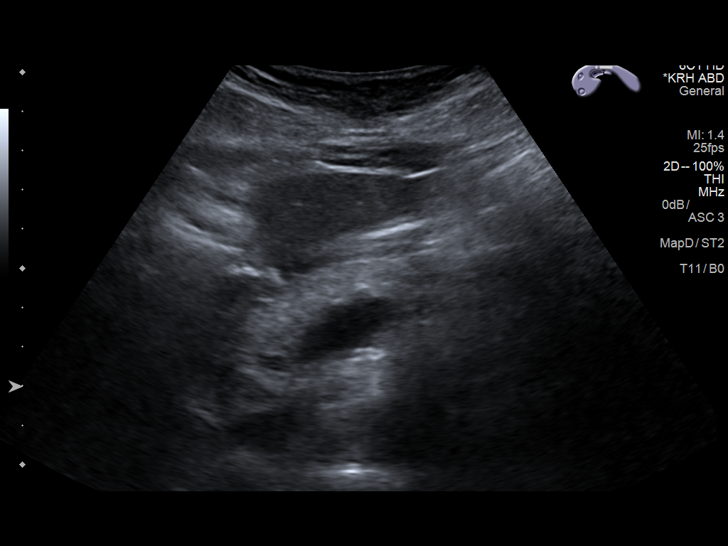
[im 41/66]
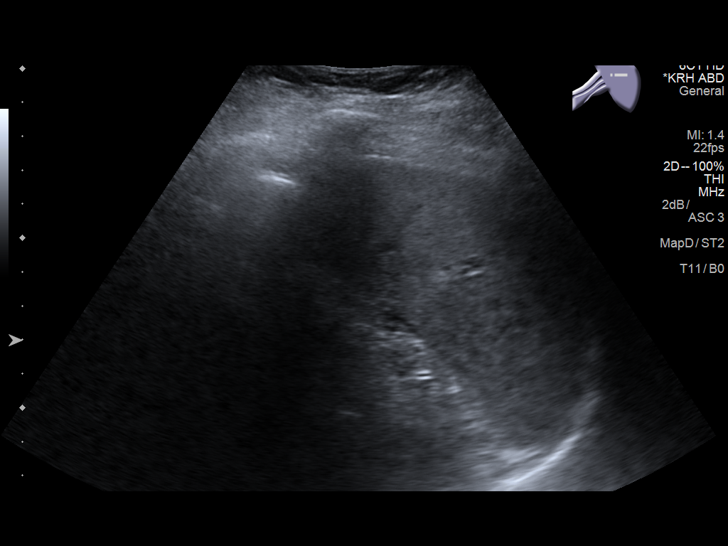
[im 44/66]
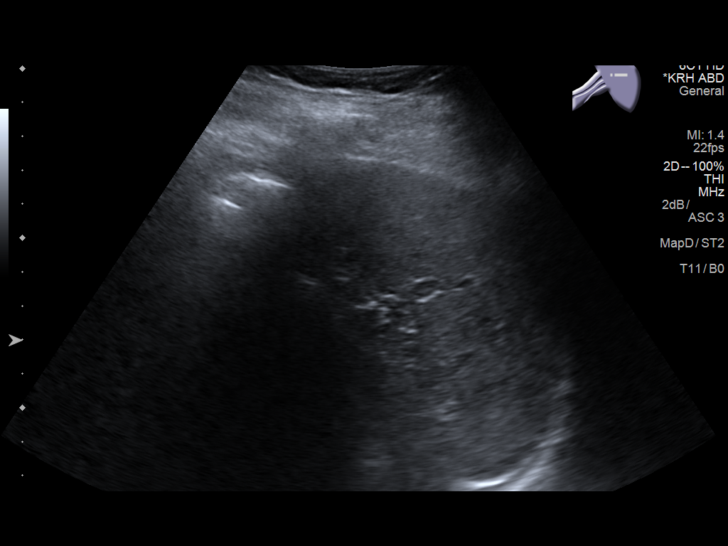
[im 49/66]
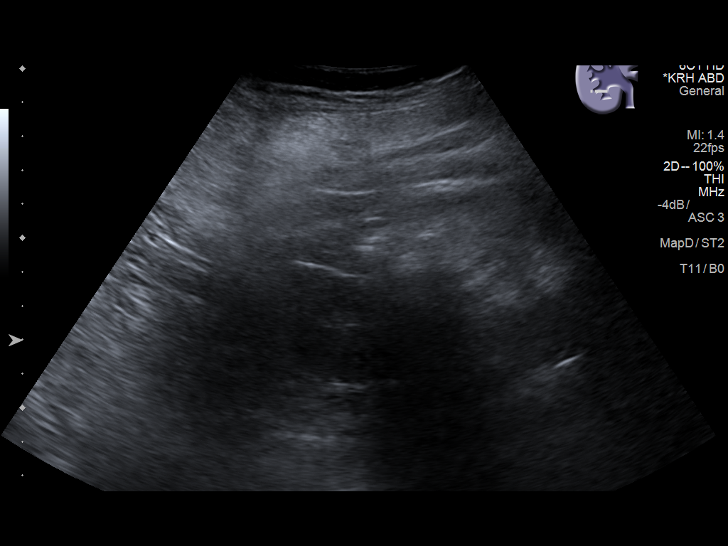
[im 55/66]
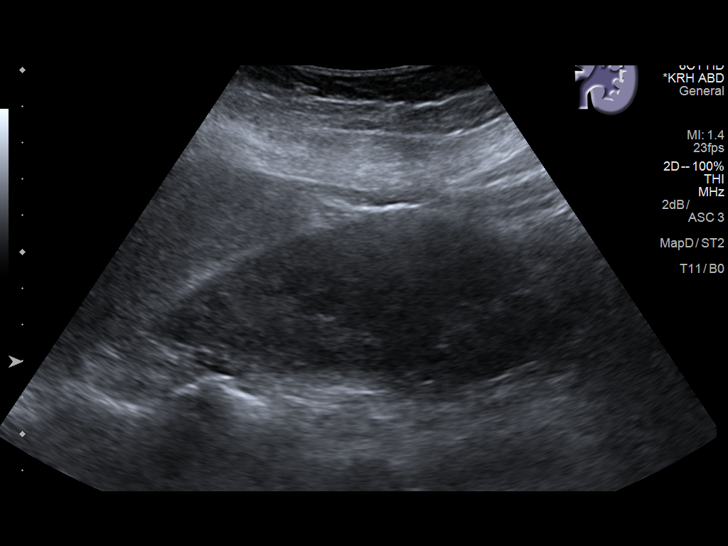
[im 60/66]
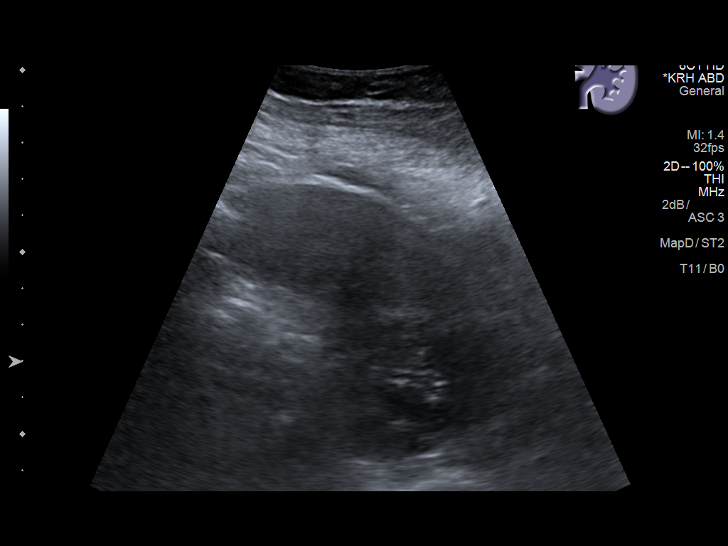
[im 66/66]
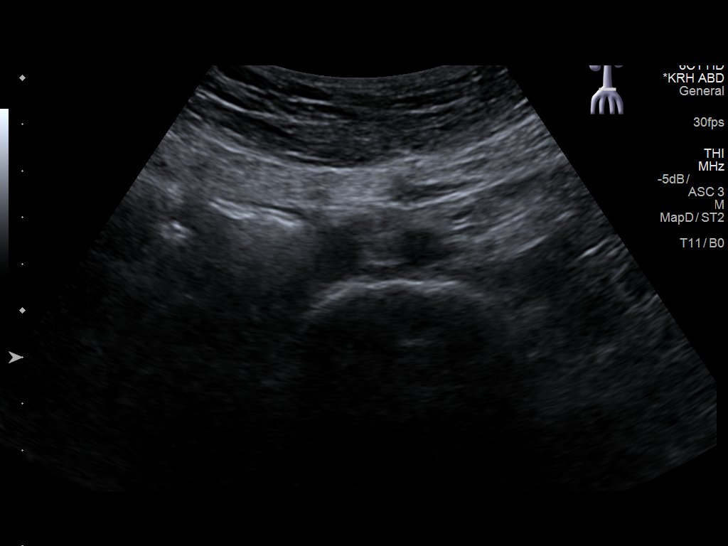

[14 of 25 positions shown; findings below may reference images not displayed]

FINDINGS: Gallbladder: The gallbladder is unremarkable. There is no evidence
of cholelithiasis or acute cholecystitis.

Common bile duct: Diameter: 3 mm. No evidence of intrahepatic or
extrahepatic biliary dilatation.

Liver: No focal lesion identified. Within normal limits in
parenchymal echogenicity. Portal vein is patent on color Doppler
imaging with normal direction of blood flow towards the liver.

IVC: No abnormality visualized.

Pancreas: Visualized portion unremarkable.

Spleen: Size and appearance within normal limits.

Right Kidney: Length: 12.7 cm. Echogenicity within normal limits. No
mass or hydronephrosis visualized.

Left Kidney: Length: 10.9 cm. Echogenicity within normal limits. No
mass or hydronephrosis visualized.

Abdominal aorta: No aneurysm visualized.

Other findings: None.
IMPRESSION: Unremarkable abdominal ultrasound.

## 2019-06-27 ENCOUNTER — Encounter: Payer: Self-pay | Admitting: Family

## 2019-07-08 ENCOUNTER — Encounter: Payer: Self-pay | Admitting: Family

## 2019-07-10 ENCOUNTER — Telehealth: Payer: Self-pay

## 2019-07-10 NOTE — Telephone Encounter (Signed)
Spoke w/ Angelica from [facility]- additional questions answered. Awaiting determination.

## 2019-07-10 NOTE — Telephone Encounter (Signed)
PA initiated via Covermymeds; KEY: [id number]. Awaiting determination.

## 2019-07-11 NOTE — Telephone Encounter (Signed)
This request was denied because you did not meet the following clinical requirements: Based on the information provided, you do not meet the established medication-specific criteria or guidelines for Wellbutrin Xl at this time. You have met the criteria to receive this medication. Wellbutrin XL '150mg'$  extended-release tablet has been approved through 07/09/2020. However, per your health plan's criteria, this drug is covered at a cost reduction if you meet the following: (1) You have tried two generic drugs that are equal to this drug from different drug companies. The information provided does not show that you meet the criteria listed above. Please speak with your doctor about your choices.   Informed Pt via Mychart- instructed her to let us know if she has any problems getting medication at pharmacy.

## 2019-07-12 ENCOUNTER — Telehealth: Payer: PRIVATE HEALTH INSURANCE | Admitting: Neurology

## 2019-08-10 ENCOUNTER — Other Ambulatory Visit: Payer: Self-pay | Admitting: Family

## 2019-08-10 ENCOUNTER — Encounter: Payer: Self-pay | Admitting: Family

## 2019-08-10 NOTE — Telephone Encounter (Signed)
Requesting:ambien  Contract:no  UDS:03/21/18  Last OV:02/06/19 Next OV:n/a Last Refill:05/01/2019  #[id number] Database:   Please advise

## 2019-08-15 LAB — HM MAMMOGRAPHY

## 2019-08-23 ENCOUNTER — Other Ambulatory Visit: Payer: Self-pay | Admitting: Family

## 2019-09-22 ENCOUNTER — Other Ambulatory Visit: Payer: Self-pay

## 2019-09-25 ENCOUNTER — Encounter: Payer: Self-pay | Admitting: Family

## 2019-09-25 ENCOUNTER — Ambulatory Visit (INDEPENDENT_AMBULATORY_CARE_PROVIDER_SITE_OTHER): Payer: PRIVATE HEALTH INSURANCE | Admitting: Family

## 2019-09-25 ENCOUNTER — Other Ambulatory Visit: Payer: Self-pay

## 2019-09-25 VITALS — BP 130/89 | HR 97 | Temp 97.9°F | Resp 16 | Ht 67.0 in | Wt 168.0 lb

## 2019-09-25 DIAGNOSIS — F419 Anxiety disorder, unspecified: Secondary | ICD-10-CM

## 2019-09-25 DIAGNOSIS — F32A Depression, unspecified: Secondary | ICD-10-CM

## 2019-09-25 DIAGNOSIS — F329 Major depressive disorder, single episode, unspecified: Secondary | ICD-10-CM | POA: Diagnosis not present

## 2019-09-25 DIAGNOSIS — M2550 Pain in unspecified joint: Secondary | ICD-10-CM

## 2019-09-25 DIAGNOSIS — Z0000 Encounter for general adult medical examination without abnormal findings: Secondary | ICD-10-CM

## 2019-09-25 DIAGNOSIS — H9319 Tinnitus, unspecified ear: Secondary | ICD-10-CM | POA: Diagnosis not present

## 2019-09-25 LAB — CBC WITH DIFFERENTIAL/PLATELET
Basophils Absolute: 0 10*3/uL (ref 0.0–0.1)
Basophils Relative: 0.7 % (ref 0.0–3.0)
Eosinophils Absolute: 0.1 10*3/uL (ref 0.0–0.7)
Eosinophils Relative: 2.7 % (ref 0.0–5.0)
HCT: 41.9 % (ref 36.0–46.0)
Hemoglobin: 13.8 g/dL (ref 12.0–15.0)
Lymphocytes Relative: 28.9 % (ref 12.0–46.0)
Lymphs Abs: 1.3 10*3/uL (ref 0.7–4.0)
MCHC: 32.9 g/dL (ref 30.0–36.0)
MCV: 88.8 fl (ref 78.0–100.0)
Monocytes Absolute: 0.5 10*3/uL (ref 0.1–1.0)
Monocytes Relative: 11.7 % (ref 3.0–12.0)
Neutro Abs: 2.6 10*3/uL (ref 1.4–7.7)
Neutrophils Relative %: 56 % (ref 43.0–77.0)
Platelets: 205 10*3/uL (ref 150.0–400.0)
RBC: 4.72 Mil/uL (ref 3.87–5.11)
RDW: 13 % (ref 11.5–15.5)
WBC: 4.6 10*3/uL (ref 4.0–10.5)

## 2019-09-25 LAB — HEPATIC FUNCTION PANEL
ALT: 17 U/L (ref 0–35)
AST: 18 U/L (ref 0–37)
Albumin: 4.2 g/dL (ref 3.5–5.2)
Alkaline Phosphatase: 66 U/L (ref 39–117)
Bilirubin, Direct: 0.1 mg/dL (ref 0.0–0.3)
Total Bilirubin: 0.3 mg/dL (ref 0.2–1.2)
Total Protein: 6.6 g/dL (ref 6.0–8.3)

## 2019-09-25 LAB — LIPID PANEL
Cholesterol: 175 mg/dL (ref 0–200)
HDL: 44.4 mg/dL (ref >39.00)
LDL Cholesterol: 99 mg/dL (ref 0–99)
NonHDL: 130.25
Total CHOL/HDL Ratio: 4
Triglycerides: 157 mg/dL — ABNORMAL HIGH (ref 0.0–149.0)
VLDL: 31.4 mg/dL (ref 0.0–40.0)

## 2019-09-25 LAB — BASIC METABOLIC PANEL
BUN: 14 mg/dL (ref 6–23)
CO2: 28 mEq/L (ref 19–32)
Calcium: 9.7 mg/dL (ref 8.4–10.5)
Chloride: 104 mEq/L (ref 96–112)
Creatinine, Ser: 1.04 mg/dL (ref 0.40–1.20)
GFR: 55.13 mL/min — ABNORMAL LOW (ref >60.00)
Glucose, Bld: 76 mg/dL (ref 70–99)
Potassium: 4.6 mEq/L (ref 3.5–5.1)
Sodium: 138 mEq/L (ref 135–145)

## 2019-09-25 LAB — TSH: TSH: 3.41 u[IU]/mL (ref 0.35–4.50)

## 2019-09-25 LAB — SEDIMENTATION RATE: Sed Rate: 25 mm/hr (ref 0–30)

## 2019-09-25 MED ORDER — METHOCARBAMOL 500 MG PO TABS: 500.0000 mg | ORAL_TABLET | Freq: Three times a day (TID) | ORAL | 0 refills | Status: DC | PRN

## 2019-09-25 MED ORDER — DICLOFENAC SODIUM 75 MG PO TBEC: 75.0000 mg | DELAYED_RELEASE_TABLET | Freq: Two times a day (BID) | ORAL | 0 refills | Status: DC | PRN

## 2019-09-25 NOTE — Progress Notes (Signed)
Subjective:    Patient ID: Pamela Sanders, female    DOB: 07-Dec-1965, 54 y.o.   MRN: [id number]  HPI   Patient is a 54 yr old female who presents today for cpx.  Immunizations: completed Medical illustrator.  Tdap 2015, completed shingrix.  Diet:  healthy Exercise:  Not as much as she should  Wt Readings from Last 3 Encounters:  09/25/19 168 lb (76.2 kg)  03/08/19 170 lb (77.1 kg)  01/27/19 174 lb 9.6 oz (79.2 kg)  Colonoscopy: 2016 Still having regular periods Pap Smear: GYN- Fults- reports up to date Mammogram: 08/15/19 Vision: up to date Dental:  Up to date  Easter Monday- she woke up with "extreme muscle strain."  This was in her neck and upper shoulders/and across her chest to her clavicle.  Took NSAIDS, heat, tens unit.  Reports that hit has calmed down some, but pain is "still pretty bad."  Reports that it hurts to lay on right shoulder.    Reports some hand/wrist pain, mild hip pain.    Review of Systems  Constitutional: Negative for unexpected weight change.  HENT: Positive for tinnitus. Negative for hearing loss and rhinorrhea.   Eyes: Negative for visual disturbance.  Respiratory: Negative for cough and shortness of breath.   Cardiovascular: Negative for chest pain.  Gastrointestinal: Positive for constipation (uses metamucil). Negative for diarrhea.  Genitourinary: Positive for menstrual problem (irregular perimenopausal bleeding following with gyn for this). Negative for dysuria and frequency.  Skin: Negative for rash.  Neurological: Positive for headaches (reports rare migraines at this point).  Hematological: Negative for adenopathy.  Psychiatric/Behavioral:       Some anxiety due to covid    Past Medical History:  Diagnosis Date  . Allergic rhinitis   . Depression (emotion)   . DJD (degenerative joint disease)    chronic neck pain  . GERD (gastroesophageal reflux disease)   . History of diverticulitis of colon   . Migraine   .  Osteoarthritis      Social History   Socioeconomic History  . Marital status: Married    Spouse name: [name]  . Number of children: 0  . Years of education: Not on file  . Highest education level: Doctorate  Occupational History  . Occupation: Probation officer: [employer]  Tobacco Use  . Smoking status: Never Smoker  . Smokeless tobacco: Never Used  Substance and Sexual Activity  . Alcohol use: Yes    Alcohol/week: 0.0 standard drinks    Comment: 2 drinks weekly, wine  . Drug use: No  . Sexual activity: Not on file  Other Topics Concern  . 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 She lives with her wife in a 2 story home. She drinks 1-2 cups of coffee a day and 1-2 glasses of tea. She exercises regularly.   Social Determinants of Health   Financial Resource Strain:   . Difficulty of Paying Living Expenses:   Food Insecurity:   . Worried About Charity fundraiser in the Last Year:   . [employer]  of Food in the Last Year:   Transportation Needs:   . Film/video editor (Medical):   Marland Kitchen Lack of Transportation (Non-Medical):   Physical Activity:   . Days of Exercise per Week:   . Minutes of Exercise per Session:   Stress:   . Feeling of Stress :   Social Connections:   . Frequency of Communication with Friends and Family:   . Frequency of Social Gatherings with Friends and Family:   . Attends Religious Services:   . Active Member of Clubs or Organizations:   . Attends Archivist Meetings:   Marland Kitchen Marital Status:   Intimate Partner Violence:   . Fear of Current or Ex-Partner:   . Emotionally Abused:   Marland Kitchen Physically Abused:   . Sexually Abused:     Past Surgical History:    Procedure Laterality Date  . APPENDECTOMY    . CERVICAL DISCECTOMY     Dr. Harl [facility] 06/2011[facility]  . HEMORROIDECTOMY    . OVARIAN CYST SURGERY     abdominal surgery for ovarian cyst  . TISSUE GRAFT  02/07/15   pt reports gum graft for receeding gums--Dr Geralynn Ochs  . TMJ ARTHROSCOPY     TMJ surgery    Family History  Problem Relation Age of Onset  . Arthritis Other   . Breast cancer Other   . Coronary artery disease Maternal Grandmother   . Breast cancer Maternal Grandmother        carcinoid tumor in abd  . Hypertension Other   . Diverticulitis Mother   . Arthritis Mother   . Pancreatic cancer Other     Allergies  Allergen Reactions  . Penicillins Hives  . Shingrix [Zoster Vac Recomb Adjuvanted]     Fever, myalgia, local redness    Current Outpatient Medications on File Prior to Visit  Medication Sig Dispense Refill  . ALPRAZolam (XANAX) 0.25 MG tablet TAKE 1 TABLET BY MOUTH TWO TIMES DAILY AS NEEDED FOR ANXIETY 30 tablet 0  . augmented betamethasone dipropionate (DIPROLENE) 0.05 % ointment Apply topically 2 (two) times daily. 30 g 0  . b complex vitamins capsule Take by mouth daily.    . calcium citrate-vitamin D (CITRACAL+D) 315-200 MG-UNIT per tablet Take 1 tablet by mouth 2 (two) times daily.      . Diclofenac Sodium (PENNSAID) 2 % SOLN Place 1 application onto the skin 2 (two) times daily. 1 Bottle 3  . diphenhydrAMINE (BENADRYL) 25 MG tablet Take by mouth.    . famotidine (PEPCID AC) 10 MG chewable tablet Chew 10 mg by mouth 2 (two) times daily.    Marland Kitchen gabapentin (NEURONTIN) 300 MG capsule Take 2 capsules (600 mg total) by mouth 2 (two) times daily. 360 capsule 1  . hyoscyamine (LEVSIN SL) 0.125 MG SL tablet Place 1 tablet (0.125 mg total) under the tongue every 6 (six) hours as needed. 20 tablet 0  . loratadine (CLARITIN) 10 MG tablet Take 10 mg by mouth daily.      . Melatonin 5 MG CAPS Take by mouth.    . Multiple Vitamins-Minerals (CENTRUM PO) Take by mouth daily.       . SUMAtriptan (IMITREX) 50 MG tablet Take 100 mg by mouth every 2 (two) hours as needed.     Marland Kitchen tiZANidine (ZANAFLEX) 4 MG tablet Take 1 tablet (4 mg total) by mouth at bedtime. 90 tablet 3  . WELLBUTRIN XL 150 MG 24 hr tablet TAKE TWO TABLETS DAILY 180 tablet 1  . zolpidem (  AMBIEN) 5 MG tablet TAKE 1 TABLET BY MOUTH AT BEDTIME AS NEEDED FOR SLEEP 30 tablet 0   No current facility-administered medications on file prior to visit.    BP 130/89 (BP Location: Right Arm, Patient Position: Sitting, Cuff Size: Small)   Pulse 97   Temp 97.9 F (36.6 C) (Temporal)   Resp 16   Ht 5\' 7"  (1.702 m)   Wt 168 lb (76.2 kg)   SpO2 100%   BMI 26.31 kg/m       Objective:   Physical Exam  Physical Exam  Constitutional: She is oriented to person, place, and time. She appears well-developed and well-nourished. No distress.  HENT:  Head: Normocephalic and atraumatic.  Right Ear: Tympanic membrane and ear canal normal.  Left Ear: Tympanic membrane and ear canal normal.  Mouth/Throat: Not examined- pt wearing mask Eyes: Pupils are equal, round, and reactive to light. No scleral icterus.  Neck: Normal range of motion. No thyromegaly present.  Cardiovascular: Normal rate and regular rhythm.   No murmur heard. Pulmonary/Chest: Effort normal and breath sounds normal. No respiratory distress. He has no wheezes. She has no rales. She exhibits no tenderness.  Abdominal: Soft. Bowel sounds are normal. She exhibits no distension and no mass. There is no tenderness. There is no rebound and no guarding.  Musculoskeletal: She exhibits no edema. right clavicle is slightly more prominent than left Lymphadenopathy:    She has no cervical adenopathy. small <1 cm mobile lymph node above the right clavicle medially Neurological: She is alert and oriented to person, place, and time. She has normal patellar reflexes. She exhibits normal muscle tone. Coordination normal.  Skin: Skin is warm and dry.  Psychiatric: She  has a normal mood and affect. Her behavior is normal. Judgment and thought content normal.  Breast/pelvic:  deferred           Assessment & Plan:   Preventative care- Encouraged pt to work on healthy diet, exercise and weight loss.  Pap and mammogram up to date. Colo up to date. Obtain routine lab work.  Depression- some situational depression related to covid. She is starting to socialize more outside and get up a bit more.  Will continue wellbutrin- monitor.   Insomnia- stable on prn ambien.  Anxiety- rare use of xanax.  Due for UDS and updated controlled substance contract today.  Tinnitis- will refer to audiology for further evaluation.  Neck pain- trial of prn voltaren.    This visit occurred during the SARS-CoV-2 public health emergency.  Safety protocols were in place, including screening questions prior to the visit, additional usage of staff PPE, and extensive cleaning of exam room while observing appropriate contact time as indicated for disinfecting solutions.        Assessment & Plan:

## 2019-09-26 LAB — PAIN MGMT, PROFILE 8 W/CONF, U
6 Acetylmorphine: NEGATIVE ng/mL
Alcohol Metabolites: NEGATIVE ng/mL (ref <500)
Amphetamines: NEGATIVE ng/mL
Benzodiazepines: NEGATIVE ng/mL
Buprenorphine, Urine: NEGATIVE ng/mL
Cocaine Metabolite: NEGATIVE ng/mL
Creatinine: 24.1 mg/dL
MDMA: NEGATIVE ng/mL
Marijuana Metabolite: NEGATIVE ng/mL
Opiates: NEGATIVE ng/mL
Oxidant: NEGATIVE ug/mL
Oxycodone: NEGATIVE ng/mL
pH: 7.1 (ref 4.5–9.0)

## 2019-09-27 LAB — ANTI-NUCLEAR AB-TITER (ANA TITER): ANA Titer 1: 1:40 {titer} — ABNORMAL HIGH

## 2019-09-27 LAB — ANA: Anti Nuclear Antibody (ANA): POSITIVE — AB

## 2019-09-27 LAB — RHEUMATOID FACTOR: Rheumatoid fact SerPl-aCnc: <14 IU/mL (ref <14)

## 2019-09-28 ENCOUNTER — Telehealth: Payer: Self-pay | Admitting: Family

## 2019-09-28 DIAGNOSIS — R768 Other specified abnormal immunological findings in serum: Secondary | ICD-10-CM

## 2019-09-28 NOTE — Telephone Encounter (Signed)
Please contact pt and let her know that one of her autoimmune tests is abnormal.  I would like for her to meet with rheumatology for further evaluation.

## 2019-10-01 ENCOUNTER — Other Ambulatory Visit: Payer: Self-pay | Admitting: Family

## 2019-10-15 DIAGNOSIS — M25511 Pain in right shoulder: Secondary | ICD-10-CM

## 2019-10-15 HISTORY — DX: Pain in right shoulder: M25.511

## 2019-11-07 ENCOUNTER — Encounter: Payer: Self-pay | Admitting: Family

## 2019-11-07 DIAGNOSIS — M7918 Myalgia, other site: Secondary | ICD-10-CM

## 2019-11-10 ENCOUNTER — Encounter: Payer: Self-pay | Admitting: Family Medicine

## 2019-11-10 ENCOUNTER — Ambulatory Visit: Payer: Self-pay

## 2019-11-10 ENCOUNTER — Ambulatory Visit: Payer: PRIVATE HEALTH INSURANCE | Admitting: Family Medicine

## 2019-11-10 ENCOUNTER — Other Ambulatory Visit: Payer: Self-pay

## 2019-11-10 VITALS — BP 133/82 | HR 93 | Ht 67.0 in | Wt 168.0 lb

## 2019-11-10 DIAGNOSIS — M7711 Lateral epicondylitis, right elbow: Secondary | ICD-10-CM

## 2019-11-10 DIAGNOSIS — M25511 Pain in right shoulder: Secondary | ICD-10-CM

## 2019-11-10 MED ORDER — PREDNISONE 5 MG PO TABS
ORAL_TABLET | ORAL | 0 refills | Status: DC
Start: 2019-11-10 — End: 2020-01-01

## 2019-11-10 NOTE — Patient Instructions (Signed)
Good to see you  Please try to make your grips bigger if you can.  Please try the exercises  Please try heat on the neck and trapezius  Please try ice on the elbows.  Please send me a message in MyChart with any questions or updates.  Please see me back in 4 weeks or sooner if needed.   --Dr. Raeford Razor

## 2019-11-10 NOTE — Progress Notes (Signed)
Pamela Sanders - 54 y.o. female MRN [id number]  Date of birth: 13-Jun-1965  SUBJECTIVE:  Including CC & ROS.  Chief Complaint  Patient presents with  . Shoulder Pain    right    Pamela Sanders is a 54 y.o. female that is presenting with right sternoclavicular pain as well as right elbow pain.  She has noticed significant swelling in the sternoclavicular joint on the right.  It is ranged from being large to mildly swollen.  She also feels pain along the trapezius as well as the clavicle shaft itself.  Denies any inciting event or trauma.  She has a rheumatology appointment scheduled in August.  She is unable to take ibuprofen on a regular basis due to upset stomach.  She has a history of right elbow pain.  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  She received a lateral epicondyle injection previously.   Review of Systems See HPI   HISTORY: Past Medical, Surgical, Social, and Family History Reviewed & Updated per EMR.   Pertinent Historical Findings include:  Past Medical History:  Diagnosis Date  . Allergic rhinitis   . Depression (emotion)   . DJD (degenerative joint disease)    chronic neck pain  . GERD (gastroesophageal reflux disease)   . History of diverticulitis of colon   . Migraine   . Osteoarthritis     Past Surgical History:  Procedure Laterality Date  . APPENDECTOMY    . CERVICAL DISCECTOMY     Dr. Harl Bowie 06/2011[facility]  . HEMORROIDECTOMY    . OVARIAN CYST SURGERY     abdominal surgery for ovarian cyst  . TISSUE GRAFT  02/07/15   pt reports gum graft for receeding gums--Dr Geralynn Ochs  . TMJ ARTHROSCOPY     TMJ surgery    Family History  Problem Relation Age of Onset  . Arthritis Other   . Breast cancer Other   . Coronary artery disease Maternal Grandmother   . Breast cancer Maternal Grandmother        carcinoid tumor in abd  . Hypertension Other   . Diverticulitis Mother   . Arthritis Mother   .  Pancreatic cancer Other     Social History   Socioeconomic History  . Marital status: Married    Spouse name: [name]  . Number of children: 0  . Years of education: Not on file  . Highest education level: Doctorate  Occupational History  . Occupation: Probation officer: [employer]  Tobacco Use  . Smoking status: Never Smoker  . Smokeless tobacco: Never Used  Substance and Sexual Activity  . Alcohol use: Yes    Alcohol/week: 0.0 standard drinks    Comment: 2 drinks weekly, wine  . Drug use: No  . Sexual activity: Not on file  Other Topics Concern  . 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 She lives with her wife in a 2 story home. She drinks 1-2 cups of coffee a day and 1-2 glasses of tea. She exercises regularly.  Social Determinants of Health   Financial Resource Strain:   . Difficulty of Paying Living Expenses:   Food Insecurity:   . Worried About Charity fundraiser in the Last Year:   . Arboriculturist in the Last Year:   Transportation Needs:   . Film/video editor (Medical):   Marland Kitchen Lack of Transportation (Non-Medical):   Physical Activity:   . Days of Exercise per Week:   . Minutes of Exercise per Session:   Stress:   . Feeling of Stress :   Social Connections:   . Frequency of Communication with Friends and Family:   . Frequency of Social Gatherings with Friends and Family:   . Attends Religious Services:   . Active Member of Clubs or Organizations:   . Attends Archivist Meetings:   Marland Kitchen Marital Status:   Intimate Partner Violence:   . Fear of Current or Ex-Partner:   . Emotionally Abused:   Marland Kitchen Physically Abused:   . Sexually Abused:      PHYSICAL  EXAM:  VS: BP 133/82   Pulse 93   Ht 5\' 7"  (1.702 m)   Wt 168 lb (76.2 kg)   BMI 26.31 kg/m  Physical Exam Gen: NAD, alert, cooperative with exam, well-appearing MSK:  Right clavicle. No redness over the right sternoclavicular joint. The right sternoclavicular joint does appear to be more swollen than the left. Normal shoulder range of motion and active flexion abduction. Normal internal and external rotation. Normal empty can test. Right elbow. No swelling or ecchymosis. Tenderness to palpation over the medial and lateral condyle. Normal range of motion. Normal grip strength. Neurovascularly intact  Limited ultrasound: Right sternoclavicular joint, right shoulder, right elbow:  Right sternoclavicular joint: There appears to be a synovitis of the joint itself.  There is hyperemia associated with this area.   No changes of the clavicle shaft No changes of the overlying tissue.  Right shoulder: Normal-appearing biceps tendon. Chronic calcifications at the footprint of the subscapularis. Supraspinatus with minor chronic tendinopathy type changes. Normal-appearing posterior glenohumeral joint.  Right elbow: No effusion of the joint. Mucous changes of the origin of the common extensors. No hyperemia at the lateral condyle. Medial condyle with normal appearance.  No hyperemia of this area.  Summary: Findings suggest a synovitis of the sternoclavicular joint.  Right shoulder with minor chronic changes.  Right elbow with chronic tendinopathy changes of the common extensors.  Ultrasound and interpretation by Clearance Coots, MD    ASSESSMENT & PLAN:   Pain of right sternoclavicular joint Appears to have synovitis of the joint.  She had a mildly positive ANA.  She has rheumatology scheduled in August. -Prednisone. -Counseled on supportive care and home exercise therapy.  Lateral epicondylitis of right elbow Seems to have an acute exacerbation of lateral epicondylitis.   Does have chronic changes at the origin. -Counseled on home exercise therapy and supportive care -Prednisone. -Counseled on making the grips bigger of the packets for softening the grips. -Could consider injection or PRP injection.

## 2019-11-10 NOTE — Assessment & Plan Note (Signed)
Appears to have synovitis of the joint.  She had a mildly positive ANA.  She has rheumatology scheduled in August. -Prednisone. -Counseled on supportive care and home exercise therapy.

## 2019-11-10 NOTE — Assessment & Plan Note (Signed)
Seems to have an acute exacerbation of lateral epicondylitis.  Does have chronic changes at the origin. -Counseled on home exercise therapy and supportive care -Prednisone. -Counseled on making the grips bigger of the packets for softening the grips. -Could consider injection or PRP injection.

## 2019-11-13 ENCOUNTER — Telehealth: Payer: Self-pay | Admitting: Gastroenterology

## 2019-11-13 NOTE — Telephone Encounter (Signed)
Last seen 2 years ago, Hx IBS with bowel habits that tend to alternate constipation/diarrhea.  Recommend stop metamucil for new few days.  Miralax one capful daily, start today.  Continue until constipation relieved. Can increase to twice daily if no BM by this time tomorrow.

## 2019-11-13 NOTE — Telephone Encounter (Signed)
Pt states she has not seen Dr. Loletha Carrow since 2019. States she has h/o IBS, constipation and diverticulitis. States on Friday she started having issues with Constipation. She has tried metamucil and had some small BMs but reports she has some LLQ abd pain, is bloated and having some nausea but no fever. Please advise.

## 2019-11-13 NOTE — Telephone Encounter (Signed)
Spoke with pt and she is aware of Dr. Loletha Carrow' recommendations.

## 2019-12-08 ENCOUNTER — Ambulatory Visit: Payer: PRIVATE HEALTH INSURANCE | Admitting: Family Medicine

## 2019-12-08 ENCOUNTER — Other Ambulatory Visit: Payer: Self-pay

## 2019-12-08 ENCOUNTER — Encounter: Payer: Self-pay | Admitting: Family Medicine

## 2019-12-08 VITALS — BP 120/79 | HR 76 | Ht 67.0 in | Wt 170.0 lb

## 2019-12-08 DIAGNOSIS — M25511 Pain in right shoulder: Secondary | ICD-10-CM

## 2019-12-08 DIAGNOSIS — R0782 Intercostal pain: Secondary | ICD-10-CM

## 2019-12-08 NOTE — Assessment & Plan Note (Signed)
Pain is still ongoing.  Mild improvement with prednisone.  Unclear if this is related to some structural change versus autoimmune. -Counseled supportive care. -CT chest and soft tissue neck to evaluate for any possible mass or autoimmune origin of the sternoclavicular joint

## 2019-12-08 NOTE — Progress Notes (Signed)
Pamela Sanders - 54 y.o. female MRN [id number]  Date of birth: 24-Jan-1966  SUBJECTIVE:  Including CC & ROS.  Chief Complaint  Patient presents with  . Follow-up    right sternoclavicular joint    Pamela Sanders is a 54 y.o. female that is following up for her right sternoclavicular joint.  Still having pain.  Seems to notice some right-sided chest and shoulder pain.   Review of Systems See HPI   HISTORY: Past Medical, Surgical, Social, and Family History Reviewed & Updated per EMR.   Pertinent Historical Findings include:  Past Medical History:  Diagnosis Date  . Allergic rhinitis   . Depression (emotion)   . DJD (degenerative joint disease)    chronic neck pain  . GERD (gastroesophageal reflux disease)   . History of diverticulitis of colon   . Migraine   . Osteoarthritis     Past Surgical History:  Procedure Laterality Date  . APPENDECTOMY    . CERVICAL DISCECTOMY     [facility]l [facility]  . HEMORROIDECTOMY    . OVARIAN CYST SURGERY     abdominal surgery for ovarian cyst  . TISSUE GRAFT  02/07/15   pt reports gum graft for receeding gums--Dr Geralynn Ochs  . TMJ ARTHROSCOPY     TMJ surgery    Family History  Problem Relation Age of Onset  . Arthritis Other   . Breast cancer Other   . Coronary artery disease Maternal Grandmother   . Breast cancer Maternal Grandmother        carcinoid tumor in abd  . Hypertension Other   . Diverticulitis Mother   . Arthritis Mother   . Pancreatic cancer Other     Social History   Socioeconomic History  . Marital status: Married    Spouse name: [name]  . Number of children: 0  . Years of education: Not on file  . Highest education level: Doctorate  Occupational History  . Occupation: Probation officer: [employer]  Tobacco Use  . Smoking status: Never Smoker  . Smokeless tobacco: Never Used  Substance and Sexual Activity  . Alcohol use: Yes    Alcohol/week: 0.0 standard drinks     Comment: 2 drinks weekly, wine  . Drug use: No  . Sexual activity: Not on file  Other Topics Concern  . 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 She lives with her wife in a 2 story home. She drinks 1-2 cups of coffee a day and 1-2 glasses of tea. She exercises regularly.   Social Determinants of Health   Financial Resource Strain:   . Difficulty of Paying Living Expenses:   Food Insecurity:   . Worried About Charity fundraiser in the Last Year:   . Arboriculturist in the Last Year:   Transportation Needs:   . Film/video editor (Medical):   Marland Kitchen Lack of Transportation (Non-Medical):   Physical Activity:   . Days of Exercise per Week:   . Minutes of Exercise per Session:   Stress:   . Feeling of Stress :  Social Connections:   . Frequency of Communication with Friends and Family:   . Frequency of Social Gatherings with Friends and Family:   . Attends Religious Services:   . Active Member of Clubs or Organizations:   . Attends Archivist Meetings:   Marland Kitchen Marital Status:   Intimate Partner Violence:   . Fear of Current or Ex-Partner:   . Emotionally Abused:   Marland Kitchen Physically Abused:   . Sexually Abused:      PHYSICAL EXAM:  VS: BP 120/79   Pulse 76   Ht 5\' 7"  (1.702 m)   Wt 170 lb (77.1 kg)   BMI 26.63 kg/m  Physical Exam Gen: NAD, alert, cooperative with exam, well-appearing     ASSESSMENT & PLAN:   Pain of right sternoclavicular joint Pain is still ongoing.  Mild improvement with prednisone.  Unclear if this is related to some structural change versus autoimmune. -Counseled supportive care. -CT chest and soft tissue neck to evaluate for any possible mass or autoimmune origin of the  sternoclavicular joint

## 2019-12-08 NOTE — Patient Instructions (Signed)
Good to see you Please try ice as needed   Please send me a message in MyChart with any questions or updates.  We will setup a virtual visit once the CT is resulted .   --Dr. Raeford Razor

## 2019-12-15 ENCOUNTER — Encounter: Payer: Self-pay | Admitting: Family Medicine

## 2019-12-19 NOTE — Addendum Note (Signed)
Addended by: Sherrie George F on: 12/19/2019 02:35 PM   Modules accepted: Orders

## 2019-12-21 ENCOUNTER — Ambulatory Visit (INDEPENDENT_AMBULATORY_CARE_PROVIDER_SITE_OTHER): Payer: PRIVATE HEALTH INSURANCE

## 2019-12-21 DIAGNOSIS — R0782 Intercostal pain: Secondary | ICD-10-CM

## 2019-12-31 DIAGNOSIS — R079 Chest pain, unspecified: Secondary | ICD-10-CM

## 2019-12-31 HISTORY — DX: Chest pain, unspecified: R07.9

## 2020-01-01 ENCOUNTER — Emergency Department (HOSPITAL_BASED_OUTPATIENT_CLINIC_OR_DEPARTMENT_OTHER): Payer: PRIVATE HEALTH INSURANCE

## 2020-01-01 ENCOUNTER — Telehealth: Payer: Self-pay | Admitting: Family Medicine

## 2020-01-01 ENCOUNTER — Other Ambulatory Visit: Payer: Self-pay

## 2020-01-01 ENCOUNTER — Telehealth: Payer: Self-pay

## 2020-01-01 ENCOUNTER — Encounter (HOSPITAL_BASED_OUTPATIENT_CLINIC_OR_DEPARTMENT_OTHER): Payer: Self-pay | Admitting: Emergency Medicine

## 2020-01-01 ENCOUNTER — Emergency Department (HOSPITAL_BASED_OUTPATIENT_CLINIC_OR_DEPARTMENT_OTHER)
Admission: EM | Admit: 2020-01-01 | Discharge: 2020-01-01 | Disposition: A | Discharge status: Home / Self Care | Payer: PRIVATE HEALTH INSURANCE | Attending: Emergency Medicine | Admitting: Emergency Medicine

## 2020-01-01 DIAGNOSIS — R079 Chest pain, unspecified: Secondary | ICD-10-CM | POA: Insufficient documentation

## 2020-01-01 DIAGNOSIS — R0602 Shortness of breath: Secondary | ICD-10-CM | POA: Diagnosis not present

## 2020-01-01 LAB — COMPREHENSIVE METABOLIC PANEL
ALT: 19 U/L (ref 0–44)
AST: 23 U/L (ref 15–41)
Albumin: 3.9 g/dL (ref 3.5–5.0)
Alkaline Phosphatase: 77 U/L (ref 38–126)
Anion gap: 8 (ref 5–15)
BUN: 12 mg/dL (ref 6–20)
CO2: 25 mmol/L (ref 22–32)
Calcium: 8.7 mg/dL — ABNORMAL LOW (ref 8.9–10.3)
Chloride: 103 mmol/L (ref 98–111)
Creatinine, Ser: 0.91 mg/dL (ref 0.44–1.00)
GFR calc Af Amer: >60 mL/min (ref >60)
GFR calc non Af Amer: >60 mL/min (ref >60)
Glucose, Bld: 91 mg/dL (ref 70–99)
Potassium: 4 mmol/L (ref 3.5–5.1)
Sodium: 136 mmol/L (ref 135–145)
Total Bilirubin: 0.1 mg/dL — ABNORMAL LOW (ref 0.3–1.2)
Total Protein: 6.9 g/dL (ref 6.5–8.1)

## 2020-01-01 LAB — CBC WITH DIFFERENTIAL/PLATELET
Abs Immature Granulocytes: 0.02 10*3/uL (ref 0.00–0.07)
Basophils Absolute: 0 10*3/uL (ref 0.0–0.1)
Basophils Relative: 1 %
Eosinophils Absolute: 0.1 10*3/uL (ref 0.0–0.5)
Eosinophils Relative: 1 %
HCT: 39.8 % (ref 36.0–46.0)
Hemoglobin: 13 g/dL (ref 12.0–15.0)
Immature Granulocytes: 0 %
Lymphocytes Relative: 19 %
Lymphs Abs: 1.2 10*3/uL (ref 0.7–4.0)
MCH: 29.2 pg (ref 26.0–34.0)
MCHC: 32.7 g/dL (ref 30.0–36.0)
MCV: 89.4 fL (ref 80.0–100.0)
Monocytes Absolute: 0.8 10*3/uL (ref 0.1–1.0)
Monocytes Relative: 12 %
Neutro Abs: 4.4 10*3/uL (ref 1.7–7.7)
Neutrophils Relative %: 67 %
Platelets: 208 10*3/uL (ref 150–400)
RBC: 4.45 MIL/uL (ref 3.87–5.11)
RDW: 12.8 % (ref 11.5–15.5)
WBC: 6.5 10*3/uL (ref 4.0–10.5)
nRBC: 0 % (ref 0.0–0.2)

## 2020-01-01 LAB — TROPONIN I (HIGH SENSITIVITY)
Troponin I (High Sensitivity): 3 ng/L (ref <18)
Troponin I (High Sensitivity): 2 ng/L (ref <18)

## 2020-01-01 MED ORDER — ASPIRIN 81 MG PO CHEW
324.0000 mg | CHEWABLE_TABLET | Freq: Once | ORAL | Status: AC
  Administered 2020-01-01: 324 mg via ORAL
  Filled 2020-01-01: qty 4

## 2020-01-01 NOTE — ED Notes (Signed)
Presents with chest pain, onset yesterday had some SOB with that experience, again began having chest pain and sob again. Points to area at mid sternum, feels tightness and pressure. Cannot find a defining moment that would note pain is worse or better.

## 2020-01-01 NOTE — Telephone Encounter (Signed)
Nurse Assessment Nurse: Melina Modena, RN, Estill Bamberg Date/Time Eilene Ghazi Time): 01/01/2020 9:18:10 AM Confirm and document reason for call. If symptomatic, describe symptoms. ---Caller states she has chest pain and sob. Has the patient had close contact with a person known or suspected to have the novel coronavirus illness OR traveled / lives in area with major community spread (including international travel) in the last 14 days from the onset of symptoms? * If Asymptomatic, screen for exposure and travel within the last 14 days. ---No Does the patient have any new or worsening symptoms? ---Yes Will a triage be completed? ---Yes Related visit to physician within the last 2 weeks? ---No Does the PT have any chronic conditions? (i.e. diabetes, asthma, this includes High risk factors for pregnancy, etc.) ---Yes List chronic conditions. ---none Is the patient pregnant or possibly pregnant? (Ask all females between the ages of 40-55) ---No Is this a behavioral health or substance abuse call? ---No Guidelines Guideline Title Affirmed Question Affirmed Notes Nurse Date/Time (Eastern Time) Chest Pain [1] Chest pain lasts > 5 minutes AND [2] age > 63 [place], RN, Estill Bamberg 01/01/2020 9:19:36 AM PLEASE NOTE: All timestamps contained within this report are represented as [place] Standard Time. CONFIDENTIALTY NOTICE: This fax transmission is intended only for the addressee. It contains information that is legally privileged, confidential or otherwise protected from use or disclosure. If you are not the intended recipient, you are strictly prohibited from reviewing, disclosing, copying using or disseminating any of this information or taking any action in reliance on or regarding this information. If you have received this fax in error, please notify us immediately by telephone so that we can arrange for its return to [place]. Phone: [phone], Toll-Free: [phone], Fax: [phone] Page: 2 of 2 Call Id:  [id number] [place]. Time Eilene Ghazi Time) Disposition Final User 01/01/2020 9:17:14 AM Send to Urgent Queue Silvano Rusk 01/01/2020 9:22:42 AM Call EMS 911 Now Yes Melina Modena, RN, Shelly Coss Disagree/Comply Comply Caller Understands Yes PreDisposition Go to ED Care Advice Given Per Guideline CALL EMS 911 NOW: CARE ADVICE given per Chest Pain (Adult) guideline  Pt currently at ED.

## 2020-01-01 NOTE — Telephone Encounter (Signed)
Pt left msg to call office to schedule Virtual [facility] for CT scan review.  Just an fyi to confirm ( call made to pt).  --glh

## 2020-01-01 NOTE — ED Notes (Signed)
ED Provider at bedside. 

## 2020-01-01 NOTE — ED Provider Notes (Signed)
[place] EMERGENCY DEPARTMENT Provider Note   CSN: [id number] Arrival date & time: 01/01/20  1006     History Chief Complaint  Patient presents with  . Chest Pain    Pamela Sanders is a 54 y.o. female with a history of migraines, depression, insomnia, & GERD who presents to the ED with complaints of chest discomfort episodes that began yesterday. Patient states discomfort is tightness/pressure to the central lower chest that is non-radiating, occurs in episodes that last 5-10 minutes at a time prior to resolution. No specific triggers or alleviating/aggravating factors. Does not seem prompted by exertion, a deep breath, or food. Had approximately 6-8 episodes yesterday and a few this AM with some dyspnea associated with the episodes prompting ED visit after calling her PCP. Denies nausea, vomiting, diaphoresis, syncope, dizziness, leg pain/swelling, hemoptysis, recent surgery/trauma, recent long travel, hormone use, personal hx of cancer, or hx of DVT/PE. Denies recent lifestyle changes, increased caffeine intake, , or drug use.      HPI     Past Medical History:  Diagnosis Date  . Allergic rhinitis   . Depression (emotion)   . DJD (degenerative joint disease)    chronic neck pain  . GERD (gastroesophageal reflux disease)   . History of diverticulitis of colon   . Migraine   . Osteoarthritis     Patient Active Problem List   Diagnosis Date Noted  . Pain of right sternoclavicular joint 11/10/2019  . Pain of left thumb 09/15/2018  . Lateral epicondylitis of right elbow 08/09/2018  . Insomnia 10/16/2015  . Hip pain 02/07/2015  . TMJ (dislocation of temporomandibular joint) 02/07/2015  . 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 Sinusitis 11/17/2013  . Routine general medical examination at a health care facility 08/09/2013  . General medical examination 12/03/2010  . LEUKOCYTOPENIA UNSPECIFIED 03/09/2008  . Depression 03/07/2008  . Migraine without aura 03/07/2008  . ALLERGIC  RHINITIS 03/07/2008  . GERD 03/07/2008  . OSTEOARTHRITIS 03/07/2008  . NECK PAIN, CHRONIC 03/07/2008  . DIVERTICULITIS, HX OF 03/07/2008    Past Surgical History:  Procedure Laterality Date  . APPENDECTOMY    . CERVICAL DISCECTOMY     Dr. Harl Bowie 06/2011[facility]  . HEMORROIDECTOMY    . OVARIAN CYST SURGERY     abdominal surgery for ovarian cyst  . TISSUE GRAFT  02/07/15   pt reports gum graft for receeding gums--Dr Geralynn Ochs  . TMJ ARTHROSCOPY     TMJ surgery     OB History   No obstetric history on file.     Family History  Problem Relation Age of Onset  . Arthritis Other   . Breast cancer Other   . Coronary artery disease Maternal Grandmother   . Breast cancer Maternal Grandmother        carcinoid tumor in abd  . Hypertension Other   . Diverticulitis Mother   . Arthritis Mother   . Pancreatic cancer Other     Social History   Tobacco Use  . Smoking status: Never Smoker  . Smokeless tobacco: Never Used  Substance Use Topics  . Alcohol use: Yes    Alcohol/week: 0.0 standard drinks    Comment: 2 drinks weekly, wine  . Drug use: No    Home Medications Prior to Admission medications   Medication Sig Start Date End Date Taking? Authorizing Provider  ALPRAZolam Duanne Moron) 0.25 MG tablet TAKE 1 TABLET BY MOUTH TWO TIMES DAILY AS NEEDED FOR ANXIETY 10/02/19   [name], NP  augmented betamethasone dipropionate (  DIPROLENE) 0.05 % ointment Apply topically 2 (two) times daily. 03/30/19   [name], NP  b complex vitamins capsule Take by mouth daily.    [provider]  calcium citrate-vitamin D (CITRACAL+D) 315-200 MG-UNIT per tablet Take 1 tablet by mouth 2 (two) times daily.      [provider]  diclofenac (VOLTAREN) 75 MG EC tablet Take 1 tablet (75 mg total) by mouth 2 (two) times daily as needed. 09/25/19   [name], NP  Diclofenac Sodium (PENNSAID) 2 % SOLN Place 1 application onto the skin 2 (two) times daily. 08/09/18    Rosemarie Ax, MD  diphenhydrAMINE (BENADRYL) 25 MG tablet Take by mouth.    [provider]  famotidine (PEPCID AC) 10 MG chewable tablet Chew 10 mg by mouth 2 (two) times daily.    [provider]  gabapentin (NEURONTIN) 300 MG capsule Take 2 capsules (600 mg total) by mouth 2 (two) times daily. 02/06/19   [name], NP  hyoscyamine (LEVSIN SL) 0.125 MG SL tablet Place 1 tablet (0.125 mg total) under the tongue every 6 (six) hours as needed. 10/19/17   Doran Stabler, MD  loratadine (CLARITIN) 10 MG tablet Take 10 mg by mouth daily.      [provider]  Melatonin 5 MG CAPS Take by mouth.    [provider]  methocarbamol (ROBAXIN) 500 MG tablet Take 1 tablet (500 mg total) by mouth every 8 (eight) hours as needed for muscle spasms. 09/25/19   [name], NP  Multiple Vitamins-Minerals (CENTRUM PO) Take by mouth daily.      [provider]  predniSONE (DELTASONE) 5 MG tablet Take 6 pills for first day, 5 pills second day, 4 pills third day, 3 pills fourth day, 2 pills the fifth day, and 1 pill sixth day. 11/10/19   Rosemarie Ax, MD  SUMAtriptan (IMITREX) 50 MG tablet Take 100 mg by mouth every 2 (two) hours as needed.     [provider]  tiZANidine (ZANAFLEX) 4 MG tablet Take 1 tablet (4 mg total) by mouth at bedtime. 03/08/18   Pieter Partridge, DO  WELLBUTRIN XL 150 MG 24 hr tablet TAKE TWO TABLETS DAILY 08/25/19   [name], NP  zolpidem (AMBIEN) 5 MG tablet TAKE 1 TABLET BY MOUTH AT BEDTIME AS NEEDED FOR SLEEP 08/10/19   [name], NP    Allergies    Penicillins and Shingrix [zoster vac recomb adjuvanted]  Review of Systems   Review of Systems  Constitutional: Negative for chills, diaphoresis and fever.  Respiratory: Positive for shortness of breath. Negative for cough.   Cardiovascular: Positive for chest pain.  Gastrointestinal: Negative for abdominal pain, nausea and vomiting.    Neurological: Negative for dizziness, syncope, weakness, numbness and headaches.  All other systems reviewed and are negative.   Physical Exam Updated Vital Signs BP 126/82 (BP Location: Right Arm)   Pulse 78   Temp 98.6 F (37 C) (Oral)   Resp 16   Ht 5\' 7"  (1.702 m)   Wt 76.2 kg   LMP 10/02/2019   SpO2 100%   BMI 26.31 kg/m   Physical Exam Vitals and nursing note reviewed.  Constitutional:      General: She is not in acute distress.    Appearance: She is well-developed. She is not toxic-appearing.  HENT:     Head: Normocephalic and atraumatic.  Eyes:     General:  Right eye: No discharge.        Left eye: No discharge.     Conjunctiva/sclera: Conjunctivae normal.  Cardiovascular:     Rate and Rhythm: Normal rate and regular rhythm.     Pulses:          Radial pulses are 2+ on the right side and 2+ on the left side.  Pulmonary:     Effort: Pulmonary effort is normal. No respiratory distress.     Breath sounds: Normal breath sounds. No wheezing, rhonchi or rales.  Abdominal:     General: There is no distension.     Palpations: Abdomen is soft.     Tenderness: There is no abdominal tenderness.  Musculoskeletal:     Cervical back: Neck supple.     Right lower leg: No tenderness. No edema.     Left lower leg: No tenderness. No edema.  Skin:    General: Skin is warm and dry.     Findings: No rash.  Neurological:     Mental Status: She is alert.     Comments: Clear speech.   Psychiatric:        Behavior: Behavior normal.     ED Results / Procedures / Treatments   Labs (all labs ordered are listed, but only abnormal results are displayed) Labs Reviewed  COMPREHENSIVE METABOLIC PANEL - Abnormal; Notable for the following components:      Result Value   Calcium 8.7 (*)    Total Bilirubin 0.1 (*)    All other components within normal limits  CBC WITH DIFFERENTIAL/PLATELET  TROPONIN I (HIGH SENSITIVITY)  TROPONIN I (HIGH SENSITIVITY)    EKG EKG  Interpretation  Date/Time:  Monday January 01 2020 10:26:59 EDT Ventricular Rate:  72 PR Interval:  150 QRS Duration: 82 QT Interval:  384 QTC Calculation: 420 R Axis:   50 Text Interpretation: Normal sinus rhythm Normal ECG No previous ECGs available Confirmed by Theotis Burrow [phone]) on 01/01/2020 10:57:34 AM   Radiology DG Chest 2 View  Result Date: 01/01/2020 CLINICAL DATA:  Chest pain EXAM: CHEST - 2 VIEW COMPARISON:  12/21/2019 CT chest. FINDINGS: Clear lungs. No pneumothorax or pleural effusion. Cardiomediastinal silhouette within normal limits. No acute or focal osseous abnormalities. Partially imaged ACDF. IMPRESSION: Clear lungs. No focal or acute osseous abnormalities. Electronically Signed   By: Primitivo Gauze M.D.   On: 01/01/2020 11:39    Procedures Procedures (including critical care time)  Medications Ordered in ED Medications  aspirin chewable tablet 324 mg (324 mg Oral Given 01/01/20 1155)    ED Course  I have reviewed the triage vital signs and the nursing notes.  Pertinent labs & imaging results that were available during my care of the patient were reviewed by me and considered in my medical decision making (see chart for details).    MDM Rules/Calculators/A&P                         Patient presents to the ED with complaints of chest pain intermittently since yesterday. Patient is nontoxic, resting comfortably, vitals without significant abnormality. Benign exam.   DDX: ACS, pulmonary embolism, dissection, pneumothorax, pneumonia, arrhythmia, severe anemia, MSK, GERD, anxiety.  Evaluation initiated with labs, EKG, and CXR. Patient on cardiac monitor.  EKG: NO STEMI.   Additional history obtained:  Additional history obtained from chart & nursing note review. Previous records obtained and reviewed: CT chest wo contrast 12/21/19: IMPRESSION: Small to moderate right sternoclavicular  joint effusion with capsular thickening could be due to crystal,  inflammatory or degenerative arthropathy. Septic arthritis is unlikely given absence of secondary bony changes and symptoms for approximately 6 weeks.  Lab Tests:  I Ordered, reviewed, and interpreted labs, which included:  CBC: No significant anemia or leukocytosis.  CMP: Mild hypocalcemia. No significant electrolyte derangement. LFTs & renal function WNL.  Troponin: 3, 2  Imaging Studies ordered:  I ordered imaging studies which included CXR, I independently visualized and interpreted imaging which showed no acute process.   HEAR score low risk, EKG without obvious acute ischemia, delta troponin negative, low suspicion for ACS. Patient is low risk wells, doubt pulmonary embolism. Pain is not a tearing sensation, symmetric pulses, no widening of mediastinum on CXR, doubt dissection. Cardiac monitor reviewed, no notable arrhythmias or tachycardia. Unclear definitive etiology, she does mention she has had increased stress recently- possibly contributory. Patient has appeared hemodynamically stable throughout ER visit and appears safe for discharge with close PCP/cardiology follow up.  I discussed results, treatment plan, need for PCP follow-up, and return precautions with the patient. Provided opportunity for questions, patient confirmed understanding and is in agreement with plan.    Portions of this note were generated with [employer]. Dictation errors may occur despite best attempts at proofreading.  Final Clinical Impression(s) / ED Diagnoses Final diagnoses:  Chest pain, unspecified type    Rx / DC Orders ED Discharge Orders    None       Amaryllis Dyke, PA-C 01/01/20 1449    Little, Wenda Overland, MD 01/04/20 [phone]

## 2020-01-01 NOTE — ED Notes (Signed)
AVS provided to pt, reinforced recommendation for cardiology consult and to follow up with primary care MD as well. Opportunity for questions provided as well.

## 2020-01-01 NOTE — ED Notes (Signed)
Placed on cont cardiac monitoring with cont POX and int NBP assessments, NSR noted on monitor without ectopy

## 2020-01-01 NOTE — Discharge Instructions (Addendum)
You were seen in the emergency department today for chest pain. Your work-up in the emergency department has been overall reassuring. Your labs have been fairly normal and or similar to previous blood work you have had done. Your EKG and the enzyme we use to check your heart did not show an acute heart attack at this time. Your chest x-ray was normal.  ° °We would like you to follow up closely with your primary care provider and/or the cardiologist provided in your discharge instructions within 1-3 days. Return to the ER immediately should you experience any new or worsening symptoms including but not limited to return of pain, worsened pain, vomiting, shortness of breath, dizziness, lightheadedness, passing out, or any other concerns that you may have.   °

## 2020-01-01 NOTE — ED Triage Notes (Signed)
Episodes of tight substernal CP yesterday and this morning lasting about 5-10 min each and some SOB at those times also.Marland Kitchen  Has seen Dr. Rosiland Oz for upper sternal pain and diagnosed with an inflammatory condition.  Has never had pain in low sternum before.

## 2020-01-02 NOTE — Progress Notes (Addendum)
Office Visit Note  Patient: Pamela Sanders             Date of Birth: [birth year]           MRN: [id number]             PCP: [name], NP Referring: [name], NP Visit Date: 01/16/2020 Occupation: '@GUAROCC'$ @  Subjective:  Pain in multiple joints.   History of Present Illness: Pamela Sanders is a 54 y.o. female seen in consultation per request of Ms. Inda Castle.  According the patient her symptoms started in May 2021 with right sternoclavicular joint pain and inflammation.  She states the symptoms got worse and she was referred to sports medicine.  She initially had ultrasound which showed some synovitis.  She had CT scan of her chest which was consistent with effusion in her right sternoclavicular joint.  She was given Voltaren tablets but she could not take them due to GI side effects.  She states she has history of disc disease of cervical spine and had discectomy and cervical spine fusion many years ago.  She continues to have some chronic neck pain.  She has been also struggling with some left TMJ discomfort.  She had left TMJ surgery in the past.  She states for the last 2 years she has been having bilateral medial and lateral epicondylitis for which she uses braces.  Her feet are stiff in the morning.  None of the other joints are painful.  She has rash over her right ankle and her scalp for which she was seen by dermatologist and was given topical steroid cream.  She was told it was consistent with eczema.  There is no family history of autoimmune disease.  There is no history of psoriasis or psoriatic arthritis in her family.  She is gravida 0.  Activities of Daily Living:  Patient reports morning stiffness for 2-3  hours.   Patient Reports nocturnal pain.  Difficulty dressing/grooming: Reports Difficulty climbing stairs: Denies Difficulty getting out of chair: Denies Difficulty using hands for taps, buttons, cutlery, and/or writing: Denies  Review of Systems   Constitutional: Positive for fatigue. Negative for night sweats, weight gain and weight loss.  HENT: Positive for mouth dryness. Negative for mouth sores, trouble swallowing, trouble swallowing and nose dryness.   Eyes: Negative for pain, redness, itching, visual disturbance and dryness.  Respiratory: Negative for cough, shortness of breath and difficulty breathing.   Cardiovascular: Negative for chest pain, palpitations, hypertension, irregular heartbeat and swelling in legs/feet.  Gastrointestinal: Positive for constipation. Negative for blood in stool and diarrhea.  Endocrine: Negative for increased urination.  Genitourinary: Negative for difficulty urinating and vaginal dryness.  Musculoskeletal: Positive for arthralgias, joint pain, joint swelling, myalgias, morning stiffness, muscle tenderness and myalgias. Negative for muscle weakness.  Skin: Positive for rash. Negative for color change, hair loss, redness, skin tightness, ulcers and sensitivity to sunlight.  Allergic/Immunologic: Negative for susceptible to infections.  Neurological: Positive for numbness and headaches. Negative for dizziness, memory loss, night sweats and weakness.  Hematological: Negative for bruising/bleeding tendency and swollen glands.  Psychiatric/Behavioral: Positive for depressed mood and sleep disturbance. Negative for confusion. The patient is not nervous/anxious.     PMFS History:  Patient Active Problem List   Diagnosis Date Noted  . Pain of right sternoclavicular joint 11/10/2019  . Pain of left thumb 09/15/2018  . Lateral epicondylitis of right elbow 08/09/2018  . Insomnia 10/16/2015  . Hip pain 02/07/2015  . TMJ (dislocation of temporomandibular  joint) 02/07/2015  . 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 Sinusitis 11/17/2013  . Routine general medical examination at a health care facility 08/09/2013  . General medical examination 12/03/2010  . LEUKOCYTOPENIA UNSPECIFIED 03/09/2008  . Depression 03/07/2008  .  Migraine without aura 03/07/2008  . ALLERGIC RHINITIS 03/07/2008  . GERD 03/07/2008  . OSTEOARTHRITIS 03/07/2008  . NECK PAIN, CHRONIC 03/07/2008  . DIVERTICULITIS, HX OF 03/07/2008    Past Medical History:  Diagnosis Date  . Allergic rhinitis   . Depression (emotion)   . DJD (degenerative joint disease)    chronic neck pain  . GERD (gastroesophageal reflux disease)   . History of diverticulitis of colon   . Migraine   . Osteoarthritis     Family History  Problem Relation Age of Onset  . Arthritis Other   . Breast cancer Other   . Coronary artery disease Maternal Grandmother   . Breast cancer Maternal Grandmother        carcinoid tumor in abd  . Hypertension Other   . Diverticulitis Mother   . Arthritis Mother   . Pancreatic cancer Other    Past Surgical History:  Procedure Laterality Date  . APPENDECTOMY    . CERVICAL DISCECTOMY     Dr. Harl Bowie 06/2011[facility]  . HEMORROIDECTOMY    . OVARIAN CYST SURGERY     abdominal surgery for ovarian cyst  . TISSUE GRAFT  02/07/15   pt reports gum graft for receeding gums--Dr Geralynn Ochs  . 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 She lives with her wife in a 2 story home. She drinks 1-2 cups of coffee a day and 1-2 glasses of tea. She exercises regularly.   Immunization History  Administered Date(s) Administered  . Influenza Split 03/24/2017  . Influenza Whole 03/11/2012  . Influenza,inj,Quad PF,6+ Mos 03/17/2018  . Influenza,trivalent, recombinat, inj, PF 03/28/2013  . Influenza-Unspecified 03/29/2014, 03/25/2015, 03/13/2016  . PFIZER SARS-COV-2 Vaccination 06/16/2019, 07/07/2019  . Td 04/05/2007  . Tdap 05/21/2014  . Zoster  Recombinat (Shingrix) 01/17/2018, 03/21/2018     Objective: Vital Signs: BP 122/88 (BP Location: Right Arm, Patient Position: Sitting, Cuff Size: Normal)   Pulse 79   Resp 14   Ht '5\' 7"'$  (1.702 m)   Wt 172 lb 3.2 oz (78.1 kg)   BMI 26.97 kg/m    Physical Exam Vitals and nursing note reviewed.  Constitutional:      Appearance: She is well-developed.  HENT:     Head: Normocephalic and atraumatic.  Eyes:     Conjunctiva/sclera: Conjunctivae normal.  Cardiovascular:     Rate and Rhythm: Normal rate and regular rhythm.     Heart sounds: Normal heart sounds.  Pulmonary:     Effort: Pulmonary effort is normal.     Breath sounds: Normal breath sounds.  Abdominal:     General: Bowel sounds are normal.     Palpations: Abdomen is soft.  Musculoskeletal:     Cervical back: Normal range of motion.  Lymphadenopathy:     Cervical: No cervical adenopathy.  Skin:    General:  Skin is warm and dry.     Capillary Refill: Capillary refill takes less than 2 seconds.     Comments: Dry skin was noted over her right ankle. No psoriasis patches were noted. No nail pitting was noted.  Neurological:     Mental Status: She is alert and oriented to person, place, and time.  Psychiatric:        Behavior: Behavior normal.      Musculoskeletal Exam: She has limited painful range of motion of her cervical spine. She had good range of motion of her lumbar spine. She had tenderness over bilateral SI joints. She has tenderness on palpation over left TMJ. She had tenderness and swelling over right sternoclavicular joint. She has discomfort with range of motion of her bilateral shoulders due to neck pain. Elbow joints, wrist joints, MCPs, PIPs and DIPs with good range of motion with no synovitis. She had tenderness on palpation over bilateral lateral and medial epicondyle region. She had good range of motion of bilateral hip joints, knee joints, ankles, MTPs, PIPs and DIPs with no synovitis. She had tenderness  over bilateral trochanteric bursa.  CDAI Exam: CDAI Score: -- Patient Global: --; Provider Global: -- Swollen: --; Tender: -- Joint Exam 01/16/2020   No joint exam has been documented for this visit   There is currently no information documented on the homunculus. Go to the Rheumatology activity and complete the homunculus joint exam.  Investigation: No additional findings.  Imaging: DG Chest 2 View  Result Date: 01/01/2020 CLINICAL DATA:  Chest pain EXAM: CHEST - 2 VIEW COMPARISON:  12/21/2019 CT chest. FINDINGS: Clear lungs. No pneumothorax or pleural effusion. Cardiomediastinal silhouette within normal limits. No acute or focal osseous abnormalities. Partially imaged ACDF. IMPRESSION: Clear lungs. No focal or acute osseous abnormalities. Electronically Signed   By: Primitivo Gauze M.D.   On: 01/01/2020 11:39   CT CHEST WO CONTRAST  Result Date: 12/22/2019 CLINICAL DATA:  Onset right sternoclavicular joint pain and swelling 11/10/2019. EXAM: CT CHEST WITHOUT CONTRAST TECHNIQUE: Multidetector CT imaging of the chest was performed following the standard protocol without IV contrast. COMPARISON:  None. FINDINGS: Bones/Joint/Cartilage No fracture, dislocation or focal lesion is identified. The patient has a small to moderate right sternoclavicular joint effusion and capsular thickening. No erosion or abnormal calcification is seen about the joint. The left sternoclavicular joint appears normal. Ligaments Suboptimally assessed by CT. Muscles and Tendons Appear normal. Soft tissues Heart size is normal. No lymphadenopathy. Thyroid gland and esophagus appear normal. No pleural or pericardial effusion. Lungs clear. IMPRESSION: Small to moderate right sternoclavicular joint effusion with capsular thickening could be due to crystal, inflammatory or degenerative arthropathy. Septic arthritis is unlikely given absence of secondary bony changes and symptoms for approximately 6 weeks. Electronically  Signed   By: Inge Rise M.D.   On: 12/22/2019 15:46    Recent Labs: Lab Results  Component Value Date   WBC 6.5 01/01/2020   HGB 13.0 01/01/2020   PLT 208 01/01/2020   NA 136 01/01/2020   K 4.0 01/01/2020   CL 103 01/01/2020   CO2 25 01/01/2020   GLUCOSE 91 01/01/2020   BUN 12 01/01/2020   CREATININE 0.91 01/01/2020   BILITOT 0.1 (L) 01/01/2020   ALKPHOS 77 01/01/2020   AST 23 01/01/2020   ALT 19 01/01/2020   PROT 6.9 01/01/2020   ALBUMIN 3.9 01/01/2020   CALCIUM 8.7 (L) 01/01/2020   GFRAA >60 01/01/2020    Speciality Comments: No specialty comments available.  Procedures:  No procedures performed Allergies: Penicillins and Shingrix [zoster vac recomb adjuvanted]   Assessment / Plan:     Visit Diagnoses: Positive ANA (antinuclear antibody) - 09/25/19: ANA 1:40NS, RF<14, TSH 3.41, ESR 25 patient has low titer positive ANA. She denies any history of oral ulcers, nasal ulcers, malar rash, photosensitivity, Raynaud's phenomenon. She gives history of dry mouth. We will check Ro and La antibodies today.  Pain of right sternoclavicular joint -she has pain and swelling in her right sternoclavicular joint. I reviewed the ultrasound and CT scan results. She had synovitis and effusion in her right sternoclavicular joint. These findings could be seen with inflammatory arthritis most likely rheumatoid arthritis or psoriatic arthritis. None of the other joints were swollen today. Although she has been having problems with recurrent lateral and medial epicondylitis and trochanteric bursitis. She also complains of SI joint pain. She had no nail pitting. She had skin rash which was treated with topical steroids for eczema. Have advised her to schedule an appointment with her dermatologist for evaluation of possible psoriasis. There is no family history of psoriasis. I will obtain following labs today. Plan: Cyclic citrul peptide antibody, IgG, 14-3-3 eta Protein, HLA-B27 antigen  Lateral  epicondylitis of both elbows-she has recurrent bilateral lateral and medial epicondylitis. She also uses pipette at the left.  DDD (degenerative disc disease), cervical - Discectomy and cervical fusion at [facility]. She continues to have chronic neck pain.  Chronic SI joint pain -she complains of some SI joint discomfort and stiffness. Plan: XR Pelvis 1-2 Views x-ray of the SI joints showed left SI joint sclerosis.  Trochanteric bursitis of both hips-she has bilateral trochanteric bursitis.  Dry mouth -she has had dry mouth. Which could be related to medication use. Plan: Sjogrens syndrome-A extractable nuclear antibody, Sjogrens syndrome-B extractable nuclear antibody  High risk medication use - Plan: Hepatitis B core antibody, IgM, Hepatitis B surface antigen, Hepatitis C antibody, HIV Antibody (routine testing w rflx), QuantiFERON-TB Gold Plus, Serum protein electrophoresis with reflex, IgG, IgA, IgM  Other fatigue - Plan: CK   Migraine without aura and without status migrainosus, not intractable  History of gastroesophageal reflux (GERD)  History of diverticulitis  History of depression  Other eczema    Patient is fully vaccinated against COVID-19. Use of mask, social distancing and hand hygiene was emphasized.  Orders: Orders Placed This Encounter  Procedures  . XR Pelvis 1-2 Views  . CK  . Cyclic citrul peptide antibody, IgG  . 14-3-3 eta Protein  . Sjogrens syndrome-A extractable nuclear antibody  . Sjogrens syndrome-B extractable nuclear antibody  . HLA-B27 antigen  . Hepatitis B core antibody, IgM  . Hepatitis B surface antigen  . Hepatitis C antibody  . HIV Antibody (routine testing w rflx)  . QuantiFERON-TB Gold Plus  . Serum protein electrophoresis with reflex  . IgG, IgA, IgM   No orders of the defined types were placed in this encounter.    Follow-Up Instructions: Return for Inflammatory arthritis.   Bo Merino, MD  Note - This  record has been created using Editor, commissioning.  Chart creation errors have been sought, but may not always  have been located. Such creation errors do not reflect on  the standard of medical care.

## 2020-01-03 ENCOUNTER — Ambulatory Visit: Payer: PRIVATE HEALTH INSURANCE | Admitting: Family

## 2020-01-03 ENCOUNTER — Other Ambulatory Visit: Payer: Self-pay

## 2020-01-03 ENCOUNTER — Encounter: Payer: Self-pay | Admitting: Family

## 2020-01-03 VITALS — BP 139/88 | HR 88 | Temp 98.6°F | Resp 16 | Ht 67.0 in | Wt 163.4 lb

## 2020-01-03 DIAGNOSIS — R0789 Other chest pain: Secondary | ICD-10-CM | POA: Diagnosis not present

## 2020-01-03 NOTE — Patient Instructions (Signed)
You should be contacted about your referral to cardiology. Please go to the ER if you develop severe/worsening chest pain or shortness of breath.

## 2020-01-03 NOTE — Progress Notes (Signed)
Subjective:    Patient ID: Pamela Sanders, female    DOB: 04/29/1966, 54 y.o.   MRN: [id number]  HPI  Patient is a 54 yr old female who presents today for follow up.  Patient presented to the emergency department on January 01, 2020.  ED note is reviewed in epic.  She had approximately 6-8 episodes of chest pain on the day prior to her visit to the ED in a few the morning of the visit.  EKG performed in the ED did not show any acute changes.  Chest x-ray was normal.  Troponin was negative x2.  Describes as a substernal tightness every few hours.  Since her ER visit she notes that she was exhausted the next day. Took Monday off.  Went to work yesterday.  Had mild chest pressure yesterday.  Working in a cell culture hood.    Has sternoclavicular inflammation on the right. Having a lot of stress at work.  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   + ANA Has appointment with rheumatology in August.    Reports that she tried some Tums on Sunday as well as alka selzer heartburn without improvement. She continues pepcid AC.  Not worsened by position, eating.  She reports that she had some mild sob when she was walking around. Had only mild pain this AM.  She has tried some otc nsaids/tylenol.  Diclofenac helps her shoulder pain but also upsets her stomach. She used some xanax on Monday night which relaxed her and helped her to fall asleep.    Review of Systems See HPI  Past Medical History:  Diagnosis Date  . Allergic rhinitis   . Depression (emotion)   . DJD (degenerative joint disease)    chronic neck pain  . GERD (gastroesophageal reflux disease)   . History of diverticulitis of colon   . Migraine   . Osteoarthritis      Social History   Socioeconomic History  . Marital status: Married    Spouse name: [name]  . Number of children: 0  . Years of education: Not on file  . Highest education level: Doctorate  Occupational History  .  Occupation: Probation officer: [employer]  Tobacco Use  . Smoking status: Never Smoker  . Smokeless tobacco: Never Used  Substance and Sexual Activity  . Alcohol use: Yes    Alcohol/week: 0.0 standard drinks    Comment: 2 drinks weekly, wine  . Drug use: No  . Sexual activity: Not on file  Other Topics Concern  . 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 She lives with her wife in a 2 story home. She drinks 1-2 cups of coffee a day and 1-2 glasses of tea. She exercises regularly.   Social Determinants of Health   Financial Resource Strain:   . Difficulty of Paying Living Expenses:   Food Insecurity:   . Worried About Charity fundraiser in the Last Year:   . Arboriculturist in the Last Year:   [employer]  Needs:   . Lack of Transportation (Medical):   Marland Kitchen Lack of Transportation (Non-Medical):   Physical Activity:   . Days of Exercise per Week:   . Minutes of Exercise per Session:   Stress:   . Feeling of Stress :   Social Connections:   . Frequency of Communication with Friends and Family:   . Frequency of Social Gatherings with Friends and Family:   . Attends Religious Services:   . Active Member of Clubs or Organizations:   . Attends Archivist Meetings:   Marland Kitchen Marital Status:   Intimate Partner Violence:   . Fear of Current or Ex-Partner:   . Emotionally Abused:   Marland Kitchen Physically Abused:   . Sexually Abused:     Past Surgical History:  Procedure Laterality Date  . APPENDECTOMY    . CERVICAL DISCECTOMY     Dr. Harl Bowie 06/2011[facility]  . HEMORROIDECTOMY    . OVARIAN CYST SURGERY     abdominal surgery for ovarian cyst  . TISSUE GRAFT  02/07/15   pt reports gum graft for  receeding gums--Dr Geralynn Ochs  . TMJ ARTHROSCOPY     TMJ surgery    Family History  Problem Relation Age of Onset  . Arthritis Other   . Breast cancer Other   . Coronary artery disease Maternal Grandmother   . Breast cancer Maternal Grandmother        carcinoid tumor in abd  . Hypertension Other   . Diverticulitis Mother   . Arthritis Mother   . Pancreatic cancer Other     Allergies  Allergen Reactions  . Penicillins Hives  . Shingrix [Zoster Vac Recomb Adjuvanted]     Fever, myalgia, local redness    Current Outpatient Medications on File Prior to Visit  Medication Sig Dispense Refill  . ALPRAZolam (XANAX) 0.25 MG tablet TAKE 1 TABLET BY MOUTH TWO TIMES DAILY AS NEEDED FOR ANXIETY (Patient taking differently: Take 0.25 mg by mouth 2 (two) times daily as needed for anxiety. ) 30 tablet 0  . aspirin EC 81 MG tablet Take 81 mg by mouth daily as needed (chest pain). Swallow whole.    . augmented betamethasone dipropionate (DIPROLENE) 0.05 % ointment Apply topically 2 (two) times daily. (Patient taking differently: Apply 1 application topically 2 (two) times daily as needed (redness/swelling). ) 30 g 0  . b complex vitamins capsule Take 1 capsule by mouth daily.     . calcium citrate-vitamin D (CITRACAL+D) 315-200 MG-UNIT per tablet Take 1 tablet by mouth 2 (two) times daily.      . diclofenac (VOLTAREN) 75 MG EC tablet Take 1 tablet (75 mg total) by mouth 2 (two) times daily as needed. (Patient taking differently: Take 75 mg by mouth 2 (two) times daily as needed (pain). ) 30 tablet 0  . Diclofenac Sodium (PENNSAID) 2 % SOLN Place 1 application onto the skin 2 (two) times daily. (Patient taking differently: Place 1 application onto the skin 2 (two) times daily as needed (pain). ) 1 Bottle 3  . diphenhydrAMINE (BENADRYL) 25 MG tablet Take 25 mg by mouth daily as needed for itching or allergies.     . famotidine (PEPCID AC) 10 MG chewable tablet Chew 10 mg by mouth 2 (two) times daily.    Marland Kitchen  gabapentin (NEURONTIN) 300 MG capsule Take 2 capsules (600 mg total) by mouth 2 (two) times daily. 360 capsule 1  . loratadine (CLARITIN) 10 MG tablet Take 10 mg by mouth daily.      Marland Kitchen  Melatonin 5 MG CAPS Take 5 mg by mouth at bedtime as needed (sleep).     . Multiple Vitamins-Minerals (CENTRUM PO) Take 1 tablet by mouth daily.     . WELLBUTRIN XL 150 MG 24 hr tablet TAKE TWO TABLETS DAILY (Patient taking differently: Take 300 mg by mouth daily. ) 180 tablet 1  . zolpidem (AMBIEN) 5 MG tablet TAKE 1 TABLET BY MOUTH AT BEDTIME AS NEEDED FOR SLEEP (Patient taking differently: Take 5 mg by mouth at bedtime as needed for sleep. ) 30 tablet 0   No current facility-administered medications on file prior to visit.    BP (!) 139/88 (BP Location: Right Arm, Patient Position: Sitting, Cuff Size: Small)   Pulse 88   Temp 98.6 F (37 C) (Oral)   Resp 16   Ht 5\' 7"  (1.702 m)   Wt 163 lb 6.4 oz (74.1 kg)   SpO2 100%   BMI 25.59 kg/m       Objective:   Physical Exam Constitutional:      Appearance: She is well-developed.  Cardiovascular:     Rate and Rhythm: Normal rate and regular rhythm.     Heart sounds: Normal heart sounds. No murmur heard.   Pulmonary:     Effort: Pulmonary effort is normal. No respiratory distress.     Breath sounds: Normal breath sounds. No wheezing.  Abdominal:     General: Bowel sounds are normal.     Palpations: Abdomen is soft.     Tenderness: There is no abdominal tenderness.  Musculoskeletal:     Comments: R clavicle is more prominent than left + tenderness to palpation beneath right clavicle and extending across the right anterior chest wall toward sternum  Psychiatric:        Behavior: Behavior normal.        Thought Content: Thought content normal.        Judgment: Judgment normal.           Assessment & Plan:  Atypical chest pain- I suspect that her pain is musculoskeletal in nature.  She has GI upset with NSAIDS.  I suggested application of  salon pas patch to the tender area beneath her right clavicle prn as well as tylenol. She has known + ANA and she has an upcoming consultation scheduled with Rheumatology. She is also following with sports medicine and had a recent CT chest which noted:  Small to moderate right sternoclavicular joint effusion with capsular thickening could be due to crystal, inflammatory or degenerative arthropathy.  For completeness sake, will also refer her to cardiology to exclude any underlying cardiac etiology.   This visit occurred during the SARS-CoV-2 public health emergency.  Safety protocols were in place, including screening questions prior to the visit, additional usage of staff PPE, and extensive cleaning of exam room while observing appropriate contact time as indicated for disinfecting solutions.

## 2020-01-10 ENCOUNTER — Other Ambulatory Visit: Payer: Self-pay

## 2020-01-10 ENCOUNTER — Telehealth (INDEPENDENT_AMBULATORY_CARE_PROVIDER_SITE_OTHER): Payer: PRIVATE HEALTH INSURANCE | Admitting: Family Medicine

## 2020-01-10 DIAGNOSIS — M25511 Pain in right shoulder: Secondary | ICD-10-CM | POA: Diagnosis not present

## 2020-01-10 NOTE — Progress Notes (Signed)
Virtual Visit via Video Note  I connected with Pamela Sanders on 01/10/20 at  8:10 AM EDT by a video enabled telemedicine application and verified that I am speaking with the correct person using two identifiers.   I discussed the limitations of evaluation and management by telemedicine and the availability of in person appointments. The patient expressed understanding and agreed to proceed.  Patient: home  Physician: office.   History of Present Illness:  Ms. Pamela Sanders is a 54 year old female that is following up for her right sternoclavicular joint.  A CT was conducted which showed effusion on the right sternoclavicular joint with capsular thickening.  Possible for crystal or inflammatory degenerative arthropathy.   Observations/Objective:  Gen: NAD, alert, cooperative with exam, well-appearing  Assessment and Plan:  Right sternoclavicular joint pai.: Recent CT is showing capsular thickening with unclear origin.  She did have a positive ANA recently.  She has appoint with rheumatology upcoming. -Counseled supportive care. -Could consider injection and aspiration if no improvement.  Follow Up Instructions:    I discussed the assessment and treatment plan with the patient. The patient was provided an opportunity to ask questions and all were answered. The patient agreed with the plan and demonstrated an understanding of the instructions.   The patient was advised to call back or seek an in-person evaluation if the symptoms worsen or if the condition fails to improve as anticipated.   Clearance Coots, MD

## 2020-01-10 NOTE — Assessment & Plan Note (Signed)
Recent CT is showing capsular thickening with unclear origin.  She did have a positive ANA recently.  She has appoint with rheumatology upcoming. -Counseled supportive care. -Could consider injection and aspiration if no improvement.

## 2020-01-16 ENCOUNTER — Ambulatory Visit: Payer: PRIVATE HEALTH INSURANCE | Admitting: Rheumatology

## 2020-01-16 ENCOUNTER — Ambulatory Visit: Payer: Self-pay

## 2020-01-16 ENCOUNTER — Other Ambulatory Visit: Payer: Self-pay

## 2020-01-16 ENCOUNTER — Encounter: Payer: Self-pay | Admitting: Rheumatology

## 2020-01-16 VITALS — BP 122/88 | HR 79 | Resp 14 | Ht 67.0 in | Wt 172.2 lb

## 2020-01-16 DIAGNOSIS — M25511 Pain in right shoulder: Secondary | ICD-10-CM | POA: Diagnosis not present

## 2020-01-16 DIAGNOSIS — G8929 Other chronic pain: Secondary | ICD-10-CM

## 2020-01-16 DIAGNOSIS — R682 Dry mouth, unspecified: Secondary | ICD-10-CM

## 2020-01-16 DIAGNOSIS — M5030 Other cervical disc degeneration, unspecified cervical region: Secondary | ICD-10-CM

## 2020-01-16 DIAGNOSIS — G43009 Migraine without aura, not intractable, without status migrainosus: Secondary | ICD-10-CM

## 2020-01-16 DIAGNOSIS — R7689 Other specified abnormal immunological findings in serum: Secondary | ICD-10-CM

## 2020-01-16 DIAGNOSIS — M533 Sacrococcygeal disorders, not elsewhere classified: Secondary | ICD-10-CM | POA: Diagnosis not present

## 2020-01-16 DIAGNOSIS — Z8659 Personal history of other mental and behavioral disorders: Secondary | ICD-10-CM

## 2020-01-16 DIAGNOSIS — L308 Other specified dermatitis: Secondary | ICD-10-CM

## 2020-01-16 DIAGNOSIS — M7062 Trochanteric bursitis, left hip: Secondary | ICD-10-CM

## 2020-01-16 DIAGNOSIS — R5383 Other fatigue: Secondary | ICD-10-CM

## 2020-01-16 DIAGNOSIS — M7712 Lateral epicondylitis, left elbow: Secondary | ICD-10-CM

## 2020-01-16 DIAGNOSIS — Z79899 Other long term (current) drug therapy: Secondary | ICD-10-CM

## 2020-01-16 DIAGNOSIS — R768 Other specified abnormal immunological findings in serum: Secondary | ICD-10-CM

## 2020-01-16 DIAGNOSIS — M7711 Lateral epicondylitis, right elbow: Secondary | ICD-10-CM | POA: Diagnosis not present

## 2020-01-16 DIAGNOSIS — M7061 Trochanteric bursitis, right hip: Secondary | ICD-10-CM

## 2020-01-16 DIAGNOSIS — Z8719 Personal history of other diseases of the digestive system: Secondary | ICD-10-CM

## 2020-01-16 NOTE — Patient Instructions (Signed)
   COVID-19 vaccine recommendations:   COVID-19 vaccine is recommended for everyone (unless you are allergic to a vaccine component), even if you are on a medication that suppresses your immune system.   If you are on Methotrexate, Cellcept (mycophenolate), Rinvoq, Xeljanz, and Olumiant- hold the medication for 1 week after each vaccine. Hold Methotrexate for 2 weeks after the single dose COVID-19 vaccine.   If you are on Orencia subcutaneous injection - hold medication one week prior to and one week after the first COVID-19 vaccine dose (only).   If you are on Orencia IV infusions- time vaccination administration so that the first COVID-19 vaccination will occur four weeks after the infusion and postpone the subsequent infusion by one week.   If you are on Cyclophosphamide or Rituxan infusions please contact your doctor prior to receiving the COVID-19 vaccine.   Do not take Tylenol or ant anti-inflammatory medications (NSAIDs) 24 hours prior to the COVID-19 vaccination.   There is no direct evidence about the efficacy of the COVID-19 vaccine in individuals who are on medications that suppress the immune system.   Even if you are fully vaccinated, and you are on any medications that suppress your immune system, please continue to wear a mask, maintain at least six feet social distance and practice hand hygiene.   If you develop a COVID-19 infection, please contact your PCP or our office to determine if you need antibody infusion.  We anticipate that a booster vaccine will be available soon for immunosuppressed individuals. Please cal our office before receiving your booster dose to make adjustments to your medication regimen.  

## 2020-01-16 NOTE — Telephone Encounter (Signed)
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  Please a schedule MRI of the SI joints to rule out sacroiliitis.  Please get approval from the patient's prior to scheduling the procedure.

## 2020-01-17 NOTE — Telephone Encounter (Signed)
MRI order placed per patient agreement to plan.

## 2020-01-19 NOTE — Progress Notes (Signed)
I will discuss results at the follow-up visit.

## 2020-01-26 LAB — HLA-B27 ANTIGEN: HLA-B27 Antigen: NEGATIVE

## 2020-01-26 LAB — QUANTIFERON-TB GOLD PLUS
Mitogen-NIL: 7.95 IU/mL
NIL: 0.05 IU/mL
QuantiFERON-TB Gold Plus: NEGATIVE
TB1-NIL: 0.03 IU/mL
TB2-NIL: 0.04 IU/mL

## 2020-01-26 LAB — PROTEIN ELECTROPHORESIS, SERUM, WITH REFLEX
Albumin ELP: 3.9 g/dL (ref 3.8–4.8)
Alpha 1: 0.3 g/dL (ref 0.2–0.3)
Alpha 2: 0.6 g/dL (ref 0.5–0.9)
Beta 2: 0.3 g/dL (ref 0.2–0.5)
Beta Globulin: 0.4 g/dL (ref 0.4–0.6)
Gamma Globulin: 0.9 g/dL (ref 0.8–1.7)
Total Protein: 6.4 g/dL (ref 6.1–8.1)

## 2020-01-26 LAB — HEPATITIS C ANTIBODY
Hepatitis C Ab: NONREACTIVE
SIGNAL TO CUT-OFF: 0.02 (ref <1.00)

## 2020-01-26 LAB — IGG, IGA, IGM
IgG (Immunoglobin G), Serum: 985 mg/dL (ref 600–1640)
IgM, Serum: 91 mg/dL (ref 50–300)
Immunoglobulin A: 178 mg/dL (ref 47–310)

## 2020-01-26 LAB — HEPATITIS B CORE ANTIBODY, IGM: Hep B C IgM: NONREACTIVE

## 2020-01-26 LAB — SJOGRENS SYNDROME-A EXTRACTABLE NUCLEAR ANTIBODY: SSA (Ro) (ENA) Antibody, IgG: <1 AI

## 2020-01-26 LAB — 14-3-3 ETA PROTEIN: 14-3-3 eta Protein: <0.2 ng/mL (ref <0.2)

## 2020-01-26 LAB — HEPATITIS B SURFACE ANTIGEN: Hepatitis B Surface Ag: NONREACTIVE

## 2020-01-26 LAB — SJOGRENS SYNDROME-B EXTRACTABLE NUCLEAR ANTIBODY: SSB (La) (ENA) Antibody, IgG: <1 AI

## 2020-01-26 LAB — HIV ANTIBODY (ROUTINE TESTING W REFLEX): HIV 1&2 Ab, 4th Generation: NONREACTIVE

## 2020-01-26 LAB — CYCLIC CITRUL PEPTIDE ANTIBODY, IGG: Cyclic Citrullin Peptide Ab: <16 UNITS

## 2020-01-26 LAB — CK: Total CK: 44 U/L (ref 29–143)

## 2020-01-29 ENCOUNTER — Other Ambulatory Visit: Payer: Self-pay

## 2020-01-29 ENCOUNTER — Ambulatory Visit: Payer: PRIVATE HEALTH INSURANCE | Admitting: Cardiology

## 2020-01-29 ENCOUNTER — Encounter: Payer: Self-pay | Admitting: Rheumatology

## 2020-01-29 DIAGNOSIS — R072 Precordial pain: Secondary | ICD-10-CM

## 2020-01-29 DIAGNOSIS — R0789 Other chest pain: Secondary | ICD-10-CM

## 2020-01-29 HISTORY — DX: Other chest pain: R07.89

## 2020-01-29 NOTE — Addendum Note (Signed)
Addended by: Rockney Grenz, Jonelle Sidle L on: 01/29/2020 01:18 PM   Modules accepted: Orders

## 2020-01-29 NOTE — Patient Instructions (Signed)
Medication Instructions:  No medication changes. *If you need a refill on your cardiac medications before your next appointment, please call your pharmacy*   Lab Work: None ordered If you have labs (blood work) drawn today and your tests are completely normal, you will receive your results only by: Marland Kitchen MyChart Message (if you have MyChart) OR . A paper copy in the mail If you have any lab test that is abnormal or we need to change your treatment, we will call you to review the results.   Testing/Procedures: We will order CT coronary calcium score $150  Please call [phone] to schedule   [facility]  [address] [place], [place] [address]  Your physician has requested that you have a lexiscan myoview. For further information please visit [email or web address]. Please follow instruction sheet, as given.  The test will take approximately 3 to 4 hours to complete; you may bring reading material.  If someone comes with you to your appointment, they will need to remain in the main lobby due to limited space in the testing area. **If you are pregnant or breastfeeding, please notify the nuclear lab prior to your appointment**  How to prepare for your Myocardial Perfusion Test: . Do not eat or drink 3 hours prior to your test, except you may have water. . Do not consume products containing caffeine (regular or decaffeinated) 12 hours prior to your test. (ex: coffee, chocolate, sodas, tea). . Do bring a list of your current medications with you.  If not listed below, you may take your medications as normal. . Do wear comfortable clothes (no dresses or overalls) and walking shoes, tennis shoes preferred (No heels or open toe shoes are allowed). . Do NOT wear cologne, perfume, aftershave, or lotions (deodorant is allowed). . If these instructions are not followed, your test will have to be rescheduled.   Follow-Up: At [facility] ([place] you and your health needs are our priority.   As part of our continuing mission to provide you with exceptional heart care, we have created designated Provider Care Teams.  These Care Teams include your primary Cardiologist (physician) and Advanced Practice Providers (APPs -  Physician Assistants and Nurse Practitioners) who all work together to provide you with the care you need, when you need it.  We recommend signing up for the patient portal called "MyChart".  Sign up information is provided on this After Visit Summary.  MyChart is used to connect with patients for Virtual Visits (Telemedicine).  Patients are able to view lab/test results, encounter notes, upcoming appointments, etc.  Non-urgent messages can be sent to your provider as well.   To learn more about what you can do with MyChart, go to [email or web address].    Your next appointment:   6 month(s)  The format for your next appointment:   In Person  Provider:   Jyl Heinz, MD   Other Instructions  Coronary Calcium Scan A coronary calcium scan is an imaging test used to look for deposits of plaque in the inner lining of the blood vessels of the heart (coronary arteries). Plaque is made up of calcium, protein, and fatty substances. These deposits of plaque can partly clog and narrow the coronary arteries without producing any symptoms or warning signs. This puts a person at risk for a heart attack. This test is recommended for people who are at moderate risk for heart disease. The test can find plaque deposits before symptoms develop. Tell a health care provider about:  Any allergies you have.  All medicines you are taking, including vitamins, herbs, eye drops, creams, and over-the-counter medicines.  Any problems you or family members have had with anesthetic medicines.  Any blood disorders you have.  Any surgeries you have had.  Any medical conditions you have.  Whether you are pregnant or may be pregnant. What are the risks? Generally, this is a safe  procedure. However, problems may occur, including:  Harm to a pregnant woman and her unborn baby. This test involves the use of radiation. Radiation exposure can be dangerous to a pregnant woman and her unborn baby. If you are pregnant or think you may be pregnant, you should not have this procedure done.  Slight increase in the risk of cancer. This is because of the radiation involved in the test. What happens before the procedure? Ask your health care provider for any specific instructions on how to prepare for this procedure. You may be asked to avoid products that contain caffeine, tobacco, or nicotine for 4 hours before the procedure. What happens during the procedure?   You will undress and remove any jewelry from your neck or chest.  You will put on a hospital gown.  Sticky electrodes will be placed on your chest. The electrodes will be connected to an electrocardiogram (ECG) machine to record a tracing of the electrical activity of your heart.  You will lie down on a curved bed that is attached to the [place].  You may be given medicine to slow down your heart rate so that clear pictures can be created.  You will be moved into the CT scanner, and the CT scanner will take pictures of your heart. During this time, you will be asked to lie still and hold your breath for 2-3 seconds at a time while each picture of your heart is being taken. The procedure may vary among health care providers and hospitals. What happens after the procedure?  You can get dressed.  You can return to your normal activities.  It is up to you to get the results of your procedure. Ask your health care provider, or the department that is doing the procedure, when your results will be ready. Summary  A coronary calcium scan is an imaging test used to look for deposits of plaque in the inner lining of the blood vessels of the heart (coronary arteries). Plaque is made up of calcium, protein, and fatty  substances.  Generally, this is a safe procedure. Tell your health care provider if you are pregnant or may be pregnant.  Ask your health care provider for any specific instructions on how to prepare for this procedure.  A CT scanner will take pictures of your heart.  You can return to your normal activities after the scan is done. This information is not intended to replace advice given to you by your health care provider. Make sure you discuss any questions you have with your health care provider. Document Revised: 12/13/2018 Document Reviewed: 12/13/2018 Elsevier Patient Education  2020 [place].  Cardiac Nuclear Scan A cardiac nuclear scan is a test that is done to check the flow of blood to your heart. It is done when you are resting and when you are exercising. The test looks for problems such as:  Not enough blood reaching a portion of the heart.  The heart muscle not working as it should. You may need this test if:  You have heart disease.  You have had lab results  that are not normal.  You have had heart surgery or a balloon procedure to open up blocked arteries (angioplasty).  You have chest pain.  You have shortness of breath. In this test, a special dye (tracer) is put into your bloodstream. The tracer will travel to your heart. A camera will then take pictures of your heart to see how the tracer moves through your heart. This test is usually done at a hospital and takes 2-4 hours. Tell a doctor about:  Any allergies you have.  All medicines you are taking, including vitamins, herbs, eye drops, creams, and over-the-counter medicines.  Any problems you or family members have had with anesthetic medicines.  Any blood disorders you have.  Any surgeries you have had.  Any medical conditions you have.  Whether you are pregnant or may be pregnant. What are the risks? Generally, this is a safe test. However, problems may occur, such as:  Serious chest pain  and heart attack. This is only a risk if the stress portion of the test is done.  Rapid heartbeat.  A feeling of warmth in your chest. This feeling usually does not last long.  Allergic reaction to the tracer. What happens before the test?  Ask your doctor about changing or stopping your normal medicines. This is important.  Follow instructions from your doctor about what you cannot eat or drink.  Remove your jewelry on the day of the test. What happens during the test?  An IV tube will be inserted into one of your veins.  Your doctor will give you a small amount of tracer through the IV tube.  You will wait for 20-40 minutes while the tracer moves through your bloodstream.  Your heart will be monitored with an electrocardiogram (ECG).  You will lie down on an exam table.  Pictures of your heart will be taken for about 15-20 minutes.  You may also have a stress test. For this test, one of these things may be done: ? You will be asked to exercise on a treadmill or a stationary bike. ? You will be given medicines that will make your heart work harder. This is done if you are unable to exercise.  When blood flow to your heart has peaked, a tracer will again be given through the IV tube.  After 20-40 minutes, you will get back on the exam table. More pictures will be taken of your heart.  Depending on the tracer that is used, more pictures may need to be taken 3-4 hours later.  Your IV tube will be removed when the test is over. The test may vary among doctors and hospitals. What happens after the test?  Ask your doctor: ? Whether you can return to your normal schedule, including diet, activities, and medicines. ? Whether you should drink more fluids. This will help to remove the tracer from your body. Drink enough fluid to keep your pee (urine) pale yellow.  Ask your doctor, or the department that is doing the test: ? When will my results be ready? ? How will I get my  results? Summary  A cardiac nuclear scan is a test that is done to check the flow of blood to your heart.  Tell your doctor whether you are pregnant or may be pregnant.  Before the test, ask your doctor about changing or stopping your normal medicines. This is important.  Ask your doctor whether you can return to your normal activities. You may be asked to drink  more fluids. This information is not intended to replace advice given to you by your health care provider. Make sure you discuss any questions you have with your health care provider. Document Revised: 09/14/2018 Document Reviewed: 11/08/2017 Elsevier Patient Education  [place].

## 2020-01-29 NOTE — Progress Notes (Signed)
Cardiology Office Note:    Date:  01/29/2020   ID:  Pamela Sanders, DOB [birth year], MRN [id number]  PCP:  [name], NP  Cardiologist:  Jenean Lindau, MD   Referring MD: [name], NP    ASSESSMENT:    1. Chest tightness    PLAN:    In order of problems listed above:  1. Primary prevention stressed with the patient.  Importance of compliance with diet medication stressed and she vocalized understanding. 2. Chest tightness: Patient symptoms are concerning and therefore we will schedule her for Lexiscan sestamibi.  Further recommendations will be made based on the findings of this test.  She knows to go to the nearest emergency room for any concerning symptoms. 3. She wants coronary risk stratification information.  Her lipids are fine.  We will do a calcium score FFR. 4. Patient will be seen in follow-up appointment in 6 months or earlier if the patient has any concerns.  Patient had multiple questions which were answered to her satisfaction.    Medication Adjustments/Labs and Tests Ordered: Current medicines are reviewed at length with the patient today.  Concerns regarding medicines are outlined above.  No orders of the defined types were placed in this encounter.  No orders of the defined types were placed in this encounter.    History of Present Illness:    Pamela Sanders is a 54 y.o. female who is being seen today for the evaluation of chest tightness at the request of [name], NP.  Patient is a pleasant 54 year old female.  She has past medical history that is not very significant.  She denies any history of essential hypertension dyslipidemia or diabetes mellitus.  No family history of coronary artery disease.  She mentions to me that about a month ago she had an episode of chest tightness as she was resting.  She leads a sedentary lifestyle.  No radiation of the symptoms to the neck or the arms.  At the time of my evaluation, the patient is  alert awake oriented and in no distress.  She wanted the symptoms to be evaluated and was referred here.  Past Medical History:  Diagnosis Date  . Allergic rhinitis   . Depression   . Depression (emotion)   . DJD (degenerative joint disease)    chronic neck pain  . GERD (gastroesophageal reflux disease)   . Hip pain   . History of diverticulitis of colon   . Hx of diverticulitis of colon   . Insomnia   . Lateral epicondylitis of right elbow   . Leukocytopenia   . Migraine   . Neck pain, chronic   . Osteoarthritis   . Pain of left thumb   . Pain of right sternoclavicular joint   . Phantosmia   . Sinusitis   . TMJ (dislocation of temporomandibular joint)     Past Surgical History:  Procedure Laterality Date  . APPENDECTOMY    . CERVICAL DISCECTOMY     Dr. Harl [facility] 06/2011[facility]  . HEMORROIDECTOMY    . OVARIAN CYST SURGERY     abdominal surgery for ovarian cyst  . TISSUE GRAFT  02/07/15   pt reports gum graft for receeding gums--Dr Geralynn Ochs  . TMJ ARTHROSCOPY     TMJ surgery    Current Medications: Current Meds  Medication Sig  . ALPRAZolam (XANAX) 0.25 MG tablet Take 0.25 mg by mouth as needed.   Marland Kitchen aspirin EC 81 MG tablet Take 81 mg by mouth daily as needed (  chest pain). Swallow whole.  . augmented betamethasone dipropionate (DIPROLENE-AF) 0.05 % ointment Apply 1 application topically as needed.   Marland Kitchen b complex vitamins capsule Take 1 capsule by mouth daily.   . calcium citrate-vitamin D (CITRACAL+D) 315-200 MG-UNIT per tablet Take 2 tablets by mouth daily.   . Diclofenac Sodium (PENNSAID) 2 % SOLN Place 1 application onto the skin 2 (two) times daily.  . diphenhydrAMINE (BENADRYL) 25 MG tablet Take 25 mg by mouth daily as needed for itching or allergies.   . famotidine (PEPCID AC) 10 MG chewable tablet Chew 10 mg by mouth 2 (two) times daily.  Marland Kitchen gabapentin (NEURONTIN) 300 MG capsule Take 2 capsules (600 mg total) by mouth 2 (two) times daily.  Marland Kitchen loratadine (CLARITIN) 10 MG  tablet Take 10 mg by mouth daily.    . Melatonin 5 MG CAPS Take 5 mg by mouth at bedtime as needed (sleep).   . Multiple Vitamins-Minerals (CENTRUM PO) Take 1 tablet by mouth daily.   . norethindrone (AYGESTIN) 5 MG tablet Take 10 mg by mouth as directed.   . WELLBUTRIN XL 150 MG 24 hr tablet TAKE TWO TABLETS DAILY  . zolpidem (AMBIEN) 5 MG tablet TAKE 1 TABLET BY MOUTH AT BEDTIME AS NEEDED FOR SLEEP     Allergies:   Penicillins and Shingrix [zoster vac recomb adjuvanted]   Social History   Socioeconomic History  . Marital status: Married    Spouse name: [name]  . Number of children: 0  . Years of education: Not on file  . Highest education level: Doctorate  Occupational History  . Occupation: Probation officer: [employer]  Tobacco Use  . Smoking status: Never Smoker  . Smokeless tobacco: Never Used  Vaping Use  . Vaping Use: Never used  Substance and Sexual Activity  . Alcohol use: Yes    Alcohol/week: 0.0 standard drinks    Comment: 2 drinks weekly, wine  . Drug use: No  . Sexual activity: Not on file  Other Topics Concern  . 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 She lives with her wife in a 2 story home. She drinks 1-2 cups of coffee a day and 1-2 glasses of tea. She exercises regularly.   Social Determinants of Health   Financial Resource Strain:   . Difficulty of Paying Living Expenses: Not on file  Food Insecurity:   . Worried About Charity fundraiser in the Last Year: Not on file  . Ran Out of Food in the Last Year: Not on file  Transportation Needs:   . Lack of Transportation (Medical): Not on file  . Lack of Transportation (Non-Medical): Not on file  Physical  Activity:   . Days of Exercise per Week: Not on file  . Minutes of Exercise per Session: Not on file  Stress:   . Feeling of Stress : Not on file  Social Connections:   . Frequency of Communication with Friends and Family: Not on file  . Frequency of Social Gatherings with Friends and Family: Not on file  . Attends Religious Services: Not  on file  . Active Member of Clubs or Organizations: Not on file  . Attends Archivist Meetings: Not on file  . Marital Status: Not on file     Family History: The patient's family history includes Arthritis in her mother and another family member; Breast cancer in her maternal grandmother and another family member; Coronary artery disease in her maternal grandmother; Diverticulitis in her mother; Hypertension in an other family member; Pancreatic cancer in an other family member.  ROS:   Please see the history of present illness.    All other systems reviewed and are negative.  EKGs/Labs/Other Studies Reviewed:    The following studies were reviewed today: EKG reveals sinus rhythm and nonspecific ST-T changes.   Recent Labs: 09/25/2019: TSH 3.41 01/01/2020: ALT 19; BUN 12; Creatinine, Ser 0.91; Hemoglobin 13.0; Platelets 208; Potassium 4.0; Sodium 136  Recent Lipid Panel    Component Value Date/Time   CHOL 175 09/25/2019 1123   TRIG 157.0 (H) 09/25/2019 1123   HDL 44.40 09/25/2019 1123   CHOLHDL 4 09/25/2019 1123   VLDL 31.4 09/25/2019 1123   LDLCALC 99 09/25/2019 1123    Physical Exam:    VS:  BP 122/74   Pulse 72   Ht 5\' 7"  (1.702 m)   Wt 168 lb 0.6 oz (76.2 kg)   SpO2 99%   BMI 26.32 kg/m     Wt Readings from Last 3 Encounters:  01/29/20 168 lb 0.6 oz (76.2 kg)  01/16/20 172 lb 3.2 oz (78.1 kg)  01/03/20 163 lb 6.4 oz (74.1 kg)     GEN: Patient is in no acute distress HEENT: Normal NECK: No JVD; No carotid bruits LYMPHATICS: No lymphadenopathy CARDIAC: S1 S2 regular, 2/6 systolic murmur at the  apex. RESPIRATORY:  Clear to auscultation without rales, wheezing or rhonchi  ABDOMEN: Soft, non-tender, non-distended MUSCULOSKELETAL:  No edema; No deformity  SKIN: Warm and dry NEUROLOGIC:  Alert and oriented x 3 PSYCHIATRIC:  Normal affect    Signed, Jenean Lindau, MD  01/29/2020 11:15 AM    [place]

## 2020-02-01 NOTE — Progress Notes (Signed)
Office Visit Note  Patient: Pamela Sanders             Date of Birth: October 02, 1965           MRN: [id number]             PCP: [name], NP Referring: [name], NP Visit Date: 02/06/2020 Occupation: _0 @  Subjective:  Right sternoclavicular joint pain and swelling.   History of Present Illness: Pamela Sanders is a 54 y.o. female with history of right sternoclavicular joint pain and swelling.  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  She had MRI of her SI joints which was unremarkable.  She continues to have lateral epicondylitis and bilateral trochanteric bursitis.  She states her left trochanteric bursae is very painful to the point she is having difficulty walking and doing routine activities.  She is requesting a cortisone injection.  She states she had SI joint injection in the past which was very helpful.  She denies any history of psoriasis or rash.  Activities of Daily Living:  Patient reports morning stiffness for 10  minutes.   Patient Reports nocturnal pain.  Difficulty dressing/grooming: Denies Difficulty climbing stairs: Denies Difficulty getting out of chair: Denies Difficulty using hands for taps, buttons, cutlery, and/or writing: Denies  Review of Systems  Constitutional: Negative for fatigue.  HENT: Positive for mouth dryness. Negative for mouth sores and nose dryness.   Eyes: Negative for itching and dryness.  Respiratory: Negative for shortness of breath and difficulty breathing.   Cardiovascular: Negative for chest pain and palpitations.  Gastrointestinal: Positive for constipation. Negative for blood in stool and diarrhea.  Endocrine: Negative for increased urination.  Genitourinary: Negative for difficulty urinating.  Musculoskeletal: Positive for arthralgias, joint pain, joint swelling and morning stiffness. Negative for myalgias, muscle tenderness and myalgias.  Skin: Negative for  color change, rash and redness.  Allergic/Immunologic: Negative for susceptible to infections.  Neurological: Positive for numbness. Negative for dizziness, headaches, memory loss and weakness.  Hematological: Negative for bruising/bleeding tendency.  Psychiatric/Behavioral: Positive for sleep disturbance. Negative for confusion.    PMFS History:  Patient Active Problem List   Diagnosis Date Noted  . Chest tightness 01/29/2020  . Chest pain 12/31/2019  . Sternoclavicular joint pain, right 10/15/2019  . Pain of left thumb 09/15/2018  . Lateral epicondylitis of right elbow 08/09/2018  . Insomnia 10/16/2015  . Hip pain 02/07/2015  . TMJ (dislocation of temporomandibular joint) 02/07/2015  . 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 Sinusitis 11/17/2013  . Routine general medical examination at a health care facility 08/09/2013  . General medical examination 12/03/2010  . 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 Depression 03/07/2008  . Migraine without aura 03/07/2008  . Allergic rhinitis 03/07/2008  . GERD 03/07/2008  . Osteoarthritis 03/07/2008  . NECK PAIN, CHRONIC 03/07/2008  . DIVERTICULITIS, HX OF 03/07/2008    Past Medical History:  Diagnosis Date  . Allergic rhinitis   . Depression   . Depression (emotion)   . DJD (degenerative joint disease)    chronic neck pain  . GERD (gastroesophageal reflux disease)   . Hip pain   . History of diverticulitis of colon   . Hx of diverticulitis of colon   . Insomnia   . Lateral epicondylitis of right elbow   . Leukocytopenia   . Migraine   . Neck pain, chronic   . Osteoarthritis   . Pain of left thumb   . Pain of right  sternoclavicular joint   . Phantosmia   . Sinusitis   . TMJ (dislocation of temporomandibular joint)     Family History  Problem Relation Age of Onset  . Arthritis Other   . Breast cancer Other   . Coronary artery disease Maternal Grandmother   . Breast cancer Maternal Grandmother        carcinoid tumor in abd  . Hypertension  Other   . Diverticulitis Mother   . Arthritis Mother   . Pancreatic cancer Other    Past Surgical History:  Procedure Laterality Date  . APPENDECTOMY    . CERVICAL DISCECTOMY     [facility]  . HEMORROIDECTOMY    . OVARIAN CYST SURGERY     abdominal surgery for ovarian cyst  . TISSUE GRAFT  02/07/15   pt reports gum graft for receeding gums--Dr Geralynn Ochs  . 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 She lives with her wife in a 2 story home. She drinks 1-2 cups of coffee a day and 1-2 glasses of tea. She exercises regularly.   Immunization History  Administered Date(s) Administered  . Influenza Split 03/24/2017  . Influenza Whole 03/11/2012  . Influenza,inj,Quad PF,6+ Mos 03/17/2018  . Influenza,trivalent, recombinat, inj, PF 03/28/2013  . Influenza-Unspecified 03/29/2014, 03/25/2015, 03/13/2016  . PFIZER SARS-COV-2 Vaccination 06/16/2019, 07/07/2019  . Td 04/05/2007  . Tdap 05/21/2014  . Zoster Recombinat (Shingrix) 01/17/2018, 03/21/2018     Objective: Vital Signs: BP 134/89 (BP Location: Left Arm, Patient Position: Sitting, Cuff Size: Normal)   Pulse 83   Resp 15   Ht _0  (1.702 m)   Wt 170 lb 9.6 oz (77.4 kg)   BMI 26.72 kg/m    Physical Exam Vitals and nursing note reviewed.  Constitutional:      Appearance: She is well-developed.  HENT:     Head: Normocephalic and atraumatic.  Eyes:     Conjunctiva/sclera: Conjunctivae normal.  Cardiovascular:     Rate and Rhythm: Normal rate and regular rhythm.     Heart sounds: Normal heart sounds.  Pulmonary:     Effort: Pulmonary effort is normal.     Breath sounds: Normal breath sounds.  Abdominal:      General: Bowel sounds are normal.     Palpations: Abdomen is soft.  Musculoskeletal:     Cervical back: Normal range of motion.  Lymphadenopathy:     Cervical: No cervical adenopathy.  Skin:    General: Skin is warm and dry.     Capillary Refill: Capillary refill takes less than 2 seconds.  Neurological:     Mental Status: She is alert and oriented to person, place, and time.  Psychiatric:        Behavior: Behavior normal.      Musculoskeletal Exam: C-spine thoracic and lumbar spine were in good range of motion.  She has mild tenderness over SI joints.  Shoulder joints, elbow joints, wrist joints, MCPs PIPs and DIPs been good range of motion.  She had tenderness on palpation of the right lateral and medial epicondyle.  Hip joints, knee joints, ankles with good range of motion.  She had tenderness on palpation of bilateral trochanteric bursa more prominent on the left side.  There was no evidence of Achilles tendinitis or plantar fasciitis.  She had tenderness and synovitis of her right sternoclavicular joint.  CDAI Exam: CDAI Score: -- Patient Global: --; Provider Global: -- Swollen: --; Tender: -- Joint Exam 02/06/2020   No joint exam has been documented for this visit   There is currently no information documented on the homunculus. Go to the Rheumatology activity and complete the homunculus joint exam.  Investigation: No additional findings.  Imaging: XR Pelvis 1-2 Views  Result Date: 01/16/2020 No significant SI joint narrowing was noted.  Left SI joint sclerosis and irregularity was noted.  No hip joint narrowing was noted. Impression: Left SI joint to sclerosis was noted.   Recent Labs: Lab Results  Component Value Date   WBC 6.5 01/01/2020   HGB 13.0 01/01/2020   PLT 208 01/01/2020   NA 136 01/01/2020   K 4.0 01/01/2020   CL 103 01/01/2020   CO2 25 01/01/2020   GLUCOSE 91 01/01/2020   BUN 12 01/01/2020   CREATININE 0.91 01/01/2020   BILITOT 0.1 (L) 01/01/2020    ALKPHOS 77 01/01/2020   AST 23 01/01/2020   ALT 19 01/01/2020   PROT 6.4 01/16/2020   ALBUMIN 3.9 01/01/2020   CALCIUM 8.7 (L) 01/01/2020   GFRAA >60 01/01/2020   QFTBGOLDPLUS NEGATIVE 01/16/2020   January 16, 2020 SPEP normal, TB Gold negative, immunoglobulins normal, hepatitis B-, hepatitis C negative, HIV negative, CK 44, anti-CCP negative, _0 eta negative, Ro negative, Lyme negative, HLA-B27 negative  January 29, 2020 MRI pelvis without contrast read by Dr. Luane School #1 no specific abnormality of the SI joints is identified to explain the patient's symptoms 2.  Scattered sigmoid colon diverticula. 3.  Minimal proximal hamstring tendinopathy bilaterally   Speciality Comments: No specialty comments available.  Procedures:  Large Joint Inj: L greater trochanter on 02/06/2020 11:19 AM Indications: pain Details: 27 G 1.5 in needle, lateral approach  Arthrogram: No  Medications: 40 mg triamcinolone acetonide 40 MG/ML; 1.5 mL lidocaine 1 % Aspirate: 0 mL Outcome: tolerated well, no immediate complications Procedure, treatment alternatives, risks and benefits explained, specific risks discussed. Consent was given by the patient. Immediately prior to procedure a time out was called to verify the correct patient, procedure, equipment, support staff and site/side marked as required. Patient was prepped and draped in the usual sterile fashion.     Allergies: Penicillins and Shingrix [zoster vac recomb adjuvanted]   Assessment / Plan:     Visit Diagnoses: Pain of right sternoclavicular joint - Positive synovitis, CT scan showed synovitis and effusion.  She has chronic inflammatory arthritis.  All serologies negative.  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  I had detailed discussion with the patient.  Different treatment options and their side effects were discussed.  I discussed the option of sulfasalazine but  she declined.  She states with the pandemic she would like to try anti-inflammatories.  She has history of gastritis.  Have given her prescription for Celebrex 200 mg p.o. twice daily with meals.  She also takes antacid.  If her symptoms of gastritis get worse then she will stop Celebrex and we will consider sulfasalazine.  Have given her a handout to review.  We will also check a G6PD with her next labs.  I will check CBC and CMP at the follow-up visit.  All labs were discussed at length.  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  HLA-B27 negative.  MRI was negative for sacroiliitis.  Lateral epicondylitis of both elbows-handout on exercises was given.  Trochanteric bursitis of both hips-handout on exercises was given.  Her left trochanteric bursa was injected as described above per patient's request.  DDD (degenerative disc disease), cervical - S/p discectomy and cervical fusion at [facility].  She has chronic pain.  Positive ANA (antinuclear antibody) - ENA negative, C3-C4 normal  Dry mouth - ANA, Ro and La negative.  Repeat Ro antibody was negative.  Migraine without aura and without status migrainosus, not intractable  History of gastroesophageal reflux (GERD)  History of diverticulitis  History of depression  Other eczema - Advised appointment with dermatologist to rule out psoriasis.  Other fatigue   Education about COVID-19 provided at last visit.  Orders: Orders Placed This Encounter  Procedures  . Large Joint Inj   Meds ordered this encounter  Medications  . celecoxib (CELEBREX) 200 MG capsule    Sig: Take 1 capsule (200 mg total) by mouth 2 (two) times daily with a meal.    Dispense:  60 capsule    Refill:  1      Follow-Up Instructions: Return in about 2 months (around 04/07/2020) for Inflammatory arthritis.   Bo Merino, MD  Note - This record has been created using Editor, commissioning.  Chart creation errors  have been sought, but may not always  have been located. Such creation errors do not reflect on  the standard of medical care.

## 2020-02-02 ENCOUNTER — Encounter: Payer: Self-pay | Admitting: Rheumatology

## 2020-02-06 ENCOUNTER — Ambulatory Visit: Payer: PRIVATE HEALTH INSURANCE | Admitting: Rheumatology

## 2020-02-06 ENCOUNTER — Other Ambulatory Visit: Payer: Self-pay

## 2020-02-06 ENCOUNTER — Encounter: Payer: Self-pay | Admitting: Rheumatology

## 2020-02-06 ENCOUNTER — Telehealth: Payer: Self-pay | Admitting: Cardiology

## 2020-02-06 VITALS — BP 134/89 | HR 83 | Resp 15 | Ht 67.0 in | Wt 170.6 lb

## 2020-02-06 DIAGNOSIS — Z8659 Personal history of other mental and behavioral disorders: Secondary | ICD-10-CM

## 2020-02-06 DIAGNOSIS — M25511 Pain in right shoulder: Secondary | ICD-10-CM | POA: Diagnosis not present

## 2020-02-06 DIAGNOSIS — M7061 Trochanteric bursitis, right hip: Secondary | ICD-10-CM | POA: Diagnosis not present

## 2020-02-06 DIAGNOSIS — L308 Other specified dermatitis: Secondary | ICD-10-CM

## 2020-02-06 DIAGNOSIS — G43009 Migraine without aura, not intractable, without status migrainosus: Secondary | ICD-10-CM

## 2020-02-06 DIAGNOSIS — G8929 Other chronic pain: Secondary | ICD-10-CM

## 2020-02-06 DIAGNOSIS — R682 Dry mouth, unspecified: Secondary | ICD-10-CM

## 2020-02-06 DIAGNOSIS — M533 Sacrococcygeal disorders, not elsewhere classified: Secondary | ICD-10-CM | POA: Diagnosis not present

## 2020-02-06 DIAGNOSIS — M7062 Trochanteric bursitis, left hip: Secondary | ICD-10-CM

## 2020-02-06 DIAGNOSIS — R7689 Other specified abnormal immunological findings in serum: Secondary | ICD-10-CM

## 2020-02-06 DIAGNOSIS — M7712 Lateral epicondylitis, left elbow: Secondary | ICD-10-CM

## 2020-02-06 DIAGNOSIS — Z8719 Personal history of other diseases of the digestive system: Secondary | ICD-10-CM

## 2020-02-06 DIAGNOSIS — R5383 Other fatigue: Secondary | ICD-10-CM

## 2020-02-06 DIAGNOSIS — R768 Other specified abnormal immunological findings in serum: Secondary | ICD-10-CM

## 2020-02-06 DIAGNOSIS — M5030 Other cervical disc degeneration, unspecified cervical region: Secondary | ICD-10-CM

## 2020-02-06 DIAGNOSIS — M7711 Lateral epicondylitis, right elbow: Secondary | ICD-10-CM

## 2020-02-06 MED ORDER — TRIAMCINOLONE ACETONIDE 40 MG/ML IJ SUSP
40.0000 mg | INTRAMUSCULAR | Status: AC | PRN
  Administered 2020-02-06: 40 mg via INTRA_ARTICULAR

## 2020-02-06 MED ORDER — LIDOCAINE HCL 1 % IJ SOLN
1.5000 mL | INTRAMUSCULAR | Status: AC | PRN
  Administered 2020-02-06: 1.5 mL

## 2020-02-06 MED ORDER — CELECOXIB 200 MG PO CAPS
200.0000 mg | ORAL_CAPSULE | Freq: Two times a day (BID) | ORAL | 1 refills | Status: DC
Start: 2020-02-06 — End: 2020-03-20

## 2020-02-06 NOTE — Patient Instructions (Addendum)
Elbow and Forearm Exercises Ask your health care provider which exercises are safe for you. Do exercises exactly as told by your health care provider and adjust them as directed. It is normal to feel mild stretching, pulling, tightness, or discomfort as you do these exercises. Stop right away if you feel sudden pain or your pain gets worse. Do not begin these exercises until told by your health care provider. Range-of-motion exercises These exercises warm up your muscles and joints and improve the movement and flexibility of your injured elbow and forearm. The exercises also help to relieve pain, numbness, and tingling. These exercises are done using the muscles in your injured elbow and forearm (active). Elbow flexion, active 1. Hold your left / right arm at your side, and bend your elbow (flexion) as far as you can using only your arm muscles. 2. Hold this position for __________ seconds. 3. Slowly return to the starting position. Repeat __________ times. Complete this exercise __________ times a day. Elbow extension, active 1. Hold your left / right arm at your side, and straighten your elbow (extension) as much as you can using only your arm muscles. 2. Hold this position for __________ seconds. 3. Slowly return to the starting position. Repeat __________ times. Complete this exercise __________ times a day. Active forearm rotation, supination This is an exercise in which you turn (rotate) your forearm palm up (supination). 1. Stand or sit with your elbows at your sides. 2. Bend your left / right elbow to a 90-degree angle (right angle). 3. Rotate your palm up until you feel a gentle stretch on the inside of your forearm. 4. Hold this position for __________ seconds. 5. Slowly return to the starting position. Repeat __________ times. Complete this exercise __________ times a day. Active forearm rotation, pronation This is an exercise in which you turn (rotate) your forearm palm down  (pronation). 1. Stand or sit with your elbows at your sides. 2. Bend your left / right elbow to a 90-degree angle (right angle). 3. Rotate your palm down until you feel a gentle stretch on the top of your forearm. 4. Hold this position for __________ seconds. 5. Slowly return to the starting position. Repeat __________ times. Complete this exercise __________ times a day. Stretching exercises These exercises warm up your muscles and joints and improve the movement and flexibility of your injured elbow and forearm. These exercises also help to relieve pain, numbness, and tingling. These exercises are done using your healthy elbow and forearm to help stretch the muscles in your injured elbow and forearm (active-assisted). Elbow flexion, active-assisted  1. Hold your left / right arm at your side, and bend your elbow (flexion) as much as you can using your left / right arm muscles. 2. Use your other hand to bend your left / right elbow farther. To do this, gently push up on your forearm until you feel a gentle stretch on the back of your elbow. 3. Hold this position for __________ seconds. 4. Slowly return to the starting position. Repeat __________ times. Complete this exercise __________ times a day. Elbow extension, active-assisted  1. Hold your left / right arm at your side, and straighten your elbow (extension) as much as you can using your left / right arm muscles. 2. Use your other hand to straighten the left / right elbow farther. To do this, gently push down on your forearm until you feel a gentle stretch on the inside of your elbow. 3. Hold this position for __________  seconds. 4. Slowly return to the starting position. Repeat __________ times. Complete this exercise __________ times a day. Active-assisted forearm rotation, supination This is an exercise in which you turn (rotate) your forearm palm up (supination). 1. Sit with your left / right elbow bent in a 90-degree angle (right  angle) with your forearm resting on a table. 2. Keeping your upper body and shoulder still, rotate your forearm so your palm faces upward. 3. Use your other hand to help rotate your forearm further until you feel a gentle to moderate stretch. 4. Hold this position for __________ seconds. 5. Slowly release the stretch and return to the starting position. Repeat __________ times. Complete this exercise __________ times a day. Active-assisted forearm rotation, pronation This is an exercise in which you turn (rotate) your forearm palm down (pronation). 1. Sit with your left / right elbow bent in a 90-degree angle (right angle) with your forearm resting on a table. 2. Keeping your upper body and shoulder still, rotate your forearm so your palm faces the tabletop. 3. Use your other hand to help rotate your forearm further until you feel a gentle to moderate stretch. 4. Hold this position for __________ seconds. 5. Slowly release the stretch and return to the starting position. Repeat __________ times. Complete this exercise __________ times a day. Passive elbow flexion, supine 1. Lie on your back (supine position). 2. Extend your left / right arm up in the air, bracing it with your other hand. 3. Let your left / right hand slowly lower toward your shoulder (passive flexion), while your elbow stays pointed toward the ceiling. You should feel a gentle stretch along the back of your upper arm and elbow. 4. If instructed by your health care provider, you may increase the intensity of your stretch by adding a small wrist weight or hand weight. 5. Hold this position for __________ seconds. 6. Slowly return to the starting position. Repeat __________ times. Complete this exercise __________ times a day. Passive elbow extension, supine  1. Lie on your back (supine position). Make sure that you are in a comfortable position that lets you relax your arm muscles. 2. Place a folded towel under your left /  right upper arm so your elbow and shoulder are at the same height. Straighten your left / right arm so your elbow does not rest on the bed or towel. 3. Let the weight of your hand stretch your elbow (passive extension). Keep your arm and chest muscles relaxed. You should feel a stretch on the inside of your elbow. 4. If told by your health care provider, you may increase the intensity of your stretch by adding a small wrist weight or hand weight. 5. Hold this position for __________ seconds. 6. Slowly release the stretch. Repeat __________ times. Complete this exercise __________ times a day. Strengthening exercises These exercises build strength and endurance in your elbow and forearm. Endurance is the ability to use your muscles for a long time, even after they get tired. Elbow flexion, isometric  1. Stand or sit up straight. 2. Bend your left / right elbow in a 90-degree angle (right angle), and keep your forearm at the height of your waist. Your thumb should be pointed toward the ceiling (neutral forearm). 3. Place your other hand on top of your left / right forearm. Gently push down while you resist with your left / right arm (isometric flexion). Push as hard as you can with both arms without causing any pain or movement   at your left / right elbow. 4. Hold this position for __________ seconds. 5. Slowly release the tension in both arms. Let your muscles relax completely before you repeat the exercise. Repeat __________ times. Complete this exercise __________ times a day. Elbow extension, isometric  1. Stand or sit up straight. 2. Place your left / right arm so your palm faces your abdomen and is at the height of your waist. 3. Place your other hand on the underside of your left / right forearm. Gently push up while you resist with your left / right arm (isometric extension). Push as hard as you can with both arms without causing any pain or movement at your left / right elbow. 4. Hold this  position for __________ seconds. 5. Slowly release the tension in both arms. Let your muscles relax completely before you repeat the exercise. Repeat __________ times. Complete this exercise __________ times a day. Elbow flexion with forearm palm up  1. Sit on a firm chair without armrests, or stand up. 2. Place your left / right arm at your side with your elbow straight and your palm facing forward. 3. Holding a __________weight or gripping a rubber exercise band or tubing, bend your elbow to bring your hand toward your shoulder (flexion). 4. Hold this position for __________ seconds. 5. Slowly return to the starting position. Repeat __________ times. Complete this exercise __________ times a day. Elbow extension, active  1. Sit on a firm chair without armrests, or stand up. 2. Hold a rubber exercise band or tubing in both hands. 3. Keeping your upper arms at your sides, bring both hands up to your left / right shoulder. Keep your left / right hand just below your other hand. 4. Straighten your left / right elbow (extension) while keeping your other arm still. 5. Hold this position for __________ seconds. 6. Control the resistance of the band or tubing as you return to the starting position. Repeat __________ times. Complete this exercise __________ times a day. Forearm rotation, supination  1. Sit with your left / right forearm supported on a table. Your elbow should be at waist height and bent at a 90-degree angle (right angle). 2. Gently grasp a lightweight hammer. 3. Rest your hand over the edge of the table with your palm facing down. 4. Without moving your left / right elbow, slowly rotate your forearm to turn your palm up toward the ceiling (supination). 5. Hold this position for __________ seconds. 6. Slowly return to the starting position. Repeat __________ times. Complete this exercise __________ times a day. Forearm rotation, pronation  1. Sit with your left / right forearm  supported on a table. Keep your elbow below shoulder height. 2. Gently grasp a lightweight hammer. 3. Rest your hand over the edge of the table with your palm facing up. 4. Without moving your left / right elbow, slowly rotate your forearm to turn your palm down toward the floor (pronation). 5. Hold this position for __________seconds. 6. Slowly return to the starting position. Repeat __________ times. Complete this exercise __________ times a day. This information is not intended to replace advice given to you by your health care provider. Make sure you discuss any questions you have with your health care provider. Document Revised: 09/15/2018 Document Reviewed: 06/15/2018 Elsevier Patient Education  [place] [facility] Ask your health care provider which exercises are safe for you. Do exercises exactly as told by your health care provider and adjust them as  directed. It is normal to feel mild stretching, pulling, tightness, or discomfort as you do these exercises. Stop right away if you feel sudden pain or your pain gets significantly worse. Do not begin these exercises until told by your health care provider. Stretching and range-of-motion exercises These exercises warm up your muscles and joints and improve the movement and flexibility of your hip and pelvis. Quadriceps stretch, prone  Lie on your abdomen on a firm surface, such as a bed or padded floor (prone position). Bend your left / right knee and reach back to hold your ankle or pant leg. If you cannot reach your ankle or pant leg, loop a belt around your foot and grab the belt instead. Gently pull your heel toward your buttocks. Your knee should not slide out to the side. You should feel a stretch in the front of your thigh and knee (quadriceps). Hold this position for __________ seconds. Repeat __________ times. Complete this exercise __________ times a day. Iliotibial band stretch An iliotibial band  is a strong band of muscle tissue that runs from the outer side of your hip to the outer side of your thigh and knee. Lie on your side with your left / right leg in the top position. Bend both of your knees and grab your left / right ankle. Stretch out your bottom arm to help you balance. Slowly bring your top knee back so your thigh goes behind your trunk. Slowly lower your top leg toward the floor until you feel a gentle stretch on the outside of your left / right hip and thigh. If you do not feel a stretch and your knee will not fall farther, place the heel of your other foot on top of your knee and pull your knee down toward the floor with your foot. Hold this position for __________ seconds. Repeat __________ times. Complete this exercise __________ times a day. Strengthening exercises These exercises build strength and endurance in your hip and pelvis. Endurance is the ability to use your muscles for a long time, even after they get tired. Straight leg raises, side-lying This exercise strengthens the muscles that rotate the leg at the hip and move it away from your body (hip abductors). Lie on your side with your left / right leg in the top position. Lie so your head, shoulder, hip, and knee line up. You may bend your bottom knee to help you balance. Roll your hips slightly forward so your hips are stacked directly over each other and your left / right knee is facing forward. Tense the muscles in your outer thigh and lift your top leg 4-6 inches (10-15 cm). Hold this position for __________ seconds. Slowly return to the starting position. Let your muscles relax completely before doing another repetition. Repeat __________ times. Complete this exercise __________ times a day. Leg raises, prone This exercise strengthens the muscles that move the hips (hip extensors). Lie on your abdomen on your bed or a firm surface. You can put a pillow under your hips if that is more comfortable for your  lower back. Bend your left / right knee so your foot is straight up in the air. Squeeze your buttocks muscles and lift your left / right thigh off the bed. Do not let your back arch. Tense your thigh muscle as hard as you can without increasing any knee pain. Hold this position for __________ seconds. Slowly lower your leg to the starting position and allow it to relax completely. Repeat __________  times. Complete this exercise __________ times a day. Hip hike Stand sideways on a bottom step. Stand on your left / right leg with your other foot unsupported next to the step. You can hold on to the railing or wall for balance if needed. Keep your knees straight and your torso square. Then lift your left / right hip up toward the ceiling. Slowly let your left / right hip lower toward the floor, past the starting position. Your foot should get closer to the floor. Do not lean or bend your knees. Repeat __________ times. Complete this exercise __________ times a day. This information is not intended to replace advice given to you by your health care provider. Make sure you discuss any questions you have with your health care provider. Document Revised: 09/15/2018 Document Reviewed: 03/16/2018 Elsevier Patient Education  [place].   Sulfasalazine delayed release tablets What is this medicine? SULFASALAZINE (sul fa SAL a zeen) is for ulcerative colitis and certain types of rheumatoid arthritis. This medicine may be used for other purposes; ask your health care provider or pharmacist if you have questions. COMMON BRAND NAME(S): Azulfidine En-Tabs, Sulfazine EC What should I tell my health care provider before I take this medicine? They need to know if you have any of these conditions:  asthma  blood disorders or anemia  glucose-6-phosphate dehydrogenase (G6PD) deficiency  intestinal obstruction  kidney disease liver disease    porphyria  urinary tract obstruction  an  unusual reaction to sulfasalazine, sulfa drugs, salicylates, other medicines, foods, dyes, or preservatives  pregnant or trying to get pregnant  breast-feeding How should I use this medicine? Take this medicine by mouth with a full glass of water. Follow the directions on the prescription label. If the medicine upsets your stomach, take it with food or milk. Do not crush or chew the tablets. Swallow the tablets whole. Take your medicine at regular intervals. Do not take your medicine more often than directed. Do not stop taking except on your doctor's advice. Talk to your pediatrician regarding the use of this medicine in children. Special care may be needed. While this drug may be prescribed for children as young as 6 years for selected conditions, precautions do apply. Patients over 23 years old may have a stronger reaction and need a smaller dose. Overdosage: If you think you have taken too much of this medicine contact a poison control center or emergency room at once. NOTE: This medicine is only for you. Do not share this medicine with others. What if I miss a dose? If you miss a dose, take it as soon as you can. If it is almost time for your next dose, take only that dose. Do not take double or extra doses. What may interact with this medicine?  digoxin  folic acid This list may not describe all possible interactions. Give your health care provider a list of all the medicines, herbs, non-prescription drugs, or dietary supplements you use. Also tell them if you smoke, drink alcohol, or use illegal drugs. Some items may interact with your medicine. What should I watch for while using this medicine? Visit your doctor or health care professional for regular checks on your progress. You will need frequent blood and urine checks. This medicine can make you more sensitive to the sun. Keep out of the sun. If you cannot avoid being in the sun, wear protective clothing and use sunscreen. Do not use  sun lamps or tanning beds/booths. Drink plenty of water  while taking this medicine. Tell your doctor if you see the tablet in your stools. Your body may not be absorbing the medicine. What side effects may I notice from receiving this medicine? Side effects that you should report to your doctor or health care professional as soon as possible:  allergic reactions like skin rash, itching or hives, swelling of the face, lips, or tongue  fever, chills, or any other sign of infection  painful, difficult, or reduced urination  redness, blistering, peeling or loosening of the skin, including inside the mouth  severe stomach pain  unusual bleeding or bruising  unusually weak or tired  yellowing of the skin or eyes Side effects that usually do not require medical attention (report to your doctor or health care professional if they continue or are bothersome):  headache  loss of appetite  nausea, vomiting  orange color to the urine  reduced sperm count This list may not describe all possible side effects. Call your doctor for medical advice about side effects. You may report side effects to FDA at 1-800-FDA-1088. Where should I keep my medicine? Keep out of the reach of children. Store at room temperature between 15 and 30 degrees C (59 and 86 degrees F). Keep container tightly closed. Throw away any unused medicine after the expiration date. NOTE: This sheet is a summary. It may not cover all possible information. If you have questions about this medicine, talk to your doctor, pharmacist, or health care provider.  2020 Elsevier/Gold Standard (2008-01-27 13:02:26)

## 2020-02-06 NOTE — Telephone Encounter (Signed)
Can you reply to this patients concern regarding her upcoming stress test at [facility]?   Subject:Upcoming cardiac stress test   Im concerned about being in a small room with others due to Covid. Im vaccinated but its been 7 months so my immunity is waning.  I dont think its good to be in such close proximity for the 3-4hr procedure even with masks. 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 Laypeople may not be wearing good fitting masks, certainly not D73.  Is there no way to get a private room? Sorry to be so insistent, Im not comfortable with what you described. Any help appreciated.  If nothing can be done, I might put off the procedure until its safer with more Covid  vaccinations.  Thank you, Pamela Sanders

## 2020-02-06 NOTE — Telephone Encounter (Signed)
Patient is scheduled for a myocardial perfusion on 02/09/20. However, she expressed concern with being closed-in with multiple people in a small room due to [place]. She is requesting to discuss the lay out of the room where test will take place. She would also like to discuss the process and what to expect (refer to patient message from 02/06/20). Please call.

## 2020-02-07 ENCOUNTER — Telehealth (HOSPITAL_COMMUNITY): Payer: Self-pay

## 2020-02-07 NOTE — Telephone Encounter (Signed)
Encounter complete. 

## 2020-02-09 ENCOUNTER — Other Ambulatory Visit: Payer: Self-pay

## 2020-02-09 ENCOUNTER — Ambulatory Visit (HOSPITAL_COMMUNITY)
Admission: RE | Admit: 2020-02-09 | Discharge: 2020-02-09 | Disposition: A | Discharge status: Home / Self Care | Payer: PRIVATE HEALTH INSURANCE | Source: Ambulatory Visit | Attending: Cardiology | Admitting: Cardiology

## 2020-02-09 DIAGNOSIS — R072 Precordial pain: Secondary | ICD-10-CM

## 2020-02-09 DIAGNOSIS — R0789 Other chest pain: Secondary | ICD-10-CM | POA: Insufficient documentation

## 2020-02-09 LAB — MYOCARDIAL PERFUSION IMAGING
LV dias vol: 83 mL (ref 46–106)
LV sys vol: 31 mL
Peak HR: 106 {beats}/min
Rest HR: 81 {beats}/min
SDS: 2
SRS: 1
SSS: 3
TID: 0.96

## 2020-02-09 MED ORDER — REGADENOSON 0.4 MG/5ML IV SOLN
0.4000 mg | Freq: Once | INTRAVENOUS | Status: AC
  Administered 2020-02-09: 0.4 mg via INTRAVENOUS

## 2020-02-09 MED ORDER — TECHNETIUM TC 99M TETROFOSMIN IV KIT
31.2000 | PACK | Freq: Once | INTRAVENOUS | Status: AC | PRN
  Administered 2020-02-09: 31.2 via INTRAVENOUS
  Filled 2020-02-09: qty 32

## 2020-02-09 MED ORDER — TECHNETIUM TC 99M TETROFOSMIN IV KIT
10.6000 | PACK | Freq: Once | INTRAVENOUS | Status: AC | PRN
  Administered 2020-02-09: 10.6 via INTRAVENOUS
  Filled 2020-02-09: qty 11

## 2020-02-13 ENCOUNTER — Encounter: Payer: Self-pay | Admitting: Family

## 2020-02-13 MED ORDER — ZOLPIDEM TARTRATE 5 MG PO TABS: 5.0000 mg | ORAL_TABLET | Freq: Every evening | ORAL | 2 refills | Status: DC | PRN

## 2020-02-24 ENCOUNTER — Encounter: Payer: Self-pay | Admitting: Family

## 2020-02-26 ENCOUNTER — Other Ambulatory Visit: Payer: Self-pay | Admitting: Family

## 2020-02-26 ENCOUNTER — Encounter: Payer: Self-pay | Admitting: Family

## 2020-02-26 ENCOUNTER — Ambulatory Visit (INDEPENDENT_AMBULATORY_CARE_PROVIDER_SITE_OTHER)
Admission: RE | Admit: 2020-02-26 | Discharge: 2020-02-26 | Disposition: A | Discharge status: Home / Self Care | Payer: Self-pay | Source: Ambulatory Visit | Attending: Cardiology | Admitting: Cardiology

## 2020-02-26 ENCOUNTER — Other Ambulatory Visit: Payer: Self-pay

## 2020-02-26 DIAGNOSIS — R0789 Other chest pain: Secondary | ICD-10-CM

## 2020-03-05 ENCOUNTER — Encounter: Payer: Self-pay | Admitting: Family

## 2020-03-07 ENCOUNTER — Encounter: Payer: Self-pay | Admitting: Rheumatology

## 2020-03-08 ENCOUNTER — Encounter: Payer: Self-pay | Admitting: Rheumatology

## 2020-03-08 DIAGNOSIS — R251 Tremor, unspecified: Secondary | ICD-10-CM | POA: Insufficient documentation

## 2020-03-08 HISTORY — DX: Tremor, unspecified: R25.1

## 2020-03-08 NOTE — Telephone Encounter (Signed)
Celebrex can also cause elevation of blood pressure and headaches.  I would recommend stopping Celebrex.  If the headaches persist she should see her PCP.

## 2020-03-11 ENCOUNTER — Encounter: Payer: Self-pay | Admitting: Family

## 2020-03-11 NOTE — Telephone Encounter (Signed)
If she is having elevated blood pressure then she cannot take Celebrex.  If she is not having any side effects from Celebrex and the headaches are from some other reason then she can resume Celebrex.  Sulfasalazine is a still advised.  If she is interested in starting sulfasalazine she will have to schedule an appointment.

## 2020-03-12 NOTE — Progress Notes (Signed)
Office Visit Note  Patient: Pamela Sanders             Date of Birth: [birth year]           MRN: [id number]             PCP: [name], NP Referring: [name], NP Visit Date: 03/20/2020 Occupation: @GUAROCC @  Subjective:  Medication Management (Patient complains of headaches as a side effect of Celebrex, Celebrex worked in terms of pain and since being off pain has returned)   History of Present Illness: Pamela Sanders is a 54 y.o. female with history of chronic inflammatory arthritis. She was placed on Celebrex at the last visit but she could not tolerate it due to headaches. She has been off Celebrex now and her pain has returned. She has been having a lot of pain and discomfort in her cervical spine.Right sternoclavicular joint pain also improved on her Celebrex but some of the discomfort is coming back. Currently she is not having any problems with epicondylitis or trochanteric bursitis. She is off-and-on discomfort over the left trochanteric bursa.  Activities of Daily Living:  Patient reports morning stiffness for 30-60 minutes.   Patient Reports nocturnal pain.  Difficulty dressing/grooming: Denies Difficulty climbing stairs: Denies Difficulty getting out of chair: Denies Difficulty using hands for taps, buttons, cutlery, and/or writing: Denies  Review of Systems  Constitutional: Positive for fatigue.  HENT: Positive for mouth dryness. Negative for mouth sores and nose dryness.   Eyes: Negative for pain, itching, visual disturbance and dryness.  Respiratory: Negative for cough, hemoptysis, shortness of breath and difficulty breathing.   Cardiovascular: Negative for chest pain, palpitations and swelling in legs/feet.  Gastrointestinal: Positive for constipation. Negative for abdominal pain, blood in stool and diarrhea.  Endocrine: Negative for increased urination.  Genitourinary: Negative for painful urination.  Musculoskeletal: Positive for arthralgias,  joint pain, joint swelling, myalgias, morning stiffness, muscle tenderness and myalgias. Negative for muscle weakness.  Skin: Negative for color change, rash and redness.  Allergic/Immunologic: Negative for susceptible to infections.  Neurological: Positive for numbness and headaches. Negative for dizziness and weakness.  Hematological: Negative for swollen glands.  Psychiatric/Behavioral: Positive for sleep disturbance. Negative for confusion.    PMFS History:  Patient Active Problem List   Diagnosis Date Noted  . Chest tightness 01/29/2020  . Chest pain 12/31/2019  . Sternoclavicular joint pain, right 10/15/2019  . Pain of left thumb 09/15/2018  . Lateral epicondylitis of right elbow 08/09/2018  . Insomnia 10/16/2015  . Hip pain 02/07/2015  . TMJ (dislocation of temporomandibular joint) 02/07/2015  . 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 Sinusitis 11/17/2013  . Routine general medical examination at a health care facility 08/09/2013  . General medical examination 12/03/2010  . 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 Depression 03/07/2008  . Migraine without aura 03/07/2008  . Allergic rhinitis 03/07/2008  . GERD 03/07/2008  . Osteoarthritis 03/07/2008  . NECK PAIN, CHRONIC 03/07/2008  . DIVERTICULITIS, HX OF 03/07/2008    Past Medical History:  Diagnosis Date  . Allergic rhinitis   . Depression   . Depression (emotion)   . DJD (degenerative joint disease)    chronic neck pain  . GERD (gastroesophageal reflux disease)   . Hip pain   . History of diverticulitis of colon   . Hx of diverticulitis of colon   . Insomnia   . Lateral epicondylitis of right elbow   . Leukocytopenia   . Migraine   . Neck pain, chronic   . Osteoarthritis   .  Pain of left thumb   . Pain of right sternoclavicular joint   . Phantosmia   . Sinusitis   . TMJ (dislocation of temporomandibular joint)     Family History  Problem Relation Age of Onset  . Arthritis Other   . Breast cancer Other   . Coronary artery  disease Maternal Grandmother   . Breast cancer Maternal Grandmother        carcinoid tumor in abd  . Hypertension Other   . Diverticulitis Mother   . Arthritis Mother   . Pancreatic cancer Other    Past Surgical History:  Procedure Laterality Date  . APPENDECTOMY    . CERVICAL DISCECTOMY     [facility]  . HEMORROIDECTOMY    . OVARIAN CYST SURGERY     abdominal surgery for ovarian cyst  . TISSUE GRAFT  02/07/15   pt reports gum graft for receeding gums--Dr Geralynn Ochs  . 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 She lives with her wife in a 2 story home. She drinks 1-2 cups of coffee a day and 1-2 glasses of tea. She exercises regularly.   Immunization History  Administered Date(s) Administered  . Influenza Split 03/24/2017  . Influenza Whole 03/11/2012  . Influenza,inj,Quad PF,6+ Mos 03/17/2018  . Influenza,trivalent, recombinat, inj, PF 03/28/2013  . Influenza-Unspecified 03/29/2014, 03/25/2015, 03/13/2016  . PFIZER SARS-COV-2 Vaccination 06/16/2019, 07/07/2019, 03/07/2020  . Td 04/05/2007  . Tdap 05/21/2014  . Zoster Recombinat (Shingrix) 01/17/2018, 03/21/2018     Objective: Vital Signs: BP 130/78 (BP Location: Left Arm, Patient Position: Sitting, Cuff Size: Small)   Pulse 90   Ht $R'5\' 7"'FT$  (1.702 m)   Wt 168 lb 9.6 oz (76.5 kg)   BMI 26.41 kg/m    Physical Exam Vitals and nursing note reviewed.  Constitutional:      Appearance: She is well-developed.  HENT:     Head: Normocephalic and atraumatic.  Eyes:     Conjunctiva/sclera: Conjunctivae normal.  Cardiovascular:     Rate and Rhythm: Normal rate and regular rhythm.     Heart sounds: Normal heart sounds.   Pulmonary:     Effort: Pulmonary effort is normal.     Breath sounds: Normal breath sounds.  Abdominal:     General: Bowel sounds are normal.     Palpations: Abdomen is soft.  Musculoskeletal:     Cervical back: Normal range of motion.  Lymphadenopathy:     Cervical: No cervical adenopathy.  Skin:    General: Skin is warm and dry.     Capillary Refill: Capillary refill takes less than 2 seconds.  Neurological:     Mental Status: She is alert and oriented to person, place, and time.  Psychiatric:        Behavior: Behavior normal.      Musculoskeletal Exam: She had discomfort range of motion of her cervical spine especially with left lateral rotation. Thoracic and lumbar spine were in good range of motion. She had no SI joint tenderness. Right SI joint inflammation has decreased. Shoulder joints, elbow joints,  wrist joints, MCPs and PIPs with good range of motion. She has PIP and DIP thickening in her hands consistent with osteoarthritis. Hip joints, knee joints, ankles, MTPs with good range of motion with no synovitis.  CDAI Exam: CDAI Score: 0.8  Patient Global: 4 mm; Provider Global: 4 mm Swollen: 1 ; Tender: 2  Joint Exam 03/20/2020      Right  Left  Sternoclavicular  Swollen Tender     Cervical Spine   Tender        Investigation: No additional findings.  Imaging: CT CARDIAC SCORING  Addendum Date: 02/26/2020   ADDENDUM REPORT: 02/26/2020 15:10 CLINICAL DATA:  Risk stratification EXAM: Coronary Calcium Score TECHNIQUE: The patient was scanned on a [employer]. Axial non-contrast 3 mm slices were carried out through the heart. The data set was analyzed on a dedicated work station and scored using the Agatson method. FINDINGS: Non-cardiac: See separate report from [facility]. Ascending Aorta: Normal caliber Pericardium: Normal Coronary arteries: Normal origins. IMPRESSION: Coronary calcium score of 0. Electronically Signed   By: Chrystie Nose M.D.    On: 02/26/2020 15:10   Result Date: 02/26/2020 EXAM: OVER-READ INTERPRETATION  CT CHEST The following report is an over-read performed by radiologist Dr. Trudie Reed of [facility] on 02/26/2020. This over-read does not include interpretation of cardiac or coronary anatomy or pathology. The coronary calcium score interpretation by the cardiologist is attached. COMPARISON:  None. FINDINGS: Within the visualized portions of the thorax there are no suspicious appearing pulmonary nodules or masses, there is no acute consolidative airspace disease, no pleural effusions, no pneumothorax and no lymphadenopathy. Visualized portions of the upper abdomen are unremarkable. There are no aggressive appearing lytic or blastic lesions noted in the visualized portions of the skeleton. IMPRESSION: 1. No significant incidental noncardiac findings are noted. Electronically Signed: By: Trudie Reed M.D. On: 02/26/2020 10:18    Recent Labs: Lab Results  Component Value Date   WBC 6.5 01/01/2020   HGB 13.0 01/01/2020   PLT 208 01/01/2020   NA 136 01/01/2020   K 4.0 01/01/2020   CL 103 01/01/2020   CO2 25 01/01/2020   GLUCOSE 91 01/01/2020   BUN 12 01/01/2020   CREATININE 0.91 01/01/2020   BILITOT 0.1 (L) 01/01/2020   ALKPHOS 77 01/01/2020   AST 23 01/01/2020   ALT 19 01/01/2020   PROT 6.4 01/16/2020   ALBUMIN 3.9 01/01/2020   CALCIUM 8.7 (L) 01/01/2020   GFRAA >60 01/01/2020   QFTBGOLDPLUS NEGATIVE 01/16/2020    Speciality Comments: No specialty comments available.  Procedures:  No procedures performed Allergies: Celebrex [celecoxib], Penicillins, and Shingrix [zoster vac recomb adjuvanted]   Assessment / Plan:     Visit Diagnoses: Pain of right sternoclavicular joint - Positive synovitis, CT scan showed synovitis and effusion.  She has chronic inflammatory arthritis.  All serologies negative. She had a very good response to Celebrex but she could not tolerate Celebrex due to  headaches and elevation in her blood pressure. We had detailed discussion regarding the Azulfidine EN-Tabs at the last visit. She wants to start on the medication. Indications side effects and her indications were discussed at length. Handout was given and consent was taken. I will obtain labs today as she has been taking long-term NSAIDs. We will also send the prescription for Azulfidine EN 500 mg p.o. twice daily. Total 30-day supply with two refills were given.  Medication counseling:  Baseline Immunosuppressant Labs TB GOLD Quantiferon TB Gold Latest Ref  Rng & Units 01/16/2020  Quantiferon TB Gold Plus NEGATIVE NEGATIVE   Hepatitis Panel Hepatitis Latest Ref Rng & Units 01/16/2020  Hep B Surface Ag NON-REACTI NON-REACTIVE  Hep B IgM NON-REACTI NON-REACTIVE  Hep C Ab NON-REACTI NON-REACTIVE  Hep C Ab NON-REACTI NON-REACTIVE   HIV Lab Results  Component Value Date   HIV NON-REACTIVE 01/16/2020   HIV NONREACTIVE 04/15/2015   Immunoglobulins Immunoglobulin Electrophoresis Latest Ref Rng & Units 01/16/2020  IgA  47 - 310 mg/dL 178  IgG 600 - 1,640 mg/dL 985  IgM 50 - 300 mg/dL 91   SPEP Serum Protein Electrophoresis Latest Ref Rng & Units 01/16/2020  Total Protein 6.1 - 8.1 g/dL 6.4  Albumin 3.8 - 4.8 g/dL 3.9  Alpha-1 0.2 - 0.3 g/dL 0.3  Alpha-2 0.5 - 0.9 g/dL 0.6  Beta Globulin 0.4 - 0.6 g/dL 0.4  Beta 2 0.2 - 0.5 g/dL 0.3  Gamma Globulin 0.8 - 1.7 g/dL 0.9   G6PD No results found for: pending TPMT No results found for: TPMT   Chest x-ray: January 01, 2020  Does the patient have an allergy to sulfa drugs? No  Patient was counseled on the purpose, proper use, and adverse effects of sulfasalazine including risk of infection and chance of nausea, headache, and sun sensitivity.  Also discussed risk of skin rash and advised patient to stop the medication and let us know if she develops a rash. Also discussed for the potential of discoloration of the urine, sweat, or tears.  Advised  patient to avoid live vaccines.  Recommend annual influenza, Pneumovax 23, Prevnar 13, and Shingrix as indicated.   Reviewed the importance of frequent labs to monitor liver, kidneys, and blood counts.  Standing orders placed and patient to return 1 month after starting therapy and then every 3 months.  Provided patient with educational materials on sulfasalazine and answered all questions.  Patient consented to sulfasalazine use, and consent will be uploaded into the media tab.    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  HLA-B27 negative.  MRI was negative for sacroiliitis. She is currently not having any pain.  Medication management - Plan: CBC with Differential/Platelet, COMPLETE METABOLIC PANEL WITH GFR, today and then every 3 months to monitor for drug toxicity. CBC with Differential/Platelet, COMPLETE METABOLIC PANEL WITH GFR  Lateral epicondylitis of both elbows-improved  Trochanteric bursitis of both hips-improved  DDD (degenerative disc disease), cervical - S/p discectomy and cervical fusion at [facility]. She is having severe neck pain I am uncertain if it is related to the chronic inflammatory arthritis or underlying disc disease. Per her request I have given her prednisone taper. Have advised her to contact her neurosurgeon in case she continues to have ongoing pain and discomfort in her cervical spine after prednisone taper.  Positive ANA (antinuclear antibody) - ENA negative, C3-C4 normal  Dry mouth - ANA, Ro and La negative.  Repeat Ro antibody was negative.  Migraine without aura and without status migrainosus, not intractable  History of diverticulitis  History of gastroesophageal reflux (GERD)  History of depression  Other fatigue  Other eczema - Advised appointment with dermatologist to rule out psoriasis.  Educated about COVID-19 virus infection-she is fully vaccinated against COVID-19. Use of mask, social distancing and  hand hygiene was discussed. Use of monoclonal antibodies were discussed in case she develops COVID-19 infection.  Orders: Orders Placed This Encounter  Procedures  . CBC with Differential/Platelet  . COMPLETE  METABOLIC PANEL WITH GFR  . CBC with Differential/Platelet  . COMPLETE METABOLIC PANEL WITH GFR  . Glucose 6 phosphate dehydrogenase   Meds ordered this encounter  Medications  . predniSONE (DELTASONE) 5 MG tablet    Sig: Take 4 tabs po x 4 days, 3  tabs po x 4 days, 2  tabs po x 4 days, 1  tab po x 4 days    Dispense:  40 tablet    Refill:  0  . sulfaSALAzine (AZULFIDINE EN-TABS) 500 MG EC tablet    Sig: Take 2 tablets (1,000 mg total) by mouth 2 (two) times daily.    Dispense:  120 tablet    Refill:  2    Follow-Up Instructions: Return in about 6 weeks (around 05/01/2020) for Chronic inflammatory arthritis.   Bo Merino, MD  Note - This record has been created using Editor, commissioning.  Chart creation errors have been sought, but may not always  have been located. Such creation errors do not reflect on  the standard of medical care.

## 2020-03-20 ENCOUNTER — Encounter: Payer: Self-pay | Admitting: Rheumatology

## 2020-03-20 ENCOUNTER — Telehealth: Payer: Self-pay

## 2020-03-20 ENCOUNTER — Ambulatory Visit (INDEPENDENT_AMBULATORY_CARE_PROVIDER_SITE_OTHER): Payer: PRIVATE HEALTH INSURANCE | Admitting: Rheumatology

## 2020-03-20 ENCOUNTER — Other Ambulatory Visit: Payer: Self-pay

## 2020-03-20 VITALS — BP 130/78 | HR 90 | Ht 67.0 in | Wt 168.6 lb

## 2020-03-20 DIAGNOSIS — M7711 Lateral epicondylitis, right elbow: Secondary | ICD-10-CM | POA: Diagnosis not present

## 2020-03-20 DIAGNOSIS — Z7189 Other specified counseling: Secondary | ICD-10-CM

## 2020-03-20 DIAGNOSIS — R768 Other specified abnormal immunological findings in serum: Secondary | ICD-10-CM

## 2020-03-20 DIAGNOSIS — R5383 Other fatigue: Secondary | ICD-10-CM

## 2020-03-20 DIAGNOSIS — G43009 Migraine without aura, not intractable, without status migrainosus: Secondary | ICD-10-CM

## 2020-03-20 DIAGNOSIS — M7061 Trochanteric bursitis, right hip: Secondary | ICD-10-CM

## 2020-03-20 DIAGNOSIS — Z79899 Other long term (current) drug therapy: Secondary | ICD-10-CM | POA: Diagnosis not present

## 2020-03-20 DIAGNOSIS — M533 Sacrococcygeal disorders, not elsewhere classified: Secondary | ICD-10-CM

## 2020-03-20 DIAGNOSIS — M25511 Pain in right shoulder: Secondary | ICD-10-CM | POA: Diagnosis not present

## 2020-03-20 DIAGNOSIS — M7062 Trochanteric bursitis, left hip: Secondary | ICD-10-CM

## 2020-03-20 DIAGNOSIS — Z8659 Personal history of other mental and behavioral disorders: Secondary | ICD-10-CM

## 2020-03-20 DIAGNOSIS — G8929 Other chronic pain: Secondary | ICD-10-CM

## 2020-03-20 DIAGNOSIS — M5030 Other cervical disc degeneration, unspecified cervical region: Secondary | ICD-10-CM

## 2020-03-20 DIAGNOSIS — M7712 Lateral epicondylitis, left elbow: Secondary | ICD-10-CM

## 2020-03-20 DIAGNOSIS — R682 Dry mouth, unspecified: Secondary | ICD-10-CM

## 2020-03-20 DIAGNOSIS — Z8719 Personal history of other diseases of the digestive system: Secondary | ICD-10-CM

## 2020-03-20 DIAGNOSIS — L308 Other specified dermatitis: Secondary | ICD-10-CM

## 2020-03-20 MED ORDER — PREDNISONE 5 MG PO TABS: ORAL_TABLET | ORAL | 0 refills | Status: DC

## 2020-03-20 MED ORDER — SULFASALAZINE 500 MG PO TBEC: 1000.0000 mg | DELAYED_RELEASE_TABLET | Freq: Two times a day (BID) | ORAL | 2 refills | Status: DC

## 2020-03-20 NOTE — Patient Instructions (Signed)
Sulfasalazine delayed release tablets What is this medicine? SULFASALAZINE (sul fa SAL a zeen) is for ulcerative colitis and certain types of rheumatoid arthritis. This medicine may be used for other purposes; ask your health care provider or pharmacist if you have questions. COMMON BRAND NAME(S): Azulfidine En-Tabs, Sulfazine EC What should I tell my health care provider before I take this medicine? They need to know if you have any of these conditions:  asthma  blood disorders or anemia  glucose-6-phosphate dehydrogenase (G6PD) deficiency  intestinal obstruction  kidney disease  liver disease  porphyria  urinary tract obstruction  an unusual reaction to sulfasalazine, sulfa drugs, salicylates, other medicines, foods, dyes, or preservatives  pregnant or trying to get pregnant  breast-feeding How should I use this medicine? Take this medicine by mouth with a full glass of water. Follow the directions on the prescription label. If the medicine upsets your stomach, take it with food or milk. Do not crush or chew the tablets. Swallow the tablets whole. Take your medicine at regular intervals. Do not take your medicine more often than directed. Do not stop taking except on your doctor's advice. Talk to your pediatrician regarding the use of this medicine in children. Special care may be needed. While this drug may be prescribed for children as young as 6 years for selected conditions, precautions do apply. Patients over 20 years old may have a stronger reaction and need a smaller dose. Overdosage: If you think you have taken too much of this medicine contact a poison control center or emergency room at once. NOTE: This medicine is only for you. Do not share this medicine with others. What if I miss a dose? If you miss a dose, take it as soon as you can. If it is almost time for your next dose, take only that dose. Do not take double or extra doses. What may interact with this  medicine?  digoxin  folic acid This list may not describe all possible interactions. Give your health care provider a list of all the medicines, herbs, non-prescription drugs, or dietary supplements you use. Also tell them if you smoke, drink alcohol, or use illegal drugs. Some items may interact with your medicine. What should I watch for while using this medicine? Visit your doctor or health care professional for regular checks on your progress. You will need frequent blood and urine checks. This medicine can make you more sensitive to the sun. Keep out of the sun. If you cannot avoid being in the sun, wear protective clothing and use sunscreen. Do not use sun lamps or tanning beds/booths. Drink plenty of water while taking this medicine. Tell your doctor if you see the tablet in your stools. Your body may not be absorbing the medicine. What side effects may I notice from receiving this medicine? Side effects that you should report to your doctor or health care professional as soon as possible:  allergic reactions like skin rash, itching or hives, swelling of the face, lips, or tongue  fever, chills, or any other sign of infection  painful, difficult, or reduced urination  redness, blistering, peeling or loosening of the skin, including inside the mouth  severe stomach pain  unusual bleeding or bruising  unusually weak or tired  yellowing of the skin or eyes Side effects that usually do not require medical attention (report to your doctor or health care professional if they continue or are bothersome):  headache  loss of appetite  nausea, vomiting  orange color  to the urine  reduced sperm count This list may not describe all possible side effects. Call your doctor for medical advice about side effects. You may report side effects to FDA at 1-800-FDA-1088. Where should I keep my medicine? Keep out of the reach of children. Store at room temperature between 15 and 30  degrees C (59 and 86 degrees F). Keep container tightly closed. Throw away any unused medicine after the expiration date. NOTE: This sheet is a summary. It may not cover all possible information. If you have questions about this medicine, talk to your doctor, pharmacist, or health care provider.  2020 Elsevier/Gold Standard (2008-01-27 13:02:26)  Standing Labs We placed an order today for your standing lab work.   Please have your standing labs drawn in 1 month and then every 3 months  If possible, please have your labs drawn 2 weeks prior to your appointment so that the provider can discuss your results at your appointment.  We have open lab daily Monday through Thursday from 8:30-12:30 PM and 1:30-4:30 PM and Friday from 8:30-12:30 PM and 1:30-4:00 PM at the office of Dr. Bo Merino, [facility].   Please be advised, patients with office appointments requiring lab work will take precedents over walk-in lab work.  If possible, please come for your lab work on Monday and Friday afternoons, as you may experience shorter wait times. The office is located at [address], [place], [place], [place] [address] No appointment is necessary.   Labs are drawn by [facility]. Please bring your co-pay at the time of your lab draw.  You may receive a bill from [facility] for your lab work.  If you wish to have your labs drawn at another location, please call the office 24 hours in advance to send orders.  If you have any questions regarding directions or hours of operation,  please call [phone].   As a reminder, please drink plenty of water prior to coming for your lab work. Thanks!  COVID-19 vaccine recommendations:   COVID-19 vaccine is recommended for everyone (unless you are allergic to a vaccine component), even if you are on a medication that suppresses your immune system.   Do not take Tylenol or any anti-inflammatory medications (NSAIDs) 24 hours prior to the COVID-19  vaccination.   There is no direct evidence about the efficacy of the COVID-19 vaccine in individuals who are on medications that suppress the immune system.   Even if you are fully vaccinated, and you are on any medications that suppress your immune system, please continue to wear a mask, maintain at least six feet social distance and practice hand hygiene.   If you develop a COVID-19 infection, please contact your PCP or our office to determine if you need antibody infusion.  The booster vaccine is now available for immunocompromised patients. It is advised that if you had Pfizer vaccine you should get [employer] booster.  If you had a Moderna vaccine then you should get a Moderna booster. Johnson and Wynetta Emery does not have a booster vaccine at this time.  Please see the following web sites for updated information.   [email or web address]-[email or web address]

## 2020-03-20 NOTE — Telephone Encounter (Signed)
I attempted to contact patient and left message on machine to advise patient we need to draw a G6PD prior to patient starting Sulfasalazine. Future order is in place. Advised patient she can come to our office and have that drawn or go to a [facility] or [facility] near her. Advised patient to call the office with location if she chooses to go elsewhere for lab draw.

## 2020-03-21 ENCOUNTER — Other Ambulatory Visit (INDEPENDENT_AMBULATORY_CARE_PROVIDER_SITE_OTHER): Payer: PRIVATE HEALTH INSURANCE

## 2020-03-21 ENCOUNTER — Telehealth: Payer: Self-pay | Admitting: Family

## 2020-03-21 ENCOUNTER — Other Ambulatory Visit: Payer: Self-pay | Admitting: *Deleted

## 2020-03-21 DIAGNOSIS — Z79899 Other long term (current) drug therapy: Secondary | ICD-10-CM

## 2020-03-21 DIAGNOSIS — Z5181 Encounter for therapeutic drug level monitoring: Secondary | ICD-10-CM

## 2020-03-21 LAB — CBC WITH DIFFERENTIAL/PLATELET
Absolute Monocytes: 586 cells/uL (ref 200–950)
Basophils Absolute: 31 cells/uL (ref 0–200)
Basophils Relative: 0.5 %
Eosinophils Absolute: 79 cells/uL (ref 15–500)
Eosinophils Relative: 1.3 %
HCT: 41.9 % (ref 35.0–45.0)
Hemoglobin: 13.8 g/dL (ref 11.7–15.5)
Lymphs Abs: 1781 cells/uL (ref 850–3900)
MCH: 28.8 pg (ref 27.0–33.0)
MCHC: 32.9 g/dL (ref 32.0–36.0)
MCV: 87.5 fL (ref 80.0–100.0)
MPV: 11.6 fL (ref 7.5–12.5)
Monocytes Relative: 9.6 %
Neutro Abs: 3623 cells/uL (ref 1500–7800)
Neutrophils Relative %: 59.4 %
Platelets: 247 10*3/uL (ref 140–400)
RBC: 4.79 10*6/uL (ref 3.80–5.10)
RDW: 12.4 % (ref 11.0–15.0)
Total Lymphocyte: 29.2 %
WBC: 6.1 10*3/uL (ref 3.8–10.8)

## 2020-03-21 LAB — COMPLETE METABOLIC PANEL WITH GFR
AG Ratio: 1.7 (calc) (ref 1.0–2.5)
ALT: 17 U/L (ref 6–29)
AST: 17 U/L (ref 10–35)
Albumin: 4.1 g/dL (ref 3.6–5.1)
Alkaline phosphatase (APISO): 65 U/L (ref 37–153)
BUN: 11 mg/dL (ref 7–25)
CO2: 28 mmol/L (ref 20–32)
Calcium: 9.4 mg/dL (ref 8.6–10.4)
Chloride: 105 mmol/L (ref 98–110)
Creat: 0.98 mg/dL (ref 0.50–1.05)
GFR, Est African American: 76 mL/min/{1.73_m2} (ref >=60)
GFR, Est Non African American: 65 mL/min/{1.73_m2} (ref >=60)
Globulin: 2.4 g/dL (calc) (ref 1.9–3.7)
Glucose, Bld: 97 mg/dL (ref 65–99)
Potassium: 4 mmol/L (ref 3.5–5.3)
Sodium: 139 mmol/L (ref 135–146)
Total Bilirubin: 0.3 mg/dL (ref 0.2–1.2)
Total Protein: 6.5 g/dL (ref 6.1–8.1)

## 2020-03-21 NOTE — Telephone Encounter (Signed)
Lab order request.

## 2020-03-21 NOTE — Addendum Note (Signed)
Addended by: [name] on: 03/21/2020 01:43 PM   Modules accepted: Orders

## 2020-03-21 NOTE — Progress Notes (Signed)
CBC and CMP normal

## 2020-03-21 NOTE — Telephone Encounter (Signed)
Caller Tishina Lown  Call Back # [phone]  Patient states she seen rheumatology on yesterday. Per patient they forgot to draw  Her (g6pd) . Patient would like [name] to order the lab so she does not have to drive [id number] back to Dr Estanislado Pandy office.

## 2020-03-22 LAB — GLUCOSE 6 PHOSPHATE DEHYDROGENASE: G-6PDH: 15.3 U/g Hgb (ref 7.0–20.5)

## 2020-03-24 ENCOUNTER — Encounter: Payer: Self-pay | Admitting: Family

## 2020-03-25 ENCOUNTER — Telehealth: Payer: Self-pay | Admitting: Family

## 2020-03-25 ENCOUNTER — Encounter: Payer: Self-pay | Admitting: Rheumatology

## 2020-03-25 DIAGNOSIS — Z5181 Encounter for therapeutic drug level monitoring: Secondary | ICD-10-CM

## 2020-03-25 NOTE — Telephone Encounter (Signed)
See mychart.  

## 2020-03-25 NOTE — Telephone Encounter (Signed)
-----   Message from Carole Binning, LPN sent at 02/58/5277  3:32 PM EDT ----- Dr. Estanislado Pandy would like to have a CBC with Differential and a CMP with GFR 1 month after starting therapy and then every 3 months to monitor for drug toxicity. Thanks!

## 2020-03-25 NOTE — Progress Notes (Signed)
G6PD is normal.  Patient may start sulfasalazine.

## 2020-03-30 ENCOUNTER — Encounter: Payer: Self-pay | Admitting: Family Medicine

## 2020-03-30 ENCOUNTER — Encounter: Payer: Self-pay | Admitting: Rheumatology

## 2020-03-30 DIAGNOSIS — M542 Cervicalgia: Secondary | ICD-10-CM

## 2020-04-01 ENCOUNTER — Other Ambulatory Visit: Payer: Self-pay

## 2020-04-01 ENCOUNTER — Ambulatory Visit
Admission: RE | Admit: 2020-04-01 | Discharge: 2020-04-01 | Disposition: A | Discharge status: Home / Self Care | Payer: PRIVATE HEALTH INSURANCE | Source: Ambulatory Visit | Attending: Family Medicine | Admitting: Family Medicine

## 2020-04-01 ENCOUNTER — Ambulatory Visit: Payer: PRIVATE HEALTH INSURANCE | Admitting: Family Medicine

## 2020-04-01 VITALS — BP 138/91 | Ht 67.0 in | Wt 168.0 lb

## 2020-04-01 DIAGNOSIS — M542 Cervicalgia: Secondary | ICD-10-CM

## 2020-04-01 MED ORDER — METHYLPREDNISOLONE ACETATE 80 MG/ML IJ SUSP
80.0000 mg | Freq: Once | INTRAMUSCULAR | Status: AC
  Administered 2020-04-01: 80 mg via INTRAMUSCULAR

## 2020-04-01 MED ORDER — KETOROLAC TROMETHAMINE 60 MG/2ML IM SOLN
60.0000 mg | Freq: Once | INTRAMUSCULAR | Status: AC
Start: 2020-04-01 — End: 2020-04-01
  Administered 2020-04-01: 60 mg via INTRAMUSCULAR

## 2020-04-01 NOTE — Patient Instructions (Signed)
We will go ahead with an MRI of your cervical spine (likely at [facility]) to further assess. Heat as you have been 15 minutes at a time. Cervical collar for support. Stop the prednisone. Diclofenac twice a day with food for pain and inflammation - don't take aleve or ibuprofen while on this. Ok to continue the zanaflex. Consider increasing the gabapentin to 2 tabs morning, 1 tab afternoon, 2 tabs evening for a few days then to 2 tabs three times a day. You were given shots of toradol and depomedrol today. Topical capsaicin up to 4 times a day may be helpful. Follow up after the MRI for a no charge visit to go over results (ok to do this with Dr. Raeford Razor also).

## 2020-04-02 ENCOUNTER — Encounter: Payer: Self-pay | Admitting: Family Medicine

## 2020-04-02 NOTE — Progress Notes (Signed)
PCP: Pamela Alar, NP  Subjective:   HPI: Patient is a 54 y.o. female here for neck, left arm pain.  Patient reports history of chronic neck pain - had ACDF C5-6 remotely. However on Thursday without acute injury she developed severe worsening of her pain to 9/10 level from its typical 2-3 level. Pain worse with side bending especially to left which causes tingling down left arm to 3rd and 4th digits. No bowel/bladder dysfunction. Took low dose prednisone for past 2 weeks from rheumatologist but has not helped. zanaflex helped her sleep through the night.  Past Medical History:  Diagnosis Date  . Allergic rhinitis   . Depression   . Depression (emotion)   . DJD (degenerative joint disease)    chronic neck pain  . GERD (gastroesophageal reflux disease)   . Hip pain   . History of diverticulitis of colon   . Hx of diverticulitis of colon   . Insomnia   . Lateral epicondylitis of right elbow   . Leukocytopenia   . Migraine   . Neck pain, chronic   . Osteoarthritis   . Pain of left thumb   . Pain of right sternoclavicular joint   . Phantosmia   . Sinusitis   . TMJ (dislocation of temporomandibular joint)     Current Outpatient Medications on File Prior to Visit  Medication Sig Dispense Refill  . ALPRAZolam (XANAX) 0.25 MG tablet Take 0.25 mg by mouth as needed.     Marland Kitchen augmented betamethasone dipropionate (DIPROLENE-AF) 0.05 % ointment Apply 1 application topically as needed.     Marland Kitchen b complex vitamins capsule Take 1 capsule by mouth daily.     . calcium citrate-vitamin D (CITRACAL+D) 315-200 MG-UNIT per tablet Take 2 tablets by mouth daily.     . diphenhydrAMINE (BENADRYL) 25 MG tablet Take 25 mg by mouth daily as needed for itching or allergies.     . famotidine (PEPCID AC) 10 MG chewable tablet Chew 10 mg by mouth 2 (two) times daily.    Marland Kitchen gabapentin (NEURONTIN) 300 MG capsule TAKE 2 CAPSULES 2 TIMES A DAY 360 capsule 1  . loratadine (CLARITIN) 10 MG tablet Take 10 mg  by mouth daily.      . Melatonin 5 MG CAPS Take 5 mg by mouth at bedtime as needed (sleep).     . Multiple Vitamins-Minerals (CENTRUM PO) Take 1 tablet by mouth daily.     . norethindrone (AYGESTIN) 5 MG tablet Take 10 mg by mouth as directed.     . predniSONE (DELTASONE) 5 MG tablet Take 4 tabs po x 4 days, 3  tabs po x 4 days, 2  tabs po x 4 days, 1  tab po x 4 days 40 tablet 0  . Psyllium (METAMUCIL PO) Take by mouth.    . sulfaSALAzine (AZULFIDINE EN-TABS) 500 MG EC tablet Take 2 tablets (1,000 mg total) by mouth 2 (two) times daily. 120 tablet 2  . WELLBUTRIN XL 150 MG 24 hr tablet TAKE TWO TABLETS BY MOUTH DAILY 180 tablet 1  . zolpidem (AMBIEN) 5 MG tablet Take 1 tablet (5 mg total) by mouth at bedtime as needed. for sleep 30 tablet 2   No current facility-administered medications on file prior to visit.    Past Surgical History:  Procedure Laterality Date  . APPENDECTOMY    . CERVICAL DISCECTOMY     Dr. Harl Bowie 06/2011[facility]  . HEMORROIDECTOMY    . OVARIAN CYST SURGERY     abdominal  surgery for ovarian cyst  . TISSUE GRAFT  02/07/15   pt reports gum graft for receeding gums--Dr Geralynn Ochs  . TMJ ARTHROSCOPY     TMJ surgery    Allergies  Allergen Reactions  . Celebrex [Celecoxib]     Headaches  . Penicillins Hives  . Shingrix [Zoster Vac Recomb Adjuvanted]     Fever, myalgia, local redness    Social History   Socioeconomic History  . Marital status: Married    Spouse name: [name]  . Number of children: 0  . Years of education: Not on file  . Highest education level: Doctorate  Occupational History  . Occupation: Probation officer: [employer]  Tobacco Use  . Smoking status: Never Smoker  . Smokeless tobacco: Never Used  Vaping Use  . Vaping Use: Never used  Substance and Sexual Activity  . Alcohol use: Yes    Alcohol/week: 0.0 standard drinks    Comment: 2 drinks weekly, wine  . Drug use: No  . Sexual activity: Not on file   Other Topics Concern  . 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 She lives with her wife in a 2 story home. She drinks 1-2 cups of coffee a day and 1-2 glasses of tea. She exercises regularly.   Social Determinants of Health   Financial Resource Strain:   . Difficulty of Paying Living Expenses: Not on file  Food Insecurity:   . Worried About Charity fundraiser in the Last Year: Not on file  . Ran Out of Food in the Last Year: Not on file  Transportation Needs:   . Lack of Transportation (Medical): Not on file  . Lack of Transportation (Non-Medical): Not on file  Physical Activity:   . Days of Exercise per Week: Not on file  . Minutes of Exercise per Session: Not on file  Stress:   . Feeling of Stress : Not on file  Social Connections:   . Frequency of Communication with Friends and Family: Not on file  . Frequency of Social Gatherings with Friends and Family: Not on file  . Attends Religious Services: Not on file  . Active Member of Clubs or Organizations: Not on file  . Attends Archivist Meetings: Not on file  . Marital Status: Not on file  Intimate Partner Violence:   . Fear of Current or Ex-Partner: Not on file  . Emotionally Abused: Not on file  . Physically Abused: Not on file  . Sexually Abused: Not on file    Family History  Problem Relation Age of Onset  . Arthritis Other   . Breast cancer Other   . Coronary artery disease Maternal Grandmother   . Breast cancer Maternal Grandmother        carcinoid tumor in abd  . Hypertension Other   . Diverticulitis Mother   . Arthritis Mother   . Pancreatic cancer Other     BP (!) 138/91   Ht 5\' 7"  (1.702 m)   Wt 168 lb (76.2 kg)   BMI 26.31 kg/m    [facility]  Adult Exercise 04/01/2020  Frequency of aerobic exercise (# of days/week) 2  Average time in minutes 30  Frequency of strengthening activities (# of days/week) 1    No flowsheet data found.  Review of Systems: See HPI above.     Objective:  Physical Exam:  Gen: NAD but uncomfortable in exam room  Neck: No gross deformity, swelling, bruising. TTP .  No midline/bony TTP. ROM limited to 10 degrees flexion, 0 extension, 20 bilateral lateral rotations, 10 degree side bends with worsening of symptoms bending to left. BUE strength 5/5.   Sensation intact to light touch.   Trace left triceps reflex.  2+ right triceps, bilateral reflxes in biceps, brachioradialis tendons. Positive spurlings on left.   Assessment & Plan:  1. Cervical radiculopathy - significant worsening and noted decreased triceps reflex left side.  Distribution concerning for C7 or C8 radiculopathy specifically.  Not improving with prednisone dose pack.  Will go ahead with MRI to further assess.  IM toradol and depo given today.  Declined stronger pain medication for now.  Cervical collar, heat, diclofenac, zanaflex.  F/u after MRI - ordered and will review plain radiographs first.

## 2020-04-03 ENCOUNTER — Ambulatory Visit: Payer: PRIVATE HEALTH INSURANCE | Admitting: Family

## 2020-04-04 ENCOUNTER — Encounter: Payer: Self-pay | Admitting: Rheumatology

## 2020-04-05 ENCOUNTER — Encounter: Payer: Self-pay | Admitting: Family

## 2020-04-05 ENCOUNTER — Other Ambulatory Visit: Payer: Self-pay | Admitting: Family

## 2020-04-05 MED ORDER — MONTELUKAST SODIUM 10 MG PO TABS: 10.0000 mg | ORAL_TABLET | Freq: Every day | ORAL | 3 refills | Status: DC

## 2020-04-05 MED FILL — MONTELUKAST SOD 10 MG TAB: 10 | 30 days supply | Qty: 30 | Fill #0

## 2020-04-05 NOTE — Telephone Encounter (Signed)
Please notify patient that this is not a side effect of sulfasalazine.  Although she should discontinue sulfasalazine if she is sick.  And she should see her PCP for evaluation of upper respiratory tract infection.

## 2020-04-08 ENCOUNTER — Ambulatory Visit: Payer: PRIVATE HEALTH INSURANCE | Admitting: Rheumatology

## 2020-04-14 ENCOUNTER — Ambulatory Visit
Admission: RE | Admit: 2020-04-14 | Discharge: 2020-04-14 | Disposition: A | Discharge status: Home / Self Care | Payer: PRIVATE HEALTH INSURANCE | Source: Ambulatory Visit | Attending: Family Medicine | Admitting: Family Medicine

## 2020-04-14 DIAGNOSIS — M542 Cervicalgia: Secondary | ICD-10-CM

## 2020-04-22 ENCOUNTER — Encounter: Payer: Self-pay | Admitting: Family

## 2020-04-23 MED ORDER — DICLOFENAC SODIUM 75 MG PO TBEC: 75.0000 mg | DELAYED_RELEASE_TABLET | Freq: Two times a day (BID) | ORAL | 1 refills | Status: DC | PRN

## 2020-04-24 NOTE — Telephone Encounter (Signed)
Please advise patient to discontinue sulfasalazine and see if her symptoms resolve.  If her symptoms persist she should schedule an appointment with her PCP for evaluation of her nausea.  There are no drug interactions between meclizine and sulfasalazine.

## 2020-04-25 NOTE — Progress Notes (Signed)
Office Visit Note  Patient: Pamela Sanders             Date of Birth: 07-25-1965           MRN: [id number]             PCP: [name], NP Referring: [name], NP Visit Date: 05/08/2020 Occupation: _0 @  Subjective:  Neck pain  History of Present Illness: Pamela Sanders is a 54 y.o. female with history of chronic inflammatory arthritis.  Patient reports that she tried taking sulfasalazine after her last office visit on 03/20/2020 but developed nausea on a daily basis.  After discontinuing sulfasalazine her nausea resolved within 3 days and she has not had any recurrence.  She did not notice any clinical improvement while taking sulfasalazine.  She states that her joint pain and inflammation improved significantly while taking Celebrex but she had to discontinue Celebrex due to daily headaches.  Diclofenac was also effective in the past but she had to discontinue due to elevated LFTs.  She states that the discomfort and inflammation in her right sternoclavicular joint has improved significantly.  Bilateral lateral epicondylitis has resolved.  She continues to have chronic neck pain and stiffness.  She will be establishing care with pain management and will be starting physical therapy.  She reports that she has noticed some shaking in her hands recently.  She has a family history of Parkinson's but is unsure if she has family history of benign essential tremors.   She has not been evaluated by her PCP or neurologist for this new concern yet. She denies any increased lower back pain or SI joint pain recently.  She has occasional discomfort in her knee joints with strenuous activities but denies any joint swelling.  She denies any other new concerns.   Activities of Daily Living:  Patient reports morning stiffness for 30 minutes.   Patient Reports nocturnal pain.  Difficulty dressing/grooming: Denies Difficulty climbing stairs: Denies Difficulty getting out of chair:  Denies Difficulty using hands for taps, buttons, cutlery, and/or writing: Denies  Review of Systems  Constitutional: Negative for fatigue.  HENT: Positive for mouth dryness. Negative for mouth sores and nose dryness.   Eyes: Negative for pain, itching and dryness.  Respiratory: Negative for shortness of breath and difficulty breathing.   Cardiovascular: Negative for chest pain and palpitations.  Gastrointestinal: Negative for blood in stool, constipation and diarrhea.  Endocrine: Negative for increased urination.  Genitourinary: Negative for difficulty urinating.  Musculoskeletal: Positive for arthralgias, joint pain and morning stiffness. Negative for joint swelling, myalgias, muscle tenderness and myalgias.  Skin: Positive for rash. Negative for color change and redness.  Allergic/Immunologic: Negative for susceptible to infections.  Neurological: Positive for headaches. Negative for dizziness, numbness, memory loss and weakness.  Hematological: Negative for bruising/bleeding tendency.  Psychiatric/Behavioral: Positive for depressed mood. Negative for confusion and sleep disturbance. The patient is nervous/anxious.     PMFS History:  Patient Active Problem List   Diagnosis Date Noted  . Chest tightness 01/29/2020  . Chest pain 12/31/2019  . Sternoclavicular joint pain, right 10/15/2019  . Pain of left thumb 09/15/2018  . Lateral epicondylitis of right elbow 08/09/2018  . Insomnia 10/16/2015  . Hip pain 02/07/2015  . TMJ (dislocation of temporomandibular joint) 02/07/2015  . 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 Sinusitis 11/17/2013  . Routine general medical examination at a health care facility 08/09/2013  . General medical examination 12/03/2010  . 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 Depression 03/07/2008  . Migraine without  aura 03/07/2008  . Allergic rhinitis 03/07/2008  . GERD 03/07/2008  . Osteoarthritis 03/07/2008  . NECK PAIN, CHRONIC 03/07/2008  . DIVERTICULITIS, HX OF 03/07/2008     Past Medical History:  Diagnosis Date  . Allergic rhinitis   . Depression   . Depression (emotion)   . DJD (degenerative joint disease)    chronic neck pain  . GERD (gastroesophageal reflux disease)   . Hip pain   . History of diverticulitis of colon   . Hx of diverticulitis of colon   . Insomnia   . Lateral epicondylitis of right elbow   . Leukocytopenia   . Migraine   . Neck pain, chronic   . Osteoarthritis   . Pain of left thumb   . Pain of right sternoclavicular joint   . Phantosmia   . Sinusitis   . TMJ (dislocation of temporomandibular joint)     Family History  Problem Relation Age of Onset  . Arthritis Other   . Breast cancer Other   . Coronary artery disease Maternal Grandmother   . Breast cancer Maternal Grandmother        carcinoid tumor in abd  . Hypertension Other   . Diverticulitis Mother   . Arthritis Mother   . Pancreatic cancer Other    Past Surgical History:  Procedure Laterality Date  . APPENDECTOMY    . CERVICAL DISCECTOMY     Dr. Harl Bowie 06/2011[facility]  . HEMORROIDECTOMY    . OVARIAN CYST SURGERY     abdominal surgery for ovarian cyst  . TISSUE GRAFT  02/07/15   pt reports gum graft for receeding gums--Dr Geralynn Ochs  . 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 She lives with her wife in a 2 story home. She drinks 1-2 cups of coffee a day and 1-2 glasses of tea. She exercises regularly.   Immunization History  Administered Date(s) Administered  . Influenza Split 03/24/2017  . Influenza Whole 03/11/2012  . Influenza,inj,Quad PF,6+ Mos 03/17/2018  . Influenza,trivalent, recombinat, inj, PF 03/28/2013  . Influenza-Unspecified 03/29/2014, 03/25/2015,  03/13/2016  . PFIZER SARS-COV-2 Vaccination 06/16/2019, 07/07/2019, 03/07/2020  . Td 04/05/2007  . Tdap 05/21/2014  . Zoster Recombinat (Shingrix) 01/17/2018, 03/21/2018     Objective: Vital Signs: BP 130/88 (BP Location: Left Arm, Patient Position: Sitting, Cuff Size: Normal)   Pulse 85   Resp 14   Ht _0  (1.702 m)   Wt 159 lb 3.2 oz (72.2 kg)   BMI 24.93 kg/m    Physical Exam Vitals and nursing note reviewed.  Constitutional:      Appearance: She is well-developed.  HENT:     Head: Normocephalic and atraumatic.  Eyes:     Conjunctiva/sclera: Conjunctivae normal.  Pulmonary:     Effort: Pulmonary effort is normal.  Abdominal:     Palpations: Abdomen is soft.  Musculoskeletal:     Cervical back: Normal range of motion.  Skin:    General: Skin is warm and dry.     Capillary Refill: Capillary refill takes less than 2 seconds.  Neurological:  Mental Status: She is alert and oriented to person, place, and time.  Psychiatric:        Behavior: Behavior normal.      Musculoskeletal Exam: C-spine has painful and limited lateral rotation and extension. Thoracic and lumbar spine good ROM.  No midline spinal tenderness.  No SI joint tenderness. Thickening of the right sternoclavicular joint but no tenderness or inflammation noted.  Shoulder joints, elbow joints, wrist joints, MCPs, PIPs, and DIPs good ROM with no synovitis.  Complete fist formation bilaterally.  Mild PIP and DIP thickening consistent with osteoarthritis of both hands. Knee joints good ROM with no discomfort.  No warmth or effusion of knee joints. Ankle joints good ROM with no tenderness or inflammation.    CDAI Exam: CDAI Score: -- Patient Global: --; Provider Global: -- Swollen: --; Tender: -- Joint Exam 05/08/2020   No joint exam has been documented for this visit   There is currently no information documented on the homunculus. Go to the Rheumatology activity and complete the homunculus joint  exam.  Investigation: No additional findings.  Imaging: MR CERVICAL SPINE WO CONTRAST  Result Date: 04/14/2020 CLINICAL DATA:  Neck pain. EXAM: MRI CERVICAL SPINE WITHOUT CONTRAST TECHNIQUE: Multiplanar, multisequence MR imaging of the cervical spine was performed. No intravenous contrast was administered. COMPARISON:  MRI cervical spine 04/07/2019. Cervical spine radiographs 04/01/2020 FINDINGS: Alignment: Normal Vertebrae: Negative for fracture or mass. ACDF with anterior plate and screws at Z6-1. Cord: Normal signal and morphology. Posterior Fossa, vertebral arteries, paraspinal tissues: Negative Disc levels: C2-3: Negative C3-4: Mild disc degeneration and mild uncinate spurring. Mild facet hypertrophy. Mild foraminal narrowing bilaterally. No cord deformity. C4-5: Negative C5-6: ACDF.  Negative for spinal or foraminal stenosis C6-7: Mild disc degeneration with diffuse uncinate spurring causing mild spinal stenosis and mild foraminal stenosis bilaterally. No change from the prior study. C7-T1: Negative IMPRESSION: Mild foraminal narrowing bilaterally at C3-4 and C6-7 due to spurring ACDF C5-6 without stenosis. Electronically Signed   By: Franchot Gallo M.D.   On: 04/14/2020 18:44    Recent Labs: Lab Results  Component Value Date   WBC 5.7 05/01/2020   HGB 13.5 05/01/2020   PLT 214 05/01/2020   NA 139 05/01/2020   K 4.6 05/01/2020   CL 107 05/01/2020   CO2 23 05/01/2020   GLUCOSE 87 05/01/2020   BUN 10 05/01/2020   CREATININE 1.00 05/01/2020   BILITOT 0.4 05/01/2020   ALKPHOS 77 01/01/2020   AST 25 05/01/2020   ALT 33 (H) 05/01/2020   PROT 6.4 05/01/2020   ALBUMIN 3.9 01/01/2020   CALCIUM 9.2 05/01/2020   GFRAA 76 03/20/2020   QFTBGOLDPLUS NEGATIVE 01/16/2020    Speciality Comments: No specialty comments available.  Procedures:  No procedures performed Allergies: Celebrex [celecoxib], Penicillins, and Shingrix [zoster vac recomb adjuvanted]    Assessment / Plan:     Visit  Diagnoses: Chronic inflammatory arthritis: All serologies negative (RF-, ESR WNL, 14-3-3 eta-, and anti-CCP negative).  History of tendinitis and chronic inflammatory arthritis which is responsive to prednisone and NSAID use.  She has no synovitis or dactylitis on examination today.  She has thickening of the right sternoclavicular joint but no tenderness or inflammation was noted on exam.  Bilateral lateral epicondylitis has resolved.  No tenderness palpation over either SI joints.  Overall her symptoms have improved significantly since initially presenting.  She tried a trial of Celebrex which seemed to resolve her joint pain and inflammation but she had to discontinue Celebrex use  due to frequent headaches.  She then tried a trial of sulfasalazine but developed nausea which was constant on a daily basis.  After discontinuing sulfasalazine her nausea resolved within 3 days.  Different treatment options were discussed today.  She does not want to take any immunosuppressive agents at this time.  She was advised to monitor her symptoms closely.  She was given a handout of natural anti-inflammatories to try taking.  She can also use Voltaren gel topically as needed for pain relief.  She was discouraged against taking Tylenol, daily NSAIDs, or alcohol use due to slight elevation in ALT with recent lab work on 05/01/20.  She is planning on establishing care with pain management to discuss options for her chronic neck pain.  She was advised to notify us if the pain and inflammation in her right sternoclavicular joint returns or worsens and we can schedule an ultrasound-guided cortisone injection in the future.  She will follow-up in the office in 6 months.  Pain of right sternoclavicular joint - Positive synovitis, CT scan showed synovitis and effusion: Thickening of the right sternoclavicular joint was noted.  No tenderness or inflammation was apparent.  Her discomfort and inflammation resolved after trial of  Celebrex.  She could not tolerate taking Celebrex due to daily headaches.  She also tried a trial of sulfasalazine but discontinued due to daily nausea.  She was advised to notify us of her discomfort returns and we can schedule an ultrasound-guided cortisone injection in the future. She plans on trying natural antiinflammatories.   High risk medication use -Discontinued celebrex (headaches) and sulfasalazine (nausea).  Mild elevation in ALT (33)-Advised to try to avoid tylenol, daily NSAIDs, and alcohol use.  She does not want to take any immunosuppressive agents at this time.  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  HLA-B27 negative.  MRI was negative for sacroiliitis. She has no SI joint tenderness to palpation on exam today.   Lateral epicondylitis of both elbows - Improved.  She has no tenderness to palpation on exam.  Previous treatment: braces, exercises, celebrex, and cortisone injection.    Trochanteric bursitis of both hips - She has tenderness palpation over bilateral trochanteric bursa.  Her discomfort is exacerbated by side sleeping.  She experiences intermittent tenderness and tightness of the IT bands bilaterally.  She had a left trochanteric bursa cortisone injection performed on 02/06/20, which alleviated her discomfort. She was encouraged to perform stretching exercises on a daily basis.  DDD (degenerative disc disease), cervical - S/p discectomy and cervical fusion at [facility].  Chronic pain and stiffness.  She was recently referred to pain management and physical therapy.  She has limited range of motion with lateral rotation and extension of the C-spine.  Positive ANA (antinuclear antibody) - ENA negative, C3-C4 normal: She has no clinical features of systemic lupus.  Dry mouth - ANA, Ro and La negative.  Repeat Ro antibody was negative.  Tremor of both hands: New onset.  Family history of PD.  No known family history of essential  tremor.  No increased stress or excessive caffeine use. 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 Tremor seems worse with outstretched arms.  She was advised to call her neurologist today to be further evaluated.   Other medical conditions are listed as follows:  Migraine without aura and without status migrainosus, not intractable   History of diverticulitis  History of depression  Other eczema  Other fatigue  History of gastroesophageal reflux (GERD)  Orders: No orders of the defined types were placed in this encounter.  No orders of the defined types were placed in this encounter.    Follow-Up Instructions: Return in about 6 months (around 11/06/2020) for Chronic inflammatory arthritis .   Ofilia Neas, PA-C  Note - This record has been created using Dragon software.  Chart creation errors have been sought, but may not always  have been located. Such creation errors do not reflect on  the standard of medical care.

## 2020-05-01 ENCOUNTER — Other Ambulatory Visit: Payer: Self-pay

## 2020-05-01 ENCOUNTER — Encounter: Payer: Self-pay | Admitting: Rheumatology

## 2020-05-01 ENCOUNTER — Other Ambulatory Visit (INDEPENDENT_AMBULATORY_CARE_PROVIDER_SITE_OTHER): Payer: PRIVATE HEALTH INSURANCE

## 2020-05-01 DIAGNOSIS — Z5181 Encounter for therapeutic drug level monitoring: Secondary | ICD-10-CM | POA: Diagnosis not present

## 2020-05-02 LAB — CBC WITH DIFFERENTIAL/PLATELET
Absolute Monocytes: 855 cells/uL (ref 200–950)
Basophils Absolute: 29 cells/uL (ref 0–200)
Basophils Relative: 0.5 %
Eosinophils Absolute: 40 cells/uL (ref 15–500)
Eosinophils Relative: 0.7 %
HCT: 41.3 % (ref 35.0–45.0)
Hemoglobin: 13.5 g/dL (ref 11.7–15.5)
Lymphs Abs: 1585 cells/uL (ref 850–3900)
MCH: 28.8 pg (ref 27.0–33.0)
MCHC: 32.7 g/dL (ref 32.0–36.0)
MCV: 88.2 fL (ref 80.0–100.0)
MPV: 12.3 fL (ref 7.5–12.5)
Monocytes Relative: 15 %
Neutro Abs: 3192 cells/uL (ref 1500–7800)
Neutrophils Relative %: 56 %
Platelets: 214 10*3/uL (ref 140–400)
RBC: 4.68 10*6/uL (ref 3.80–5.10)
RDW: 14.3 % (ref 11.0–15.0)
Total Lymphocyte: 27.8 %
WBC: 5.7 10*3/uL (ref 3.8–10.8)

## 2020-05-02 LAB — COMPREHENSIVE METABOLIC PANEL
AG Ratio: 1.8 (calc) (ref 1.0–2.5)
ALT: 33 U/L — ABNORMAL HIGH (ref 6–29)
AST: 25 U/L (ref 10–35)
Albumin: 4.1 g/dL (ref 3.6–5.1)
Alkaline phosphatase (APISO): 52 U/L (ref 37–153)
BUN: 10 mg/dL (ref 7–25)
CO2: 23 mmol/L (ref 20–32)
Calcium: 9.2 mg/dL (ref 8.6–10.4)
Chloride: 107 mmol/L (ref 98–110)
Creat: 1 mg/dL (ref 0.50–1.05)
Globulin: 2.3 g/dL (calc) (ref 1.9–3.7)
Glucose, Bld: 87 mg/dL (ref 65–99)
Potassium: 4.6 mmol/L (ref 3.5–5.3)
Sodium: 139 mmol/L (ref 135–146)
Total Bilirubin: 0.4 mg/dL (ref 0.2–1.2)
Total Protein: 6.4 g/dL (ref 6.1–8.1)

## 2020-05-06 NOTE — Progress Notes (Signed)
LFTs are mildly elevated.  Most likely due to the use of Voltaren tablets.  Please advise patient to cut back on Voltaren use.

## 2020-05-08 ENCOUNTER — Encounter: Payer: Self-pay | Admitting: Family Medicine

## 2020-05-08 ENCOUNTER — Ambulatory Visit: Payer: PRIVATE HEALTH INSURANCE | Admitting: Physician Assistant

## 2020-05-08 ENCOUNTER — Encounter: Payer: Self-pay | Admitting: Rheumatology

## 2020-05-08 ENCOUNTER — Other Ambulatory Visit: Payer: Self-pay

## 2020-05-08 VITALS — BP 130/88 | HR 85 | Resp 14 | Ht 67.0 in | Wt 159.2 lb

## 2020-05-08 DIAGNOSIS — G43009 Migraine without aura, not intractable, without status migrainosus: Secondary | ICD-10-CM

## 2020-05-08 DIAGNOSIS — M25511 Pain in right shoulder: Secondary | ICD-10-CM | POA: Diagnosis not present

## 2020-05-08 DIAGNOSIS — L308 Other specified dermatitis: Secondary | ICD-10-CM

## 2020-05-08 DIAGNOSIS — R768 Other specified abnormal immunological findings in serum: Secondary | ICD-10-CM

## 2020-05-08 DIAGNOSIS — M1990 Unspecified osteoarthritis, unspecified site: Secondary | ICD-10-CM

## 2020-05-08 DIAGNOSIS — R5383 Other fatigue: Secondary | ICD-10-CM

## 2020-05-08 DIAGNOSIS — M5030 Other cervical disc degeneration, unspecified cervical region: Secondary | ICD-10-CM

## 2020-05-08 DIAGNOSIS — M7712 Lateral epicondylitis, left elbow: Secondary | ICD-10-CM

## 2020-05-08 DIAGNOSIS — M533 Sacrococcygeal disorders, not elsewhere classified: Secondary | ICD-10-CM | POA: Diagnosis not present

## 2020-05-08 DIAGNOSIS — M7711 Lateral epicondylitis, right elbow: Secondary | ICD-10-CM | POA: Diagnosis not present

## 2020-05-08 DIAGNOSIS — M7062 Trochanteric bursitis, left hip: Secondary | ICD-10-CM

## 2020-05-08 DIAGNOSIS — G8929 Other chronic pain: Secondary | ICD-10-CM

## 2020-05-08 DIAGNOSIS — R682 Dry mouth, unspecified: Secondary | ICD-10-CM

## 2020-05-08 DIAGNOSIS — R251 Tremor, unspecified: Secondary | ICD-10-CM

## 2020-05-08 DIAGNOSIS — Z8659 Personal history of other mental and behavioral disorders: Secondary | ICD-10-CM

## 2020-05-08 DIAGNOSIS — Z8719 Personal history of other diseases of the digestive system: Secondary | ICD-10-CM

## 2020-05-08 DIAGNOSIS — Z79899 Other long term (current) drug therapy: Secondary | ICD-10-CM | POA: Diagnosis not present

## 2020-05-08 DIAGNOSIS — M7061 Trochanteric bursitis, right hip: Secondary | ICD-10-CM

## 2020-05-16 ENCOUNTER — Ambulatory Visit: Payer: PRIVATE HEALTH INSURANCE | Admitting: Neurology

## 2020-05-29 ENCOUNTER — Encounter: Payer: Self-pay | Admitting: Family

## 2020-05-29 ENCOUNTER — Other Ambulatory Visit (HOSPITAL_BASED_OUTPATIENT_CLINIC_OR_DEPARTMENT_OTHER): Payer: Self-pay | Admitting: Emergency Medicine

## 2020-05-29 ENCOUNTER — Other Ambulatory Visit: Payer: Self-pay

## 2020-05-29 ENCOUNTER — Encounter (HOSPITAL_BASED_OUTPATIENT_CLINIC_OR_DEPARTMENT_OTHER): Payer: Self-pay

## 2020-05-29 ENCOUNTER — Emergency Department (HOSPITAL_BASED_OUTPATIENT_CLINIC_OR_DEPARTMENT_OTHER)
Admission: EM | Admit: 2020-05-29 | Discharge: 2020-05-29 | Disposition: A | Discharge status: Home / Self Care | Payer: PRIVATE HEALTH INSURANCE | Attending: Emergency Medicine | Admitting: Emergency Medicine

## 2020-05-29 DIAGNOSIS — R197 Diarrhea, unspecified: Secondary | ICD-10-CM | POA: Diagnosis not present

## 2020-05-29 DIAGNOSIS — R112 Nausea with vomiting, unspecified: Secondary | ICD-10-CM | POA: Insufficient documentation

## 2020-05-29 LAB — COMPREHENSIVE METABOLIC PANEL
ALT: 20 U/L (ref 0–44)
AST: 18 U/L (ref 15–41)
Albumin: 3.6 g/dL (ref 3.5–5.0)
Alkaline Phosphatase: 52 U/L (ref 38–126)
Anion gap: 9 (ref 5–15)
BUN: 18 mg/dL (ref 6–20)
CO2: 22 mmol/L (ref 22–32)
Calcium: 8.2 mg/dL — ABNORMAL LOW (ref 8.9–10.3)
Chloride: 104 mmol/L (ref 98–111)
Creatinine, Ser: 0.94 mg/dL (ref 0.44–1.00)
GFR, Estimated: >60 mL/min (ref >60)
Glucose, Bld: 89 mg/dL (ref 70–99)
Potassium: 3.3 mmol/L — ABNORMAL LOW (ref 3.5–5.1)
Sodium: 135 mmol/L (ref 135–145)
Total Bilirubin: 0.7 mg/dL (ref 0.3–1.2)
Total Protein: 6.3 g/dL — ABNORMAL LOW (ref 6.5–8.1)

## 2020-05-29 LAB — CBC
HCT: 41 % (ref 36.0–46.0)
Hemoglobin: 13.6 g/dL (ref 12.0–15.0)
MCH: 29.4 pg (ref 26.0–34.0)
MCHC: 33.2 g/dL (ref 30.0–36.0)
MCV: 88.7 fL (ref 80.0–100.0)
Platelets: 187 10*3/uL (ref 150–400)
RBC: 4.62 MIL/uL (ref 3.87–5.11)
RDW: 13.6 % (ref 11.5–15.5)
WBC: 7.5 10*3/uL (ref 4.0–10.5)
nRBC: 0 % (ref 0.0–0.2)

## 2020-05-29 LAB — URINALYSIS, ROUTINE W REFLEX MICROSCOPIC
Bilirubin Urine: NEGATIVE
Glucose, UA: NEGATIVE mg/dL
Hgb urine dipstick: NEGATIVE
Leukocytes,Ua: NEGATIVE
Nitrite: NEGATIVE
Protein, ur: NEGATIVE mg/dL
Specific Gravity, Urine: 1.02 (ref 1.005–1.030)
pH: 6.5 (ref 5.0–8.0)

## 2020-05-29 LAB — LIPASE, BLOOD: Lipase: 21 U/L (ref 11–51)

## 2020-05-29 LAB — PREGNANCY, URINE: Preg Test, Ur: NEGATIVE

## 2020-05-29 MED ORDER — ONDANSETRON 4 MG PO TBDP: 4.0000 mg | ORAL_TABLET | Freq: Three times a day (TID) | ORAL | 0 refills | Status: DC | PRN

## 2020-05-29 MED ORDER — SODIUM CHLORIDE 0.9 % IV BOLUS
1000.0000 mL | Freq: Once | INTRAVENOUS | Status: AC
  Administered 2020-05-29: 1000 mL via INTRAVENOUS

## 2020-05-29 MED ORDER — FAMOTIDINE 20 MG PO TABS
20.0000 mg | ORAL_TABLET | Freq: Once | ORAL | Status: AC
  Administered 2020-05-29: 20 mg via ORAL
  Filled 2020-05-29: qty 1

## 2020-05-29 MED ORDER — ONDANSETRON HCL 4 MG/2ML IJ SOLN
4.0000 mg | Freq: Once | INTRAMUSCULAR | Status: AC
  Administered 2020-05-29: 4 mg via INTRAVENOUS
  Filled 2020-05-29: qty 2

## 2020-05-29 MED ORDER — POTASSIUM CHLORIDE CRYS ER 20 MEQ PO TBCR
40.0000 meq | EXTENDED_RELEASE_TABLET | Freq: Once | ORAL | Status: AC
  Administered 2020-05-29: 40 meq via ORAL
  Filled 2020-05-29: qty 2

## 2020-05-29 MED FILL — ONDANSETRON ODT 4 MG TABLET: 4 | 3 days supply | Qty: 10 | Fill #0

## 2020-05-29 NOTE — ED Notes (Signed)
Patient given water for PO challenge.  

## 2020-05-29 NOTE — ED Triage Notes (Signed)
Pt states she has been vomiting since 5pm, at least 10-15 times. States unable to keep anything down, even ice chips. Feels fatigued, has some diarrhea. Denies sick contacts, vaccinated.

## 2020-05-29 NOTE — ED Provider Notes (Signed)
[facility] EMERGENCY DEPARTMENT Provider Note   CSN: [id number] Arrival date & time: 05/29/20  3825     History Chief Complaint  Patient presents with  . Emesis    Pamela Sanders is a 54 y.o. female.  Presents chief complaint of vomiting diarrhea.  Symptoms started yesterday have been persistent multiple times throughout the day.  Nonbloody nonbilious per patient.  Having a difficult time keeping down any fluids and feeling very dehydrated she presents to ER.  Denies fevers or cough.  Denies headache or chest pain or abdominal pain.  No recent travel no recent antibiotic use.        Past Medical History:  Diagnosis Date  . Allergic rhinitis   . Depression   . Depression (emotion)   . DJD (degenerative joint disease)    chronic neck pain  . GERD (gastroesophageal reflux disease)   . Hip pain   . History of diverticulitis of colon   . Hx of diverticulitis of colon   . Insomnia   . Lateral epicondylitis of right elbow   . Leukocytopenia   . Migraine   . Neck pain, chronic   . Osteoarthritis   . Pain of left thumb   . Pain of right sternoclavicular joint   . Phantosmia   . Sinusitis   . TMJ (dislocation of temporomandibular joint)     Patient Active Problem List   Diagnosis Date Noted  . Chest tightness 01/29/2020  . Chest pain 12/31/2019  . Sternoclavicular joint pain, right 10/15/2019  . Pain of left thumb 09/15/2018  . Lateral epicondylitis of right elbow 08/09/2018  . Insomnia 10/16/2015  . Hip pain 02/07/2015  . TMJ (dislocation of temporomandibular joint) 02/07/2015  . 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 Sinusitis 11/17/2013  . Routine general medical examination at a health care facility 08/09/2013  . General medical examination 12/03/2010  . 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 Depression 03/07/2008  . Migraine without aura 03/07/2008  . Allergic rhinitis 03/07/2008  . GERD 03/07/2008  . Osteoarthritis 03/07/2008  . NECK PAIN, CHRONIC 03/07/2008  .  DIVERTICULITIS, HX OF 03/07/2008    Past Surgical History:  Procedure Laterality Date  . APPENDECTOMY    . CERVICAL DISCECTOMY     Dr. Harl Bowie 06/2011[facility]  . HEMORROIDECTOMY    . OVARIAN CYST SURGERY     abdominal surgery for ovarian cyst  . TISSUE GRAFT  02/07/15   pt reports gum graft for receeding gums--Dr Geralynn Ochs  . TMJ ARTHROSCOPY     TMJ surgery     OB History   No obstetric history on file.     Family History  Problem Relation Age of Onset  . Arthritis Other   . Breast cancer Other   . Coronary artery disease Maternal Grandmother   . Breast cancer Maternal Grandmother        carcinoid tumor in abd  . Hypertension Other   . Diverticulitis Mother   . Arthritis Mother   . Pancreatic cancer Other     Social History   Tobacco Use  . Smoking status: Never Smoker  . Smokeless tobacco: Never Used  Vaping Use  . Vaping Use: Never used  Substance Use Topics  . Alcohol use: Yes    Alcohol/week: 0.0 standard drinks    Comment: 2 drinks weekly, wine  . Drug use: No    Home Medications Prior to Admission medications   Medication Sig Start Date End Date Taking? Authorizing Provider  ALPRAZolam Duanne Moron) 0.25 MG tablet Take  0.25 mg by mouth as needed.     [provider]  augmented betamethasone dipropionate (DIPROLENE-AF) 0.05 % ointment Apply 1 application topically as needed.     [provider]  b complex vitamins capsule Take 1 capsule by mouth daily.     [provider]  calcium citrate-vitamin D (CITRACAL+D) 315-200 MG-UNIT per tablet Take 2 tablets by mouth daily.     [provider]  CAPSAICIN EX Apply topically 2 (two) times daily.    [provider]  diphenhydrAMINE (BENADRYL) 25 MG tablet Take 25 mg by mouth daily as needed for itching or allergies.     [provider]  famotidine (PEPCID AC) 10 MG chewable tablet Chew 10 mg by mouth 2 (two) times daily.    [provider]  gabapentin (NEURONTIN)  300 MG capsule TAKE 2 CAPSULES 2 TIMES A DAY 02/26/20   [name], NP  loratadine (CLARITIN) 10 MG tablet Take 10 mg by mouth daily.      [provider]  Melatonin 5 MG CAPS Take 5 mg by mouth at bedtime as needed (sleep).     [provider]  Multiple Vitamins-Minerals (CENTRUM PO) Take 1 tablet by mouth daily.     [provider]  norethindrone (AYGESTIN) 5 MG tablet Take 10 mg by mouth as directed.  01/19/20   [provider]  ondansetron (ZOFRAN ODT) 4 MG disintegrating tablet Take 1 tablet (4 mg total) by mouth every 8 (eight) hours as needed for nausea or vomiting. 05/29/20   Luna Fuse, MD  Psyllium (METAMUCIL PO) Take by mouth.    [provider]  WELLBUTRIN XL 150 MG 24 hr tablet TAKE TWO TABLETS BY MOUTH DAILY 02/26/20   [name], NP  zolpidem (AMBIEN) 5 MG tablet Take 1 tablet (5 mg total) by mouth at bedtime as needed. for sleep 02/13/20   [name], NP    Allergies    Celebrex [celecoxib], Penicillins, and Shingrix [zoster vac recomb adjuvanted]  Review of Systems   Review of Systems  Constitutional: Negative for fever.  HENT: Negative for ear pain.   Eyes: Negative for pain.  Respiratory: Negative for cough.   Cardiovascular: Negative for chest pain.  Gastrointestinal: Negative for abdominal pain.  Genitourinary: Negative for flank pain.  Musculoskeletal: Negative for back pain.  Skin: Negative for rash.  Neurological: Negative for headaches.    Physical Exam Updated Vital Signs BP 111/68   Pulse 82   Temp 98.1 F (36.7 C) (Oral)   Resp 18   Ht 5\' 7"  (1.702 m)   Wt 74.8 kg   SpO2 100%   BMI 25.84 kg/m   Physical Exam Constitutional:      General: She is not in acute distress.    Appearance: Normal appearance.  HENT:     Head: Normocephalic.     Nose: Nose normal.  Eyes:     Extraocular Movements: Extraocular movements intact.  Cardiovascular:     Rate and Rhythm: Normal rate.   Pulmonary:     Effort: Pulmonary effort is normal.  Abdominal:     Palpations: Abdomen is soft.     Tenderness: There is no abdominal tenderness.  Musculoskeletal:        General: Normal range of motion.     Cervical back: Normal range of motion.  Skin:    General: Skin is warm.  Neurological:     General: No focal deficit present.     Mental Status:  She is alert and oriented to person, place, and time. Mental status is at baseline.     ED Results / Procedures / Treatments   Labs (all labs ordered are listed, but only abnormal results are displayed) Labs Reviewed  COMPREHENSIVE METABOLIC PANEL - Abnormal; Notable for the following components:      Result Value   Potassium 3.3 (*)    Calcium 8.2 (*)    Total Protein 6.3 (*)    All other components within normal limits  URINALYSIS, ROUTINE W REFLEX MICROSCOPIC - Abnormal; Notable for the following components:   Color, Urine YELLOW (*)    Ketones, ur LARGE (*)    All other components within normal limits  LIPASE, BLOOD  CBC  PREGNANCY, URINE    EKG None  Radiology No results found.  Procedures Procedures (including critical care time)  Medications Ordered in ED Medications  famotidine (PEPCID) tablet 20 mg (has no administration in time range)  sodium chloride 0.9 % bolus 1,000 mL (0 mLs Intravenous Stopped 05/29/20 1206)  ondansetron (ZOFRAN) injection 4 mg (4 mg Intravenous Given 05/29/20 1044)  potassium chloride SA (KLOR-CON) CR tablet 40 mEq (40 mEq Oral Given 05/29/20 1213)    ED Course  I have reviewed the triage vital signs and the nursing notes.  Pertinent labs & imaging results that were available during my care of the patient were reviewed by me and considered in my medical decision making (see chart for details).    MDM Rules/Calculators/A&P                          Vital signs within normal limits.  Physical sounds benign with no tenderness elicited.  Labs show evidence of dehydration and  ketones in the urine.  Patient given liter bolus of fluids and Zofran and subsequently tolerating oral hydration.  Potassium was low and repleted here in the ER.  Will recommend outpatient follow-up with the doctors in 2 or 3 days.  Given Zofran to go home with.  Advised immediate return if she continues to be unable to keep down any fluids worsening symptoms or if she has any additional concerns.   Final Clinical Impression(s) / ED Diagnoses Final diagnoses:  Nausea vomiting and diarrhea    Rx / DC Orders ED Discharge Orders         Ordered    ondansetron (ZOFRAN ODT) 4 MG disintegrating tablet  Every 8 hours PRN        05/29/20 1239           Luna Fuse, MD 05/29/20 1240

## 2020-05-29 NOTE — Discharge Instructions (Addendum)
Call your primary care doctor or specialist as discussed in the next 2-3 days.   Return immediately back to the ER if:  Your symptoms worsen within the next 12-24 hours. You develop new symptoms such as new fevers, persistent vomiting, new pain, shortness of breath, or new weakness or numbness, or if you have any other concerns.  

## 2020-06-04 ENCOUNTER — Encounter: Payer: Self-pay | Admitting: Family

## 2020-06-10 ENCOUNTER — Other Ambulatory Visit: Payer: Self-pay

## 2020-06-10 ENCOUNTER — Encounter: Payer: Self-pay | Admitting: Family

## 2020-06-10 ENCOUNTER — Ambulatory Visit: Payer: PRIVATE HEALTH INSURANCE | Admitting: Family

## 2020-06-10 VITALS — BP 120/86 | HR 93 | Temp 98.0°F | Resp 16 | Wt 163.0 lb

## 2020-06-10 DIAGNOSIS — M1990 Unspecified osteoarthritis, unspecified site: Secondary | ICD-10-CM

## 2020-06-10 DIAGNOSIS — G4700 Insomnia, unspecified: Secondary | ICD-10-CM | POA: Diagnosis not present

## 2020-06-10 DIAGNOSIS — J3489 Other specified disorders of nose and nasal sinuses: Secondary | ICD-10-CM

## 2020-06-10 DIAGNOSIS — E876 Hypokalemia: Secondary | ICD-10-CM | POA: Diagnosis not present

## 2020-06-10 DIAGNOSIS — K529 Noninfective gastroenteritis and colitis, unspecified: Secondary | ICD-10-CM

## 2020-06-10 DIAGNOSIS — F419 Anxiety disorder, unspecified: Secondary | ICD-10-CM

## 2020-06-10 DIAGNOSIS — G8929 Other chronic pain: Secondary | ICD-10-CM

## 2020-06-10 DIAGNOSIS — R251 Tremor, unspecified: Secondary | ICD-10-CM | POA: Diagnosis not present

## 2020-06-10 DIAGNOSIS — M542 Cervicalgia: Secondary | ICD-10-CM

## 2020-06-10 LAB — BASIC METABOLIC PANEL
BUN: 10 mg/dL (ref 6–23)
CO2: 27 mEq/L (ref 19–32)
Calcium: 9.4 mg/dL (ref 8.4–10.5)
Chloride: 104 mEq/L (ref 96–112)
Creatinine, Ser: 1 mg/dL (ref 0.40–1.20)
GFR: 63.57 mL/min (ref >60.00)
Glucose, Bld: 85 mg/dL (ref 70–99)
Potassium: 4.2 mEq/L (ref 3.5–5.1)
Sodium: 139 mEq/L (ref 135–145)

## 2020-06-10 MED ORDER — ONE-A-DAY WOMENS 50 PLUS PO TABS: 1.0000 | ORAL_TABLET | Freq: Every day | ORAL | Status: AC

## 2020-06-10 NOTE — Progress Notes (Signed)
Subjective:    Patient ID: Pamela Sanders, female    DOB: Nov 06, 1965, 55 y.o.   MRN: [id number]  HPI  Patient is a 55 yr old female who presents today for routine and ER follow up. She was seen on 12/22 due to nausea/vomitting and diarrhea. She was found to be dehydrated/hypokalemic and was give potassium supplementation and IVF in the ER.  She was sent home with prn zofran.  Insomnia- uses ambien 5mg  HS prn. She is using about 2x a week.    Depression- On wellbutrin 300mg  Xl once daily. She uses xanax 0.25mg  on a prn basis.   Anxiety- uses xanax prn-   GERD- uses otc pepcid bid. Reports that symptoms stable on bi regimen.   She is continues on gabapentin for chronic neck pain.   Chronic inflammatory arthritis- she is following with rheumatology and was given a trial of sufasalazine. She did not note improvement in her symptoms and had nausea- so this was discontinued. Plan is referral to pain management and physical therapy.   She will have a facet joint injection at [facility].  Using capsaicin ointment which helps.   She complained of some shaking in her hands to her rheumatologist.      Review of Systems See HPI  Past Medical History:  Diagnosis Date  . Allergic rhinitis   . Depression   . Depression (emotion)   . DJD (degenerative joint disease)    chronic neck pain  . GERD (gastroesophageal reflux disease)   . Hip pain   . History of diverticulitis of colon   . Hx of diverticulitis of colon   . Insomnia   . Lateral epicondylitis of right elbow   . Leukocytopenia   . Migraine   . Neck pain, chronic   . Osteoarthritis   . Pain of left thumb   . Pain of right sternoclavicular joint   . Phantosmia   . Sinusitis   . TMJ (dislocation of temporomandibular joint)      Social History   Socioeconomic History  . Marital status: Married    Spouse name: [name]  . Number of children: 0  . Years of education: Not on file  . Highest education level: Doctorate   Occupational History  . Occupation: Probation officer: [employer]  Tobacco Use  . Smoking status: Never Smoker  . Smokeless tobacco: Never Used  Vaping Use  . Vaping Use: Never used  Substance and Sexual Activity  . Alcohol use: Yes    Alcohol/week: 0.0 standard drinks    Comment: 2 drinks weekly, wine  . Drug use: No  . Sexual activity: Not on file  Other Topics Concern  . 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 She lives with her wife in a 2 story home. She drinks 1-2 cups of coffee a day and 1-2 glasses of tea. She exercises regularly.   Social Determinants of Health   Financial Resource Strain: Not on file  Food Insecurity: Not on file  Transportation Needs: Not on file  Physical Activity: Not on file  Stress: Not  on file  Social Connections: Not on file  Intimate Partner Violence: Not on file    Past Surgical History:  Procedure Laterality Date  . APPENDECTOMY    . CERVICAL DISCECTOMY     Dr. Harl Bowie 06/2011[facility]  . HEMORROIDECTOMY    . OVARIAN CYST SURGERY     abdominal surgery for ovarian cyst  . TISSUE GRAFT  02/07/15   pt reports gum graft for receeding gums--Dr Geralynn Ochs  . TMJ ARTHROSCOPY     TMJ surgery    Family History  Problem Relation Age of Onset  . Arthritis Other   . Breast cancer Other   . Coronary artery disease Maternal Grandmother   . Breast cancer Maternal Grandmother        carcinoid tumor in abd  . Hypertension Other   . Diverticulitis Mother   . Arthritis Mother   . Pancreatic cancer Other     Allergies  Allergen Reactions  . Celebrex [Celecoxib]     Headaches  . Penicillins Hives  . Shingrix [Zoster Vac Recomb Adjuvanted]     Fever,  myalgia, local redness    Current Outpatient Medications on File Prior to Visit  Medication Sig Dispense Refill  . augmented betamethasone dipropionate (DIPROLENE-AF) 0.05 % ointment Apply 1 application topically as needed.     Marland Kitchen b complex vitamins capsule Take 1 capsule by mouth daily.     . calcium citrate-vitamin D (CITRACAL+D) 315-200 MG-UNIT per tablet Take 2 tablets by mouth daily.    Marland Kitchen CAPSAICIN EX Apply topically 2 (two) times daily.    . diphenhydrAMINE (BENADRYL) 25 MG tablet Take 25 mg by mouth daily as needed for itching or allergies.     . famotidine (PEPCID AC) 10 MG chewable tablet Chew 10 mg by mouth 2 (two) times daily.    Marland Kitchen gabapentin (NEURONTIN) 300 MG capsule TAKE 2 CAPSULES 2 TIMES A DAY 360 capsule 1  . loratadine (CLARITIN) 10 MG tablet Take 10 mg by mouth daily.    . Melatonin 5 MG CAPS Take 5 mg by mouth at bedtime as needed (sleep).     . norethindrone (AYGESTIN) 5 MG tablet Take 10 mg by mouth as directed.    . Psyllium (METAMUCIL PO) Take by mouth.    . WELLBUTRIN XL 150 MG 24 hr tablet TAKE TWO TABLETS BY MOUTH DAILY 180 tablet 1  . zolpidem (AMBIEN) 5 MG tablet Take 1 tablet (5 mg total) by mouth at bedtime as needed. for sleep 30 tablet 2   No current facility-administered medications on file prior to visit.    BP 120/86 (BP Location: Right Arm, Patient Position: Sitting, Cuff Size: Small)   Pulse 93   Temp 98 F (36.7 C) (Temporal)   Resp 16   Wt 163 lb (73.9 kg)   SpO2 100%   BMI 25.53 kg/m       Objective:   Physical Exam Constitutional:      Appearance: She is well-developed and well-nourished.  HENT:     Nose:     Comments: Tender Filiform lesion, white in color inside left lateral nare Neck:     Thyroid: No thyromegaly.  Cardiovascular:     Rate and Rhythm: Normal rate and regular rhythm.     Heart sounds: Normal heart sounds. No murmur heard.   Pulmonary:     Effort: Pulmonary effort is normal. No respiratory distress.     Breath  sounds: Normal breath sounds. No wheezing.  Musculoskeletal:  Cervical back: Neck supple.  Skin:    General: Skin is warm and dry.  Neurological:     Mental Status: She is alert and oriented to person, place, and time.     Comments: Bilateral resting hand tremor  Psychiatric:        Mood and Affect: Mood and affect normal.        Behavior: Behavior normal.        Thought Content: Thought content normal.        Judgment: Judgment normal.           Assessment & Plan:  Nasal lesion- new. Painful. Refer to ENT for further evaluation.  Gastroenteritis- Clinically resolved. Monitor.  Hypokalemia- obtain follow up bmet.  GERD- stable on pepcid, continue same.   Chronic inflammatory Arthritis- reports overall stable currently.  Monitor.  Chronic neck pain- following with neurosurgery/pain management. Continue gabapentin.  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 I advised her to address this concern with her neurologist at her upcoming appointment.   This visit occurred during the SARS-CoV-2 public health emergency.  Safety protocols were in place, including screening questions prior to the visit, additional usage of staff PPE, and extensive cleaning of exam room while observing appropriate contact time as indicated for disinfecting solutions.

## 2020-06-11 LAB — DRUG MONITORING, PANEL 8 WITH CONFIRMATION, URINE
6 Acetylmorphine: NEGATIVE ng/mL (ref <10)
Alcohol Metabolites: NEGATIVE ng/mL
Amphetamines: NEGATIVE ng/mL (ref <500)
Benzodiazepines: NEGATIVE ng/mL (ref <100)
Buprenorphine, Urine: NEGATIVE ng/mL (ref <5)
Cocaine Metabolite: NEGATIVE ng/mL (ref <150)
Creatinine: 12.2 mg/dL
MDMA: NEGATIVE ng/mL (ref <500)
Marijuana Metabolite: NEGATIVE ng/mL (ref <20)
Opiates: NEGATIVE ng/mL (ref <100)
Oxidant: NEGATIVE ug/mL
Oxycodone: NEGATIVE ng/mL (ref <100)
Specific Gravity: 1.002 — ABNORMAL LOW (ref >=1.0)
pH: 6.9 (ref 4.5–9.0)

## 2020-06-11 LAB — DM TEMPLATE

## 2020-07-29 ENCOUNTER — Encounter: Payer: Self-pay | Admitting: Family Medicine

## 2020-08-01 DIAGNOSIS — M1990 Unspecified osteoarthritis, unspecified site: Secondary | ICD-10-CM | POA: Insufficient documentation

## 2020-08-01 DIAGNOSIS — G8929 Other chronic pain: Secondary | ICD-10-CM | POA: Insufficient documentation

## 2020-08-01 DIAGNOSIS — Z8719 Personal history of other diseases of the digestive system: Secondary | ICD-10-CM | POA: Insufficient documentation

## 2020-08-01 DIAGNOSIS — M79645 Pain in left finger(s): Secondary | ICD-10-CM | POA: Insufficient documentation

## 2020-08-01 DIAGNOSIS — K219 Gastro-esophageal reflux disease without esophagitis: Secondary | ICD-10-CM | POA: Insufficient documentation

## 2020-08-01 DIAGNOSIS — M542 Cervicalgia: Secondary | ICD-10-CM | POA: Insufficient documentation

## 2020-08-01 DIAGNOSIS — M25511 Pain in right shoulder: Secondary | ICD-10-CM | POA: Insufficient documentation

## 2020-08-01 DIAGNOSIS — J329 Chronic sinusitis, unspecified: Secondary | ICD-10-CM | POA: Insufficient documentation

## 2020-08-02 ENCOUNTER — Ambulatory Visit: Payer: PRIVATE HEALTH INSURANCE | Admitting: Family Medicine

## 2020-08-02 ENCOUNTER — Ambulatory Visit: Payer: Self-pay

## 2020-08-02 ENCOUNTER — Other Ambulatory Visit: Payer: Self-pay

## 2020-08-02 ENCOUNTER — Ambulatory Visit: Payer: PRIVATE HEALTH INSURANCE | Admitting: Cardiology

## 2020-08-02 ENCOUNTER — Encounter: Payer: Self-pay | Admitting: Cardiology

## 2020-08-02 VITALS — BP 118/64 | HR 88 | Ht 67.0 in | Wt 165.0 lb

## 2020-08-02 VITALS — BP 116/88 | Ht 67.0 in | Wt 162.0 lb

## 2020-08-02 DIAGNOSIS — R079 Chest pain, unspecified: Secondary | ICD-10-CM | POA: Diagnosis not present

## 2020-08-02 DIAGNOSIS — M7071 Other bursitis of hip, right hip: Secondary | ICD-10-CM | POA: Insufficient documentation

## 2020-08-02 DIAGNOSIS — M25551 Pain in right hip: Secondary | ICD-10-CM

## 2020-08-02 MED ORDER — METHYLPREDNISOLONE ACETATE 40 MG/ML IJ SUSP
40.0000 mg | Freq: Once | INTRAMUSCULAR | Status: AC
  Administered 2020-08-02: 40 mg via INTRAMUSCULAR

## 2020-08-02 NOTE — Patient Instructions (Signed)

## 2020-08-02 NOTE — Progress Notes (Signed)
Pamela Sanders - 55 y.o. female MRN [id number]  Date of birth: September 20, 1965  SUBJECTIVE:  Including CC & ROS.  No chief complaint on file.   Pamela Sanders is a 55 y.o. female that is presenting with right hamstring pain and pain origin of the right side pain is been ongoing for over a year.  Denies any injury or inciting event.  Does seem to be aggravated by the posture that she has to sit at work.   Review of Systems See HPI   HISTORY: Past Medical, Surgical, Social, and Family History Reviewed & Updated per EMR.   Pertinent Historical Findings include:  Past Medical History:  Diagnosis Date   Allergic rhinitis    Allergic rhinitis 03/07/2008   Qualifier: Diagnosis of  By: Redmond Pulling MD, Karren Cobble of this note might be different from the original. Overview:  Qualifier: Diagnosis of  By: Redmond Pulling MD, LauraLee   Chest pain 12/31/2019   Chest tightness 01/29/2020   Chronic inflammatory arthritis 03/07/2008   Qualifier: Diagnosis of  By: Redmond Pulling MD, Karren Cobble of this note might be different from the original. Overview:  Qualifier: Diagnosis of  By: Redmond Pulling MD, LauraLee   Depression    Depression (emotion)    DIVERTICULITIS, HX OF 03/07/2008   Qualifier: Diagnosis of  By: Redmond Pulling MD, LauraLee     DJD (degenerative joint disease)    chronic neck pain   General medical examination 12/03/2010   GERD 03/07/2008   Qualifier: Diagnosis of  By: Redmond Pulling MD, Karren Cobble of this note might be different from the original. Overview:  Qualifier: Diagnosis of  By: Redmond Pulling MD, Frann Rider   GERD (gastroesophageal reflux disease)    Hip pain    History of diverticulitis of colon    Hx of diverticulitis of colon    Insomnia    Lateral epicondylitis of right elbow    Leukocytopenia    Migraine    Migraine without aura 03/07/2008   Qualifier: Diagnosis of  By: Redmond Pulling MD, LauraLee     Neck pain, chronic    NECK PAIN, CHRONIC 03/07/2008   Qualifier: Diagnosis of   By: Redmond Pulling MD, LauraLee     Osteoarthritis    Pain of left thumb    Pain of right sternoclavicular joint    Phantosmia    Routine general medical examination at a health care facility 08/09/2013   Sinusitis    Sternoclavicular joint pain, right 10/15/2019   TMJ (dislocation of temporomandibular joint)    Tremor of both hands 03/08/2020    Past Surgical History:  Procedure Laterality Date   APPENDECTOMY     CERVICAL DISCECTOMY     Dr. Harl Bowie 11/2009- [facility]   HEMORROIDECTOMY     OVARIAN CYST SURGERY     abdominal surgery for ovarian cyst   TISSUE GRAFT  02/07/15   pt reports gum graft for receeding gums--Dr Geralynn Ochs   TMJ ARTHROSCOPY     TMJ surgery    Family History  Problem Relation Age of Onset   Arthritis Other    Breast cancer Other    Coronary artery disease Maternal Grandmother    Breast cancer Maternal Grandmother        carcinoid tumor in abd   Hypertension Other    Diverticulitis Mother    Arthritis Mother    Pancreatic cancer Other     Social History   Socioeconomic History   Marital status: Married    Spouse name:  [name]   Number of children: 0   Years of education: Not on file   Highest education level: Doctorate  Occupational History   Occupation: Probation officer: [employer]  Tobacco Use   Smoking status: Never Smoker   Smokeless tobacco: Never Used  Vaping Use   Vaping Use: Never used  Substance and Sexual Activity   Alcohol use: Yes    Alcohol/week: 0.0 standard drinks    Comment: 2 drinks weekly, wine   Drug use: No   Sexual activity: Not on file  Other Topics Concern   Not on file  Social History Narrative   Domestic Partner   no children   She grew up in [place], [place]    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 She lives  with her wife in a 2 story home. She drinks 1-2 cups of coffee a day and 1-2 glasses of tea. She exercises regularly.   Social Determinants of Health   Financial Resource Strain: Not on file  Food Insecurity: Not on file  Transportation Needs: Not on file  Physical Activity: Not on file  Stress: Not on file  Social Connections: Not on file  Intimate Partner Violence: Not on file     PHYSICAL EXAM:  VS: BP 116/88 (BP Location: Left Arm, Patient Position: Sitting, Cuff Size: Large)    Ht 5\' 7"  (1.702 m)    Wt 162 lb (73.5 kg)    BMI 25.37 kg/m  Physical Exam Gen: NAD, alert, cooperative with exam, well-appearing MSK:  Right hamstring: Normal range of motion. Tenderness to palpation at the ischial tuberosity on the right. No swelling or ecchymosis. Pain with resistance to extreme flexion. Neurovascularly intact  Limited ultrasound: Right hamstring:  Normal appearing origin of the semitendinosis Anechoic are of the between the conjoined tendon to suggest ischial bursitis. No hyperemia in the area  Summary: Findings would suggest ischial bursitis  Ultrasound and interpretation by Clearance Coots, MD   Aspiration/Injection Procedure Note Pamela Sanders [birth year]  Procedure: Injection Indications: Right hamstring pain  Procedure Details Consent: Risks of procedure as well as the alternatives and risks of each were explained to the (patient/caregiver).  Consent for procedure obtained. Time Out: Verified patient identification, verified procedure, site/side was marked, verified correct patient position, special equipment/implants available, medications/allergies/relevent history reviewed, required imaging and test results available.  Performed.  The area was cleaned with iodine and alcohol swabs.    The right ischial bursa was injected using 1 cc's of 40 mg Depo-Medrol and 4 cc's of 0.25% bupivacaine with a 22 3 1/2" needle.  Ultrasound was used. Images were obtained in short  views showing the injection.     A sterile dressing was applied.  Patient did tolerate procedure well.    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  Likely has underlying bursa changes but no significant tendinopathy or hyperemia. -Counseled on home exercise therapy and supportive care. -Injection. -Could consider physical therapy or shockwave or further imaging.

## 2020-08-02 NOTE — Assessment & Plan Note (Signed)
Acute on chronic in nature.  Likely has underlying bursa changes but no significant tendinopathy or hyperemia. -Counseled on home exercise therapy and supportive care. -Injection. -Could consider physical therapy or shockwave or further imaging.

## 2020-08-02 NOTE — Progress Notes (Signed)
Cardiology Office Note:    Date:  08/02/2020   ID:  Pamela Sanders, DOB [birth year], MRN [id number]  PCP:  [name], NP  Cardiologist:  Jenean Lindau, MD   Referring MD: [name], NP    ASSESSMENT:    No diagnosis found. PLAN:    In order of problems listed above:  1. Primary prevention stressed to the patient.  Importance of compliance with diet medication stressed and she vocalized understanding.  She has excellent effort tolerance.  She walks at least half an hour a day on a regular basis.  She uses her elliptical regularly. 2. I reviewed her blood pressure and lipids and found them to be excellent. 3. Chest discomfort: These have resolved the patient feels better now and is reassured and exercises regularly.  She will be seen in follow-up appointment on a as needed basis only.  She had multiple questions which were answered to her satisfaction.   Medication Adjustments/Labs and Tests Ordered: Current medicines are reviewed at length with the patient today.  Concerns regarding medicines are outlined above.  No orders of the defined types were placed in this encounter.  No orders of the defined types were placed in this encounter.    No chief complaint on file.    History of Present Illness:    Pamela Sanders is a 55 y.o. female.  Patient was evaluated by me for chest discomfort-like symptoms.  Her stress test and calcium score was excellent.  She did well on these.  She has no high blood pressure issues.  She denies any chest pain orthopnea or PND.  She exercises using her elliptical on a regular basis.  She has orthopedic issues involving her neck.  At the time of my evaluation, the patient is alert awake oriented and in no distress.  Past Medical History:  Diagnosis Date  . Allergic rhinitis   . Allergic rhinitis 03/07/2008   Qualifier: Diagnosis of  By: Redmond Pulling MD, Karren Cobble of this note might be different from the original. Overview:   Qualifier: Diagnosis of  By: Redmond Pulling MD, Frann Rider  . Chest pain 12/31/2019  . Chest tightness 01/29/2020  . Chronic inflammatory arthritis 03/07/2008   Qualifier: Diagnosis of  By: Redmond Pulling MD, Karren Cobble of this note might be different from the original. Overview:  Qualifier: Diagnosis of  By: Redmond Pulling MD, Frann Rider  . Depression   . Depression (emotion)   . DIVERTICULITIS, HX OF 03/07/2008   Qualifier: Diagnosis of  By: Redmond Pulling MD, Frann Rider    . DJD (degenerative joint disease)    chronic neck pain  . General medical examination 12/03/2010  . GERD 03/07/2008   Qualifier: Diagnosis of  By: Redmond Pulling MD, Karren Cobble of this note might be different from the original. Overview:  Qualifier: Diagnosis of  By: Redmond Pulling MD, Frann Rider  . GERD (gastroesophageal reflux disease)   . Hip pain   . History of diverticulitis of colon   . Hx of diverticulitis of colon   . Insomnia   . Lateral epicondylitis of right elbow   . Leukocytopenia   . Migraine   . Migraine without aura 03/07/2008   Qualifier: Diagnosis of  By: Redmond Pulling MD, Frann Rider    . Neck pain, chronic   . NECK PAIN, CHRONIC 03/07/2008   Qualifier: Diagnosis of  By: Redmond Pulling MD, Frann Rider    . Osteoarthritis   . Pain of left thumb   . Pain of right sternoclavicular  joint   . Phantosmia   . Routine general medical examination at a health care facility 08/09/2013  . Sinusitis   . Sternoclavicular joint pain, right 10/15/2019  . TMJ (dislocation of temporomandibular joint)   . Tremor of both hands 03/08/2020    Past Surgical History:  Procedure Laterality Date  . APPENDECTOMY    . CERVICAL DISCECTOMY     Dr. Harl Bowie 06/2011[facility]  . HEMORROIDECTOMY    . OVARIAN CYST SURGERY     abdominal surgery for ovarian cyst  . TISSUE GRAFT  02/07/15   pt reports gum graft for receeding gums--Dr Geralynn Ochs  . TMJ ARTHROSCOPY     TMJ surgery    Current Medications: Current Meds  Medication Sig  . augmented betamethasone dipropionate  (DIPROLENE-AF) 0.05 % ointment Apply 1 application topically as needed (eczema).  Marland Kitchen b complex vitamins capsule Take 1 capsule by mouth daily.   . calcium citrate-vitamin D (CITRACAL+D) 315-200 MG-UNIT per tablet Take 2 tablets by mouth daily.  Marland Kitchen CAPSAICIN EX Apply 1 application topically as needed (muscle pain).  Marland Kitchen diphenhydrAMINE (BENADRYL) 25 MG tablet Take 25 mg by mouth daily as needed for itching or allergies.   . famotidine (PEPCID AC) 10 MG chewable tablet Chew 10 mg by mouth 2 (two) times daily.  Marland Kitchen gabapentin (NEURONTIN) 300 MG capsule TAKE 2 CAPSULES 2 TIMES A DAY  . loratadine (CLARITIN) 10 MG tablet Take 10 mg by mouth daily.  . Melatonin 5 MG CAPS Take 5 mg by mouth at bedtime as needed (sleep).   . Multiple Vitamins-Minerals (ONE-A-DAY WOMENS 50 PLUS) TABS Take 1 tablet by mouth daily.  . norethindrone (AYGESTIN) 5 MG tablet Take 10 mg by mouth every 7 (seven) days. A month  . Psyllium (METAMUCIL PO) Take 0.5 fluid ounces by mouth daily.  . WELLBUTRIN XL 150 MG 24 hr tablet TAKE TWO TABLETS BY MOUTH DAILY  . zolpidem (AMBIEN) 5 MG tablet Take 1 tablet (5 mg total) by mouth at bedtime as needed. for sleep     Allergies:   Celebrex [celecoxib], Penicillins, and Shingrix [zoster vac recomb adjuvanted]   Social History   Socioeconomic History  . Marital status: Married    Spouse name: [name]  . Number of children: 0  . Years of education: Not on file  . Highest education level: Doctorate  Occupational History  . Occupation: Probation officer: [employer]  Tobacco Use  . Smoking status: Never Smoker  . Smokeless tobacco: Never Used  Vaping Use  . Vaping Use: Never used  Substance and Sexual Activity  . Alcohol use: Yes    Alcohol/week: 0.0 standard drinks    Comment: 2 drinks weekly, wine  . Drug use: No  . Sexual activity: Not on file  Other Topics Concern  . 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 She lives with her wife in a 2 story home. She drinks 1-2 cups of coffee a day and 1-2 glasses of tea. She exercises regularly.   Social Determinants of Health   Financial Resource Strain: Not on file  Food Insecurity: Not on file  Transportation Needs: Not on file  Physical Activity: Not on file  Stress: Not on file  Social Connections: Not on file     Family History: The patient's family history includes Arthritis in her mother and another family member; Breast cancer in her maternal grandmother and another family member; Coronary artery disease in her maternal grandmother; Diverticulitis in her mother; Hypertension in an other family member; Pancreatic cancer in an other family member.  ROS:   Please see the history of present illness.    All other systems reviewed and are negative.  EKGs/Labs/Other Studies Reviewed:    The following studies were reviewed today: IMPRESSION: Coronary calcium score of 0.   Electronically Signed   By: Pixie Casino M.D.   On: 02/26/2020 15:10   Recent Labs: 09/25/2019: TSH 3.41 05/29/2020: ALT 20; Hemoglobin 13.6; Platelets 187 06/10/2020: BUN 10; Creatinine, Ser 1.00; Potassium 4.2; Sodium 139  Recent Lipid Panel    Component Value Date/Time   CHOL 175 09/25/2019 1123   TRIG 157.0 (H) 09/25/2019 1123   HDL 44.40 09/25/2019 1123   CHOLHDL 4 09/25/2019 1123   VLDL 31.4 09/25/2019 1123   LDLCALC 99 09/25/2019 1123    Physical Exam:    VS:  BP 118/64   Pulse 88   Ht 5\' 7"  (1.702 m)   Wt 165 lb 0.6 oz (74.9 kg)   SpO2 98%   BMI 25.85 kg/m     Wt Readings from Last 3 Encounters:  08/02/20 165 lb 0.6 oz (74.9 kg)  08/02/20 162 lb (73.5 kg)  06/10/20 163 lb (73.9 kg)     GEN: Patient is in no acute  distress HEENT: Normal NECK: No JVD; No carotid bruits LYMPHATICS: No lymphadenopathy CARDIAC: Hear sounds regular, 2/6 systolic murmur at the apex. RESPIRATORY:  Clear to auscultation without rales, wheezing or rhonchi  ABDOMEN: Soft, non-tender, non-distended MUSCULOSKELETAL:  No edema; No deformity  SKIN: Warm and dry NEUROLOGIC:  Alert and oriented x 3 PSYCHIATRIC:  Normal affect   Signed, Jenean Lindau, MD  08/02/2020 10:15 AM    [place]

## 2020-08-02 NOTE — Patient Instructions (Signed)
Good to see you Please try heat  Please try the exercises   Please send me a message in MyChart with any questions or updates.  Please see me back in 4 weeks .   --Dr. Julieanne Hadsall  

## 2020-08-02 NOTE — Addendum Note (Signed)
Addended by: Cresenciano Lick on: 08/02/2020 10:10 AM   Modules accepted: Orders

## 2020-08-15 ENCOUNTER — Other Ambulatory Visit: Payer: Self-pay | Admitting: Family

## 2020-08-19 ENCOUNTER — Other Ambulatory Visit: Payer: Self-pay | Admitting: Family

## 2020-08-20 ENCOUNTER — Other Ambulatory Visit: Payer: Self-pay | Admitting: Family

## 2020-08-20 MED ORDER — WELLBUTRIN XL 150 MG PO TB24: 300.0000 mg | ORAL_TABLET | Freq: Every day | ORAL | 1 refills | Status: DC

## 2020-08-20 NOTE — Addendum Note (Signed)
Addended by: [name] on: 08/20/2020 04:50 PM   Modules accepted: Orders

## 2020-08-21 ENCOUNTER — Encounter: Payer: Self-pay | Admitting: Family

## 2020-08-22 ENCOUNTER — Telehealth: Payer: Self-pay

## 2020-08-22 ENCOUNTER — Telehealth: Payer: Self-pay | Admitting: Family

## 2020-08-22 MED ORDER — BUPROPION HCL ER (XL) 150 MG PO TB24: 300.0000 mg | ORAL_TABLET | Freq: Every day | ORAL | 0 refills | Status: DC

## 2020-08-22 NOTE — Telephone Encounter (Signed)
Medication:    Has the patient contacted their pharmacy? no (If no, request that the patient contact the pharmacy for the refill.) (If yes, when and what did the pharmacy advise?)    Preferred Pharmacy (with phone number or street name):    Agent: Please be advised that RX refills may take up to 3 business days. We ask that you follow-up with your pharmacy.

## 2020-08-22 NOTE — Telephone Encounter (Signed)
Patient advised every time rx is sent pharmacy will automatically run by insurance. We will go ahead and submit PA.  She has the option of going to the pharmacy and tell them she wants to pay cash and use her coupon as well.

## 2020-08-22 NOTE — Telephone Encounter (Signed)
Rx was sent today PA will be initiated

## 2020-08-22 NOTE — Telephone Encounter (Signed)
Pamela Sanders, can we please initiate prior auth paperwork for Wellbutrin?  tks

## 2020-08-22 NOTE — Telephone Encounter (Signed)
Caller states she needs to have Wellbutrin pre authorized by [name], NP. Caller fills at [facility]

## 2020-08-23 NOTE — Telephone Encounter (Signed)
Patient advised PA was sent yesterday

## 2020-08-28 NOTE — Telephone Encounter (Signed)
Patient called in reference to medication prior authorization for Wellbutrin XL, Patient notified that PA was denied on yesterday and additional  information to complete PA was resent to insurance company per [place].   Patient also states that she may need additional  medication to hold her over until name brand medication is approved.

## 2020-08-28 NOTE — Telephone Encounter (Signed)
Pharmacy advised to change to generic and call patient for pick up today when ready.

## 2020-08-29 ENCOUNTER — Ambulatory Visit: Payer: Self-pay

## 2020-08-29 ENCOUNTER — Other Ambulatory Visit: Payer: Self-pay

## 2020-08-29 ENCOUNTER — Encounter: Payer: Self-pay | Admitting: Family Medicine

## 2020-08-29 ENCOUNTER — Ambulatory Visit: Payer: PRIVATE HEALTH INSURANCE | Admitting: Family Medicine

## 2020-08-29 VITALS — BP 110/80 | Ht 67.0 in | Wt 162.0 lb

## 2020-08-29 DIAGNOSIS — M23303 Other meniscus derangements, unspecified medial meniscus, right knee: Secondary | ICD-10-CM | POA: Diagnosis not present

## 2020-08-29 DIAGNOSIS — M25561 Pain in right knee: Secondary | ICD-10-CM

## 2020-08-29 NOTE — Progress Notes (Signed)
Pamela Sanders - 55 y.o. female MRN [id number]  Date of birth: 08-Jan-1966  SUBJECTIVE:  Including CC & ROS.  No chief complaint on file.   Pamela Sanders is a 55 y.o. female that is presenting with acute right knee pain.  She is having pain over the medial compartment.  Denies any injury or inciting event.  Reports having surgery several years ago.   Review of Systems See HPI   HISTORY: Past Medical, Surgical, Social, and Family History Reviewed & Updated per EMR.   Pertinent Historical Findings include:  Past Medical History:  Diagnosis Date  . Allergic rhinitis   . Allergic rhinitis 03/07/2008   Qualifier: Diagnosis of  By: Redmond Pulling MD, Karren Cobble of this note might be different from the original. Overview:  Qualifier: Diagnosis of  By: Redmond Pulling MD, Frann Rider  . Chest pain 12/31/2019  . Chest tightness 01/29/2020  . Chronic inflammatory arthritis 03/07/2008   Qualifier: Diagnosis of  By: Redmond Pulling MD, Karren Cobble of this note might be different from the original. Overview:  Qualifier: Diagnosis of  By: Redmond Pulling MD, Frann Rider  . Depression   . Depression (emotion)   . DIVERTICULITIS, HX OF 03/07/2008   Qualifier: Diagnosis of  By: Redmond Pulling MD, Frann Rider    . DJD (degenerative joint disease)    chronic neck pain  . General medical examination 12/03/2010  . GERD 03/07/2008   Qualifier: Diagnosis of  By: Redmond Pulling MD, Karren Cobble of this note might be different from the original. Overview:  Qualifier: Diagnosis of  By: Redmond Pulling MD, Frann Rider  . GERD (gastroesophageal reflux disease)   . Hip pain   . History of diverticulitis of colon   . Hx of diverticulitis of colon   . Insomnia   . Lateral epicondylitis of right elbow   . Leukocytopenia   . Migraine   . Migraine without aura 03/07/2008   Qualifier: Diagnosis of  By: Redmond Pulling MD, Frann Rider    . Neck pain, chronic   . NECK PAIN, CHRONIC 03/07/2008   Qualifier: Diagnosis of  By: Redmond Pulling MD, Frann Rider    . Osteoarthritis   .  Pain of left thumb   . Pain of right sternoclavicular joint   . Phantosmia   . Routine general medical examination at a health care facility 08/09/2013  . Sinusitis   . Sternoclavicular joint pain, right 10/15/2019  . TMJ (dislocation of temporomandibular joint)   . Tremor of both hands 03/08/2020    Past Surgical History:  Procedure Laterality Date  . APPENDECTOMY    . CERVICAL DISCECTOMY     Dr. Harl [facility]  . HEMORROIDECTOMY    . OVARIAN CYST SURGERY     abdominal surgery for ovarian cyst  . TISSUE GRAFT  02/07/15   pt reports gum graft for receeding gums--Dr Geralynn Ochs  . TMJ ARTHROSCOPY     TMJ surgery    Family History  Problem Relation Age of Onset  . Arthritis Other   . Breast cancer Other   . Coronary artery disease Maternal Grandmother   . Breast cancer Maternal Grandmother        carcinoid tumor in abd  . Hypertension Other   . Diverticulitis Mother   . Arthritis Mother   . Pancreatic cancer Other     Social History   Socioeconomic History  . Marital status: Married    Spouse name: [name]  . Number of children: 0  . Years of education: Not on  file  . Highest education level: Doctorate  Occupational History  . Occupation: Probation officer: [employer]  Tobacco Use  . Smoking status: Never Smoker  . Smokeless tobacco: Never Used  Vaping Use  . Vaping Use: Never used  Substance and Sexual Activity  . Alcohol use: Yes    Alcohol/week: 0.0 standard drinks    Comment: 2 drinks weekly, wine  . Drug use: No  . Sexual activity: Not on file  Other Topics Concern  . 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 She lives with her wife in a 2 story home. She drinks 1-2  cups of coffee a day and 1-2 glasses of tea. She exercises regularly.   Social Determinants of Health   Financial Resource Strain: Not on file  Food Insecurity: Not on file  Transportation Needs: Not on file  Physical Activity: Not on file  Stress: Not on file  Social Connections: Not on file  Intimate Partner Violence: Not on file     PHYSICAL EXAM:  VS: BP 110/80 (BP Location: Left Arm, Patient Position: Sitting, Cuff Size: Normal)   Ht 5\' 7"  (1.702 m)   Wt 162 lb (73.5 kg)   BMI 25.37 kg/m  Physical Exam Gen: NAD, alert, cooperative with exam, well-appearing MSK:  Right knee: No effusion. Normal range of motion. Tenderness palpation of the medial joint space. Neurovascular intact  Limited ultrasound: Right knee:  No effusion suprapatellar pouch. Normal-appearing quadricep patellar tendons. There is minor degenerative changes of the medial meniscus. 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  Likely exacerbated with the resolving ischial bursitis on the right side as well. -Counseled on home exercise therapy and supportive care. -Reaction brace. -Counseled on Pennsaid. -Could consider imaging or physical therapy.

## 2020-08-29 NOTE — Telephone Encounter (Signed)
Patient calling to check status of PA for medication Wellbutrin XL , Patient would like a call back.

## 2020-08-29 NOTE — Assessment & Plan Note (Signed)
Has minor changes of the medial meniscus.  Likely exacerbated with the resolving ischial bursitis on the right side as well. -Counseled on home exercise therapy and supportive care. -Reaction brace. -Counseled on Pennsaid. -Could consider imaging or physical therapy.

## 2020-08-29 NOTE — Patient Instructions (Signed)
Good to see you Please try ice  Please try the pennsaid  Please try the exercises   Please send me a message in MyChart with any questions or updates.  Please see me back in 4 weeks.   --Dr. Raeford Razor

## 2020-09-02 NOTE — Telephone Encounter (Signed)
Patient was advised 3-24 that PA had been approved

## 2020-09-12 ENCOUNTER — Encounter: Payer: Self-pay | Admitting: Family

## 2020-09-12 DIAGNOSIS — M1990 Unspecified osteoarthritis, unspecified site: Secondary | ICD-10-CM

## 2020-09-18 LAB — HM MAMMOGRAPHY

## 2020-09-26 ENCOUNTER — Other Ambulatory Visit: Payer: Self-pay

## 2020-09-26 ENCOUNTER — Ambulatory Visit: Payer: PRIVATE HEALTH INSURANCE | Admitting: Family Medicine

## 2020-09-26 ENCOUNTER — Encounter: Payer: Self-pay | Admitting: Family Medicine

## 2020-09-26 DIAGNOSIS — M7071 Other bursitis of hip, right hip: Secondary | ICD-10-CM | POA: Diagnosis not present

## 2020-09-26 DIAGNOSIS — M23303 Other meniscus derangements, unspecified medial meniscus, right knee: Secondary | ICD-10-CM | POA: Diagnosis not present

## 2020-09-26 DIAGNOSIS — S161XXA Strain of muscle, fascia and tendon at neck level, initial encounter: Secondary | ICD-10-CM | POA: Diagnosis not present

## 2020-09-26 NOTE — Assessment & Plan Note (Signed)
Seems to start aggravating again.  Did get initial improvement with the injection. -Counseled on home exercise therapy and supportive care. -Could consider physical therapy. -Pursue shockwave therapy.

## 2020-09-26 NOTE — Assessment & Plan Note (Signed)
Has had improvement with home therapy. -Counseled on home exercise therapy and supportive care. -Follow-up as needed.

## 2020-09-26 NOTE — Patient Instructions (Signed)
Good to see you Please continue the range of motion for the neck   Please send me a message in MyChart with any questions or updates.  Please schedule a shockwave for the hamstring.   --Dr. Raeford Razor

## 2020-09-26 NOTE — Progress Notes (Signed)
Pamela Sanders - 55 y.o. female MRN [id number]  Date of birth: 01/16/1966  SUBJECTIVE:  Including CC & ROS.  No chief complaint on file.   Pamela Sanders is a 55 y.o. female that is following up for her right knee.  Her knee is doing well since last night.  She is having worsening of the ischial bursitis.  She also has some left-sided neck and shoulder pain.   Review of Systems See HPI   HISTORY: Past Medical, Surgical, Social, and Family History Reviewed & Updated per EMR.   Pertinent Historical Findings include:  Past Medical History:  Diagnosis Date  . Allergic rhinitis   . Allergic rhinitis 03/07/2008   Qualifier: Diagnosis of  By: Redmond Pulling MD, Karren Cobble of this note might be different from the original. Overview:  Qualifier: Diagnosis of  By: Redmond Pulling MD, Frann Rider  . Chest pain 12/31/2019  . Chest tightness 01/29/2020  . Chronic inflammatory arthritis 03/07/2008   Qualifier: Diagnosis of  By: Redmond Pulling MD, Karren Cobble of this note might be different from the original. Overview:  Qualifier: Diagnosis of  By: Redmond Pulling MD, Frann Rider  . Depression   . Depression (emotion)   . DIVERTICULITIS, HX OF 03/07/2008   Qualifier: Diagnosis of  By: Redmond Pulling MD, Frann Rider    . DJD (degenerative joint disease)    chronic neck pain  . General medical examination 12/03/2010  . GERD 03/07/2008   Qualifier: Diagnosis of  By: Redmond Pulling MD, Karren Cobble of this note might be different from the original. Overview:  Qualifier: Diagnosis of  By: Redmond Pulling MD, Frann Rider  . GERD (gastroesophageal reflux disease)   . Hip pain   . History of diverticulitis of colon   . Hx of diverticulitis of colon   . Insomnia   . Lateral epicondylitis of right elbow   . Leukocytopenia   . Migraine   . Migraine without aura 03/07/2008   Qualifier: Diagnosis of  By: Redmond Pulling MD, Frann Rider    . Neck pain, chronic   . NECK PAIN, CHRONIC 03/07/2008   Qualifier: Diagnosis of  By: Redmond Pulling MD, Frann Rider    .  Osteoarthritis   . Pain of left thumb   . Pain of right sternoclavicular joint   . Phantosmia   . Routine general medical examination at a health care facility 08/09/2013  . Sinusitis   . Sternoclavicular joint pain, right 10/15/2019  . TMJ (dislocation of temporomandibular joint)   . Tremor of both hands 03/08/2020    Past Surgical History:  Procedure Laterality Date  . APPENDECTOMY    . CERVICAL DISCECTOMY     Dr. Harl Bowie [facility]  . HEMORROIDECTOMY    . OVARIAN CYST SURGERY     abdominal surgery for ovarian cyst  . TISSUE GRAFT  02/07/15   pt reports gum graft for receeding gums--Dr Geralynn Ochs  . TMJ ARTHROSCOPY     TMJ surgery    Family History  Problem Relation Age of Onset  . Arthritis Other   . Breast cancer Other   . Coronary artery disease Maternal Grandmother   . Breast cancer Maternal Grandmother        carcinoid tumor in abd  . Hypertension Other   . Diverticulitis Mother   . Arthritis Mother   . Pancreatic cancer Other     Social History   Socioeconomic History  . Marital status: Married    Spouse name: [name]  . Number of children: 0  .  Years of education: Not on file  . Highest education level: Doctorate  Occupational History  . Occupation: Probation officer: [employer]  Tobacco Use  . Smoking status: Never Smoker  . Smokeless tobacco: Never Used  Vaping Use  . Vaping Use: Never used  Substance and Sexual Activity  . Alcohol use: Yes    Alcohol/week: 0.0 standard drinks    Comment: 2 drinks weekly, wine  . Drug use: No  . Sexual activity: Not on file  Other Topics Concern  . Not on file  Social History Narrative   Domestic Partner   no children   She grew up in 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 She lives with her wife in a 2 story  home. She drinks 1-2 cups of coffee a day and 1-2 glasses of tea. She exercises regularly.   Social Determinants of Health   Financial Resource Strain: Not on file  Food Insecurity: Not on file  Transportation Needs: Not on file  Physical Activity: Not on file  Stress: Not on file  Social Connections: Not on file  Intimate Partner Violence: Not on file     PHYSICAL EXAM:  VS: BP 118/72 (BP Location: Left Arm, Patient Position: Sitting, Cuff Size: Normal)   Ht 5\' 7"  (1.702 m)   Wt 162 lb (73.5 kg)   BMI 25.37 kg/m  Physical Exam Gen: NAD, alert, cooperative with exam, well-appearing MSK:  Neck: Limited lateral rotation to the left when compared to the right. No signs of atrophy. Normal range of motion of left shoulder. Normal strength resistance. Neurovascular intact     ASSESSMENT & PLAN:   Ischial bursitis of right side Seems to start aggravating again.  Did get initial improvement with the injection. -Counseled on home exercise therapy and supportive care. -Could consider physical therapy. -Pursue shockwave therapy.  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 -Counseled on home exercise therapy and supportive care. -Follow-up as needed.  Neck strain, initial encounter Having some limitation in range of motion laterally with the left and compared to the right.  This likely leading to some of the pain that she feels in the trapezius.  No signs of muscle atrophy. -Counseled on home exercise therapy and supportive care. -Could consider trigger point injections.

## 2020-09-26 NOTE — Assessment & Plan Note (Signed)
Having some limitation in range of motion laterally with the left and compared to the right.  This likely leading to some of the pain that she feels in the trapezius.  No signs of muscle atrophy. -Counseled on home exercise therapy and supportive care. -Could consider trigger point injections.

## 2020-09-30 ENCOUNTER — Ambulatory Visit: Payer: Self-pay | Admitting: Family Medicine

## 2020-09-30 ENCOUNTER — Other Ambulatory Visit: Payer: Self-pay

## 2020-09-30 DIAGNOSIS — M7071 Other bursitis of hip, right hip: Secondary | ICD-10-CM

## 2020-09-30 NOTE — Progress Notes (Signed)
Pamela Sanders - 55 y.o. female MRN [id number]  Date of birth: 02/22/1966  SUBJECTIVE:  Including CC & ROS.  No chief complaint on file.   Pamela Sanders is a 55 y.o. female that is presenting for her first shockwave therapy.   Review of Systems See HPI   HISTORY: Past Medical, Surgical, Social, and Family History Reviewed & Updated per EMR.   Pertinent Historical Findings include:  Past Medical History:  Diagnosis Date  . Allergic rhinitis   . Allergic rhinitis 03/07/2008   Qualifier: Diagnosis of  By: Redmond Pulling MD, Karren Cobble of this note might be different from the original. Overview:  Qualifier: Diagnosis of  By: Redmond Pulling MD, Frann Rider  . Chest pain 12/31/2019  . Chest tightness 01/29/2020  . Chronic inflammatory arthritis 03/07/2008   Qualifier: Diagnosis of  By: Redmond Pulling MD, Karren Cobble of this note might be different from the original. Overview:  Qualifier: Diagnosis of  By: Redmond Pulling MD, Frann Rider  . Depression   . Depression (emotion)   . DIVERTICULITIS, HX OF 03/07/2008   Qualifier: Diagnosis of  By: Redmond Pulling MD, Frann Rider    . DJD (degenerative joint disease)    chronic neck pain  . General medical examination 12/03/2010  . GERD 03/07/2008   Qualifier: Diagnosis of  By: Redmond Pulling MD, Karren Cobble of this note might be different from the original. Overview:  Qualifier: Diagnosis of  By: Redmond Pulling MD, Frann Rider  . GERD (gastroesophageal reflux disease)   . Hip pain   . History of diverticulitis of colon   . Hx of diverticulitis of colon   . Insomnia   . Lateral epicondylitis of right elbow   . Leukocytopenia   . Migraine   . Migraine without aura 03/07/2008   Qualifier: Diagnosis of  By: Redmond Pulling MD, Frann Rider    . Neck pain, chronic   . NECK PAIN, CHRONIC 03/07/2008   Qualifier: Diagnosis of  By: Redmond Pulling MD, Frann Rider    . Osteoarthritis   . Pain of left thumb   . Pain of right sternoclavicular joint   . Phantosmia   . Routine general medical examination at a  health care facility 08/09/2013  . Sinusitis   . Sternoclavicular joint pain, right 10/15/2019  . TMJ (dislocation of temporomandibular joint)   . Tremor of both hands 03/08/2020    Past Surgical History:  Procedure Laterality Date  . APPENDECTOMY    . CERVICAL DISCECTOMY     Dr. Harl Bowie 06/2011[facility]  . HEMORROIDECTOMY    . OVARIAN CYST SURGERY     abdominal surgery for ovarian cyst  . TISSUE GRAFT  02/07/15   pt reports gum graft for receeding gums--Dr Geralynn Ochs  . TMJ ARTHROSCOPY     TMJ surgery    Family History  Problem Relation Age of Onset  . Arthritis Other   . Breast cancer Other   . Coronary artery disease Maternal Grandmother   . Breast cancer Maternal Grandmother        carcinoid tumor in abd  . Hypertension Other   . Diverticulitis Mother   . Arthritis Mother   . Pancreatic cancer Other     Social History   Socioeconomic History  . Marital status: Married    Spouse name: [name]  . Number of children: 0  . Years of education: Not on file  . Highest education level: Doctorate  Occupational History  . Occupation: Probation officer: [employer]  Tobacco Use  . Smoking status: Never Smoker  . Smokeless tobacco: Never Used  Vaping Use  . Vaping Use: Never used  Substance and Sexual Activity  . Alcohol use: Yes    Alcohol/week: 0.0 standard drinks    Comment: 2 drinks weekly, wine  . Drug use: No  . Sexual activity: Not on file  Other Topics Concern  . 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 She lives with her wife in a 2 story home. She drinks 1-2 cups of coffee a day and 1-2 glasses of tea. She exercises regularly.   Social Determinants of Health   Financial  Resource Strain: Not on file  Food Insecurity: Not on file  Transportation Needs: Not on file  Physical Activity: Not on file  Stress: Not on file  Social Connections: Not on file  Intimate Partner Violence: Not on file     PHYSICAL EXAM:  VS: Ht 5\' 7"  (1.702 m)   Wt 162 lb (73.5 kg)   BMI 25.37 kg/m  Physical Exam Gen: NAD, alert, cooperative with exam, well-appearing  ECSWT Note Pamela Sanders May 07, 1966  Procedure: ECSWT Indications: right ischial bursitis   Procedure Details Consent: Risks of procedure as well as the alternatives and risks of each were explained to the (patient/caregiver).  Consent for procedure obtained. Time Out: Verified patient identification, verified procedure, site/side was marked, verified correct patient position, special equipment/implants available, medications/allergies/relevent history reviewed, required imaging and test results available.  Performed.  The area was cleaned with iodine and alcohol swabs.    The right hamstring origin was targeted for Extracorporeal shockwave therapy.   Preset: Greater trochanteric bursitis Power Level: 60 Frequency: 10 Impulse/cycles: 2000 Head size: Large   Patient did tolerate procedure well.     ASSESSMENT & PLAN:   Ischial bursitis of right side Completed for shockwave today. (1/4) -Follow-up in 1 week.

## 2020-09-30 NOTE — Assessment & Plan Note (Signed)
Completed for shockwave today. (1/4) -Follow-up in 1 week.

## 2020-10-07 ENCOUNTER — Other Ambulatory Visit: Payer: Self-pay

## 2020-10-07 ENCOUNTER — Ambulatory Visit: Payer: Self-pay | Admitting: Family Medicine

## 2020-10-07 ENCOUNTER — Encounter: Payer: Self-pay | Admitting: Family Medicine

## 2020-10-07 DIAGNOSIS — M7071 Other bursitis of hip, right hip: Secondary | ICD-10-CM

## 2020-10-07 NOTE — Progress Notes (Signed)
Pamela Sanders - 55 y.o. female MRN [id number]  Date of birth: February 04, 1966  SUBJECTIVE:  Including CC & ROS.  No chief complaint on file.   Pamela Sanders is a 55 y.o. female that is here for second shockwave therapy.    Review of Systems See HPI   HISTORY: Past Medical, Surgical, Social, and Family History Reviewed & Updated per EMR.   Pertinent Historical Findings include:  Past Medical History:  Diagnosis Date  . Allergic rhinitis   . Allergic rhinitis 03/07/2008   Qualifier: Diagnosis of  By: Redmond Pulling MD, Karren Cobble of this note might be different from the original. Overview:  Qualifier: Diagnosis of  By: Redmond Pulling MD, Frann Rider  . Chest pain 12/31/2019  . Chest tightness 01/29/2020  . Chronic inflammatory arthritis 03/07/2008   Qualifier: Diagnosis of  By: Redmond Pulling MD, Karren Cobble of this note might be different from the original. Overview:  Qualifier: Diagnosis of  By: Redmond Pulling MD, Frann Rider  . Depression   . Depression (emotion)   . DIVERTICULITIS, HX OF 03/07/2008   Qualifier: Diagnosis of  By: Redmond Pulling MD, Frann Rider    . DJD (degenerative joint disease)    chronic neck pain  . General medical examination 12/03/2010  . GERD 03/07/2008   Qualifier: Diagnosis of  By: Redmond Pulling MD, Karren Cobble of this note might be different from the original. Overview:  Qualifier: Diagnosis of  By: Redmond Pulling MD, Frann Rider  . GERD (gastroesophageal reflux disease)   . Hip pain   . History of diverticulitis of colon   . Hx of diverticulitis of colon   . Insomnia   . Lateral epicondylitis of right elbow   . Leukocytopenia   . Migraine   . Migraine without aura 03/07/2008   Qualifier: Diagnosis of  By: Redmond Pulling MD, Frann Rider    . Neck pain, chronic   . NECK PAIN, CHRONIC 03/07/2008   Qualifier: Diagnosis of  By: Redmond Pulling MD, Frann Rider    . Osteoarthritis   . Pain of left thumb   . Pain of right sternoclavicular joint   . Phantosmia   . Routine general medical examination at a health care  facility 08/09/2013  . Sinusitis   . Sternoclavicular joint pain, right 10/15/2019  . TMJ (dislocation of temporomandibular joint)   . Tremor of both hands 03/08/2020    Past Surgical History:  Procedure Laterality Date  . APPENDECTOMY    . CERVICAL DISCECTOMY     Dr. Harl Bowie 06/2011[facility]  . HEMORROIDECTOMY    . OVARIAN CYST SURGERY     abdominal surgery for ovarian cyst  . TISSUE GRAFT  02/07/15   pt reports gum graft for receeding gums--Dr Geralynn Ochs  . TMJ ARTHROSCOPY     TMJ surgery    Family History  Problem Relation Age of Onset  . Arthritis Other   . Breast cancer Other   . Coronary artery disease Maternal Grandmother   . Breast cancer Maternal Grandmother        carcinoid tumor in abd  . Hypertension Other   . Diverticulitis Mother   . Arthritis Mother   . Pancreatic cancer Other     Social History   Socioeconomic History  . Marital status: Married    Spouse name: [name]  . Number of children: 0  . Years of education: Not on file  . Highest education level: Doctorate  Occupational History  . Occupation: Probation officer: [employer]  Tobacco Use  . Smoking status: Never Smoker  . Smokeless tobacco: Never Used  Vaping Use  . Vaping Use: Never used  Substance and Sexual Activity  . Alcohol use: Yes    Alcohol/week: 0.0 standard drinks    Comment: 2 drinks weekly, wine  . Drug use: No  . Sexual activity: Not on file  Other Topics Concern  . 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 She lives with her wife in a 2 story home. She drinks 1-2 cups of coffee a day and 1-2 glasses of tea. She exercises regularly.   Social Determinants of Health   Financial Resource Strain:  Not on file  Food Insecurity: Not on file  Transportation Needs: Not on file  Physical Activity: Not on file  Stress: Not on file  Social Connections: Not on file  Intimate Partner Violence: Not on file     PHYSICAL EXAM:  VS: BP 118/80 (BP Location: Left Arm, Patient Position: Sitting, Cuff Size: Normal)   Ht 5\' 7"  (1.702 m)   Wt 162 lb (73.5 kg)   BMI 25.37 kg/m  Physical Exam Gen: NAD, alert, cooperative with exam, well-appearing   ECSWT Note Pamela Sanders July 17, 1965  Procedure: ECSWT Indications: right ischial bursitis   Procedure Details Consent: Risks of procedure as well as the alternatives and risks of each were explained to the (patient/caregiver).  Consent for procedure obtained. Time Out: Verified patient identification, verified procedure, site/side was marked, verified correct patient position, special equipment/implants available, medications/allergies/relevent history reviewed, required imaging and test results available.  Performed.  The area was cleaned with iodine and alcohol swabs.    The right ischial bursa was targeted for Extracorporeal shockwave therapy.   Preset: Trochanter bursitis  Power Level: 70  Frequency: 10 Impulse/cycles: 2300 Head size: large    Patient did tolerate procedure well.     ASSESSMENT & PLAN:   Ischial bursitis of right side Second shockwave therapy.  - f/u in one week.

## 2020-10-07 NOTE — Assessment & Plan Note (Signed)
Second shockwave therapy.  - f/u in one week.

## 2020-10-14 ENCOUNTER — Encounter: Payer: Self-pay | Admitting: Family Medicine

## 2020-10-14 ENCOUNTER — Other Ambulatory Visit: Payer: Self-pay

## 2020-10-14 ENCOUNTER — Ambulatory Visit: Payer: Self-pay | Admitting: Family Medicine

## 2020-10-14 VITALS — Ht 67.0 in | Wt 162.0 lb

## 2020-10-14 DIAGNOSIS — M7071 Other bursitis of hip, right hip: Secondary | ICD-10-CM

## 2020-10-14 DIAGNOSIS — Z981 Arthrodesis status: Secondary | ICD-10-CM | POA: Insufficient documentation

## 2020-10-14 NOTE — Progress Notes (Signed)
Pamela Sanders - 55 y.o. female MRN [id number]  Date of birth: 1965-08-24  SUBJECTIVE:  Including CC & ROS.  No chief complaint on file.   Pamela Sanders is a 55 y.o. female that is presenting for third shockwave therapy.  She continues to have neck pain following fusion of the cervical spine.  She has tried physical therapy and medications with limited improvement.   Review of Systems See HPI   HISTORY: Past Medical, Surgical, Social, and Family History Reviewed & Updated per EMR.   Pertinent Historical Findings include:  Past Medical History:  Diagnosis Date  . Allergic rhinitis   . Allergic rhinitis 03/07/2008   Qualifier: Diagnosis of  By: Redmond Pulling MD, Karren Cobble of this note might be different from the original. Overview:  Qualifier: Diagnosis of  By: Redmond Pulling MD, Frann Rider  . Chest pain 12/31/2019  . Chest tightness 01/29/2020  . Chronic inflammatory arthritis 03/07/2008   Qualifier: Diagnosis of  By: Redmond Pulling MD, Karren Cobble of this note might be different from the original. Overview:  Qualifier: Diagnosis of  By: Redmond Pulling MD, Frann Rider  . Depression   . Depression (emotion)   . DIVERTICULITIS, HX OF 03/07/2008   Qualifier: Diagnosis of  By: Redmond Pulling MD, Frann Rider    . DJD (degenerative joint disease)    chronic neck pain  . General medical examination 12/03/2010  . GERD 03/07/2008   Qualifier: Diagnosis of  By: Redmond Pulling MD, Karren Cobble of this note might be different from the original. Overview:  Qualifier: Diagnosis of  By: Redmond Pulling MD, Frann Rider  . GERD (gastroesophageal reflux disease)   . Hip pain   . History of diverticulitis of colon   . Hx of diverticulitis of colon   . Insomnia   . Lateral epicondylitis of right elbow   . Leukocytopenia   . Migraine   . Migraine without aura 03/07/2008   Qualifier: Diagnosis of  By: Redmond Pulling MD, Frann Rider    . Neck pain, chronic   . NECK PAIN, CHRONIC 03/07/2008   Qualifier: Diagnosis of  By: Redmond Pulling MD, Frann Rider    .  Osteoarthritis   . Pain of left thumb   . Pain of right sternoclavicular joint   . Phantosmia   . Routine general medical examination at a health care facility 08/09/2013  . Sinusitis   . Sternoclavicular joint pain, right 10/15/2019  . TMJ (dislocation of temporomandibular joint)   . Tremor of both hands 03/08/2020    Past Surgical History:  Procedure Laterality Date  . APPENDECTOMY    . CERVICAL DISCECTOMY     Dr. Harl [facility] 06/[facility]  . HEMORROIDECTOMY    . OVARIAN CYST SURGERY     abdominal surgery for ovarian cyst  . TISSUE GRAFT  02/07/15   pt reports gum graft for receeding gums--Dr Geralynn Ochs  . TMJ ARTHROSCOPY     TMJ surgery    Family History  Problem Relation Age of Onset  . Arthritis Other   . Breast cancer Other   . Coronary artery disease Maternal Grandmother   . Breast cancer Maternal Grandmother        carcinoid tumor in abd  . Hypertension Other   . Diverticulitis Mother   . Arthritis Mother   . Pancreatic cancer Other     Social History   Socioeconomic History  . Marital status: Married    Spouse name: [name]  . Number of children: 0  . Years of education: Not on  file  . Highest education level: Doctorate  Occupational History  . Occupation: Probation officer: [employer]  Tobacco Use  . Smoking status: Never Smoker  . Smokeless tobacco: Never Used  Vaping Use  . Vaping Use: Never used  Substance and Sexual Activity  . Alcohol use: Yes    Alcohol/week: 0.0 standard drinks    Comment: 2 drinks weekly, wine  . Drug use: No  . Sexual activity: Not on file  Other Topics Concern  . 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 She lives with her wife in a 2 story  home. She drinks 1-2 cups of coffee a day and 1-2 glasses of tea. She exercises regularly.   Social Determinants of Health   Financial Resource Strain: Not on file  Food Insecurity: Not on file  Transportation Needs: Not on file  Physical Activity: Not on file  Stress: Not on file  Social Connections: Not on file  Intimate Partner Violence: Not on file     PHYSICAL EXAM:  VS: Ht 5\' 7"  (1.702 m)   Wt 162 lb (73.5 kg)   BMI 25.37 kg/m  Physical Exam Gen: NAD, alert, cooperative with exam, well-appearing   ECSWT Note Pamela Sanders 1966-01-02  Procedure: ECSWT Indications: Right ischial bursitis  Procedure Details Consent: Risks of procedure as well as the alternatives and risks of each were explained to the (patient/caregiver).  Consent for procedure obtained. Time Out: Verified patient identification, verified procedure, site/side was marked, verified correct patient position, special equipment/implants available, medications/allergies/relevent history reviewed, required imaging and test results available.  Performed.  The area was cleaned with iodine and alcohol swabs.    The right ischial bursa was targeted for Extracorporeal shockwave therapy.   Preset: Trochanteric bursitis Power Level: 80 Frequency: 10 Impulse/cycles: 2500 Head size: Large   Patient did tolerate procedure well.     ASSESSMENT & PLAN:   Ischial bursitis of right side Completed her third shockwave therapy. -Follow-up in 1 week.  Status post cervical spinal fusion Continues to have neck pain following her fusion.  Has tried medications and physical therapy. -Counseled on home exercise therapy and supportive care. - The patient will benefit from the at home use of a Zynex NexWave with the clinically proven, TENS, IFC and NMES modalities to reduce pain, improve function and reduce medication use.  Continued use of the above drug-free treatment options are both reasonable and medically necessary at  this time.

## 2020-10-14 NOTE — Patient Instructions (Signed)
Good to see you  Please send me a message in MyChart with any questions or updates.  Please see me back in 1.   --Dr. Raeford Razor

## 2020-10-14 NOTE — Assessment & Plan Note (Signed)
Continues to have neck pain following her fusion.  Has tried medications and physical therapy. -Counseled on home exercise therapy and supportive care. - The patient will benefit from the at home use of a Zynex NexWave with the clinically proven, TENS, IFC and NMES modalities to reduce pain, improve function and reduce medication use.  Continued use of the above drug-free treatment options are both reasonable and medically necessary at this time.

## 2020-10-14 NOTE — Assessment & Plan Note (Signed)
Completed her third shockwave therapy. -Follow-up in 1 week.

## 2020-10-19 ENCOUNTER — Encounter: Payer: Self-pay | Admitting: Family

## 2020-10-21 ENCOUNTER — Ambulatory Visit: Payer: Self-pay | Admitting: Family Medicine

## 2020-10-21 ENCOUNTER — Encounter: Payer: Self-pay | Admitting: Family Medicine

## 2020-10-21 ENCOUNTER — Other Ambulatory Visit: Payer: Self-pay

## 2020-10-21 DIAGNOSIS — M7071 Other bursitis of hip, right hip: Secondary | ICD-10-CM

## 2020-10-21 NOTE — Progress Notes (Signed)
Pamela Sanders - 55 y.o. female MRN [id number]  Date of birth: 04-12-1966  SUBJECTIVE:  Including CC & ROS.  No chief complaint on file.   Pamela Sanders is a 55 y.o. female that is presenting for 4th ECSWT.    Review of Systems See HPI   HISTORY: Past Medical, Surgical, Social, and Family History Reviewed & Updated per EMR.   Pertinent Historical Findings include:  Past Medical History:  Diagnosis Date  . Allergic rhinitis   . Allergic rhinitis 03/07/2008   Qualifier: Diagnosis of  By: Redmond Pulling MD, Karren Cobble of this note might be different from the original. Overview:  Qualifier: Diagnosis of  By: Redmond Pulling MD, Frann Rider  . Chest pain 12/31/2019  . Chest tightness 01/29/2020  . Chronic inflammatory arthritis 03/07/2008   Qualifier: Diagnosis of  By: Redmond Pulling MD, Karren Cobble of this note might be different from the original. Overview:  Qualifier: Diagnosis of  By: Redmond Pulling MD, Frann Rider  . Depression   . Depression (emotion)   . DIVERTICULITIS, HX OF 03/07/2008   Qualifier: Diagnosis of  By: Redmond Pulling MD, Frann Rider    . DJD (degenerative joint disease)    chronic neck pain  . General medical examination 12/03/2010  . GERD 03/07/2008   Qualifier: Diagnosis of  By: Redmond Pulling MD, Karren Cobble of this note might be different from the original. Overview:  Qualifier: Diagnosis of  By: Redmond Pulling MD, Frann Rider  . GERD (gastroesophageal reflux disease)   . Hip pain   . History of diverticulitis of colon   . Hx of diverticulitis of colon   . Insomnia   . Lateral epicondylitis of right elbow   . Leukocytopenia   . Migraine   . Migraine without aura 03/07/2008   Qualifier: Diagnosis of  By: Redmond Pulling MD, Frann Rider    . Neck pain, chronic   . NECK PAIN, CHRONIC 03/07/2008   Qualifier: Diagnosis of  By: Redmond Pulling MD, Frann Rider    . Osteoarthritis   . Pain of left thumb   . Pain of right sternoclavicular joint   . Phantosmia   . Routine general medical examination at a health care facility  08/09/2013  . Sinusitis   . Sternoclavicular joint pain, right 10/15/2019  . TMJ (dislocation of temporomandibular joint)   . Tremor of both hands 03/08/2020    Past Surgical History:  Procedure Laterality Date  . APPENDECTOMY    . CERVICAL DISCECTOMY     Dr. Harl Bowie 06/2011[facility]  . HEMORROIDECTOMY    . OVARIAN CYST SURGERY     abdominal surgery for ovarian cyst  . TISSUE GRAFT  02/07/15   pt reports gum graft for receeding gums--Dr Geralynn Ochs  . TMJ ARTHROSCOPY     TMJ surgery    Family History  Problem Relation Age of Onset  . Arthritis Other   . Breast cancer Other   . Coronary artery disease Maternal Grandmother   . Breast cancer Maternal Grandmother        carcinoid tumor in abd  . Hypertension Other   . Diverticulitis Mother   . Arthritis Mother   . Pancreatic cancer Other     Social History   Socioeconomic History  . Marital status: Married    Spouse name: [name]  . Number of children: 0  . Years of education: Not on file  . Highest education level: Doctorate  Occupational History  . Occupation: Probation officer: [employer]  Tobacco Use  . Smoking status: Never Smoker  . Smokeless tobacco: Never Used  Vaping Use  . Vaping Use: Never used  Substance and Sexual Activity  . Alcohol use: Yes    Alcohol/week: 0.0 standard drinks    Comment: 2 drinks weekly, wine  . Drug use: No  . Sexual activity: Not on file  Other Topics Concern  . 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 She lives with her wife in a 2 story home. She drinks 1-2 cups of coffee a day and 1-2 glasses of tea. She exercises regularly.   Social Determinants of Health   Financial Resource Strain: Not on file   Food Insecurity: Not on file  Transportation Needs: Not on file  Physical Activity: Not on file  Stress: Not on file  Social Connections: Not on file  Intimate Partner Violence: Not on file     PHYSICAL EXAM:  VS: Ht 5\' 7"  (1.702 m)   Wt 162 lb (73.5 kg)   BMI 25.37 kg/m  Physical Exam Gen: NAD, alert, cooperative with exam, well-appearing   ECSWT Note Pamela Sanders Jun 04, 1966  Procedure: ECSWT Indications: right ischial bursitis   Procedure Details Consent: Risks of procedure as well as the alternatives and risks of each were explained to the (patient/caregiver).  Consent for procedure obtained. Time Out: Verified patient identification, verified procedure, site/side was marked, verified correct patient position, special equipment/implants available, medications/allergies/relevent history reviewed, required imaging and test results available.  Performed.  The area was cleaned with iodine and alcohol swabs.    The right ischial bursa was targeted for Extracorporeal shockwave therapy.   Preset: Trochanter bursitis  Power Level: 90 Frequency: 10 Impulse/cycles: 2800 Head size: large  Session: 4th  Patient did tolerate procedure well.    ASSESSMENT & PLAN:   Ischial bursitis of right side Completed 4th ECSWT today - f/u in 4 weeks.

## 2020-10-21 NOTE — Assessment & Plan Note (Signed)
Completed 4th ECSWT today - f/u in 4 weeks.

## 2020-10-28 ENCOUNTER — Ambulatory Visit: Payer: PRIVATE HEALTH INSURANCE | Admitting: Family Medicine

## 2020-11-12 NOTE — Progress Notes (Signed)
NEUROLOGY FOLLOW UP OFFICE NOTE  Pamela Sanders [id number]  Assessment/Plan:   Benign essential tremor - as it is affecting her work, we will treat. 1.  Start primidone 25mg  at bedtime for one week, then 50mg  at bedtime.  Contact me in 4 weeks with update and we can increase dose if needed. 2.  Follow up 6 months.  Subjective:  Pamela Sanders is a 55 year old right-handed female with degenerative joint disease with chronic neck pain s/p ACDF C5-C6 (2011) whom I previously saw for neck pain with radiculopathy presents today for tremors.  UPDATE: Last seen in October 2020.  Due to ongoing right arm pain, I ordered an MRI of the cervical spine performed on 04/07/2019, which was personally reviewed, and reveald prior C5-C6 ACDF without residudal foraminal or canal stenosis as well as mild canal stenosis and moderate right foraminal stenosis at C6-7 and mild left foraminal stenosis at C3-4.  Since that visit, she has continued to have chronic neck with radicular pain, getting worse.  Repeat MRI of cervical spine on 04/14/2020 personally reviewed showed spurring causing mild foraminal narrowing bilaterally at C3-4 and C6-7.  She saw orthopedics who believed findings could be contributing to her pain but did not appear significant enough to warrant surgery and recommended conservative treatment.  She is treated by Pain Management and has received cervical facet blocks at C4-5 and C5-6 bilaterally which have helped..  She has been treated by Sports Medicine.  Failed prednisone.  Tried diclofenac, Zanaflex, cervical collar and heat.  Continues to take gabapentin, which helps.  However, she presents today to discuss tremor.  She has noticed it for about a year.  It is bilateral, but worse on the right.  It occurs when hands are with use or outstretched.  It does not occur at rest.  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  Her hands will shake.  No trouble swallowing.  No trouble with walking.  No history of REM sleep behavior disorder.  She does occasionally drink wine but has not noticed if symptoms subside when drinking.  She reports that her great grandmother had Parkinson's disease but no known family history of essential tremor.  Works in a lab papet tremors fluctuates right worse than left - over a year -   [place] grandmother had PD.  No fh of tremors   Current NSAIDS:Ibuprofen (for tension-type headache) Current analgesics:Tylenol (for tension-type headache) Current triptans:no Current ergotamine:no Current anti-emetic:no Current muscle relaxants:no Current sleep aide:Ambien Current Antihypertensive medications:no Current Antidepressant medications:Wellbutrin XL150mg  daily Current Anticonvulsant medications:Gabapentin 600mg  twice daily Current anti-CGRP:no Current Vitamins/Herbal/Supplements:no Current Antihistamines/Decongestants:Benadryl Other therapy:no   HISTORY: She has chronic neck pain with degenerative disease of the cervical spine s/p ACDF C5-C6 in 2011. In October, she endorses that when she wakes up, her right arm is numb down to the last 3 fingers. If she tries to shake it out, it takes 30 minutes to resolve. Around January, she developed acute right elbow pain over the lateral epicondyle. She denied radicular symptoms at the time. She was diagnosed with epicondylitis. She received a shot in the elbow which was not effective. She continued to experience burning pain on outer portion of right arm from elbow down to wrist. She also endorsed intermittent numbness in last 2 digits of right hand, especially waking up in the morning, as well as burning on  inner portion of left elbow. She briefly had some numbness in fingers of left hand as well.   NCV-EMG of bilateral upper extremities from 03/07/2019 showed findings consistent with mild  carpal tunnel syndrome but no evidence of ulnar neuropathy or cervical radiculopathy.  Migraines: [id number] Location:Left or right frontal/retro-orbital, chronic left sided neck pain. She has constant pain in left jaw (history of jaw surgery for TMJ. She uses a bite guard for bruxism. Quality:Stabbing/throbbing Intensity:Moderate to severe.Shedenies new headache, thunderclap headache or severe headache that wakes herfrom sleep. Aura:Sometimes "bluish vision". One time had tunnel vision in 2005. Prodrome:no Postdrome:no Associated symptoms:Photophobia, phonophobia.Sometimes tinnitus. Shedenies associated nausea, vomiting, visual disturbance, autonomic symptoms orunilateral numbness or weakness. Duration:1/2 to 1 day Frequency:Once every 5 to 6 months Frequency of abortive medication:2 days a week Triggers/exacerbating factors:Emotional stress, skipped meals, sleep deprivation Relieving factors:Laying down in dark and quiet room Activity:Avoids  She also has tension-type headaches in the back of her head radiating into the neck as well as headaches at the bridge of her nose.They last a day. They occur 2 days a week.   MRI of brain with and without contrast (01/10/13): "1. No acute intracranial abnormality. 2. Developmental venous anomaly within the posterior right frontal lobe."  XR Cervical Spine (04/21/17): "1. C5-C6 ACDF with anterior plate and screw fixation and complete graft incorporation. No motion with flexion/extension to suggest pseudoarthrosis. 2. Mild C4-C5 and C6-C7 degenerative disc disease. Mild bilateral C7-T1 facet arthropathy. 3. No acute fracture or prevertebral swelling. 4. Multiple mandibular screws bilaterally."  Past NSAIDS:etodolac Past analgesics:no Past abortive triptans:Sumatriptan (causes nausea) Past abortive ergotamine:no Past muscle relaxants:Tizanidine 4mg  (helped) Past  anti-emetic:Compazine (used for sumatriptan-induced nausea) Past antihypertensive medications:no Past antidepressant medications:Nortriptyline (did not tolerate) Past anticonvulsant medications:Topiramate(disoriented), Keppra Past anti-CGRP:no Past vitamins/Herbal/Supplements:no Past antihistamines/decongestants:Phenylephrine (for tension-type headache),Claritin Other past therapies:Botox (effective but causedwelts and itching), massage, caffeine  PAST MEDICAL HISTORY: Past Medical History:  Diagnosis Date  . Allergic rhinitis   . Allergic rhinitis 03/07/2008   Qualifier: Diagnosis of  By: Redmond Pulling MD, Karren Cobble of this note might be different from the original. Overview:  Qualifier: Diagnosis of  By: Redmond Pulling MD, Frann Rider  . Chest pain 12/31/2019  . Chest tightness 01/29/2020  . Chronic inflammatory arthritis 03/07/2008   Qualifier: Diagnosis of  By: Redmond Pulling MD, Karren Cobble of this note might be different from the original. Overview:  Qualifier: Diagnosis of  By: Redmond Pulling MD, Frann Rider  . Depression   . Depression (emotion)   . DIVERTICULITIS, HX OF 03/07/2008   Qualifier: Diagnosis of  By: Redmond Pulling MD, Frann Rider    . DJD (degenerative joint disease)    chronic neck pain  . General medical examination 12/03/2010  . GERD 03/07/2008   Qualifier: Diagnosis of  By: Redmond Pulling MD, Karren Cobble of this note might be different from the original. Overview:  Qualifier: Diagnosis of  By: Redmond Pulling MD, Frann Rider  . GERD (gastroesophageal reflux disease)   . Hip pain   . History of diverticulitis of colon   . Hx of diverticulitis of colon   . Insomnia   . Lateral epicondylitis of right elbow   . Leukocytopenia   . Migraine   . Migraine without aura 03/07/2008   Qualifier: Diagnosis of  By: Redmond Pulling MD, Frann Rider    . Neck pain, chronic   . NECK PAIN, CHRONIC 03/07/2008   Qualifier: Diagnosis of  By: Redmond Pulling MD, Frann Rider    . Osteoarthritis   . Pain  of left thumb    . Pain of right sternoclavicular joint   . Phantosmia   . Routine general medical examination at a health care facility 08/09/2013  . Sinusitis   . Sternoclavicular joint pain, right 10/15/2019  . TMJ (dislocation of temporomandibular joint)   . Tremor of both hands 03/08/2020    MEDICATIONS: Current Outpatient Medications on File Prior to Visit  Medication Sig Dispense Refill  . augmented betamethasone dipropionate (DIPROLENE-AF) 0.05 % ointment Apply 1 application topically as needed (eczema).    Marland Kitchen b complex vitamins capsule Take 1 capsule by mouth daily.     Marland Kitchen buPROPion (WELLBUTRIN XL) 150 MG 24 hr tablet Take 2 tablets (300 mg total) by mouth daily. 60 tablet 0  . calcium citrate-vitamin D (CITRACAL+D) 315-200 MG-UNIT per tablet Take 2 tablets by mouth daily.    Marland Kitchen CAPSAICIN EX Apply 1 application topically as needed (muscle pain).    Marland Kitchen diphenhydrAMINE (BENADRYL) 25 MG tablet Take 25 mg by mouth daily as needed for itching or allergies.     . famotidine (PEPCID AC) 10 MG chewable tablet Chew 10 mg by mouth 2 (two) times daily.    Marland Kitchen gabapentin (NEURONTIN) 300 MG capsule TAKE 2 CAPSULES 2 TIMES A DAY 360 capsule 1  . loratadine (CLARITIN) 10 MG tablet Take 10 mg by mouth daily.    . Melatonin 5 MG CAPS Take 5 mg by mouth at bedtime as needed (sleep).     . Multiple Vitamins-Minerals (ONE-A-DAY WOMENS 50 PLUS) TABS Take 1 tablet by mouth daily.    . norethindrone (AYGESTIN) 5 MG tablet Take 10 mg by mouth every 7 (seven) days. A month    . Psyllium (METAMUCIL PO) Take 0.5 fluid ounces by mouth daily.    Marland Kitchen zolpidem (AMBIEN) 5 MG tablet Take 1 tablet (5 mg total) by mouth at bedtime as needed. for sleep 30 tablet 2   No current facility-administered medications on file prior to visit.    ALLERGIES: Allergies  Allergen Reactions  . Celebrex [Celecoxib]     Headaches  . Penicillins Hives  . Shingrix [Zoster Vac Recomb Adjuvanted]     Fever, myalgia, local redness    FAMILY  HISTORY: Family History  Problem Relation Age of Onset  . Arthritis Other   . Breast cancer Other   . Coronary artery disease Maternal Grandmother   . Breast cancer Maternal Grandmother        carcinoid tumor in abd  . Hypertension Other   . Diverticulitis Mother   . Arthritis Mother   . Pancreatic cancer Other       Objective:  Blood pressure 130/88, pulse 93, height 5\' 7"  (1.702 m), weight 169 lb 9.6 oz (76.9 kg), SpO2 99 %. General: No acute distress.  Patient appears well-groomed.   Head:  Normocephalic/atraumatic Eyes:  Fundi examined but not visualized Neck: supple, no paraspinal tenderness, full range of motion Heart:  Regular rate and rhythm Lungs:  Clear to auscultation bilaterally Back: No paraspinal tenderness Neurological Exam: alert and oriented to person, place, and time. Attention span and concentration intact, recent and remote memory intact, fund of knowledge intact.  Speech fluent and not dysarthric, language intact.  CN II-XII intact. Bulk and tone normal, muscle strength 5/5 throughout.  No bradykinesia.  Postural and slight kinetic fine tremor in hands.  No resting tremor.  Sensation to light touch intact.  Deep tendon reflexes 2+ throughout, toes downgoing.  Finger to nose and heel to shin testing  intact.  Gait normal, Romberg negative.     Metta Clines, DO  CC:  [name], NP

## 2020-11-13 ENCOUNTER — Other Ambulatory Visit: Payer: Self-pay

## 2020-11-13 ENCOUNTER — Ambulatory Visit: Payer: PRIVATE HEALTH INSURANCE | Admitting: Neurology

## 2020-11-13 ENCOUNTER — Encounter: Payer: Self-pay | Admitting: Neurology

## 2020-11-13 VITALS — BP 130/88 | HR 93 | Ht 67.0 in | Wt 169.6 lb

## 2020-11-13 DIAGNOSIS — G250 Essential tremor: Secondary | ICD-10-CM

## 2020-11-13 MED ORDER — PRIMIDONE 50 MG PO TABS: ORAL_TABLET | ORAL | 0 refills | Status: DC

## 2020-11-13 NOTE — Patient Instructions (Signed)
1.  Start primidone 50mg  - take 1/2 tablet at bedtime for one week, then increase to 1 tablet at bedtime.  Contact me with update and refill and we can increase dose if needed. 2.  Follow up 6 months   Essential Tremor A tremor is trembling or shaking that a person cannot control. Most tremors affect the hands or arms. Tremors can also affect the head, vocal cords, legs, and other parts of the body. Essential tremor is a tremor without a known cause. Usually, it occurs while a person is trying to perform an action. It tends to get worse gradually as a person ages. What are the causes? The cause of this condition is not known. What increases the risk? You are more likely to develop this condition if:  You have a family member with essential tremor.  You are age 56 or older.  You take certain medicines. What are the signs or symptoms? The main sign of a tremor is a rhythmic shaking of certain parts of your body that is uncontrolled and unintentional. You may:  Have difficulty eating with a spoon or fork.  Have difficulty writing.  Nod your head up and down or side to side.  Have a quivering voice. The shaking may:  Get worse over time.  Come and go.  Be more noticeable on one side of your body.  Get worse due to stress, fatigue, caffeine, and extreme heat or cold. How is this diagnosed? This condition may be diagnosed based on:  Your symptoms and medical history.  A physical exam. There is no single test to diagnose an essential tremor. However, your health care provider may order tests to rule out other causes of your condition. These may include:  Blood and urine tests.  Imaging studies of your brain, such as CT scan and MRI.  A test that measures involuntary muscle movement (electromyogram).   How is this treated? Treatment for essential tremor depends on the severity of the condition.  Some tremors may go away without treatment.  Mild tremors may not need  treatment if they do not affect your day-to-day life.  Severe tremors may need to be treated using one or more of the following options: ? Medicines. ? Lifestyle changes. ? Occupational or physical therapy. Follow these instructions at home: Lifestyle  Do not use any products that contain nicotine or tobacco, such as cigarettes and e-cigarettes. If you need help quitting, ask your health care provider.  Limit your caffeine intake as told by your health care provider.  Try to get 8 hours of sleep each night.  Find ways to manage your stress that fits your lifestyle and personality. Consider trying meditation or yoga.  Try to anticipate stressful situations and allow extra time to manage them.  If you are struggling emotionally with the effects of your tremor, consider working with a mental health provider.   General instructions  Take over-the-counter and prescription medicines only as told by your health care provider.  Avoid extreme heat and extreme cold.  Keep all follow-up visits as told by your health care provider. This is important. Visits may include physical therapy visits. Contact a health care provider if:  You experience any changes in the location or intensity of your tremors.  You start having a tremor after starting a new medicine.  You have tremor with other symptoms, such as: ? Numbness. ? Tingling. ? Pain. ? Weakness.  Your tremor gets worse.  Your tremor interferes with your daily  life.  You feel down, blue, or sad for at least 2 weeks in a row.  Worrying about your tremor and what other people think about you interferes with your everyday life functions, including relationships, work, or school. Summary  Essential tremor is a tremor without a known cause. Usually, it occurs when you are trying to perform an action.  You are more likely to develop this condition if you have a family member with essential tremor.  The main sign of a tremor is a  rhythmic shaking of certain parts of your body that is uncontrolled and unintentional.  Treatment for essential tremor depends on the severity of the condition. This information is not intended to replace advice given to you by your health care provider. Make sure you discuss any questions you have with your health care provider. Document Revised: 02/16/2020 Document Reviewed: 02/16/2020 Elsevier Patient Education  2021 [employer].

## 2020-11-14 ENCOUNTER — Other Ambulatory Visit: Payer: Self-pay

## 2020-11-18 ENCOUNTER — Other Ambulatory Visit: Payer: Self-pay

## 2020-11-18 ENCOUNTER — Ambulatory Visit: Payer: Self-pay

## 2020-11-18 ENCOUNTER — Ambulatory Visit: Payer: Self-pay | Admitting: Family Medicine

## 2020-11-18 ENCOUNTER — Encounter: Payer: Self-pay | Admitting: Family Medicine

## 2020-11-18 VITALS — Ht 67.0 in | Wt 169.0 lb

## 2020-11-18 DIAGNOSIS — M7712 Lateral epicondylitis, left elbow: Secondary | ICD-10-CM | POA: Insufficient documentation

## 2020-11-18 MED ORDER — METHYLPREDNISOLONE ACETATE 40 MG/ML IJ SUSP
40.0000 mg | Freq: Once | INTRAMUSCULAR | Status: AC
Start: 2020-11-18 — End: 2020-11-18
  Administered 2020-11-18: 40 mg via INTRA_ARTICULAR

## 2020-11-18 NOTE — Progress Notes (Signed)
Pamela Sanders - 55 y.o. female MRN [id number]  Date of birth: 1966/05/17  SUBJECTIVE:  Including CC & ROS.  No chief complaint on file.   Pamela Sanders is a 55 y.o. female that is presenting with acute left elbow pain.  Seems to be getting worse over the past month.  Occurring over the lateral aspect.   Review of Systems See HPI   HISTORY: Past Medical, Surgical, Social, and Family History Reviewed & Updated per EMR.   Pertinent Historical Findings include:  Past Medical History:  Diagnosis Date   Allergic rhinitis    Allergic rhinitis 03/07/2008   Qualifier: Diagnosis of  By: Redmond Pulling MD, Karren Cobble of this note might be different from the original. Overview:  Qualifier: Diagnosis of  By: Redmond Pulling MD, LauraLee   Chest pain 12/31/2019   Chest tightness 01/29/2020   Chronic inflammatory arthritis 03/07/2008   Qualifier: Diagnosis of  By: Redmond Pulling MD, Karren Cobble of this note might be different from the original. Overview:  Qualifier: Diagnosis of  By: Redmond Pulling MD, LauraLee   Depression    Depression (emotion)    DIVERTICULITIS, HX OF 03/07/2008   Qualifier: Diagnosis of  By: Redmond Pulling MD, LauraLee     DJD (degenerative joint disease)    chronic neck pain   General medical examination 12/03/2010   GERD 03/07/2008   Qualifier: Diagnosis of  By: Redmond Pulling MD, Karren Cobble of this note might be different from the original. Overview:  Qualifier: Diagnosis of  By: Redmond Pulling MD, Frann Rider   GERD (gastroesophageal reflux disease)    Hip pain    History of diverticulitis of colon    Hx of diverticulitis of colon    Insomnia    Lateral epicondylitis of right elbow    Leukocytopenia    Migraine    Migraine without aura 03/07/2008   Qualifier: Diagnosis of  By: Redmond Pulling MD, LauraLee     Neck pain, chronic    NECK PAIN, CHRONIC 03/07/2008   Qualifier: Diagnosis of  By: Redmond Pulling MD, LauraLee     Osteoarthritis    Pain of left thumb    Pain of right sternoclavicular joint     Phantosmia    Routine general medical examination at a health care facility 08/09/2013   Sinusitis    Sternoclavicular joint pain, right 10/15/2019   TMJ (dislocation of temporomandibular joint)    Tremor of both hands 03/08/2020    Past Surgical History:  Procedure Laterality Date   APPENDECTOMY     CERVICAL DISCECTOMY     Dr. Harl Bowie 11/2009- [facility]   HEMORROIDECTOMY     OVARIAN CYST SURGERY     abdominal surgery for ovarian cyst   TISSUE GRAFT  02/07/15   pt reports gum graft for receeding gums--Dr Geralynn Ochs   TMJ ARTHROSCOPY     TMJ surgery    Family History  Problem Relation Age of Onset   Arthritis Other    Breast cancer Other    Coronary artery disease Maternal Grandmother    Breast cancer Maternal Grandmother        carcinoid tumor in abd   Hypertension Other    Diverticulitis Mother    Arthritis Mother    Pancreatic cancer Other     Social History   Socioeconomic History   Marital status: Married    Spouse name: [name]   Number of children: 0   Years of education: Not on file   Highest education level: Doctorate  Occupational History   Occupation: Probation officer: [employer]  Tobacco Use   Smoking status: Never   Smokeless tobacco: Never  Vaping Use   Vaping Use: Never used  Substance and Sexual Activity   Alcohol use: Yes    Alcohol/week: 0.0 standard drinks    Comment: 2 drinks weekly, wine   Drug use: No   Sexual activity: Not on file  Other Topics Concern   Not on file  Social History Narrative   Domestic Partner   no children   She grew up in [place], [place]    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 She lives with her wife in a 2 story home. She drinks 1-2 cups of coffee a day and 1-2 glasses of tea. She exercises regularly.   Social Determinants of Health    Financial Resource Strain: Not on file  Food Insecurity: Not on file  Transportation Needs: Not on file  Physical Activity: Not on file  Stress: Not on file  Social Connections: Not on file  Intimate Partner Violence: Not on file     PHYSICAL EXAM:  VS: Ht 5\' 7"  (1.702 m)   Wt 169 lb (76.7 kg)   BMI 26.47 kg/m  Physical Exam Gen: NAD, alert, cooperative with exam, well-appearing MSK:  Left elbow: Tenderness to palpation at the epicondyles. Normal range of motion. No swelling or ecchymosis. Neurovascular intact  Limited ultrasound: Left elbow:  No significant hyperemia at the lateral condyle. No joint effusion.  Summary: No significant structural changes.  Ultrasound and interpretation by Clearance Coots, MD   Aspiration/Injection Procedure Note Elizzie Westergard [birth year]  Procedure: Injection Indications: Left elbow pain  Procedure Details Consent: Risks of procedure as well as the alternatives and risks of each were explained to the (patient/caregiver).  Consent for procedure obtained. Time Out: Verified patient identification, verified procedure, site/side was marked, verified correct patient position, special equipment/implants available, medications/allergies/relevent history reviewed, required imaging and test results available.  Performed.  The area was cleaned with iodine and alcohol swabs.    The left lateral epicondyle was injected using 1 cc's of 40 mg Depo-Medrol and 2 cc's of 0.25% bupivacaine with a 25 1 1/2" needle.  Ultrasound was used. Images were obtained in long views showing the injection.     A sterile dressing was applied.  Patient did tolerate procedure well.   ASSESSMENT & PLAN:   Lateral epicondylitis of left elbow Clinical exam is most consistent with epicondylitis. -Counseled on home exercise therapy and supportive therapy -Injection today. -Could consider shockwave therapy or physical therapy.

## 2020-11-18 NOTE — Assessment & Plan Note (Signed)
Clinical exam is most consistent with epicondylitis. -Counseled on home exercise therapy and supportive therapy -Injection today. -Could consider shockwave therapy or physical therapy.

## 2020-11-18 NOTE — Patient Instructions (Signed)
Good to see you  Please try the exercises  Please try ice as needed  Please send me a message in MyChart with any questions or updates.  Please see me  --Dr. Raeford Razor

## 2020-11-19 ENCOUNTER — Telehealth: Payer: Self-pay | Admitting: Family Medicine

## 2020-11-19 NOTE — Telephone Encounter (Signed)
Talked to patient.   Rosemarie Ax, MD [facility] 11/19/2020, 9:26 AM

## 2020-11-19 NOTE — Telephone Encounter (Signed)
Forwarding message to provider patient left msg this Morning @ 6:01am still experiencing some pain (possibly from injection) req'd a call back w/ recommendations  --glh

## 2020-11-28 ENCOUNTER — Ambulatory Visit: Payer: PRIVATE HEALTH INSURANCE | Admitting: Neurology

## 2020-12-17 NOTE — Telephone Encounter (Signed)
error 

## 2020-12-25 ENCOUNTER — Other Ambulatory Visit: Payer: Self-pay

## 2020-12-25 MED ORDER — PRIMIDONE 50 MG PO TABS: ORAL_TABLET | ORAL | 0 refills | Status: DC

## 2020-12-25 NOTE — Telephone Encounter (Signed)
Per Dr.Jaffe pt to restart Primiodone 50 mg at bedtime.

## 2020-12-31 ENCOUNTER — Encounter: Payer: Self-pay | Admitting: Family

## 2021-01-02 ENCOUNTER — Ambulatory Visit: Payer: No Typology Code available for payment source | Admitting: Family Medicine

## 2021-01-02 ENCOUNTER — Other Ambulatory Visit: Payer: Self-pay

## 2021-01-02 VITALS — BP 114/68 | HR 62 | Temp 98.2°F | Resp 16 | Wt 172.4 lb

## 2021-01-02 DIAGNOSIS — K219 Gastro-esophageal reflux disease without esophagitis: Secondary | ICD-10-CM

## 2021-01-02 DIAGNOSIS — J309 Allergic rhinitis, unspecified: Secondary | ICD-10-CM

## 2021-01-02 DIAGNOSIS — J329 Chronic sinusitis, unspecified: Secondary | ICD-10-CM

## 2021-01-02 DIAGNOSIS — D72819 Decreased white blood cell count, unspecified: Secondary | ICD-10-CM

## 2021-01-02 DIAGNOSIS — H938X9 Other specified disorders of ear, unspecified ear: Secondary | ICD-10-CM | POA: Diagnosis not present

## 2021-01-02 DIAGNOSIS — R5383 Other fatigue: Secondary | ICD-10-CM | POA: Diagnosis not present

## 2021-01-02 LAB — CBC WITH DIFFERENTIAL/PLATELET
Basophils Absolute: 0 10*3/uL (ref 0.0–0.1)
Basophils Relative: 0.9 % (ref 0.0–3.0)
Eosinophils Absolute: 0.1 10*3/uL (ref 0.0–0.7)
Eosinophils Relative: 1.5 % (ref 0.0–5.0)
HCT: 41.2 % (ref 36.0–46.0)
Hemoglobin: 13.2 g/dL (ref 12.0–15.0)
Lymphocytes Relative: 27.2 % (ref 12.0–46.0)
Lymphs Abs: 1.3 10*3/uL (ref 0.7–4.0)
MCHC: 31.9 g/dL (ref 30.0–36.0)
MCV: 88.6 fl (ref 78.0–100.0)
Monocytes Absolute: 0.6 10*3/uL (ref 0.1–1.0)
Monocytes Relative: 13.5 % — ABNORMAL HIGH (ref 3.0–12.0)
Neutro Abs: 2.7 10*3/uL (ref 1.4–7.7)
Neutrophils Relative %: 56.9 % (ref 43.0–77.0)
Platelets: 211 10*3/uL (ref 150.0–400.0)
RBC: 4.65 Mil/uL (ref 3.87–5.11)
RDW: 13.4 % (ref 11.5–15.5)
WBC: 4.8 10*3/uL (ref 4.0–10.5)

## 2021-01-02 LAB — COMPREHENSIVE METABOLIC PANEL
ALT: 21 U/L (ref 0–35)
AST: 22 U/L (ref 0–37)
Albumin: 4.1 g/dL (ref 3.5–5.2)
Alkaline Phosphatase: 73 U/L (ref 39–117)
BUN: 12 mg/dL (ref 6–23)
CO2: 29 mEq/L (ref 19–32)
Calcium: 9 mg/dL (ref 8.4–10.5)
Chloride: 103 mEq/L (ref 96–112)
Creatinine, Ser: 0.93 mg/dL (ref 0.40–1.20)
GFR: 69.08 mL/min (ref >60.00)
Glucose, Bld: 73 mg/dL (ref 70–99)
Potassium: 4.8 mEq/L (ref 3.5–5.1)
Sodium: 139 mEq/L (ref 135–145)
Total Bilirubin: 0.4 mg/dL (ref 0.2–1.2)
Total Protein: 6.5 g/dL (ref 6.0–8.3)

## 2021-01-02 LAB — TSH: TSH: 2.9 u[IU]/mL (ref 0.35–5.50)

## 2021-01-02 MED ORDER — AZITHROMYCIN 250 MG PO TABS: ORAL_TABLET | ORAL | 0 refills | Status: AC

## 2021-01-02 MED ORDER — METHYLPREDNISOLONE 4 MG PO TABS: ORAL_TABLET | ORAL | 0 refills | Status: DC

## 2021-01-02 NOTE — Patient Instructions (Signed)
Plain mucinex 600-800 mg twice daily x 10 days  Sinusitis, Adult Sinusitis is inflammation of your sinuses. Sinuses are hollow spaces in the bones around your face. Your sinuses are located: Around your eyes. In the middle of your forehead. Behind your nose. In your cheekbones. Mucus normally drains out of your sinuses. When your nasal tissues become inflamed or swollen, mucus can become trapped or blocked. This allows bacteria, viruses, and fungi to grow, which leads to infection. Most infections of thesinuses are caused by a virus. Sinusitis can develop quickly. It can last for up to 4 weeks (acute) or for more than 12 weeks (chronic). Sinusitis often develops after a cold. What are the causes? This condition is caused by anything that creates swelling in the sinuses or stops mucus from draining. This includes: Allergies. Asthma. Infection from bacteria or viruses. Deformities or blockages in your nose or sinuses. Abnormal growths in the nose (nasal polyps). Pollutants, such as chemicals or irritants in the air. Infection from fungi (rare). What increases the risk? You are more likely to develop this condition if you: Have a weak body defense system (immune system). Do a lot of swimming or diving. Overuse nasal sprays. Smoke. What are the signs or symptoms? The main symptoms of this condition are pain and a feeling of pressure around the affected sinuses. Other symptoms include: Stuffy nose or congestion. Thick drainage from your nose. Swelling and warmth over the affected sinuses. Headache. Upper toothache. A cough that may get worse at night. Extra mucus that collects in the throat or the back of the nose (postnasal drip). Decreased sense of smell and taste. Fatigue. A fever. Sore throat. Bad breath. How is this diagnosed? This condition is diagnosed based on: Your symptoms. Your medical history. A physical exam. Tests to find out if your condition is acute or chronic.  This may include: Checking your nose for nasal polyps. Viewing your sinuses using a device that has a light (endoscope). Testing for allergies or bacteria. Imaging tests, such as an MRI or CT scan. In rare cases, a bone biopsy may be done to rule out more serious types offungal sinus disease. How is this treated? Treatment for sinusitis depends on the cause and whether your condition is chronic or acute. If caused by a virus, your symptoms should go away on their own within 10 days. You may be given medicines to relieve symptoms. They include: Medicines that shrink swollen nasal passages (topical intranasal decongestants). Medicines that treat allergies (antihistamines). A spray that eases inflammation of the nostrils (topical intranasal corticosteroids). Rinses that help get rid of thick mucus in your nose (nasal saline washes). If caused by bacteria, your health care provider may recommend waiting to see if your symptoms improve. Most bacterial infections will get better without antibiotic medicine. You may be given antibiotics if you have: A severe infection. A weak immune system. If caused by narrow nasal passages or nasal polyps, you may need to have surgery. Follow these instructions at home: Medicines Take, use, or apply over-the-counter and prescription medicines only as told by your health care provider. These may include nasal sprays. If you were prescribed an antibiotic medicine, take it as told by your health care provider. Do not stop taking the antibiotic even if you start to feel better. Hydrate and humidify  Drink enough fluid to keep your urine pale yellow. Staying hydrated will help to thin your mucus. Use a cool mist humidifier to keep the humidity level in your home above  50%. Inhale steam for 10-15 minutes, 3-4 times a day, or as told by your health care provider. You can do this in the bathroom while a hot shower is running. Limit your exposure to cool or dry  air.  Rest Rest as much as possible. Sleep with your head raised (elevated). Make sure you get enough sleep each night. General instructions  Apply a warm, moist washcloth to your face 3-4 times a day or as told by your health care provider. This will help with discomfort. Wash your hands often with soap and water to reduce your exposure to germs. If soap and water are not available, use hand sanitizer. Do not smoke. Avoid being around people who are smoking (secondhand smoke). Keep all follow-up visits as told by your health care provider. This is important.  Contact a health care provider if: You have a fever. Your symptoms get worse. Your symptoms do not improve within 10 days. Get help right away if: You have a severe headache. You have persistent vomiting. You have severe pain or swelling around your face or eyes. You have vision problems. You develop confusion. Your neck is stiff. You have trouble breathing. Summary Sinusitis is soreness and inflammation of your sinuses. Sinuses are hollow spaces in the bones around your face. This condition is caused by nasal tissues that become inflamed or swollen. The swelling traps or blocks the flow of mucus. This allows bacteria, viruses, and fungi to grow, which leads to infection. If you were prescribed an antibiotic medicine, take it as told by your health care provider. Do not stop taking the antibiotic even if you start to feel better. Keep all follow-up visits as told by your health care provider. This is important. This information is not intended to replace advice given to you by your health care provider. Make sure you discuss any questions you have with your healthcare provider. Document Revised: 10/25/2017 Document Reviewed: 10/25/2017 Elsevier Patient Education  2022 [employer].

## 2021-01-05 NOTE — Assessment & Plan Note (Signed)
Has been using Flonase and Claritin daily. She can increase her Claritin to bid and see if that helps.

## 2021-01-05 NOTE — Progress Notes (Signed)
Subjective:    Patient ID: Pamela Sanders, female    DOB: [birth year], 55 y.o.   MRN: [id number]  Chief Complaint  Patient presents with   Sinus Problem    For weeks and no relief    HPI Patient is in today for evaluation of 2 months worth of sinus pressure and nasal congestion. She has been using her Claritin and Flonase daily but the congestion persists. She has facial pressure. She is noting PND with cough worse in the morning. Sputum is yellow. She notes some pain in her right ear and right side of her face. Denies CP/palp/SOB/fevers/GI or GU c/o. Taking meds as prescribed   Past Medical History:  Diagnosis Date   Allergic rhinitis    Allergic rhinitis 03/07/2008   Qualifier: Diagnosis of  By: Redmond Pulling MD, Karren Cobble of this note might be different from the original. Overview:  Qualifier: Diagnosis of  By: Redmond Pulling MD, LauraLee   Chest pain 12/31/2019   Chest tightness 01/29/2020   Chronic inflammatory arthritis 03/07/2008   Qualifier: Diagnosis of  By: Redmond Pulling MD, Karren Cobble of this note might be different from the original. Overview:  Qualifier: Diagnosis of  By: Redmond Pulling MD, LauraLee   Depression    Depression (emotion)    DIVERTICULITIS, HX OF 03/07/2008   Qualifier: Diagnosis of  By: Redmond Pulling MD, LauraLee     DJD (degenerative joint disease)    chronic neck pain   General medical examination 12/03/2010   GERD 03/07/2008   Qualifier: Diagnosis of  By: Redmond Pulling MD, Karren Cobble of this note might be different from the original. Overview:  Qualifier: Diagnosis of  By: Redmond Pulling MD, Frann Rider   GERD (gastroesophageal reflux disease)    Hip pain    History of diverticulitis of colon    Hx of diverticulitis of colon    Insomnia    Lateral epicondylitis of right elbow    Leukocytopenia    Migraine    Migraine without aura 03/07/2008   Qualifier: Diagnosis of  By: Redmond Pulling MD, LauraLee     Neck pain, chronic    NECK PAIN, CHRONIC 03/07/2008   Qualifier: Diagnosis  of  By: Redmond Pulling MD, LauraLee     Osteoarthritis    Pain of left thumb    Pain of right sternoclavicular joint    Phantosmia    Routine general medical examination at a health care facility 08/09/2013   Sinusitis    Sternoclavicular joint pain, right 10/15/2019   TMJ (dislocation of temporomandibular joint)    Tremor of both hands 03/08/2020    Past Surgical History:  Procedure Laterality Date   APPENDECTOMY     CERVICAL DISCECTOMY     Dr. Harl Bowie 11/2009- [facility]   HEMORROIDECTOMY     OVARIAN CYST SURGERY     abdominal surgery for ovarian cyst   TISSUE GRAFT  02/07/15   pt reports gum graft for receeding gums--Dr Geralynn Ochs   TMJ ARTHROSCOPY     TMJ surgery    Family History  Problem Relation Age of Onset   Arthritis Other    Breast cancer Other    Coronary artery disease Maternal Grandmother    Breast cancer Maternal Grandmother        carcinoid tumor in abd   Hypertension Other    Diverticulitis Mother    Arthritis Mother    Pancreatic cancer Other     Social History   Socioeconomic History   Marital status:  Married    Spouse name: [name]   Number of children: 0   Years of education: Not on file   Highest education level: Doctorate  Occupational History   Occupation: Probation officer: [employer]  Tobacco Use   Smoking status: Never   Smokeless tobacco: Never  Vaping Use   Vaping Use: Never used  Substance and Sexual Activity   Alcohol use: Yes    Alcohol/week: 0.0 standard drinks    Comment: 2 drinks weekly, wine   Drug use: No   Sexual activity: Not on file  Other Topics Concern   Not on file  Social History Narrative   Domestic Partner   no children   She grew up in [place], [place]    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 She lives with her wife in a 2 story home. She drinks  1-2 cups of coffee a day and 1-2 glasses of tea. She exercises regularly.   Social Determinants of Health   Financial Resource Strain: Not on file  Food Insecurity: Not on file  Transportation Needs: Not on file  Physical Activity: Not on file  Stress: Not on file  Social Connections: Not on file  Intimate Partner Violence: Not on file    Outpatient Medications Prior to Visit  Medication Sig Dispense Refill   augmented betamethasone dipropionate (DIPROLENE-AF) 0.05 % ointment Apply 1 application topically as needed (eczema).     b complex vitamins capsule Take 1 capsule by mouth daily.      buPROPion (WELLBUTRIN XL) 150 MG 24 hr tablet Take 2 tablets (300 mg total) by mouth daily. 60 tablet 0   calcium citrate-vitamin D (CITRACAL+D) 315-200 MG-UNIT per tablet Take 2 tablets by mouth daily.     CAPSAICIN EX Apply 1 application topically as needed (muscle pain).     cyclobenzaprine (FLEXERIL) 5 MG tablet Take 5 mg by mouth as needed.     diphenhydrAMINE (BENADRYL) 25 MG tablet Take 25 mg by mouth daily as needed for itching or allergies.      famotidine (PEPCID AC) 10 MG chewable tablet Chew 10 mg by mouth 2 (two) times daily.     gabapentin (NEURONTIN) 300 MG capsule TAKE 2 CAPSULES 2 TIMES A DAY 360 capsule 1   loratadine (CLARITIN) 10 MG tablet Take 10 mg by mouth daily.     Melatonin 5 MG CAPS Take 5 mg by mouth at bedtime as needed (sleep).      Multiple Vitamins-Minerals (ONE-A-DAY WOMENS 50 PLUS) TABS Take 1 tablet by mouth daily.     norethindrone (AYGESTIN) 5 MG tablet Take 10 mg by mouth every 7 (seven) days. A month     primidone (MYSOLINE) 50 MG tablet 1 tablet at bedtime 30 tablet 0   Psyllium (METAMUCIL PO) Take 0.5 fluid ounces by mouth daily.     zolpidem (AMBIEN) 5 MG tablet Take 1 tablet (5 mg total) by mouth at bedtime as needed. for sleep 30 tablet 2   No facility-administered medications prior to visit.    Allergies  Allergen Reactions   Celebrex [Celecoxib]      Headaches   Penicillins Hives   Shingrix [Zoster Vac Recomb Adjuvanted]     Fever, myalgia, local redness  Review of Systems  Constitutional:  Positive for malaise/fatigue. Negative for fever.  HENT:  Positive for congestion, ear pain, sinus pain and sore throat. Negative for ear discharge.   Eyes:  Negative for blurred vision.  Respiratory:  Positive for cough and sputum production. Negative for shortness of breath and wheezing.   Cardiovascular:  Negative for chest pain, palpitations and leg swelling.  Gastrointestinal:  Negative for abdominal pain, blood in stool and nausea.  Genitourinary:  Negative for dysuria and frequency.  Musculoskeletal:  Negative for falls.  Skin:  Negative for rash.  Neurological:  Negative for dizziness, loss of consciousness and headaches.  Endo/Heme/Allergies:  Negative for environmental allergies.  Psychiatric/Behavioral:  Negative for depression. The patient is not nervous/anxious.       Objective:    Physical Exam Constitutional:      General: She is not in acute distress.    Appearance: She is well-developed.  HENT:     Head: Normocephalic and atraumatic.  Eyes:     Conjunctiva/sclera: Conjunctivae normal.  Neck:     Thyroid: No thyromegaly.  Cardiovascular:     Rate and Rhythm: Normal rate and regular rhythm.     Heart sounds: Normal heart sounds. No murmur heard. Pulmonary:     Effort: Pulmonary effort is normal. No respiratory distress.     Breath sounds: Normal breath sounds.  Abdominal:     General: Bowel sounds are normal. There is no distension.     Palpations: Abdomen is soft. There is no mass.     Tenderness: There is no abdominal tenderness.  Musculoskeletal:     Cervical back: Neck supple.  Lymphadenopathy:     Cervical: No cervical adenopathy.  Skin:    General: Skin is warm and dry.  Neurological:     Mental Status: She is alert and oriented to person, place, and time.  Psychiatric:        Behavior: Behavior  normal.    BP 114/68   Pulse 62   Temp 98.2 F (36.8 C)   Resp 16   Wt 172 lb 6.4 oz (78.2 kg)   SpO2 98%   BMI 27.00 kg/m  Wt Readings from Last 3 Encounters:  01/02/21 172 lb 6.4 oz (78.2 kg)  11/18/20 169 lb (76.7 kg)  11/13/20 169 lb 9.6 oz (76.9 kg)    Diabetic Foot Exam - Simple   No data filed    Lab Results  Component Value Date   WBC 4.8 01/02/2021   HGB 13.2 01/02/2021   HCT 41.2 01/02/2021   PLT 211.0 01/02/2021   GLUCOSE 73 01/02/2021   CHOL 175 09/25/2019   TRIG 157.0 (H) 09/25/2019   HDL 44.40 09/25/2019   LDLCALC 99 09/25/2019   ALT 21 01/02/2021   AST 22 01/02/2021   NA 139 01/02/2021   K 4.8 01/02/2021   CL 103 01/02/2021   CREATININE 0.93 01/02/2021   BUN 12 01/02/2021   CO2 29 01/02/2021   TSH 2.90 01/02/2021    Lab Results  Component Value Date   TSH 2.90 01/02/2021   Lab Results  Component Value Date   WBC 4.8 01/02/2021   HGB 13.2 01/02/2021   HCT 41.2 01/02/2021   MCV 88.6 01/02/2021   PLT 211.0 01/02/2021   Lab Results  Component Value Date   NA 139 01/02/2021   K 4.8 01/02/2021   CO2 29 01/02/2021   GLUCOSE 73 01/02/2021   BUN 12 01/02/2021   CREATININE 0.93 01/02/2021  BILITOT 0.4 01/02/2021   ALKPHOS 73 01/02/2021   AST 22 01/02/2021   ALT 21 01/02/2021   PROT 6.5 01/02/2021   ALBUMIN 4.1 01/02/2021   CALCIUM 9.0 01/02/2021   ANIONGAP 9 05/29/2020   GFR 69.08 01/02/2021   Lab Results  Component Value Date   CHOL 175 09/25/2019   Lab Results  Component Value Date   HDL 44.40 09/25/2019   Lab Results  Component Value Date   LDLCALC 99 09/25/2019   Lab Results  Component Value Date   TRIG 157.0 (H) 09/25/2019   Lab Results  Component Value Date   CHOLHDL 4 09/25/2019   No results found for: HGBA1C     Assessment & Plan:   Problem List Items Addressed This Visit     Leukocytopenia - Primary   Relevant Orders   CBC w/Diff (Completed)   Allergic rhinitis    Has been using Flonase and  Claritin daily. She can increase her Claritin to bid and see if that helps.        Sinusitis    Encouraged increased rest and hydration, add probiotics, zinc such as Coldeze or Xicam. Treat fevers as needed. Has had negative covid test. Given how long she has felt poorly will try prescribing a z pak and a medrol dose pak. If no response will need consider imaging and/or referral to ENT for further evaluation.        Relevant Medications   azithromycin (ZITHROMAX) 250 MG tablet   methylPREDNISolone (MEDROL) 4 MG tablet   RESOLVED: GERD   Relevant Orders   Comprehensive metabolic panel (Completed)   TSH (Completed)   Other Visit Diagnoses     Fatigue, unspecified type       Ear congestion, unspecified laterality       Relevant Orders   CBC w/Diff (Completed)   Comprehensive metabolic panel (Completed)   TSH (Completed)       I am having Pamela Sanders start on azithromycin and methylPREDNISolone. I am also having her maintain her calcium citrate-vitamin D, loratadine, famotidine, b complex vitamins, diphenhydrAMINE, Melatonin, norethindrone, augmented betamethasone dipropionate, zolpidem, Psyllium (METAMUCIL PO), CAPSAICIN EX, One-A-Day Womens 50 Plus, gabapentin, buPROPion, cyclobenzaprine, and primidone.  Meds ordered this encounter  Medications   azithromycin (ZITHROMAX) 250 MG tablet    Sig: Take 2 tablets on day 1, then 1 tablet daily on days 2 through 5    Dispense:  6 tablet    Refill:  0   methylPREDNISolone (MEDROL) 4 MG tablet    Sig: 5 tabs po x 1 day then 4 tabs po x 1 day then 3 tabs po x 1 day then 2 tabs po x 1 day then 1 tab po x 1 day and stop    Dispense:  15 tablet    Refill:  0     Penni Homans, MD

## 2021-01-05 NOTE — Assessment & Plan Note (Signed)
Encouraged increased rest and hydration, add probiotics, zinc such as Coldeze or Xicam. Treat fevers as needed. Has had negative covid test. Given how long she has felt poorly will try prescribing a z pak and a medrol dose pak. If no response will need consider imaging and/or referral to ENT for further evaluation.

## 2021-01-11 ENCOUNTER — Encounter: Payer: Self-pay | Admitting: Family Medicine

## 2021-01-13 ENCOUNTER — Encounter: Payer: Self-pay | Admitting: *Deleted

## 2021-01-13 ENCOUNTER — Other Ambulatory Visit: Payer: Self-pay | Admitting: Family Medicine

## 2021-01-13 ENCOUNTER — Other Ambulatory Visit (HOSPITAL_BASED_OUTPATIENT_CLINIC_OR_DEPARTMENT_OTHER): Payer: Self-pay

## 2021-01-13 MED ORDER — METHYLPREDNISOLONE 4 MG PO TABS
ORAL_TABLET | ORAL | 0 refills | Status: DC
  Filled 2021-01-13: qty 15, 5d supply, fill #0

## 2021-01-13 MED ORDER — CEFDINIR 300 MG PO CAPS
300.0000 mg | ORAL_CAPSULE | Freq: Two times a day (BID) | ORAL | 0 refills | Status: DC
  Filled 2021-01-13: qty 20, 10d supply, fill #0

## 2021-01-13 NOTE — Telephone Encounter (Signed)
Spoke with patient and she does not want to take any chances, because she has a anaphylactic reaction and she also cannot take benadryl due to her having another issue.  I called and cancelled at pharmacy.

## 2021-01-13 NOTE — Addendum Note (Signed)
Addended by: Kem Boroughs D on: 01/13/2021 04:59 PM   Modules accepted: Orders

## 2021-01-14 ENCOUNTER — Other Ambulatory Visit (HOSPITAL_BASED_OUTPATIENT_CLINIC_OR_DEPARTMENT_OTHER): Payer: Self-pay

## 2021-01-14 MED ORDER — DOXYCYCLINE HYCLATE 100 MG PO TABS: 100.0000 mg | ORAL_TABLET | Freq: Two times a day (BID) | ORAL | 0 refills | Status: DC

## 2021-01-14 MED ORDER — DOXYCYCLINE HYCLATE 100 MG PO TABS
100.0000 mg | ORAL_TABLET | Freq: Two times a day (BID) | ORAL | 0 refills | Status: DC
  Filled 2021-01-14: qty 20, 10d supply, fill #0

## 2021-01-14 NOTE — Addendum Note (Signed)
Addended by: [name] on: 01/14/2021 05:02 PM   Modules accepted: Orders

## 2021-01-14 NOTE — Telephone Encounter (Signed)
Spoke to pt.  Rx sent to her pharmacy. Advised pt to let me know if symptoms do not improve.  Cancelled rx at [facility].

## 2021-01-14 NOTE — Telephone Encounter (Signed)
Just to clarify dose as '200mg'$  of doxy?

## 2021-01-17 ENCOUNTER — Telehealth: Payer: No Typology Code available for payment source | Admitting: Family Medicine

## 2021-01-21 ENCOUNTER — Encounter: Payer: Self-pay | Admitting: Family

## 2021-01-21 NOTE — Telephone Encounter (Signed)
Rx sent 8/09

## 2021-01-22 ENCOUNTER — Other Ambulatory Visit (HOSPITAL_BASED_OUTPATIENT_CLINIC_OR_DEPARTMENT_OTHER): Payer: Self-pay

## 2021-01-22 MED ORDER — SUMATRIPTAN SUCCINATE 50 MG PO TABS
50.0000 mg | ORAL_TABLET | ORAL | 0 refills | Status: DC | PRN
  Filled 2021-01-22: qty 9, 30d supply, fill #0

## 2021-01-22 NOTE — Addendum Note (Signed)
Addended by: [name] on: 01/22/2021 12:29 PM   Modules accepted: Orders

## 2021-01-24 ENCOUNTER — Other Ambulatory Visit (HOSPITAL_BASED_OUTPATIENT_CLINIC_OR_DEPARTMENT_OTHER): Payer: Self-pay

## 2021-01-24 ENCOUNTER — Other Ambulatory Visit: Payer: Self-pay

## 2021-01-24 MED ORDER — PREDNISONE 10 MG PO TABS
ORAL_TABLET | ORAL | 0 refills | Status: DC
  Filled 2021-01-24: qty 21, 6d supply, fill #0

## 2021-01-24 NOTE — Progress Notes (Signed)
Per pt ok to send Prednisone taper to pt pharmacy.

## 2021-01-26 ENCOUNTER — Other Ambulatory Visit: Payer: Self-pay | Admitting: Family

## 2021-01-26 ENCOUNTER — Other Ambulatory Visit: Payer: Self-pay | Admitting: Neurology

## 2021-01-28 ENCOUNTER — Other Ambulatory Visit: Payer: Self-pay | Admitting: Family

## 2021-01-30 ENCOUNTER — Other Ambulatory Visit: Payer: Self-pay

## 2021-01-30 DIAGNOSIS — G43001 Migraine without aura, not intractable, with status migrainosus: Secondary | ICD-10-CM

## 2021-01-30 MED ORDER — UBRELVY 100 MG PO TABS: 100.0000 mg | ORAL_TABLET | ORAL | 0 refills | Status: DC | PRN

## 2021-01-30 NOTE — Progress Notes (Signed)
Pt stop by to pick up samples of urbelvy.   Medication Samples have been provided to the patient.  Drug name: Hinda Kehr       Strength: 100 mg        Qty: 4  LOT: [id number]  Exp.Date: 12/22  Dosing instructions:   The patient has been instructed regarding the correct time, dose, and frequency of taking this medication, including desired effects and most common side effects.   Venetia Night 11:07 AM 01/30/2021

## 2021-02-01 ENCOUNTER — Other Ambulatory Visit: Payer: Self-pay | Admitting: Family

## 2021-02-09 ENCOUNTER — Encounter: Payer: Self-pay | Admitting: Family

## 2021-03-03 ENCOUNTER — Encounter: Payer: Self-pay | Admitting: Family

## 2021-03-04 ENCOUNTER — Ambulatory Visit: Payer: No Typology Code available for payment source | Admitting: Family

## 2021-03-04 ENCOUNTER — Other Ambulatory Visit: Payer: Self-pay

## 2021-03-04 ENCOUNTER — Other Ambulatory Visit (HOSPITAL_BASED_OUTPATIENT_CLINIC_OR_DEPARTMENT_OTHER): Payer: Self-pay

## 2021-03-04 VITALS — BP 131/86 | HR 80 | Temp 97.7°F | Resp 16 | Wt 171.0 lb

## 2021-03-04 DIAGNOSIS — J40 Bronchitis, not specified as acute or chronic: Secondary | ICD-10-CM

## 2021-03-04 MED ORDER — BENZONATATE 100 MG PO CAPS
100.0000 mg | ORAL_CAPSULE | Freq: Three times a day (TID) | ORAL | 0 refills | Status: DC | PRN
  Filled 2021-03-04: qty 20, 7d supply, fill #0

## 2021-03-04 MED ORDER — AZITHROMYCIN 250 MG PO TABS
ORAL_TABLET | ORAL | 0 refills | Status: AC
  Filled 2021-03-04: qty 6, 5d supply, fill #0

## 2021-03-04 NOTE — Patient Instructions (Signed)
Please begin azithromycin and as needed tessalon for cough.  Continue mucinex as needed.  Call if symptoms worsen or if symptoms are not improved in 3-4 days.

## 2021-03-04 NOTE — Assessment & Plan Note (Addendum)
New.  Will rx with azithromycin, tessalon prn cough, continue mucinex for congestion, tylenol as needed for headache. Pt is advised to call if symptoms worsen or if symptoms do not improve in 3-4 days.

## 2021-03-04 NOTE — Progress Notes (Signed)
Subjective:   By signing my name below, I, Pamela Sanders, attest that this documentation has been prepared under the direction and in the presence of [name], NP, 03/04/2021  Patient ID: Pamela Sanders, female    DOB: [birth year], 55 y.o.   MRN: [id number]  Chief Complaint  Patient presents with   Wheezing    Complains of wheezing in the morning with chest congestion   Cough    Complains of productive cough    HPI Patient is in today for an office visit.  Chest and cough: She complains of a cough and congestion that began at the end of her trip to [place] this month which ended about 10 days ago. She has been experiencing symptoms since that have not gotten better. She denies any fever or bod aches but does mention she has been experiencing some fatigue which could be due to her illness or the jet lag from her trip. She also mentions that she has been experiencing a headache since the onset of her symptoms.  Covid-19: When she returned from her trip overseas she took a Covid-19 test to rule out Covid-19 and the results were negative.  Flu: She has denied wanting to test for flu and states she has had flu in the past and this does not feel like it and she does not have a fever.  Medication: She has been taking Mucinex as needed but is interested in having something prescribed that will help target her symptoms better.  Immunizations: She will get her flu vaccine when she returns to work and recovers from her symptoms. She has received her updated Covid-19 booster vaccine.  Health Maintenance Due  Topic Date Due   INFLUENZA VACCINE  01/06/2021    Past Medical History:  Diagnosis Date   Allergic rhinitis    Allergic rhinitis 03/07/2008   Qualifier: Diagnosis of  By: Redmond Pulling MD, Karren Cobble of this note might be different from the original. Overview:  Qualifier: Diagnosis of  By: Redmond Pulling MD, LauraLee   Chest pain 12/31/2019   Chest tightness 01/29/2020   Chronic  inflammatory arthritis 03/07/2008   Qualifier: Diagnosis of  By: Redmond Pulling MD, Karren Cobble of this note might be different from the original. Overview:  Qualifier: Diagnosis of  By: Redmond Pulling MD, LauraLee   Depression    Depression (emotion)    DIVERTICULITIS, HX OF 03/07/2008   Qualifier: Diagnosis of  By: Redmond Pulling MD, LauraLee     DJD (degenerative joint disease)    chronic neck pain   General medical examination 12/03/2010   GERD 03/07/2008   Qualifier: Diagnosis of  By: Redmond Pulling MD, Karren Cobble of this note might be different from the original. Overview:  Qualifier: Diagnosis of  By: Redmond Pulling MD, Frann Rider   GERD (gastroesophageal reflux disease)    Hip pain    History of diverticulitis of colon    Hx of diverticulitis of colon    Insomnia    Lateral epicondylitis of right elbow    Leukocytopenia    Migraine    Migraine without aura 03/07/2008   Qualifier: Diagnosis of  By: Redmond Pulling MD, LauraLee     Neck pain, chronic    NECK PAIN, CHRONIC 03/07/2008   Qualifier: Diagnosis of  By: Redmond Pulling MD, LauraLee     Osteoarthritis    Pain of left thumb    Pain of right sternoclavicular joint    Phantosmia    Routine general medical examination  at a health care facility 08/09/2013   Sinusitis    Sternoclavicular joint pain, right 10/15/2019   TMJ (dislocation of temporomandibular joint)    Tremor of both hands 03/08/2020    Past Surgical History:  Procedure Laterality Date   APPENDECTOMY     CERVICAL DISCECTOMY     Dr. Harl Bowie 11/2009- [facility]   HEMORROIDECTOMY     OVARIAN CYST SURGERY     abdominal surgery for ovarian cyst   TISSUE GRAFT  02/07/15   pt reports gum graft for receeding gums--Dr Geralynn Ochs   TMJ ARTHROSCOPY     TMJ surgery    Family History  Problem Relation Age of Onset   Arthritis Other    Breast cancer Other    Coronary artery disease Maternal Grandmother    Breast cancer Maternal Grandmother        carcinoid tumor in abd   Hypertension Other    Diverticulitis  Mother    Arthritis Mother    Pancreatic cancer Other     Social History   Socioeconomic History   Marital status: Married    Spouse name: [name]   Number of children: 0   Years of education: Not on file   Highest education level: Doctorate  Occupational History   Occupation: Probation officer: [employer]  Tobacco Use   Smoking status: Never   Smokeless tobacco: Never  Vaping Use   Vaping Use: Never used  Substance and Sexual Activity   Alcohol use: Yes    Alcohol/week: 0.0 standard drinks    Comment: 2 drinks weekly, wine   Drug use: No   Sexual activity: Not on file  Other Topics Concern   Not on file  Social History Narrative   Domestic Partner   no children   She grew up in [place], [place]    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 She lives with her wife in a 2 story home. She drinks 1-2 cups of coffee a day and 1-2 glasses of tea. She exercises regularly.   Social Determinants of Health   Financial Resource Strain: Not on file  Food Insecurity: Not on file  Transportation Needs: Not on file  Physical Activity: Not on file  Stress: Not on file  Social Connections: Not on file  Intimate Partner Violence: Not on file    Outpatient Medications Prior to Visit  Medication Sig Dispense Refill   augmented betamethasone dipropionate (DIPROLENE-AF) 0.05 % ointment Apply 1 application topically as needed (eczema).     b complex vitamins capsule Take 1 capsule by mouth daily.      calcium citrate-vitamin D (CITRACAL+D) 315-200 MG-UNIT per tablet Take 2 tablets by mouth daily.     CAPSAICIN EX Apply 1 application topically as needed (muscle pain).     cyclobenzaprine (FLEXERIL) 5 MG tablet Take 5 mg by mouth as needed.     diphenhydrAMINE (BENADRYL) 25 MG tablet Take 25 mg by mouth daily  as needed for itching or allergies.      famotidine (PEPCID AC) 10 MG chewable tablet Chew 10 mg by mouth 2 (two) times daily.     gabapentin (NEURONTIN) 300 MG capsule TAKE 2 CAPSULES 2 TIMES A DAY 360 capsule 0   loratadine (  CLARITIN) 10 MG tablet Take 10 mg by mouth daily.     Melatonin 5 MG CAPS Take 5 mg by mouth at bedtime as needed (sleep).      Multiple Vitamins-Minerals (ONE-A-DAY WOMENS 50 PLUS) TABS Take 1 tablet by mouth daily.     norethindrone (AYGESTIN) 5 MG tablet Take 10 mg by mouth every 7 (seven) days. A month     predniSONE (DELTASONE) 10 MG tablet Take 6 tablets by mouth daily for 1 day, 5 tabs for 1 day, 4 tabs for 1 day, 3 tabs for 1 day, 2 tabs for 1 day, then 1 tab for 1 day 21 tablet 0   primidone (MYSOLINE) 50 MG tablet 1 tablet at bedtime 30 tablet 4   Psyllium (METAMUCIL PO) Take 0.5 fluid ounces by mouth daily.     SUMAtriptan (IMITREX) 50 MG tablet Take 1 tablet (50 mg total) by mouth every 2 (two) hours as needed for migraine. May repeat in 2 hours if headache persists or recurs. 10 tablet 0   Ubrogepant (UBRELVY) 100 MG TABS Take 100 mg by mouth as needed. 4 tablet 0   WELLBUTRIN XL 150 MG 24 hr tablet TAKE TWO TABLETS BY MOUTH DAILY 180 tablet 1   zolpidem (AMBIEN) 5 MG tablet Take 1 tablet (5 mg total) by mouth at bedtime as needed for sleep 30 tablet 2   doxycycline (VIBRA-TABS) 100 MG tablet Take 1 tablet (100 mg total) by mouth 2 (two) times daily. 20 tablet 0   methylPREDNISolone (MEDROL) 4 MG tablet Take 5 tablets by mouth for 1 day, then 4 tablets for 1 day, then 3 tablets for 1 day, then 2 tablets for 1 day, then 1 tablet for 1 day then stop 15 tablet 0   No facility-administered medications prior to visit.    Allergies  Allergen Reactions   Penicillins Anaphylaxis   Celebrex [Celecoxib]     Headaches   Shingrix [Zoster Vac Recomb Adjuvanted]     Fever, myalgia, local redness    Review of Systems  Constitutional:  Positive for malaise/fatigue.  Negative for chills and fever.  HENT:  Positive for congestion and sore throat.   Respiratory:  Positive for cough.   Musculoskeletal:  Negative for joint pain and myalgias.      Objective:    Physical Exam Constitutional:      General: She is not in acute distress.    Appearance: Normal appearance. She is not ill-appearing.  HENT:     Head: Normocephalic and atraumatic.     Right Ear: External ear normal.     Left Ear: External ear normal.  Eyes:     Extraocular Movements: Extraocular movements intact.     Pupils: Pupils are equal, round, and reactive to light.  Cardiovascular:     Rate and Rhythm: Normal rate and regular rhythm.     Heart sounds: Normal heart sounds. No murmur heard.   No gallop.  Pulmonary:     Effort: Pulmonary effort is normal. No respiratory distress.     Breath sounds: Normal breath sounds. No wheezing or rales.  Lymphadenopathy:     Cervical: No cervical adenopathy.  Skin:    General: Skin is warm and dry.  Neurological:     Mental Status: She is alert and oriented to person, place, and time.  Psychiatric:        Behavior: Behavior normal.        Judgment: Judgment normal.    BP 131/86 (BP Location:  Right Arm, Patient Position: Sitting, Cuff Size: Small)   Pulse 80   Temp 97.7 F (36.5 C) (Temporal)   Resp 16   Wt 171 lb (77.6 kg)   SpO2 100%   BMI 26.78 kg/m  Wt Readings from Last 3 Encounters:  03/04/21 171 lb (77.6 kg)  01/02/21 172 lb 6.4 oz (78.2 kg)  11/18/20 169 lb (76.7 kg)       Assessment & Plan:   Problem List Items Addressed This Visit       Unprioritized   Bronchitis - Primary    New.  Will rx with azithromycin, tessalon prn cough, continue mucinex for congestion, tylenol as needed for headache. Pt is advised to call if symptoms worsen or if symptoms do not improve in 3-4 days.       Meds ordered this encounter  Medications   azithromycin (ZITHROMAX) 250 MG tablet    Sig: Take 2 tablets on day 1, then 1 tablet  daily on days 2 through 5    Dispense:  6 tablet    Refill:  0    Order Specific Question:   Supervising Provider    Answer:   Penni Homans A [[id number]]   benzonatate (TESSALON) 100 MG capsule    Sig: Take 1 capsule (100 mg total) by mouth 3 (three) times daily as needed.    Dispense:  20 capsule    Refill:  0    Order Specific Question:   Supervising Provider    Answer:   Penni Homans A [[id number]]    I, [name], NP, personally preformed the services described in this documentation.  All medical record entries made by the scribe were at my direction and in my presence.  I have reviewed the chart and discharge instructions (if applicable) and agree that the record reflects my personal performance and is accurate and complete. 03/04/2021  I,Pamela Sanders,acting as a scribe for Nance Pear, NP.,have documented all relevant documentation on the behalf of Nance Pear, NP,as directed by  Nance Pear, NP while in the presence of Nance Pear, NP.  Nance Pear, NP

## 2021-03-05 ENCOUNTER — Ambulatory Visit: Payer: No Typology Code available for payment source | Admitting: Family

## 2021-03-25 ENCOUNTER — Encounter: Payer: Self-pay | Admitting: Family

## 2021-04-21 ENCOUNTER — Encounter: Payer: Self-pay | Admitting: Family

## 2021-04-27 ENCOUNTER — Other Ambulatory Visit: Payer: Self-pay | Admitting: Family

## 2021-04-29 ENCOUNTER — Telehealth: Payer: Self-pay | Admitting: Gastroenterology

## 2021-04-29 NOTE — Telephone Encounter (Signed)
Good Afternoon Dr. Bryan Lemma,  I received a request for transfer of care for this patient. Dr. Loletha Carrow has approved this request.  Do you agree as well?  Please advise, thank you.

## 2021-04-29 NOTE — Telephone Encounter (Signed)
It is OK with me if agreeable to Dr. Bryan Lemma.  - HD

## 2021-04-29 NOTE — Telephone Encounter (Signed)
Good Morning Dr. Loletha Carrow,  I received a call from this patient requesting a transfer of care due to location. Recently moved to [facility] and is preferring to continue her care with Dr. Bryan Lemma.  Please advise, thank you.

## 2021-04-30 ENCOUNTER — Encounter: Payer: Self-pay | Admitting: Gastroenterology

## 2021-05-07 ENCOUNTER — Encounter: Payer: Self-pay | Admitting: Gastroenterology

## 2021-05-08 HISTORY — PX: CERVICAL ABLATION: SHX5771

## 2021-05-08 NOTE — Telephone Encounter (Signed)
Difficult for me to make any formal recommendations for her since I have not seen her and she has not been seen in the clinic since 2019 by Dr. Loletha Carrow.  She does carry a diagnosis of IBS and much of the symptoms she describes in her message correlate with previous documentation.  The chronicity of her symptoms are at least somewhat reassuring that there is not a need to go to the ER, but certainly if additional symptoms arise such as fever, inability tolerate p.o. intake, bloody stools, etc., ER is fastest way to be evaluated for emergent/urgent issues.  Otherwise, low FODMAP diet and fiber supplementation reasonable based on review of history.  Can otherwise follow-up with me as scheduled or with her PCM in the interim if able to get in sooner.

## 2021-05-23 ENCOUNTER — Encounter: Payer: Self-pay | Admitting: Neurology

## 2021-05-23 ENCOUNTER — Other Ambulatory Visit: Payer: Self-pay

## 2021-05-23 ENCOUNTER — Telehealth (INDEPENDENT_AMBULATORY_CARE_PROVIDER_SITE_OTHER): Payer: No Typology Code available for payment source | Admitting: Neurology

## 2021-05-23 VITALS — Ht 67.0 in | Wt 170.0 lb

## 2021-05-23 DIAGNOSIS — G43001 Migraine without aura, not intractable, with status migrainosus: Secondary | ICD-10-CM | POA: Diagnosis not present

## 2021-05-23 DIAGNOSIS — G250 Essential tremor: Secondary | ICD-10-CM | POA: Diagnosis not present

## 2021-05-23 NOTE — Progress Notes (Signed)
Virtual Visit via Video Note The purpose of this virtual visit is to provide medical care while limiting exposure to the novel coronavirus.    Consent was obtained for video visit:  Yes.   Answered questions that patient had about telehealth interaction:  Yes.   I discussed the limitations, risks, security and privacy concerns of performing an evaluation and management service by telemedicine. I also discussed with the patient that there may be a patient responsible charge related to this service. The patient expressed understanding and agreed to proceed.  Pt location: Home Physician Location: office Name of referring provider:  [name], NP I connected with Pamela Sanders at patients initiation/request on 05/23/2021 at  3:30 PM EST by video enabled telemedicine application and verified that I am speaking with the correct person using two identifiers. Pt MRN:  [id number] Pt DOB:  [birth year] Video Participants:  Pamela Sanders  Assessment and Plan:   Essential tremor Migraine with or without aura, without status migrainosus, intractable  1  Primidone 50mg  at bedtime for ET 2  She will try Ubrelvy 100mg  at the earliest onset next time she has a migraine 3  Follow up 8 months.  History of Present Illness:  Pamela Sanders is a 55 year old right-handed female with degenerative joint disease with chronic neck pain s/p ACDF C5-C6 (2011) follows up for essential tremor.    UPDATE: Tremor: Currently taking primidone 50mg  at bedtime Controlled.  However, primdone makes her sleepy.  Migraines: Pamela Sanders. Migraines occur 1 every 2 months, usually lasting 2 days. She had a more severe migraine that was more holocephalic/tightness.  Pamela Sanders which helped.    Current NSAIDS:  Ibuprofen (for tension-type headache) Current analgesics:  Tylenol (for tension-type headache) Current triptans:  none Current ergotamine:  no Current anti-emetic:  no Current muscle relaxants:   no Current sleep aide:  Ambien Current Antihypertensive medications:  no Current Antidepressant medications:  Wellbutrin XL 150mg  daily Current Anticonvulsant medications:  Gabapentin 600mg  twice daily, primidone 50mg  at bedtime (ET) Current anti-CGRP:  Ubrelvy 100mg  Current Vitamins/Herbal/Supplements:  no Current Antihistamines/Decongestants: Benadryl, Claritin Other therapy:  no Other medications:  Ambien  HISTORY: She started noticing tremor in 2021.  It is bilateral, but worse on the right.  It occurs when hands are with use or outstretched.  It does not occur at rest.  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  Her hands will shake.  No trouble swallowing.  No trouble with walking.  No history of REM sleep behavior disorder.  She does occasionally drink wine but has not noticed if symptoms subside when drinking.  She reports that her great Sanders had Parkinson's disease but no known family history of essential tremor.   Works in a lab papet tremors fluctuates right worse than left - over a year -    Pamela Sanders.  No family history of tremors   Migraines/Tension-type headache/chronic neck pain: Onset:  1999 Location:  Left or right frontal/retro-orbital, chronic left sided neck pain.  She has constant pain in left jaw (history of jaw surgery for TMJ.  She uses a bite guard for bruxism.   Quality:  Stabbing/throbbing Intensity:  Moderate to severe.  She denies new headache, thunderclap headache or severe headache that wakes her from sleep. Aura:  Sometimes "bluish vision".  One time had tunnel vision in 2005. Prodrome:  no Postdrome:  no Associated symptoms:  Photophobia, phonophobia.  Sometimes tinnitus.  She denies associated nausea, vomiting, visual disturbance, autonomic symptoms or unilateral numbness or weakness. Duration:  1/2 to 1 day Frequency:  Once  every 5 to 6 months Frequency of abortive medication: 2 days a week Triggers/exacerbating factors:  Emotional stress, skipped meals, sleep deprivation Relieving factors:  Laying down in dark and quiet room Activity:  Avoids   She also has tension-type headaches in the back of her head radiating into the neck as well as headaches at the bridge of her nose.  They last a day.  They occur 2 days a week.    MRI of brain with and without contrast (01/10/13):  "1.  No acute intracranial abnormality.  2.  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  C5-C6 ACDF with anterior plate and screw fixation and complete graft incorporation.  No motion with flexion/extension to suggest pseudoarthrosis.  2.  Mild C4-C5 and C6-C7 degenerative disc disease.  Mild bilateral C7-T1 facet arthropathy.  3.  No acute fracture or prevertebral swelling.  4.  Multiple mandibular screws bilaterally."  Past NSAIDS:  etodolac Past analgesics:  no Past abortive triptans:  Sumatriptan (causes nausea) Past abortive ergotamine:  no Past muscle relaxants:  Tizanidine 4mg  (helped) Past anti-emetic:  Compazine (used for sumatriptan-induced nausea) Past antihypertensive medications:  no Past antidepressant medications:  Nortriptyline (did not tolerate) Past anticonvulsant medications:  Topiramate (disoriented), Keppra Past anti-CGRP:  no Past vitamins/Herbal/Supplements:  no Past antihistamines/decongestants:  no Other past therapies:  Botox (effective but caused welts and itching), massage, caffeine   Past Medical History: Past Medical History:  Diagnosis Date   Allergic rhinitis    Allergic rhinitis 03/07/2008   Qualifier: Diagnosis of  By: Redmond Pulling MD, Karren Cobble of this note might be different from the original. Overview:  Qualifier: Diagnosis of  By: Redmond Pulling MD, LauraLee   Chest pain 12/31/2019   Chest tightness 01/29/2020   Chronic inflammatory  arthritis 03/07/2008   Qualifier: Diagnosis of  By: Redmond Pulling MD, Karren Cobble of this note might be different from the original. Overview:  Qualifier: Diagnosis of  By: Redmond Pulling MD, LauraLee   Depression    Depression (emotion)    DIVERTICULITIS, HX OF 03/07/2008   Qualifier: Diagnosis of  By: Redmond Pulling MD, LauraLee     DJD (degenerative joint disease)    chronic neck pain   General medical examination 12/03/2010   GERD 03/07/2008   Qualifier: Diagnosis of  By: Redmond Pulling MD, Karren Cobble of this note might be different from the original. Overview:  Qualifier: Diagnosis of  By: Redmond Pulling MD, Frann Rider   GERD (gastroesophageal reflux disease)    Hip pain    History of diverticulitis of colon    Hx of diverticulitis of colon    Insomnia    Lateral epicondylitis of right elbow    Leukocytopenia    Migraine    Migraine without aura 03/07/2008   Qualifier: Diagnosis of  By: Redmond Pulling MD, LauraLee     Neck pain, chronic    NECK PAIN, CHRONIC 03/07/2008   Qualifier: Diagnosis of  By: Redmond Pulling MD, LauraLee     Osteoarthritis    Pain of left thumb    Pain of right sternoclavicular joint    Phantosmia    Routine general medical examination at a health care facility 08/09/2013   Sinusitis    Sternoclavicular  joint pain, right 10/15/2019   TMJ (dislocation of temporomandibular joint)    Tremor of both hands 03/08/2020    Medications: Outpatient Encounter Medications as of 05/23/2021  Medication Sig Note   augmented betamethasone dipropionate (DIPROLENE-AF) 0.05 % ointment Apply 1 application topically as needed (eczema).    b complex vitamins capsule Take 1 capsule by mouth daily.     benzonatate (TESSALON) 100 MG capsule Take 1 capsule (100 mg total) by mouth 3 (three) times daily as needed.    calcium citrate-vitamin D (CITRACAL+D) 315-200 MG-UNIT per tablet Take 2 tablets by mouth daily.    CAPSAICIN EX Apply 1 application topically as needed (muscle pain).    cyclobenzaprine (FLEXERIL) 5 MG  tablet Take 5 mg by mouth as needed.    diphenhydrAMINE (BENADRYL) 25 MG tablet Take 25 mg by mouth daily as needed for itching or allergies.     famotidine (PEPCID AC) 10 MG chewable tablet Chew 10 mg by mouth 2 (two) times daily. 11/13/2020: tablets   gabapentin (NEURONTIN) 300 MG capsule TAKE 2 CAPSULES 2 TIMES A DAY    loratadine (CLARITIN) 10 MG tablet Take 10 mg by mouth daily.    Melatonin 5 MG CAPS Take 5 mg by mouth at bedtime as needed (sleep).     Multiple Vitamins-Minerals (ONE-A-DAY WOMENS 50 PLUS) TABS Take 1 tablet by mouth daily.    norethindrone (AYGESTIN) 5 MG tablet Take 10 mg by mouth every 7 (seven) days. A month    predniSONE (DELTASONE) 10 MG tablet Take 6 tablets by mouth daily for 1 day, 5 tabs for 1 day, 4 tabs for 1 day, 3 tabs for 1 day, 2 tabs for 1 day, then 1 tab for 1 day    primidone (MYSOLINE) 50 MG tablet 1 tablet at bedtime    Psyllium (METAMUCIL PO) Take 0.5 fluid ounces by mouth daily.    SUMAtriptan (IMITREX) 50 MG tablet Take 1 tablet (50 mg total) by mouth every 2 (two) hours as needed for migraine. May repeat in 2 hours if headache persists or recurs.    Ubrogepant (UBRELVY) 100 MG TABS Take 100 mg by mouth as needed.    WELLBUTRIN XL 150 MG 24 hr tablet TAKE TWO TABLETS BY MOUTH DAILY    zolpidem (AMBIEN) 5 MG tablet Take 1 tablet (5 mg total) by mouth at bedtime as needed for sleep    No facility-administered encounter medications on file as of 05/23/2021.    Allergies: Allergies  Allergen Reactions   Penicillins Anaphylaxis   Celebrex [Celecoxib]     Headaches   Shingrix [Zoster Vac Recomb Adjuvanted]     Fever, myalgia, local redness    Family History: Family History  Problem Relation Age of Onset   Arthritis Other    Breast cancer Other    Coronary artery disease Maternal Sanders    Breast cancer Maternal Sanders        carcinoid tumor in abd   Hypertension Other    Diverticulitis Mother    Arthritis Mother    Pancreatic  cancer Other     Observations/Objective:   Height 5\' 7"  (1.702 m), weight 170 lb (77.1 kg). No acute distress.  Alert and oriented.  Speech fluent and not dysarthric.  Language intact.     Follow Up Instructions:    -I discussed the assessment and treatment plan with the patient. The patient was provided an opportunity to ask questions and all were answered. The patient agreed with the plan and  demonstrated an understanding of the instructions.   The patient was advised to call back or seek an in-person evaluation if the symptoms worsen or if the condition fails to improve as anticipated.   Dudley Major, DO

## 2021-05-27 ENCOUNTER — Ambulatory Visit: Payer: PRIVATE HEALTH INSURANCE | Admitting: Neurology

## 2021-06-11 ENCOUNTER — Ambulatory Visit (INDEPENDENT_AMBULATORY_CARE_PROVIDER_SITE_OTHER): Payer: PRIVATE HEALTH INSURANCE | Admitting: Gastroenterology

## 2021-06-11 ENCOUNTER — Other Ambulatory Visit: Payer: Self-pay

## 2021-06-11 ENCOUNTER — Encounter: Payer: Self-pay | Admitting: Gastroenterology

## 2021-06-11 VITALS — BP 110/72 | HR 83 | Ht 67.0 in | Wt 170.1 lb

## 2021-06-11 DIAGNOSIS — R110 Nausea: Secondary | ICD-10-CM

## 2021-06-11 DIAGNOSIS — R109 Unspecified abdominal pain: Secondary | ICD-10-CM | POA: Diagnosis not present

## 2021-06-11 DIAGNOSIS — K582 Mixed irritable bowel syndrome: Secondary | ICD-10-CM

## 2021-06-11 NOTE — Patient Instructions (Addendum)
If you are age 57 or older, your body mass index should be between 23-30. Your There is no height or weight on file to calculate BMI. If this is out of the aforementioned range listed, please consider follow up with your Primary Care Provider.  If you are age 16 or younger, your body mass index should be between 19-25. Your There is no height or weight on file to calculate BMI. If this is out of the aformentioned range listed, please consider follow up with your Primary Care Provider.   __________________________________________________________  The [facility] providers would like to encourage you to use [facility] to communicate with providers for non-urgent requests or questions.  Due to long hold times on the telephone, sending your provider a message by [facility] may be a faster and more efficient way to get a response.  Please allow 48 business hours for a response.  Please remember that this is for non-urgent requests.   Due to recent changes in healthcare laws, you may see the results of your imaging and laboratory studies on MyChart before your provider has had a chance to review them.  We understand that in some cases there may be results that are confusing or concerning to you. Not all laboratory results come back in the same time frame and the provider may be waiting for multiple results in order to interpret others.  Please give [place] 48 hours in order for your provider to thoroughly review all the results before contacting the office for clarification of your results.   Start fiber supplement, use either Metamucil, or Benefiber.  Please purchase the following medications over the counter and take as directed: FD Guard  Follow up in July 2023, Please call to schedule at [phone].   Thank you for choosing me and [facility].  Vito Cirigliano, D.O.

## 2021-06-11 NOTE — Progress Notes (Addendum)
Chief Complaint: Nausea without emesis, alternating bowel habits, decreased appetite  Referring Provider:    [name], NP   HPI:     Pamela Sanders is a 56 y.o. female with a history of diverticulosis, migraines, depression, cervicalgia, appendectomy 2007, cervical discectomy and fusion 2011, hemorrhoid surgery, ruptured ovarian cyst, reported history of adhesive disease, referred to the [facility] for evaluation of multiple GI symptoms to include LLQ pain, nausea without vomiting, decreased appetite, change in bowel habits.  Previously followed with Dr. Jerene Pitch as well as Dr. Loletha Carrow (last seen 10/2017; GERD, crampy lower abdominal pain, alternating constipation/diarrhea, bloating, belching).  Does report a history of diverticulosis but does not believe she has had diverticulitis. Has been previously diagnosed w/ IBS.  Her GI symptoms have been present for many years with an extensive previous work-up.  Apparently underwent ex lap many years ago for similar symptoms.    - Not much improvement with previous trial of dicyclomine (although didn't take very much) - Trial of hyoscyamine in 2019 (never took) - Will use Pepto Bismol (some improvement) and Tums prn (although does not take for reflux sxs)  - 2006: Evaluation at [facility] for the same with reportedly unremarkable EGD and colonoscopy.  No reports available for review. - 02/2011: CT: Normal - 03/2012: CT: Mild dilation of small bowel loops with fecalization in distal ileum, moderate colonic stool - 07/2014: Colonoscopy: Descending diverticulosis, internal/external hemorrhoids.  Repeat 10 years - 02/2017: Abdominal ultrasound: Normal  - 12/2020: Normal CBC, CMP, TSH  Recommended starting low FODMAP diet last month. Has used fiber supplement intermittenly over the years.  Exacerbation of cramping, bloating, gas with Metamucil, so not used regularly.  Nothing identified on food journal. Sxs occur 4/7 days per week.    Describes GI sxs as "not severe, but persistent". Can have alternating constipation and loose stools with urgency with associated lower abdomnal pain. Nausea without emesis which then reduces her appetite. Has been present for years as above.  No dysphagia.  Very rare postprandial mild heartburn.  Past Medical History:  Diagnosis Date   Allergic rhinitis    Allergic rhinitis 03/07/2008   Qualifier: Diagnosis of  By: Redmond Pulling MD, Karren Cobble of this note might be different from the original. Overview:  Qualifier: Diagnosis of  By: Redmond Pulling MD, LauraLee   Chest pain 12/31/2019   Chest tightness 01/29/2020   Chronic inflammatory arthritis 03/07/2008   Qualifier: Diagnosis of  By: Redmond Pulling MD, Karren Cobble of this note might be different from the original. Overview:  Qualifier: Diagnosis of  By: Redmond Pulling MD, LauraLee   Depression    Depression (emotion)    DIVERTICULITIS, HX OF 03/07/2008   Qualifier: Diagnosis of  By: Redmond Pulling MD, LauraLee     DJD (degenerative joint disease)    chronic neck pain   General medical examination 12/03/2010   GERD 03/07/2008   Qualifier: Diagnosis of  By: Redmond Pulling MD, Karren Cobble of this note might be different from the original. Overview:  Qualifier: Diagnosis of  By: Redmond Pulling MD, Frann Rider   GERD (gastroesophageal reflux disease)    Hip pain    History of diverticulitis of colon    Hx of diverticulitis of colon    Insomnia    Lateral epicondylitis of right elbow    Leukocytopenia    Migraine    Migraine without aura 03/07/2008   Qualifier: Diagnosis of  ByRedmond Pulling MD, LauraLee     Neck pain, chronic    NECK PAIN, CHRONIC 03/07/2008   Qualifier: Diagnosis of  By: Redmond Pulling MD, LauraLee     Osteoarthritis    Pain of left thumb    Pain of right sternoclavicular joint    Phantosmia    Routine general medical examination at a health care facility 08/09/2013   Sinusitis    Sternoclavicular joint pain, right 10/15/2019   TMJ (dislocation of  temporomandibular joint)    Tremor of both hands 03/08/2020     Past Surgical History:  Procedure Laterality Date   APPENDECTOMY     CERVICAL ABLATION  05/2021   CERVICAL DISCECTOMY     Dr. Harl Bowie 11/2009- [facility]   HEMORROIDECTOMY     OVARIAN CYST SURGERY     abdominal surgery for ovarian cyst   TISSUE GRAFT  02/07/2015   pt reports gum graft for receeding gums--Dr Geralynn Ochs   TMJ ARTHROSCOPY     TMJ surgery   Family History  Problem Relation Age of Onset   Arthritis Other    Breast cancer Other    Coronary artery disease Maternal Grandmother    Breast cancer Maternal Grandmother        carcinoid tumor in abd   Hypertension Other    Diverticulitis Mother    Arthritis Mother    Pancreatic cancer Other    Social History   Tobacco Use   Smoking status: Never   Smokeless tobacco: Never  Vaping Use   Vaping Use: Never used  Substance Use Topics   Alcohol use: Yes    Alcohol/week: 0.0 standard drinks    Comment: 2 drinks weekly, wine   Drug use: No   Current Outpatient Medications  Medication Sig Dispense Refill   augmented betamethasone dipropionate (DIPROLENE-AF) 0.05 % ointment Apply 1 application topically as needed (eczema).     b complex vitamins capsule Take 1 capsule by mouth daily.      calcium citrate-vitamin D (CITRACAL+D) 315-200 MG-UNIT per tablet Take 2 tablets by mouth daily.     CAPSAICIN EX Apply 1 application topically as needed (muscle pain).     cyclobenzaprine (FLEXERIL) 5 MG tablet Take 5 mg by mouth as needed.     diphenhydrAMINE (BENADRYL) 25 MG tablet Take 25 mg by mouth daily as needed for itching or allergies.      famotidine (PEPCID AC) 10 MG chewable tablet Chew 10 mg by mouth 2 (two) times daily.     gabapentin (NEURONTIN) 300 MG capsule TAKE 2 CAPSULES 2 TIMES A DAY 360 capsule 0   loratadine (CLARITIN) 10 MG tablet Take 10 mg by mouth daily.     Multiple Vitamins-Minerals (ONE-A-DAY WOMENS 50 PLUS) TABS Take 1 tablet by mouth daily.      primidone (MYSOLINE) 50 MG tablet 1 tablet at bedtime 30 tablet 4   Psyllium (METAMUCIL PO) Take 0.5 fluid ounces by mouth daily.     SUMAtriptan (IMITREX) 50 MG tablet Take 1 tablet (50 mg total) by mouth every 2 (two) hours as needed for migraine. May repeat in 2 hours if headache persists or recurs. 10 tablet 0   Ubrogepant (UBRELVY) 100 MG TABS Take 100 mg by mouth as needed. 4 tablet 0   WELLBUTRIN XL 150 MG 24 hr tablet TAKE TWO TABLETS BY MOUTH DAILY 180 tablet 1   zolpidem (AMBIEN) 5 MG tablet Take 1 tablet (5 mg total) by mouth at bedtime as needed for sleep 30 tablet 2  No current facility-administered medications for this visit.   Allergies  Allergen Reactions   Penicillins Anaphylaxis   Celebrex [Celecoxib]     Headaches   Shingrix [Zoster Vac Recomb Adjuvanted]     Fever, myalgia, local redness     Review of Systems: All systems reviewed and negative except where noted in HPI.     Physical Exam:    Wt Readings from Last 3 Encounters:  06/11/21 170 lb 2 oz (77.2 kg)  05/23/21 170 lb (77.1 kg)  03/04/21 171 lb (77.6 kg)    Ht 5\' 7"  (1.702 m)    Wt 170 lb 2 oz (77.2 kg)    BMI 26.65 kg/m  Constitutional:  Pleasant, in no acute distress. Psychiatric: Normal mood and affect. Behavior is normal. Cardiovascular: Normal rate, regular rhythm. No edema Pulmonary/chest: Effort normal and breath sounds normal. No wheezing, rales or rhonchi. Abdominal: Minimal discomfort in bilateral lower abdomen.  No rebound or guarding.  No peritoneal signs.  Soft, nondistended.  Bowel sounds active throughout. There are no masses palpable. No hepatomegaly. Neurological: Alert and oriented to person place and time. Skin: Skin is warm and dry. No rashes noted.   ASSESSMENT AND PLAN;   1) IBS mixed type 2) Nausea without emesis 3) Lower abdominal cramping  - Increase dietary fiber.  Provided with fiber chart handout today with detailed instructions - Start low FODMAP diet.   Provided with detailed instructions today - Ensure adequate hydration - Trial course of FD guard.  Can also consider IBgard depending on clinical response - Discussed the pathophysiology of IBS at length today - Does not want to trial any new medications such as Levsin, Levbid, etc. - Discussed benefits of yoga and regular exercise - Discussed the role/utility of repeat endoscopic evaluation.  We will discuss again pending response to the above  - RTC in 6 months or sooner prn    Lavena Bullion, DO, FACG  06/11/2021, 9:21 AM   [name], NP

## 2021-06-21 ENCOUNTER — Other Ambulatory Visit: Payer: Self-pay | Admitting: Neurology

## 2021-07-28 ENCOUNTER — Encounter: Payer: Self-pay | Admitting: Family

## 2021-07-28 ENCOUNTER — Telehealth: Payer: Self-pay

## 2021-07-28 NOTE — Telephone Encounter (Signed)
PA initiated via Covermymeds; KEY: [id number]. Awaiting determination.

## 2021-07-30 MED ORDER — BUPROPION HCL ER (XL) 300 MG PO TB24: 300.0000 mg | ORAL_TABLET | Freq: Every day | ORAL | 1 refills | Status: DC

## 2021-07-30 NOTE — Telephone Encounter (Signed)
PA denied.   Per your health plan's criteria, this drug is covered if you meet the following: You have tried or cannot use at least two drugs from the following: (a) Brand Viibryd or vilazodone*. (b) Citalopram tablet. (c) Desvenlafaxine ER extended release 24 hr tablet*. (d) Drizalma Sprinkle or duloxetine*. (e) Escitalopram. (f) Fetzima*. (g) Fluoxetine capsule. (h) Mirtazapine, brand Remeron, or brand Remeron SolTab. (i) Paroxetine, paroxetine ER, or brand Paxil suspension*. (j) Sertraline tablet. (k) Trintellix*. (l) Venlafaxine   Appeals must be done in writing and faxed to  [employer] AT [phone].   Case ID: [id number]

## 2021-07-30 NOTE — Telephone Encounter (Signed)
See mychart.  

## 2021-08-01 ENCOUNTER — Other Ambulatory Visit: Payer: Self-pay

## 2021-08-01 MED ORDER — BUPROPION HCL ER (XL) 300 MG PO TB24: 300.0000 mg | ORAL_TABLET | Freq: Every day | ORAL | 1 refills | Status: DC

## 2021-08-03 ENCOUNTER — Other Ambulatory Visit: Payer: Self-pay | Admitting: Family

## 2021-08-05 ENCOUNTER — Encounter: Payer: Self-pay | Admitting: Family

## 2021-08-05 ENCOUNTER — Ambulatory Visit: Payer: PRIVATE HEALTH INSURANCE | Admitting: Family Medicine

## 2021-08-05 ENCOUNTER — Other Ambulatory Visit (HOSPITAL_BASED_OUTPATIENT_CLINIC_OR_DEPARTMENT_OTHER): Payer: Self-pay

## 2021-08-05 ENCOUNTER — Encounter: Payer: Self-pay | Admitting: Family Medicine

## 2021-08-05 VITALS — BP 118/84 | HR 94 | Temp 98.0°F | Resp 18 | Ht 67.0 in | Wt 171.4 lb

## 2021-08-05 DIAGNOSIS — J029 Acute pharyngitis, unspecified: Secondary | ICD-10-CM | POA: Diagnosis not present

## 2021-08-05 LAB — POCT RAPID STREP A (OFFICE): Rapid Strep A Screen: NEGATIVE

## 2021-08-05 MED ORDER — AZITHROMYCIN 250 MG PO TABS
ORAL_TABLET | ORAL | 0 refills | Status: DC
  Filled 2021-08-05: qty 6, 5d supply, fill #0

## 2021-08-05 NOTE — Patient Instructions (Signed)
Pharyngitis °Pharyngitis is inflammation of the throat (pharynx). It is a very common cause of sore throat. Pharyngitis can be caused by a bacteria, but it is usually caused by a virus. Most cases of pharyngitis get better on their own without treatment. °What are the causes? °This condition may be caused by: °Infection by viruses (viral). Viral pharyngitis spreads easily from person to person (is contagious) through coughing, sneezing, and sharing of personal items or utensils such as cups, forks, spoons, and toothbrushes. °Infection by bacteria (bacterial). Bacterial pharyngitis may be spread by touching the nose or face after coming in contact with the bacteria, or through close contact, such as kissing. °Allergies. Allergies can cause buildup of mucus in the throat (post-nasal drip), leading to inflammation and irritation. Allergies can also cause blocked nasal passages, forcing breathing through the mouth, which dries and irritates the throat. °What increases the risk? °You are more likely to develop this condition if: °You are 5-24 years old. °You are exposed to crowded environments such as daycare, school, or dormitory living. °You live in a cold climate. °You have a weakened disease-fighting (immune) system. °What are the signs or symptoms? °Symptoms of this condition vary by the cause. Common symptoms of this condition include: °Sore throat. °Fatigue. °Low-grade fever. °Stuffy nose (nasal congestion) and cough. °Headache. °Other symptoms may include: °Glands in the neck (lymph nodes) that are swollen. °Skin rashes. °Plaque-like film on the throat or tonsils. This is often a symptom of bacterial pharyngitis. °Vomiting. °Red, itchy eyes (conjunctivitis). °Loss of appetite. °Joint pain and muscle aches. °Enlarged tonsils. °How is this diagnosed? °This condition may be diagnosed based on your medical history and a physical exam. Your health care provider will ask you questions about your illness and your  symptoms. °A swab of your throat may be done to check for bacteria (rapid strep test). Other lab tests may also be done, depending on the suspected cause, but these are rare. °How is this treated? °Many times, treatment is not needed for this condition. Pharyngitis usually gets better in 3-4 days without treatment. °Bacterial pharyngitis may be treated with antibiotic medicines. °Follow these instructions at home: °Medicines °Take over-the-counter and prescription medicines only as told by your health care provider. °If you were prescribed an antibiotic medicine, take it as told by your health care provider. Do not stop taking the antibiotic even if you start to feel better. °Use throat sprays to soothe your throat as told by your health care provider. °Children can get pharyngitis. Do not give your child aspirin because of the association with Reye's syndrome. °Managing pain °To help with pain, try: °Sipping warm liquids, such as broth, herbal tea, or warm water. °Eating or drinking cold or frozen liquids, such as frozen ice pops. °Gargling with a mixture of salt and water 3-4 times a day or as needed. To make salt water, completely dissolve ½-1 tsp (3-6 g) of salt in 1 cup (237 mL) of warm water. °Sucking on hard candy or throat lozenges. °Putting a cool-mist humidifier in your bedroom at night to moisten the air. °Sitting in the bathroom with the door closed for 5-10 minutes while you run hot water in the shower. ° °General instructions ° °Do not use any products that contain nicotine or tobacco. These products include cigarettes, chewing tobacco, and vaping devices, such as e-cigarettes. If you need help quitting, ask your health care provider. °Rest as told by your health care provider. °Drink enough fluid to keep your urine pale yellow. °How   is this prevented? °To help prevent becoming infected or spreading infection: °Wash your hands often with soap and water for at least 20 seconds. If soap and water are not  available, use hand sanitizer. °Do not touch your eyes, nose, or mouth with unwashed hands, and wash hands after touching these areas. °Do not share cups or eating utensils. °Avoid close contact with people who are sick. °Contact a health care provider if: °You have large, tender lumps in your neck. °You have a rash. °You cough up green, yellow-brown, or bloody mucus. °Get help right away if: °Your neck becomes stiff. °You drool or are unable to swallow liquids. °You cannot drink or take medicines without vomiting. °You have severe pain that does not go away, even after you take medicine. °You have trouble breathing, and it is not caused by a stuffy nose. °You have new pain and swelling in your joints such as the knees, ankles, wrists, or elbows. °These symptoms may represent a serious problem that is an emergency. Do not wait to see if the symptoms will go away. Get medical help right away. Call your local emergency services (911 in the [place] Do not drive yourself to the hospital. °Summary °Pharyngitis is redness, pain, and swelling (inflammation) of the throat (pharynx). °While pharyngitis can be caused by a bacteria, the most common causes are viral. °Most cases of pharyngitis get better on their own without treatment. °Bacterial pharyngitis is treated with antibiotic medicines. °This information is not intended to replace advice given to you by your health care provider. Make sure you discuss any questions you have with your health care provider. °Document Revised: 08/21/2020 Document Reviewed: 08/21/2020 °Elsevier Patient Education © 2022 [employer] ° °

## 2021-08-05 NOTE — Assessment & Plan Note (Signed)
abx per orders Strep neg  F/u prn

## 2021-08-05 NOTE — Progress Notes (Signed)
Established Patient Office Visit  Subjective:  Patient ID: Pamela Sanders, female    DOB: [birth year]  Age: 56 y.o. MRN: [id number]  CC:  Chief Complaint  Patient presents with   Sore Throat    Pt states sxs started over night and states pain with swallowing and having fatigue and nasal congestion. Neg COVID test.     HPI Pamela Sanders presents for sore throat that worsened overnight and this am it was severe.  No fever, no cough --- she had a neg covid test this am   Past Medical History:  Diagnosis Date   Allergic rhinitis    Allergic rhinitis 03/07/2008   Qualifier: Diagnosis of  By: Redmond Pulling MD, Karren Cobble of this note might be different from the original. Overview:  Qualifier: Diagnosis of  By: Redmond Pulling MD, LauraLee   Chest pain 12/31/2019   Chest tightness 01/29/2020   Chronic inflammatory arthritis 03/07/2008   Qualifier: Diagnosis of  By: Redmond Pulling MD, Karren Cobble of this note might be different from the original. Overview:  Qualifier: Diagnosis of  By: Redmond Pulling MD, LauraLee   Depression    Depression (emotion)    DIVERTICULITIS, HX OF 03/07/2008   Qualifier: Diagnosis of  By: Redmond Pulling MD, LauraLee     DJD (degenerative joint disease)    chronic neck pain   General medical examination 12/03/2010   GERD 03/07/2008   Qualifier: Diagnosis of  By: Redmond Pulling MD, Karren Cobble of this note might be different from the original. Overview:  Qualifier: Diagnosis of  By: Redmond Pulling MD, Frann Rider   GERD (gastroesophageal reflux disease)    Hip pain    History of diverticulitis of colon    Hx of diverticulitis of colon    Insomnia    Lateral epicondylitis of right elbow    Leukocytopenia    Migraine    Migraine without aura 03/07/2008   Qualifier: Diagnosis of  By: Redmond Pulling MD, LauraLee     Neck pain, chronic    NECK PAIN, CHRONIC 03/07/2008   Qualifier: Diagnosis of  By: Redmond Pulling MD, LauraLee     Osteoarthritis    Pain of left thumb    Pain of right sternoclavicular joint     Phantosmia    Routine general medical examination at a health care facility 08/09/2013   Sinusitis    Sternoclavicular joint pain, right 10/15/2019   TMJ (dislocation of temporomandibular joint)    Tremor of both hands 03/08/2020    Past Surgical History:  Procedure Laterality Date   APPENDECTOMY     CERVICAL ABLATION  05/2021   CERVICAL DISCECTOMY     Dr. Harl Bowie 11/2009- [facility]   COLONOSCOPY  08/03/2014   ESOPHAGOGASTRODUODENOSCOPY     Says it was normal but maybe a peptic ulcer. Early 2000's possibly [facility]   HEMORROIDECTOMY     OVARIAN CYST SURGERY     abdominal surgery for ovarian cyst   TISSUE GRAFT  02/07/2015   pt reports gum graft for receeding gums--Dr Geralynn Ochs   TMJ ARTHROSCOPY     TMJ surgery    Family History  Problem Relation Age of Onset   Arthritis Other    Breast cancer Other    Coronary artery disease Maternal Grandmother    Breast cancer Maternal Grandmother        carcinoid tumor in abd   Hypertension Other    Diverticulitis Mother    Arthritis Mother    Pancreatic cancer Other  Social History   Socioeconomic History   Marital status: Married    Spouse name: [name]   Number of children: 0   Years of education: Not on file   Highest education level: Doctorate  Occupational History   Occupation: Probation officer: [employer]  Tobacco Use   Smoking status: Never   Smokeless tobacco: Never  Vaping Use   Vaping Use: Never used  Substance and Sexual Activity   Alcohol use: Yes    Alcohol/week: 0.0 standard drinks    Comment: 2 drinks weekly, wine   Drug use: No   Sexual activity: Not on file  Other Topics Concern   Not on file  Social History Narrative   Domestic Partner   no children   She grew up in [place], [place]    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  She lives with her wife in a 2 story home. She drinks 1-2 cups of coffee a day and 1-2 glasses of tea. She exercises regularly.   Social Determinants of Health   Financial Resource Strain: Not on file  Food Insecurity: Not on file  Transportation Needs: Not on file  Physical Activity: Not on file  Stress: Not on file  Social Connections: Not on file  Intimate Partner Violence: Not on file    Outpatient Medications Prior to Visit  Medication Sig Dispense Refill   augmented betamethasone dipropionate (DIPROLENE-AF) 0.05 % ointment Apply 1 application topically as needed (eczema).     b complex vitamins capsule Take 1 capsule by mouth daily.      buPROPion (WELLBUTRIN XL) 300 MG 24 hr tablet Take 1 tablet (300 mg total) by mouth daily. 90 tablet 1   calcium citrate-vitamin D (CITRACAL+D) 315-200 MG-UNIT per tablet Take 2 tablets by mouth daily.     CAPSAICIN EX Apply 1 application topically as needed (muscle pain).     cyclobenzaprine (FLEXERIL) 5 MG tablet Take 5 mg by mouth as needed.     diphenhydrAMINE (BENADRYL) 25 MG tablet Take 25 mg by mouth daily as needed for itching or allergies.      famotidine (PEPCID AC) 10 MG chewable tablet Chew 10 mg by mouth 2 (two) times daily.     gabapentin (NEURONTIN) 300 MG capsule Take 2 capsules by mouth twice daily 360 capsule 0   loratadine (CLARITIN) 10 MG tablet Take 10 mg by mouth daily.     Multiple Vitamins-Minerals (ONE-A-DAY WOMENS 50 PLUS) TABS Take 1 tablet by mouth daily.     primidone (MYSOLINE) 50 MG tablet Take 1 tablet (50 mg total) by mouth nightly. 30 tablet 4   Psyllium (METAMUCIL PO) Take 0.5 fluid ounces by mouth daily.     SUMAtriptan (IMITREX) 50 MG tablet Take 1 tablet (50 mg total) by mouth every 2 (two) hours as needed for migraine. May repeat in 2 hours if headache persists or recurs. 10 tablet 0   Ubrogepant (UBRELVY) 100 MG TABS Take 100 mg by mouth as needed. 4 tablet 0   zolpidem (AMBIEN) 5 MG tablet Take 1 tablet (5  mg total) by mouth at bedtime as needed for sleep 30 tablet 2   No facility-administered medications prior to visit.    Allergies  Allergen  Reactions   Penicillins Anaphylaxis   Celebrex [Celecoxib]     Headaches   Shingrix [Zoster Vac Recomb Adjuvanted]     Fever, myalgia, local redness    ROS Review of Systems  Constitutional:  Positive for fatigue. Negative for chills and fever.  HENT:  Positive for congestion, postnasal drip, sinus pressure and sinus pain. Negative for rhinorrhea.   Respiratory:  Negative for cough, chest tightness, shortness of breath and wheezing.   Cardiovascular:  Negative for chest pain, palpitations and leg swelling.  Allergic/Immunologic: Negative for environmental allergies.     Objective:    Physical Exam Vitals and nursing note reviewed.  Constitutional:      Appearance: She is well-developed.  HENT:     Head: Normocephalic and atraumatic.     Nose: No congestion or rhinorrhea.     Mouth/Throat:     Pharynx: Posterior oropharyngeal erythema present.  Eyes:     Conjunctiva/sclera: Conjunctivae normal.  Neck:     Thyroid: No thyromegaly.     Vascular: No carotid bruit or JVD.  Cardiovascular:     Rate and Rhythm: Normal rate and regular rhythm.     Heart sounds: Normal heart sounds. No murmur heard. Pulmonary:     Effort: Pulmonary effort is normal. No respiratory distress.     Breath sounds: Normal breath sounds. No wheezing or rales.  Chest:     Chest wall: No tenderness.  Musculoskeletal:     Cervical back: Normal range of motion and neck supple.  Neurological:     Mental Status: She is alert and oriented to person, place, and time.   Strep --  neg    BP 118/84 (BP Location: Right Arm, Patient Position: Sitting, Cuff Size: Normal)    Pulse 94    Temp 98 F (36.7 C) (Oral)    Resp 18    Ht 5\' 7"  (1.702 m)    Wt 171 lb 6.4 oz (77.7 kg)    SpO2 99%    BMI 26.85 kg/m  Wt Readings from Last 3 Encounters:  08/05/21 171 lb 6.4 oz  (77.7 kg)  06/11/21 170 lb 2 oz (77.2 kg)  05/23/21 170 lb (77.1 kg)     Health Maintenance Due  Topic Date Due   COVID-19 Vaccine (6 - Booster for Winston series) 04/05/2021   PAP SMEAR-Modifier  06/28/2021    There are no preventive care reminders to display for this patient.  Lab Results  Component Value Date   TSH 2.90 01/02/2021   Lab Results  Component Value Date   WBC 4.8 01/02/2021   HGB 13.2 01/02/2021   HCT 41.2 01/02/2021   MCV 88.6 01/02/2021   PLT 211.0 01/02/2021   Lab Results  Component Value Date   NA 139 01/02/2021   K 4.8 01/02/2021   CO2 29 01/02/2021   GLUCOSE 73 01/02/2021   BUN 12 01/02/2021   CREATININE 0.93 01/02/2021   BILITOT 0.4 01/02/2021   ALKPHOS 73 01/02/2021   AST 22 01/02/2021   ALT 21 01/02/2021   PROT 6.5 01/02/2021   ALBUMIN 4.1 01/02/2021   CALCIUM 9.0 01/02/2021   ANIONGAP 9 05/29/2020   GFR 69.08 01/02/2021   Lab Results  Component Value Date   CHOL 175 09/25/2019   Lab Results  Component Value Date   HDL 44.40 09/25/2019   Lab Results  Component Value Date   LDLCALC 99 09/25/2019   Lab Results  Component Value Date   TRIG 157.0 (H) 09/25/2019  Lab Results  Component Value Date   CHOLHDL 4 09/25/2019   No results found for: HGBA1C    Assessment & Plan:   Problem List Items Addressed This Visit       Unprioritized   Sore throat - Primary    abx per orders Strep neg  F/u prn      Relevant Medications   azithromycin (ZITHROMAX Z-PAK) 250 MG tablet   Other Relevant Orders   POCT rapid strep A (Completed)    Meds ordered this encounter  Medications   azithromycin (ZITHROMAX Z-PAK) 250 MG tablet    Sig: Take 2 tablets by mouth on day 1 then 1 tablet daily on day 2 thru 5    Dispense:  6 each    Refill:  0    Follow-up: Return if symptoms worsen or fail to improve.    Ann Held, DO

## 2021-08-11 ENCOUNTER — Encounter: Payer: Self-pay | Admitting: Family

## 2021-08-12 MED ORDER — WELLBUTRIN XL 300 MG PO TB24: 300.0000 mg | ORAL_TABLET | Freq: Every day | ORAL | 1 refills | Status: DC

## 2021-08-12 NOTE — Telephone Encounter (Signed)
Will attempt an appeal for brand name Wellbutrin. PA initiated via [employer]eds; KEY: [id number]. Awaiting determination.  ?

## 2021-08-12 NOTE — Addendum Note (Signed)
Addended byDamita Dunnings D on: 08/12/2021 02:46 PM ? ? Modules accepted: Orders ? ?

## 2021-08-12 NOTE — Telephone Encounter (Signed)
Case ID number: [id number] ?

## 2021-08-12 NOTE — Telephone Encounter (Signed)
Appeal approved.  ? ?Brand WELLBUTRIN TAB XL '300MG'$  has been approved through 08/13/2022. ?

## 2021-08-25 IMAGING — CR DG CERVICAL SPINE 2 OR 3 VIEWS
5 series · 5 of 5 positions shown · non-contrast
Comparison: None

CLINICAL DATA: Chronic neck pain which has worsened over the last 3
days. Pain radiates into shoulder.

EXAM:
CERVICAL SPINE - 2-3 VIEW

[w cervical spine lat]
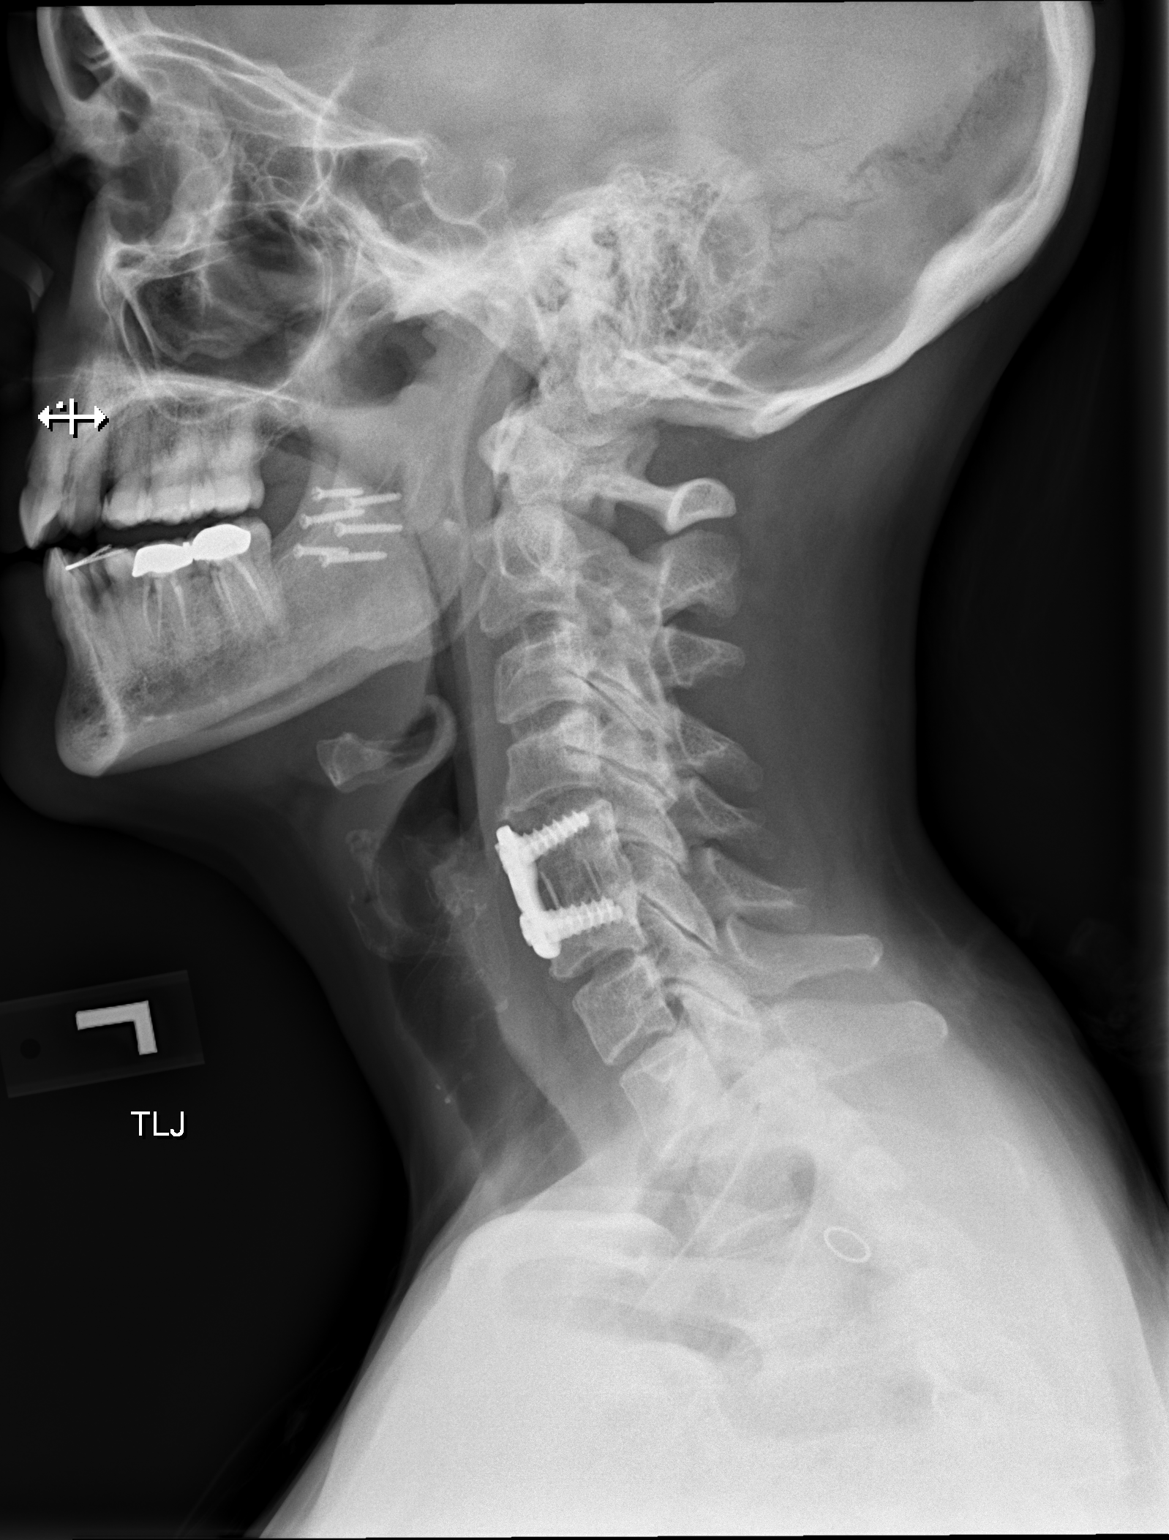

[w cervical spine ap]
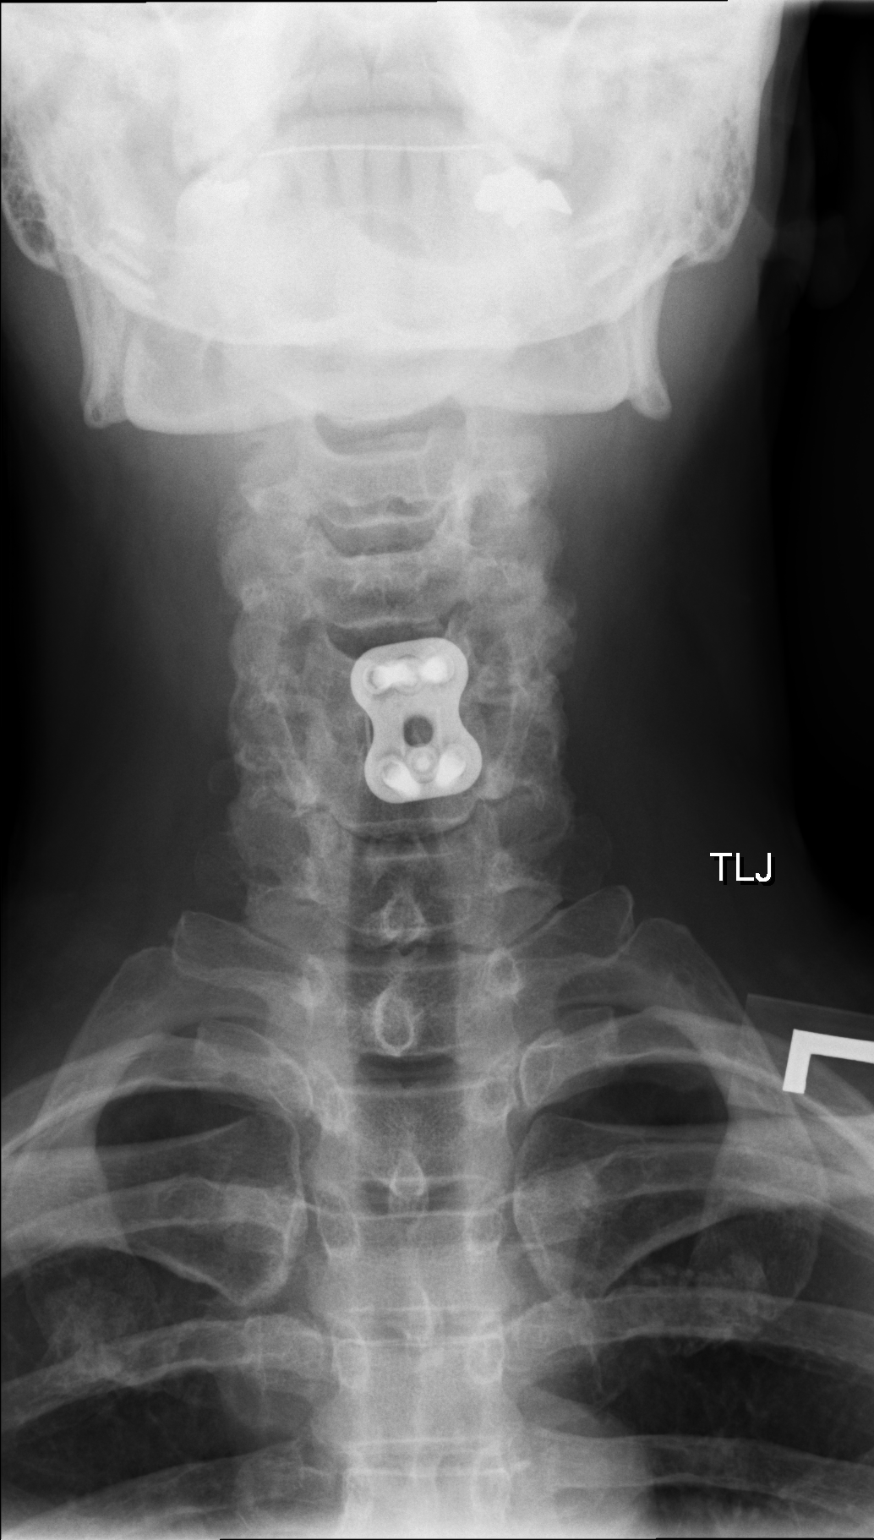

[w cervical spine odontoid (1 of 3)]
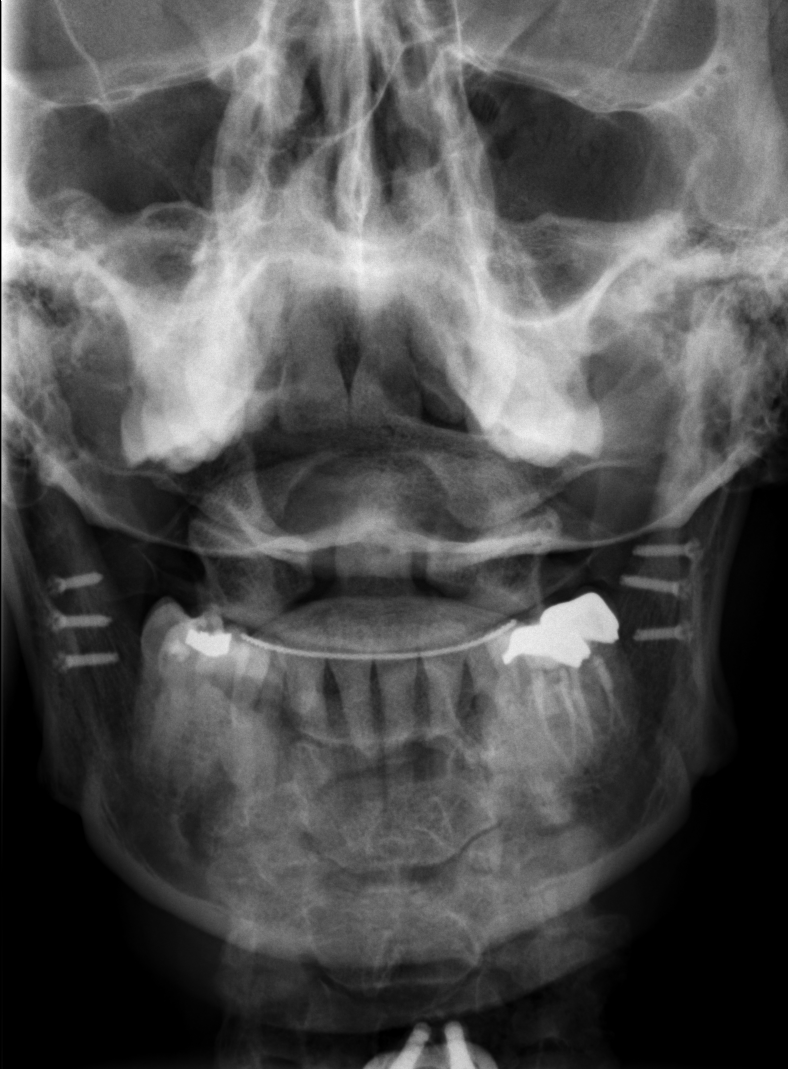

[w cervical spine odontoid (2 of 3)]
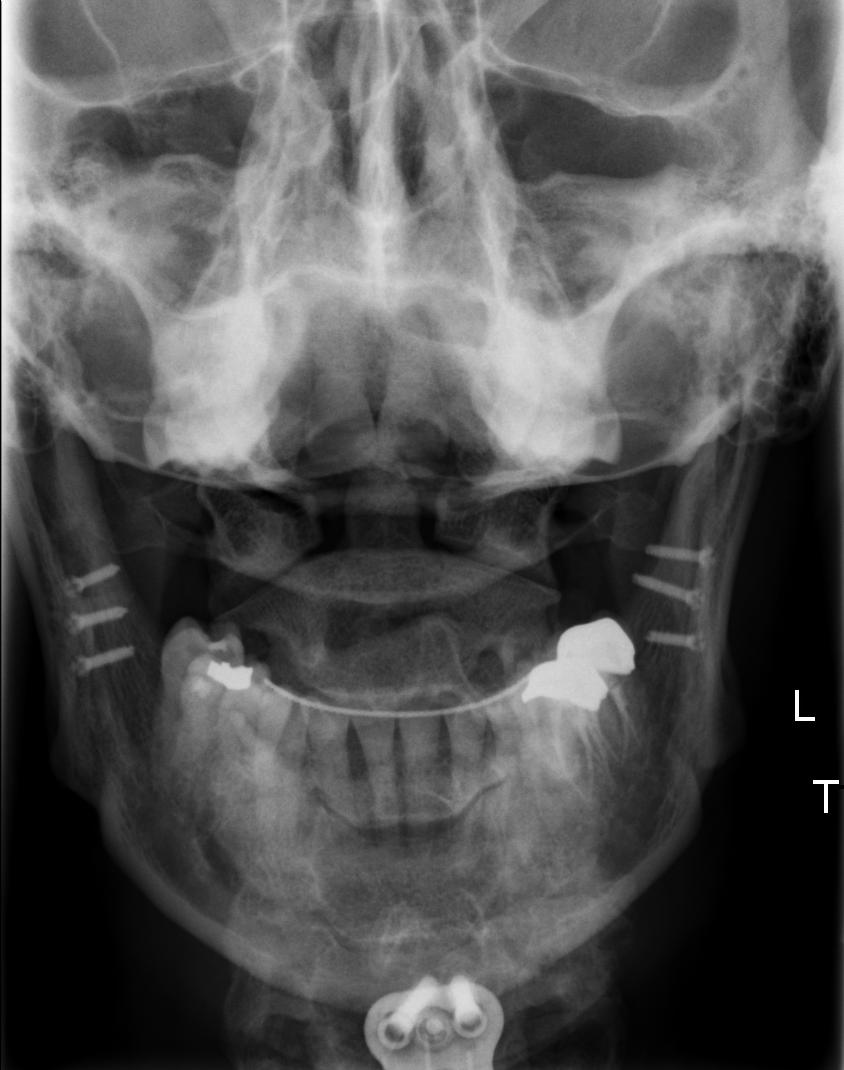

[w cervical spine odontoid (3 of 3)]
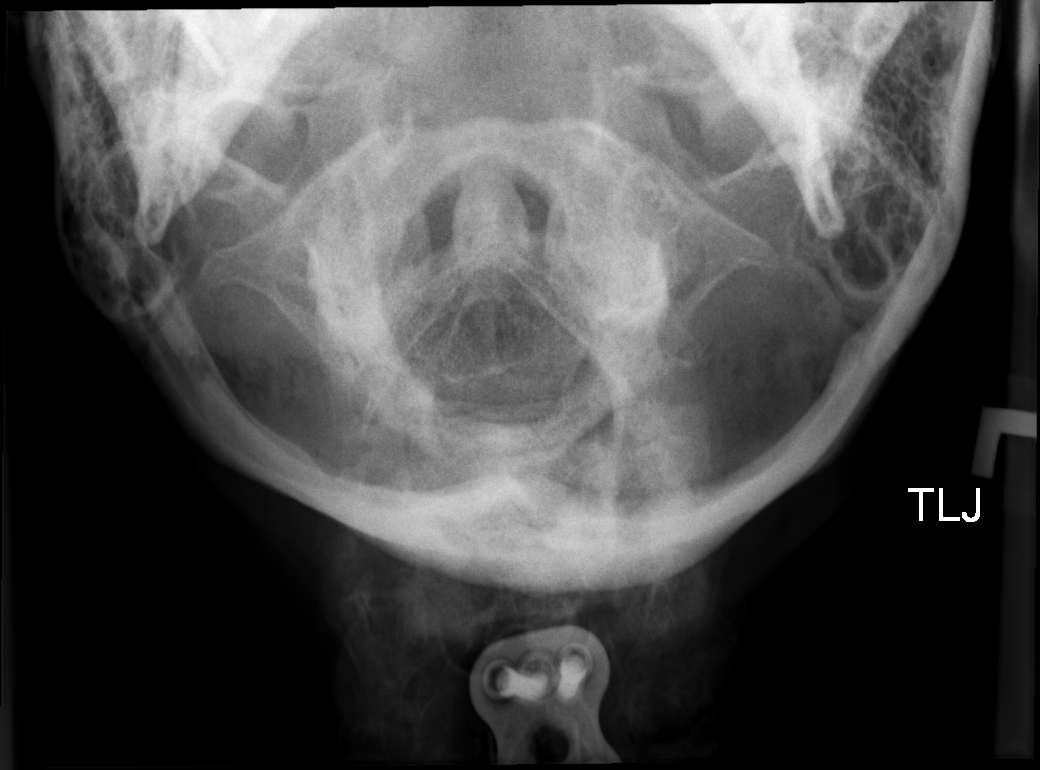

[5 of 5 positions shown; findings below may reference images not displayed]

FINDINGS: The alignment is normal. Signs of previous ACDF of C5-6 noted. There
appears to be solid fusion of the C5-6 disc space. The vertebral
body heights are well preserved. There is mild disc space narrowing
and endplate spurring noted at C4-5 and C6-7. No acute fracture or
dislocation identified.
IMPRESSION: 1. No acute findings.
2. Previous ACDF of C5-6.
3. Mild degenerative disc disease at C4-5 and C6-7.

## 2021-08-26 ENCOUNTER — Encounter: Payer: Self-pay | Admitting: Neurology

## 2021-08-27 ENCOUNTER — Other Ambulatory Visit: Payer: Self-pay

## 2021-08-27 DIAGNOSIS — G43001 Migraine without aura, not intractable, with status migrainosus: Secondary | ICD-10-CM

## 2021-08-27 MED ORDER — UBRELVY 100 MG PO TABS: 100.0000 mg | ORAL_TABLET | ORAL | 5 refills | Status: DC | PRN

## 2021-09-01 ENCOUNTER — Telehealth: Payer: Self-pay | Admitting: Pharmacy Technician

## 2021-09-01 NOTE — Telephone Encounter (Signed)
Patient Advocate Encounter ? ?Prior Authorization for Ubrelvy 100 mg tablets has been approved.   ? ?PA# [id number] ?Effective dates: 09/01/2021 through 12/02/2021 ? ? ? ? ? ?Lyndel Safe, CPhT ?Pharmacy Patient Advocate Specialist ?[place] Patient Advocate Team ?Direct Number: [phone]  Fax: [phone]  ?

## 2021-09-01 NOTE — Telephone Encounter (Signed)
Patient Advocate Encounter ? ?Received notification from [place] that prior authorization for UBRELVY '100MG'$  is required. ?  ?PA submitted on 3.27.23 ?Key [id number] ?Status is pending ?  ? Clinic will continue to follow ? ?Aeon Koors R Shahida Schnackenberg, CPhT ?Patient Advocate ?Phone: [phone] ?Fax:  [phone] ? ?

## 2021-10-11 ENCOUNTER — Encounter: Payer: Self-pay | Admitting: Gastroenterology

## 2021-10-12 ENCOUNTER — Emergency Department (HOSPITAL_BASED_OUTPATIENT_CLINIC_OR_DEPARTMENT_OTHER): Payer: PRIVATE HEALTH INSURANCE

## 2021-10-12 ENCOUNTER — Emergency Department (HOSPITAL_BASED_OUTPATIENT_CLINIC_OR_DEPARTMENT_OTHER)
Admission: EM | Admit: 2021-10-12 | Discharge: 2021-10-12 | Disposition: A | Discharge status: Home / Self Care | Payer: PRIVATE HEALTH INSURANCE | Attending: Emergency Medicine | Admitting: Emergency Medicine

## 2021-10-12 ENCOUNTER — Other Ambulatory Visit: Payer: Self-pay

## 2021-10-12 ENCOUNTER — Encounter (HOSPITAL_BASED_OUTPATIENT_CLINIC_OR_DEPARTMENT_OTHER): Payer: Self-pay | Admitting: Emergency Medicine

## 2021-10-12 DIAGNOSIS — N83202 Unspecified ovarian cyst, left side: Secondary | ICD-10-CM | POA: Diagnosis not present

## 2021-10-12 DIAGNOSIS — R1032 Left lower quadrant pain: Secondary | ICD-10-CM | POA: Diagnosis present

## 2021-10-12 DIAGNOSIS — N838 Other noninflammatory disorders of ovary, fallopian tube and broad ligament: Secondary | ICD-10-CM

## 2021-10-12 DIAGNOSIS — K5900 Constipation, unspecified: Secondary | ICD-10-CM | POA: Insufficient documentation

## 2021-10-12 LAB — BASIC METABOLIC PANEL
Anion gap: 3 — ABNORMAL LOW (ref 5–15)
BUN: 8 mg/dL (ref 6–20)
CO2: 26 mmol/L (ref 22–32)
Calcium: 9 mg/dL (ref 8.9–10.3)
Chloride: 110 mmol/L (ref 98–111)
Creatinine, Ser: 0.81 mg/dL (ref 0.44–1.00)
GFR, Estimated: >60 mL/min (ref >60)
Glucose, Bld: 108 mg/dL — ABNORMAL HIGH (ref 70–99)
Potassium: 3.8 mmol/L (ref 3.5–5.1)
Sodium: 139 mmol/L (ref 135–145)

## 2021-10-12 LAB — URINALYSIS, ROUTINE W REFLEX MICROSCOPIC
Bilirubin Urine: NEGATIVE
Glucose, UA: NEGATIVE mg/dL
Ketones, ur: NEGATIVE mg/dL
Leukocytes,Ua: NEGATIVE
Nitrite: NEGATIVE
Protein, ur: NEGATIVE mg/dL
Specific Gravity, Urine: 1.01 (ref 1.005–1.030)
pH: 7 (ref 5.0–8.0)

## 2021-10-12 LAB — CBC
HCT: 40.6 % (ref 36.0–46.0)
Hemoglobin: 13.4 g/dL (ref 12.0–15.0)
MCH: 29 pg (ref 26.0–34.0)
MCHC: 33 g/dL (ref 30.0–36.0)
MCV: 87.9 fL (ref 80.0–100.0)
Platelets: 215 10*3/uL (ref 150–400)
RBC: 4.62 MIL/uL (ref 3.87–5.11)
RDW: 13 % (ref 11.5–15.5)
WBC: 8.6 10*3/uL (ref 4.0–10.5)
nRBC: 0 % (ref 0.0–0.2)

## 2021-10-12 LAB — URINALYSIS, MICROSCOPIC (REFLEX)

## 2021-10-12 MED ORDER — ONDANSETRON 8 MG PO TBDP: 8.0000 mg | ORAL_TABLET | Freq: Three times a day (TID) | ORAL | 0 refills | Status: DC | PRN

## 2021-10-12 MED ORDER — TRAMADOL HCL 50 MG PO TABS
50.0000 mg | ORAL_TABLET | Freq: Four times a day (QID) | ORAL | 0 refills | Status: DC | PRN
Start: 2021-10-12 — End: 2021-11-07

## 2021-10-12 MED ORDER — IOHEXOL 300 MG/ML  SOLN
100.0000 mL | Freq: Once | INTRAMUSCULAR | Status: AC | PRN
  Administered 2021-10-12: 100 mL via INTRAVENOUS

## 2021-10-12 MED ORDER — ONDANSETRON HCL 4 MG/2ML IJ SOLN
4.0000 mg | Freq: Once | INTRAMUSCULAR | Status: AC
  Administered 2021-10-12: 4 mg via INTRAVENOUS
  Filled 2021-10-12: qty 2

## 2021-10-12 MED ORDER — HYDROMORPHONE HCL 1 MG/ML IJ SOLN
1.0000 mg | Freq: Once | INTRAMUSCULAR | Status: AC
  Administered 2021-10-12: 1 mg via INTRAVENOUS
  Filled 2021-10-12: qty 1

## 2021-10-12 NOTE — ED Notes (Signed)
ED Provider at bedside. 

## 2021-10-12 NOTE — ED Triage Notes (Signed)
Pt arrives pov, slow gait c/o LLQ pain and constipation x 3 days. Took miralax and stool softener with little relief ?

## 2021-10-12 NOTE — ED Provider Notes (Addendum)
[facility] EMERGENCY DEPARTMENT Provider Note   CSN: [id number] Arrival date & time: 10/12/21  4098     History  Chief Complaint  Patient presents with   Abdominal Pain    Pamela Sanders is a 56 y.o. female.  Patient c/o left lower abd pain in the past few days. Symptoms acute onset, moderate, persistent. Denies hx diverticulitis, ?hx diverticulosis. No hematuria or dysuria. No vaginal discharge or bleeding. Indicates hx intermittent constipation, states on chronic basis stools seem relatively small and/or thin. Has continue to have small bms. No abd distension. Indicates also sees GI for possible IBS.  Indicates tried enema and miralax but did not relieve discomfort. No fever or chills. No vomiting.   The history is provided by the patient and medical records.  Abdominal Pain Associated symptoms: constipation   Associated symptoms: no chest pain, no chills, no dysuria, no fever, no shortness of breath, no sore throat, no vaginal bleeding, no vaginal discharge and no vomiting       Home Medications Prior to Admission medications   Medication Sig Start Date End Date Taking? Authorizing Provider  Ubrogepant (UBRELVY) 100 MG TABS Take 100 mg by mouth as needed. 08/27/21   Drema Dallas, DO  augmented betamethasone dipropionate (DIPROLENE-AF) 0.05 % ointment Apply 1 application topically as needed (eczema).    [provider]  azithromycin (ZITHROMAX Z-PAK) 250 MG tablet Take 2 tablets by mouth on day 1 then 1 tablet daily on day 2 thru 5 08/05/21   Seabron Spates R, DO  b complex vitamins capsule Take 1 capsule by mouth daily.     [provider]  calcium citrate-vitamin D (CITRACAL+D) 315-200 MG-UNIT per tablet Take 2 tablets by mouth daily.    [provider]  CAPSAICIN EX Apply 1 application topically as needed (muscle pain).    [provider]  cyclobenzaprine (FLEXERIL) 5 MG tablet Take 5 mg by mouth as needed.    [provider]  diphenhydrAMINE (BENADRYL) 25 MG tablet Take 25 mg by mouth daily as needed for itching or allergies.     [provider]  famotidine (PEPCID AC) 10 MG chewable tablet Chew 10 mg by mouth 2 (two) times daily.    [provider]  gabapentin (NEURONTIN) 300 MG capsule Take 2 capsules by mouth twice daily 08/04/21   Sandford Craze, NP  loratadine (CLARITIN) 10 MG tablet Take 10 mg by mouth daily.    [provider]  Multiple Vitamins-Minerals (ONE-A-DAY WOMENS 50 PLUS) TABS Take 1 tablet by mouth daily. 06/10/20   Sandford Craze, NP  primidone (MYSOLINE) 50 MG tablet Take 1 tablet (50 mg total) by mouth nightly. 06/24/21   Drema Dallas, DO  Psyllium (METAMUCIL PO) Take 0.5 fluid ounces by mouth daily.    [provider]  SUMAtriptan (IMITREX) 50 MG tablet Take 1 tablet (50 mg total) by mouth every 2 (two) hours as needed for migraine. May repeat in 2 hours if headache persists or recurs. 01/22/21   Sandford Craze, NP  WELLBUTRIN XL 300 MG 24 hr tablet Take 1 tablet (300 mg total) by mouth daily. 08/12/21   Sandford Craze, NP  zolpidem (AMBIEN) 5 MG tablet Take 1 tablet (5 mg total) by mouth at bedtime as needed for sleep 01/27/21   Sandford Craze, NP      Allergies    Penicillins, Celebrex [celecoxib], and Shingrix [zoster vac recomb adjuvanted]    Review of Systems   Review of  Systems  Constitutional:  Negative for chills and fever.  HENT:  Negative for sore throat.   Eyes:  Negative for redness.  Respiratory:  Negative for shortness of breath.   Cardiovascular:  Negative for chest pain.  Gastrointestinal:  Positive for abdominal pain and constipation. Negative for vomiting.  Genitourinary:  Negative for dysuria, flank pain, vaginal bleeding and vaginal discharge.       Hx endometrial ablation. Post menopausal. No vaginal bleeding or discharge.   Musculoskeletal:  Negative for back pain.  Skin:  Negative for rash.   Neurological:  Negative for headaches.  Hematological:  Does not bruise/bleed easily.  Psychiatric/Behavioral:  Negative for confusion.    Physical Exam Updated Vital Signs BP 130/89   Pulse (!) 109   Temp 98.4 F (36.9 C) (Oral)   Resp 20   Ht 1.702 m (5\' 7" )   Wt 77.1 kg   SpO2 98%   BMI 26.63 kg/m  Physical Exam Vitals and nursing note reviewed.  Constitutional:      Appearance: Normal appearance. She is well-developed.  HENT:     Head: Atraumatic.     Nose: Nose normal.     Mouth/Throat:     Mouth: Mucous membranes are moist.  Eyes:     General: No scleral icterus.    Conjunctiva/sclera: Conjunctivae normal.  Neck:     Trachea: No tracheal deviation.  Cardiovascular:     Rate and Rhythm: Normal rate.     Pulses: Normal pulses.  Pulmonary:     Effort: Pulmonary effort is normal. No respiratory distress.  Abdominal:     General: Bowel sounds are normal. There is no distension.     Palpations: Abdomen is soft. There is no mass.     Tenderness: There is abdominal tenderness. There is no guarding.     Hernia: No hernia is present.     Comments: Mild lower abd tenderness.  Genitourinary:    Comments: No cva tenderness.  Musculoskeletal:        General: No swelling.     Cervical back: Neck supple. No muscular tenderness.  Skin:    General: Skin is warm and dry.     Findings: No rash.  Neurological:     Mental Status: She is alert.     Comments: Alert, speech normal.   Psychiatric:        Mood and Affect: Mood normal.    ED Results / Procedures / Treatments   Labs (all labs ordered are listed, but only abnormal results are displayed) Results for orders placed or performed during the hospital encounter of 10/12/21  CBC  Result Value Ref Range   WBC 8.6 4.0 - 10.5 K/uL   RBC 4.62 3.87 - 5.11 MIL/uL   Hemoglobin 13.4 12.0 - 15.0 g/dL   HCT 78.2 95.6 - 21.3 %   MCV 87.9 80.0 - 100.0 fL   MCH 29.0 26.0 - 34.0 pg   MCHC 33.0 30.0 - 36.0 g/dL   RDW 08.6  57.8 - 46.9 %   Platelets 215 150 - 400 K/uL   nRBC 0.0 0.0 - 0.2 %  Basic metabolic panel  Result Value Ref Range   Sodium 139 135 - 145 mmol/L   Potassium 3.8 3.5 - 5.1 mmol/L   Chloride 110 98 - 111 mmol/L   CO2 26 22 - 32 mmol/L   Glucose, Bld 108 (H) 70 - 99 mg/dL   BUN 8 6 - 20 mg/dL   Creatinine, Ser 6.29 0.44 -  1.00 mg/dL   Calcium 9.0 8.9 - 16.1 mg/dL   GFR, Estimated >09 >60 mL/min   Anion gap 3 (L) 5 - 15  Urinalysis, Routine w reflex microscopic Urine, Clean Catch  Result Value Ref Range   Color, Urine YELLOW YELLOW   APPearance CLEAR CLEAR   Specific Gravity, Urine 1.010 1.005 - 1.030   pH 7.0 5.0 - 8.0   Glucose, UA NEGATIVE NEGATIVE mg/dL   Hgb urine dipstick TRACE (A) NEGATIVE   Bilirubin Urine NEGATIVE NEGATIVE   Ketones, ur NEGATIVE NEGATIVE mg/dL   Protein, ur NEGATIVE NEGATIVE mg/dL   Nitrite NEGATIVE NEGATIVE   Leukocytes,Ua NEGATIVE NEGATIVE  Urinalysis, Microscopic (reflex)  Result Value Ref Range   RBC / HPF 0-5 0 - 5 RBC/hpf   WBC, UA 0-5 0 - 5 WBC/hpf   Bacteria, UA RARE (A) NONE SEEN   Squamous Epithelial / LPF 0-5 0 - 5     EKG None  Radiology CT Abdomen Pelvis W Contrast  Result Date: 10/12/2021 CLINICAL DATA:  Left lower quadrant pain and constipation 3 days. EXAM: CT ABDOMEN AND PELVIS WITH CONTRAST TECHNIQUE: Multidetector CT imaging of the abdomen and pelvis was performed using the standard protocol following bolus administration of intravenous contrast. RADIATION DOSE REDUCTION: This exam was performed according to the departmental dose-optimization program which includes automated exposure control, adjustment of the mA and/or kV according to patient size and/or use of iterative reconstruction technique. CONTRAST:  OMNIPAQUE IOHEXOL 300 MG/ML  SOLN COMPARISON:  04/01/2012 FINDINGS: Lower Chest: No acute findings. Hepatobiliary: No hepatic masses identified. Gallbladder is unremarkable. No evidence of biliary ductal dilatation.  Pancreas:  No mass or inflammatory changes. Spleen: Within normal limits in size and appearance. Adrenals/Urinary Tract: No masses identified. No evidence of ureteral calculi or hydronephrosis. Stomach/Bowel: Small hiatal hernia noted. No evidence of obstruction, inflammatory process or abnormal fluid collections. Mild sigmoid diverticulosis is noted, however there is no evidence of diverticulitis. Vascular/Lymphatic: No pathologically enlarged lymph nodes. No acute vascular findings. Reproductive: Uterus shows a cervical nabothian cyst but is otherwise unremarkable. An ovoid mass with intermediate attenuation is seen in the left adnexa which measures 6.2 x 3.0 cm. Adjacent soft tissue stranding is seen in the left adnexa with tiny amount of free fluid. Other:  None. Musculoskeletal:  No suspicious bone lesions identified. IMPRESSION: 6.2 cm ovoid mass in left adnexa with adjacent soft tissue stranding and tiny amount of free fluid. Differential diagnosis includes hemorrhagic ovarian cyst, endometrioma, tubo-ovarian abscess, and cystic ovarian neoplasm. Consider pelvic ultrasound or MRI for further characterization. Small hiatal hernia. Mild sigmoid diverticulosis. Electronically Signed   By: Danae Orleans M.D.   On: 10/12/2021 12:10   US PELVIC COMPLETE W TRANSVAGINAL AND TORSION R/O  Result Date: 10/12/2021 CLINICAL DATA:  Pelvic pain for 3 days.  Left adnexal mass EXAM: TRANSABDOMINAL AND TRANSVAGINAL ULTRASOUND OF PELVIS DOPPLER ULTRASOUND OF OVARIES TECHNIQUE: Both transabdominal and transvaginal ultrasound examinations of the pelvis were performed. Transabdominal technique was performed for global imaging of the pelvis including uterus, ovaries, adnexal regions, and pelvic cul-de-sac. It was necessary to proceed with endovaginal exam following the transabdominal exam to visualize the adnexa. Color and duplex Doppler ultrasound was utilized to evaluate blood flow to the ovaries. COMPARISON:  CT 10/12/2021  FINDINGS: Uterus Measurements: 7.3 x 3.1 x 4.7 cm = volume: 57 mL. No fibroids or other mass visualized. Multiple nabothian cysts at the level of the cervix, largest measuring up to 1.9 cm. Endometrium Thickness: 9 mm.  No focal abnormality visualized. Right ovary Not visualized.  Obscured by overlying shadowing bowel gas. Left ovary Measurements: Approximately 7.5 x 3.7 x 4.4 cm = volume: 66 mL. Lobulated mass within the left adnexa inseparable from the left ovary measures approximately 5.1 x 3.0 x 3.3 cm. Mass is predominantly hyperechoic with central area of relatively decreased echogenicity. No definite internal vascularity within this structure along color Doppler. Pulsed Doppler evaluation of the left ovary demonstrates normal low-resistance arterial and venous waveforms. Other findings Small volume free fluid within the pelvis and left adnexa. IMPRESSION: 1. Complex echogenic mass-like structure in the left adnexa measuring up to 5.1 cm. No definite blood flow within this mass on color Doppler. Venous and arterial waveforms were seen within adjacent left ovarian tissue. Differential remains tubo-ovarian abscess, cystic ovarian neoplasm, or possibly partial/intermittent adnexal torsion. Close clinical follow-up with short-term follow-up ultrasound is recommended. 2. The right ovary was not seen, obscured by shadowing bowel gas. 3. Small volume free fluid within the pelvis. Electronically Signed   By: Duanne Guess D.O.   On: 10/12/2021 13:42    Procedures Procedures    Medications Ordered in ED Medications - No data to display  ED Course/ Medical Decision Making/ A&P                           Medical Decision Making Problems Addressed: LLQ abdominal pain: acute illness or injury with systemic symptoms that poses a threat to life or bodily functions Ovarian mass, left: acute illness or injury with systemic symptoms that poses a threat to life or bodily functions    Details: discussed w pt,  mass vs cyst, and need gyn follow up.  Amount and/or Complexity of Data Reviewed External Data Reviewed: notes. Labs: ordered. Decision-making details documented in ED Course. Radiology: ordered and independent interpretation performed. Decision-making details documented in ED Course.  Risk Prescription drug management. Parenteral controlled substances. Decision regarding hospitalization.   Iv ns. Continuous pulse ox and cardiac monitoring. Labs ordered/sent. Imaging ordered.   Differential diagnosis considered including bowel obstruction, diverticulitis, ovarian cyst, ruptured cyst, torsion, etc.  Disposition decision including potential need for admission considered - will get labs and imaging and reassess.   Dilaudid iv.   Reviewed nursing notes and prior charts for additional history. External reports reviewed. Additional hx from: pcp/gi/tele visit notes.   Cardiac monitor: sinus rhythm, rate 94.  Labs reviewed/interpreted by me - wbc normal.   CT reviewed/interpreted by me - left ovarian mass/lesion/cyst.   U/s reviewed/interpreted by me - blood flow noted in left ovary. Left ovarian mass/cyst.  Discussed imaging w pt including need to f/u for left ovarian lesion, mass vs cyst, and need for close gyn f/u - pt indicates hx ovarian cysts, and is followed by [facility] gyn and will f/u there.   No fevers/chills, wbc normal, blood flow to ovary, pain controlled - pt appears stable for d/c.  Return precautions provided.          Final Clinical Impression(s) / ED Diagnoses Final diagnoses:  None    Rx / DC Orders ED Discharge Orders     None            Cathren Laine, MD 10/12/21 1408

## 2021-10-12 NOTE — Discharge Instructions (Addendum)
It was our pleasure to provide your ER care today - we hope that you feel better. ? ?Your imaging shows a left ovarian mass/lesion - follow up with your ob/gyn doctor in the next 1-2 weeks - call office Monday to arrange follow up.   ? ?Take acetaminophen or ibuprofen as need for pain. You may also take ultram as need for pain - no driving when taking. Take zofran as need for nausea.  ? ?If constipated, make sure to stay well hydrated/drink plenty of fluids, get adequate fiber in diet, take colace (stool softener) 2x/day, and miralax (laxative) once per day as need - these meds are available over the counter.  ? ?Return to ER if worse, new symptoms, fevers, worsening, severe or intractable pain, or other concern.  ? ?You were given pain meds in the ER - no driving for the next 6 hours.  ?

## 2021-10-13 NOTE — Telephone Encounter (Signed)
Thank you for the update.  CT results from the ER reviewed.  I agree with plan for MiraLAX/Gatorade purge prep for treatment of constipation and follow-up in the GYN clinic.  Can also schedule follow-up in the GI clinic for discussion of colonoscopy if ongoing LGI symptoms. ?

## 2021-10-14 ENCOUNTER — Other Ambulatory Visit: Payer: Self-pay

## 2021-10-14 ENCOUNTER — Other Ambulatory Visit: Payer: Self-pay | Admitting: Obstetrics & Gynecology

## 2021-10-14 ENCOUNTER — Encounter (HOSPITAL_COMMUNITY): Payer: Self-pay | Admitting: Obstetrics & Gynecology

## 2021-10-14 LAB — HM MAMMOGRAPHY

## 2021-10-14 NOTE — Progress Notes (Signed)
Spoke with pt for pre-op call. Pt denies cardiac history, HTN or Diabetes.   Shower instructions given to pt.  

## 2021-10-14 NOTE — H&P (Signed)
Pamela Sanders is an 56 y.o. female with acute LLQ pain since 5/5, worse on 5/7 needing ED visit, was diagnosed with left ovarian complex cyst 6.5 cm. No free fluid. No evidence of tubo-ovarian abscess, possible torsion. She is here for surgical intervention since pain is quite severe and mass has not reduced in size after 48 hr period.  ?She has h/o menorrhagia and  is s/p Novasure endometrial ablation in office in Jan'23, no menses since.  ?She is married to female partner no PID hx. Remote hx (1999) of hemorrhagic ovarian cyst with hemoperitoneum needing ex lap ?Nl Paps hx. No breast problems, nl mammograms, last was today  ? ?Office sono on 10/14/21  c/w sono on CT on 5/723  ?CA125 nl  ?What [place] 56 ? ?No LMP recorded. (Menstrual status: Perimenopausal). ?  ? ?Past Medical History:  ?Diagnosis Date  ? Allergic rhinitis   ? Allergic rhinitis 03/07/2008  ? Qualifier: Diagnosis of  By: Redmond Pulling MD, Karren Cobble of this note might be different from the original. Overview:  Qualifier: Diagnosis of  By: Redmond Pulling MD, Frann Rider  ? Chest pain 12/31/2019  ? Chest tightness 01/29/2020  ? Chronic inflammatory arthritis 03/07/2008  ? Qualifier: Diagnosis of  By: Redmond Pulling MD, Karren Cobble of this note might be different from the original. Overview:  Qualifier: Diagnosis of  By: Redmond Pulling MD, Frann Rider  ? Depression   ? Depression (emotion)   ? DIVERTICULITIS, HX OF 03/07/2008  ? Qualifier: Diagnosis of  By: Redmond Pulling MD, Frann Rider    ? DJD (degenerative joint disease)   ? chronic neck pain  ? General medical examination 12/03/2010  ? GERD 03/07/2008  ? Qualifier: Diagnosis of  By: Redmond Pulling MD, Karren Cobble of this note might be different from the original. Overview:  Qualifier: Diagnosis of  By: Redmond Pulling MD, Frann Rider  ? GERD (gastroesophageal reflux disease)   ? Hip pain   ? History of diverticulitis of colon   ? Hx of diverticulitis of colon   ? Insomnia   ? Lateral epicondylitis of right elbow   ? Leukocytopenia   ?  Migraine   ? Migraine without aura 03/07/2008  ? Qualifier: Diagnosis of  By: Redmond Pulling MD, Frann Rider    ? Neck pain, chronic   ? NECK PAIN, CHRONIC 03/07/2008  ? Qualifier: Diagnosis of  By: Redmond Pulling MD, Frann Rider    ? Osteoarthritis   ? Pain of left thumb   ? Pain of right sternoclavicular joint   ? Phantosmia   ? PONV (postoperative nausea and vomiting)   ? Routine general medical examination at a health care facility 08/09/2013  ? Sinusitis   ? Sternoclavicular joint pain, right 10/15/2019  ? TMJ (dislocation of temporomandibular joint)   ? Tremor of both hands 03/08/2020  ? ? ?Past Surgical History:  ?Procedure Laterality Date  ? APPENDECTOMY    ? CERVICAL ABLATION  05/2021  ? CERVICAL DISCECTOMY    ? Dr. Harl Bowie 11/2009- [place]  ? COLONOSCOPY  08/03/2014  ? ESOPHAGOGASTRODUODENOSCOPY    ? Says it was normal but maybe a peptic ulcer. Early 2000's possibly [employer]  ? HEMORROIDECTOMY    ? OVARIAN CYST SURGERY    ? abdominal surgery for ovarian cyst  ? TISSUE GRAFT  02/07/2015  ? pt reports gum graft for receeding gums--Dr Geralynn Ochs  ? TMJ ARTHROSCOPY    ? TMJ surgery  ? ? ?Family History  ?Problem Relation Age of Onset  ? Arthritis Other   ?  Breast cancer Other   ? Coronary artery disease Maternal Grandmother   ? Breast cancer Maternal Grandmother   ?     carcinoid tumor in abd  ? Hypertension Other   ? Diverticulitis Mother   ? Arthritis Mother   ? Pancreatic cancer Other   ? ? ?Social History:  reports that she has never smoked. She has never used smokeless tobacco. She reports current alcohol use. She reports that she does not use drugs. ? ?Allergies:  ?Allergies  ?Allergen Reactions  ? Penicillins Anaphylaxis  ? Celebrex [Celecoxib]   ?  Headaches  ? Shingrix [Zoster Vac Recomb Adjuvanted]   ?  Fever, myalgia, local redness  ? ? ?No medications prior to admission.  ? ? ?Review of Systems  LLQ pain. No bowel changes. No f/c/n/v.  ? ?There were no vitals taken for this visit. ?Physical Exam ?Physical exam:  ?A&O x 3, no acute  distress. Pleasant ?HEENT neg, no thyromegaly ?Lungs CTA bilat ?CV RRR, S1S2 normal ?Abdo soft, non tender, non acute ?Extr no edema/ tenderness ?Pelvic Uterus AV/ Normal. Left adnexal mass/ fullness/ tenderness  ? ? ? ?Assessment/Plan: ?56 yo female with new onset acute LLQ pain with 6.5 cm ovarian mass, acute pain. Nl CA125.  ?Proceed with Laparoscopy, bilateral salpingoophorectomy considering [place] is now menopausal. ?Risks/complications of surgery reviewed incl infection, bleeding, damage to internal organs including bladder, bowels, ureters, blood vessels, other risks from anesthesia, VTE and delayed complications of any surgery, complications in future surgery reviewed.  ?Risk of hot flashes with b/l oophorectomy vs keeping right ovary and risk of this recurring d/w pt. Prefers to remove right ovary as well and consider HRT if needed  ? ?Elveria Royals MD  ?Lynnell Chad ? ?

## 2021-10-15 ENCOUNTER — Ambulatory Visit (HOSPITAL_COMMUNITY)
Admission: RE | Admit: 2021-10-15 | Discharge: 2021-10-15 | Disposition: A | Discharge status: Home / Self Care | Payer: PRIVATE HEALTH INSURANCE | Attending: Obstetrics & Gynecology | Admitting: Obstetrics & Gynecology

## 2021-10-15 ENCOUNTER — Ambulatory Visit (HOSPITAL_BASED_OUTPATIENT_CLINIC_OR_DEPARTMENT_OTHER): Payer: PRIVATE HEALTH INSURANCE | Admitting: Certified Registered Nurse Anesthetist

## 2021-10-15 ENCOUNTER — Other Ambulatory Visit: Payer: Self-pay

## 2021-10-15 ENCOUNTER — Ambulatory Visit (HOSPITAL_COMMUNITY): Payer: PRIVATE HEALTH INSURANCE | Admitting: Certified Registered Nurse Anesthetist

## 2021-10-15 ENCOUNTER — Encounter (HOSPITAL_COMMUNITY): Payer: Self-pay | Admitting: Obstetrics & Gynecology

## 2021-10-15 ENCOUNTER — Encounter (HOSPITAL_COMMUNITY)
Admission: RE | Disposition: A | Discharge status: Home / Self Care | Payer: Self-pay | Source: Home / Self Care | Attending: Obstetrics & Gynecology

## 2021-10-15 DIAGNOSIS — D270 Benign neoplasm of right ovary: Secondary | ICD-10-CM | POA: Insufficient documentation

## 2021-10-15 DIAGNOSIS — N858 Other specified noninflammatory disorders of uterus: Secondary | ICD-10-CM | POA: Diagnosis not present

## 2021-10-15 DIAGNOSIS — K219 Gastro-esophageal reflux disease without esophagitis: Secondary | ICD-10-CM | POA: Insufficient documentation

## 2021-10-15 DIAGNOSIS — M1990 Unspecified osteoarthritis, unspecified site: Secondary | ICD-10-CM | POA: Diagnosis not present

## 2021-10-15 DIAGNOSIS — N8302 Follicular cyst of left ovary: Secondary | ICD-10-CM | POA: Insufficient documentation

## 2021-10-15 DIAGNOSIS — G709 Myoneural disorder, unspecified: Secondary | ICD-10-CM

## 2021-10-15 DIAGNOSIS — R102 Pelvic and perineal pain: Secondary | ICD-10-CM | POA: Diagnosis present

## 2021-10-15 DIAGNOSIS — D259 Leiomyoma of uterus, unspecified: Secondary | ICD-10-CM | POA: Insufficient documentation

## 2021-10-15 HISTORY — PX: LAPAROSCOPIC BILATERAL SALPINGO OOPHERECTOMY: SHX5890

## 2021-10-15 HISTORY — DX: Other specified postprocedural states: Z98.890

## 2021-10-15 HISTORY — DX: Other specified postprocedural states: R11.2

## 2021-10-15 LAB — TYPE AND SCREEN
ABO/RH(D): A POS
Antibody Screen: NEGATIVE

## 2021-10-15 LAB — CBC
HCT: 38.8 % (ref 36.0–46.0)
Hemoglobin: 12.8 g/dL (ref 12.0–15.0)
MCH: 29.2 pg (ref 26.0–34.0)
MCHC: 33 g/dL (ref 30.0–36.0)
MCV: 88.6 fL (ref 80.0–100.0)
Platelets: 223 10*3/uL (ref 150–400)
RBC: 4.38 MIL/uL (ref 3.87–5.11)
RDW: 12.7 % (ref 11.5–15.5)
WBC: 6.2 10*3/uL (ref 4.0–10.5)
nRBC: 0 % (ref 0.0–0.2)

## 2021-10-15 LAB — ABO/RH: ABO/RH(D): A POS

## 2021-10-15 SURGERY — SALPINGO-OOPHORECTOMY, BILATERAL, LAPAROSCOPIC
Anesthesia: General | Site: Abdomen

## 2021-10-15 MED ORDER — LACTATED RINGERS IV SOLN: INTRAVENOUS | Status: DC

## 2021-10-15 MED ORDER — SODIUM CHLORIDE 0.9 % IR SOLN
Status: DC | PRN
  Administered 2021-10-15: 1000 mL

## 2021-10-15 MED ORDER — FENTANYL CITRATE (PF) 250 MCG/5ML IJ SOLN
INTRAMUSCULAR | Status: DC | PRN
  Administered 2021-10-15 (×2): 50 ug via INTRAVENOUS
  Administered 2021-10-15: 100 ug via INTRAVENOUS

## 2021-10-15 MED ORDER — ROCURONIUM BROMIDE 10 MG/ML (PF) SYRINGE
PREFILLED_SYRINGE | INTRAVENOUS | Status: DC | PRN
  Administered 2021-10-15: 20 mg via INTRAVENOUS
  Administered 2021-10-15: 40 mg via INTRAVENOUS

## 2021-10-15 MED ORDER — VANCOMYCIN HCL IN DEXTROSE 1-5 GM/200ML-% IV SOLN
1000.0000 mg | Freq: Once | INTRAVENOUS | Status: AC
  Administered 2021-10-15: 1000 mg via INTRAVENOUS
  Filled 2021-10-15: qty 200

## 2021-10-15 MED ORDER — ONDANSETRON HCL 4 MG/2ML IJ SOLN
INTRAMUSCULAR | Status: DC | PRN
  Administered 2021-10-15: 4 mg via INTRAVENOUS

## 2021-10-15 MED ORDER — CHLORHEXIDINE GLUCONATE 0.12 % MT SOLN
15.0000 mL | Freq: Once | OROMUCOSAL | Status: AC
  Administered 2021-10-15: 15 mL via OROMUCOSAL
  Filled 2021-10-15: qty 15

## 2021-10-15 MED ORDER — 0.9 % SODIUM CHLORIDE (POUR BTL) OPTIME
TOPICAL | Status: DC | PRN
  Administered 2021-10-15: 1000 mL

## 2021-10-15 MED ORDER — GLYCOPYRROLATE PF 0.2 MG/ML IJ SOSY
PREFILLED_SYRINGE | INTRAMUSCULAR | Status: AC
  Filled 2021-10-15: qty 1

## 2021-10-15 MED ORDER — ACETAMINOPHEN 500 MG PO TABS: 500.0000 mg | ORAL_TABLET | Freq: Four times a day (QID) | ORAL | 0 refills | Status: DC | PRN

## 2021-10-15 MED ORDER — PROPOFOL 10 MG/ML IV BOLUS
INTRAVENOUS | Status: DC | PRN
  Administered 2021-10-15: 130 mg via INTRAVENOUS

## 2021-10-15 MED ORDER — BUPIVACAINE HCL (PF) 0.25 % IJ SOLN
INTRAMUSCULAR | Status: AC
  Filled 2021-10-15: qty 30

## 2021-10-15 MED ORDER — FENTANYL CITRATE (PF) 100 MCG/2ML IJ SOLN
25.0000 ug | INTRAMUSCULAR | Status: DC | PRN
  Administered 2021-10-15 (×3): 50 ug via INTRAVENOUS

## 2021-10-15 MED ORDER — MIDAZOLAM HCL 2 MG/2ML IJ SOLN
INTRAMUSCULAR | Status: AC
  Filled 2021-10-15: qty 2

## 2021-10-15 MED ORDER — SCOPOLAMINE 1 MG/3DAYS TD PT72
MEDICATED_PATCH | TRANSDERMAL | Status: DC | PRN
  Administered 2021-10-15: 1 via TRANSDERMAL

## 2021-10-15 MED ORDER — SUGAMMADEX SODIUM 200 MG/2ML IV SOLN
INTRAVENOUS | Status: DC | PRN
  Administered 2021-10-15: 160 mg via INTRAVENOUS

## 2021-10-15 MED ORDER — POVIDONE-IODINE 10 % EX SWAB
2.0000 "application " | Freq: Once | CUTANEOUS | Status: AC
  Administered 2021-10-15: 2 via TOPICAL

## 2021-10-15 MED ORDER — EPHEDRINE SULFATE-NACL 50-0.9 MG/10ML-% IV SOSY
PREFILLED_SYRINGE | INTRAVENOUS | Status: DC | PRN
  Administered 2021-10-15: 5 mg via INTRAVENOUS

## 2021-10-15 MED ORDER — METHYLENE BLUE 1 % INJ SOLN
INTRAVENOUS | Status: AC
  Filled 2021-10-15: qty 10

## 2021-10-15 MED ORDER — IBUPROFEN 200 MG PO TABS
600.0000 mg | ORAL_TABLET | Freq: Four times a day (QID) | ORAL | 0 refills | Status: DC | PRN
Start: 2021-10-15 — End: 2022-01-13

## 2021-10-15 MED ORDER — FENTANYL CITRATE (PF) 100 MCG/2ML IJ SOLN
INTRAMUSCULAR | Status: DC
Start: 2021-10-15 — End: 2021-10-15
  Filled 2021-10-15: qty 2

## 2021-10-15 MED ORDER — DEXAMETHASONE SODIUM PHOSPHATE 10 MG/ML IJ SOLN
INTRAMUSCULAR | Status: DC | PRN
  Administered 2021-10-15: 10 mg via INTRAVENOUS

## 2021-10-15 MED ORDER — MIDAZOLAM HCL 2 MG/2ML IJ SOLN
INTRAMUSCULAR | Status: DC | PRN
  Administered 2021-10-15: 2 mg via INTRAVENOUS

## 2021-10-15 MED ORDER — PHENYLEPHRINE HCL-NACL 20-0.9 MG/250ML-% IV SOLN
INTRAVENOUS | Status: DC | PRN
  Administered 2021-10-15: 35 ug/min via INTRAVENOUS

## 2021-10-15 MED ORDER — GENTAMICIN SULFATE 40 MG/ML IJ SOLN
5.0000 mg/kg | INTRAVENOUS | Status: AC
  Administered 2021-10-15: 390 mg via INTRAVENOUS
  Filled 2021-10-15: qty 9.75

## 2021-10-15 MED ORDER — EPHEDRINE 5 MG/ML INJ
INTRAVENOUS | Status: AC
  Filled 2021-10-15: qty 5

## 2021-10-15 MED ORDER — BUPIVACAINE HCL (PF) 0.25 % IJ SOLN
INTRAMUSCULAR | Status: DC | PRN
  Administered 2021-10-15: 14 mL

## 2021-10-15 MED ORDER — FENTANYL CITRATE (PF) 100 MCG/2ML IJ SOLN
INTRAMUSCULAR | Status: AC
  Filled 2021-10-15: qty 2

## 2021-10-15 MED ORDER — SCOPOLAMINE 1 MG/3DAYS TD PT72
MEDICATED_PATCH | TRANSDERMAL | Status: AC
  Filled 2021-10-15: qty 1

## 2021-10-15 MED ORDER — GLYCOPYRROLATE PF 0.2 MG/ML IJ SOSY
PREFILLED_SYRINGE | INTRAMUSCULAR | Status: DC | PRN
  Administered 2021-10-15: .1 mg via INTRAVENOUS

## 2021-10-15 MED ORDER — LIDOCAINE 2% (20 MG/ML) 5 ML SYRINGE
INTRAMUSCULAR | Status: DC | PRN
  Administered 2021-10-15: 60 mg via INTRAVENOUS

## 2021-10-15 MED ORDER — HYDROMORPHONE HCL 2 MG PO TABS: 2.0000 mg | ORAL_TABLET | Freq: Four times a day (QID) | ORAL | 0 refills | Status: DC | PRN

## 2021-10-15 MED ORDER — ORAL CARE MOUTH RINSE: 15.0000 mL | Freq: Once | OROMUCOSAL | Status: AC

## 2021-10-15 MED ORDER — ONDANSETRON HCL 4 MG PO TABS: 4.0000 mg | ORAL_TABLET | Freq: Three times a day (TID) | ORAL | 0 refills | Status: DC | PRN

## 2021-10-15 MED ORDER — FENTANYL CITRATE (PF) 250 MCG/5ML IJ SOLN
INTRAMUSCULAR | Status: AC
  Filled 2021-10-15: qty 5

## 2021-10-15 MED ORDER — PHENYLEPHRINE 80 MCG/ML (10ML) SYRINGE FOR IV PUSH (FOR BLOOD PRESSURE SUPPORT)
PREFILLED_SYRINGE | INTRAVENOUS | Status: AC
  Filled 2021-10-15: qty 10

## 2021-10-15 MED ORDER — HEMOSTATIC AGENTS (NO CHARGE) OPTIME
TOPICAL | Status: DC | PRN
  Administered 2021-10-15: 1

## 2021-10-15 SURGICAL SUPPLY — 40 items
ADH SKN CLS APL DERMABOND .7 (GAUZE/BANDAGES/DRESSINGS) ×1
BAG SPEC RTRVL LRG 6X4 10 (ENDOMECHANICALS)
CABLE HIGH FREQUENCY MONO STRZ (ELECTRODE) IMPLANT
CATH ROBINSON RED A/P 16FR (CATHETERS) ×3 IMPLANT
DERMABOND ADVANCED (GAUZE/BANDAGES/DRESSINGS) ×1
DERMABOND ADVANCED .7 DNX12 (GAUZE/BANDAGES/DRESSINGS) ×2 IMPLANT
DRAPE FEMORAL XLNG W WINDOW (DRAPES) ×1 IMPLANT
DRSG OPSITE POSTOP 3X4 (GAUZE/BANDAGES/DRESSINGS) IMPLANT
DURAPREP 26ML APPLICATOR (WOUND CARE) ×3 IMPLANT
FILTER SMOKE EVAC LAPAROSHD (FILTER) IMPLANT
GLOVE BIO SURGEON STRL SZ7 (GLOVE) ×3 IMPLANT
GLOVE BIOGEL PI IND STRL 7.0 (GLOVE) ×4 IMPLANT
GLOVE BIOGEL PI INDICATOR 7.0 (GLOVE) ×2
GOWN STRL REUS W/ TWL LRG LVL3 (GOWN DISPOSABLE) ×6 IMPLANT
GOWN STRL REUS W/TWL LRG LVL3 (GOWN DISPOSABLE) ×6
HEMOSTAT ARISTA ABSORB 3G PWDR (HEMOSTASIS) ×1 IMPLANT
IRRIG SUCT STRYKERFLOW 2 WTIP (MISCELLANEOUS) ×2
IRRIGATION SUCT STRKRFLW 2 WTP (MISCELLANEOUS) IMPLANT
KIT TURNOVER KIT B (KITS) ×3 IMPLANT
LIGASURE VESSEL 5MM BLUNT TIP (ELECTROSURGICAL) ×1 IMPLANT
MANIPULATOR UTERINE 4.5 ZUMI (MISCELLANEOUS) ×3 IMPLANT
NS IRRIG 1000ML POUR BTL (IV SOLUTION) ×3 IMPLANT
PACK LAPAROSCOPY BASIN (CUSTOM PROCEDURE TRAY) ×3 IMPLANT
PACK TRENDGUARD 450 HYBRID PRO (MISCELLANEOUS) IMPLANT
POUCH SPECIMEN RETRIEVAL 10MM (ENDOMECHANICALS) IMPLANT
PROTECTOR NERVE ULNAR (MISCELLANEOUS) ×6 IMPLANT
SCISSORS LAP 5X35 DISP (ENDOMECHANICALS) IMPLANT
SET TUBE SMOKE EVAC HIGH FLOW (TUBING) ×3 IMPLANT
SLEEVE ENDOPATH XCEL 5M (ENDOMECHANICALS) ×3 IMPLANT
SLEEVE Z-THREAD 5X100MM (TROCAR) ×1 IMPLANT
SOLUTION ELECTROLUBE (MISCELLANEOUS) IMPLANT
SPONGE T-LAP 18X18 ~~LOC~~+RFID (SPONGE) ×1 IMPLANT
SUT VICRYL 0 UR6 27IN ABS (SUTURE) ×3 IMPLANT
SUT VICRYL 4-0 PS2 18IN ABS (SUTURE) ×3 IMPLANT
TOWEL GREEN STERILE FF (TOWEL DISPOSABLE) ×6 IMPLANT
TRAY FOLEY W/BAG SLVR 14FR (SET/KITS/TRAYS/PACK) ×1 IMPLANT
TRENDGUARD 450 HYBRID PRO PACK (MISCELLANEOUS)
TROCAR ADV FIXATION 5X100MM (TROCAR) ×1 IMPLANT
TROCAR BALLN 12MMX100 BLUNT (TROCAR) ×1 IMPLANT
WARMER LAPAROSCOPE (MISCELLANEOUS) ×3 IMPLANT

## 2021-10-15 NOTE — Anesthesia Procedure Notes (Signed)
Procedure Name: Intubation ?Date/Time: 10/15/2021 1:48 PM ?Performed by: Janene Harvey, CRNA ?Pre-anesthesia Checklist: Patient identified, Emergency Drugs available, Suction available and Patient being monitored ?Patient Re-evaluated:Patient Re-evaluated prior to induction ?Oxygen Delivery Method: Circle system utilized ?Preoxygenation: Pre-oxygenation with 100% oxygen ?Induction Type: IV induction ?Ventilation: Mask ventilation without difficulty ?Laryngoscope Size: Mac and 4 ?Grade View: Grade I ?Tube type: Oral ?Tube size: 7.0 mm ?Number of attempts: 1 ?Airway Equipment and Method: Stylet and Oral airway ?Placement Confirmation: ETT inserted through vocal cords under direct vision, positive ETCO2 and breath sounds checked- equal and bilateral ?Secured at: 22 cm ?Tube secured with: Tape ?Dental Injury: Teeth and Oropharynx as per pre-operative assessment  ? ? ? ? ?

## 2021-10-15 NOTE — Op Note (Signed)
10/15/2021 ?Pamela Sanders ? ?Preoperative diagnosis: Acute left pelvic pain, left ovarian mass, normal CA125  ? ?Postoperative diagnosis: Uterine left fundal pedunculated mass, possible fibroid with torsion with adhesions  ? ?Procedure: Operative Laparoscopy, excision of uterine fundal mass, lysis of adhesions, bilateral salpingo oophorectomy, pelvic washings ? ?Surgeon: Azucena Fallen, MD ? ?Assistants: Derrell Lolling, CNM  ? ?Anesthesia: Gen Endotracheal ? ?IV fluids:  1000 LR ? ?EBL: 25 cc ? ?Urine clear: 500 cc ? ?Complications: none ? ?Disposition: PACU and home ? ?Specimens: Uterine mass, both fallopian tubes and ovaries  ? ?Procedure ?Patient presents operative laparoscopy. Risk and complications of surgery including infection, bleeding, damage to internal organs, other complications including pneumonia, VTE were reviewed. Patient voiced understanding. Informed written consent was obtained and patient was brought to the operating room with IV running. Vancomycin and Gentamicin given for surgical prophylaxis. Timeout was carried out. She underwent general anesthesia without difficulty and was given dorsal lithotomy position. Pelvic examination under anesthesia was normal. She was prepped and draped in standard fashion. Bladder was emptied with foley catheter. Speculum was placed anterior lip of cervix was grasped with tenaculum, uterus was sounded to 7 cm . Hulka manipulator was inserted and secured to cervix. Gloves were changed attention was focused on the abdomen.  ?A  10 mm curved incision was made at the lower edge of the umbilicus after injecting 0.25% marcaine.. Incision was carried down to the fascia, fascia lifted and incised. Peritoneal entry made. 10 mm Hassan trocar introduced and secured with balloon. Insufflation was begun with CO2 and a 0? laparoscope was introduced. No bleeding noted at entry site. Trendelenburg given.  ?One 5 mm trocar inserted in left and right lower quadrant each under vision  after injecting marcaine. Left fundal pedunculated smooth walled hard mass noted with torsion, possible fibroid, measured 6x4 cm and was ecchymotic and edematous. Copious irrigation and suction performed for pelvic washings. The mass was adherent to sigmoid colon but with hydro-dissection sigmoid was separated from it and was rolled back to mid abdomen. Inferiorly, the mass was adherent to cul-de sac and left ovarian fossa and left ovary. Left ovary was identified as the mass was separated with hydro-dissection. Left fallopian tube and ovary noted to be edematous. Ureter course noted. Ovarian fossa was quite raw from pealing off the mass with gentle blunt and hydro dissection. Right fallopian tube was normal and right ovary had some nodularity on it, so decision was made to perform bilateral salpingo-oophorectomy. Right ureter course identified as well. Left salpingo-oophorectomy performed first using Ligasure. Hemostasis was excellent. Uterine mass was excised by desiccating and cutting the torsed peduncle. Right ovary was adherent to ovarian fossa. First right salpingo-oophorectomy performed and ovarian adhesions were dissected by sharp dissection without energy. Superficial bleeding from adhesions in both the ovarian fossas was treated with Arista after suction and irrigation after removal of specimen at the end of surgery.  ?Laparoscope inserted from right lower port and 10 mm endo-bag inserted from central large port. Specimens including both fallopian tubes and ovaries and uterine mass were dropped in endobag, it was closed and brought to central port. Port removed, end-bag cut open at the umbilical port. Fascia and skin incision extended and after morcellating the mass to some degree by removing carefully with Kocher's, the specimen was small enough to come out within endo-bag. Central trocar placed again. Pneumoperitoneum created, pelvis assessed, ureters assessed and were normal. Arista sprayed, hemostasis  noted.  ?All trocars removed under vision and pneumoperitoneum was released.  Fascial incision closed with 0-Vicryl. The skin approximated using 4-0 Vicryl in subcuticular fashion. Dermabond was applied at the incision. The uterine manipulator and foley catheter removed. Hemostasis was excellent.  ? ?All counts were correct x 2.  Patient brought out to the recovery room after extubation in stable condition. Plan is to discharge home from recovery room. Surgical findings were discussed with patient's family.  ?Followup with Dr. Benjie Karvonen in office in 2 weeks.  ? ?I performed this procedure. ?-Azucena Fallen MD  ?Lynnell Chad  ?

## 2021-10-15 NOTE — Anesthesia Preprocedure Evaluation (Addendum)
Anesthesia Evaluation  ?Patient identified by MRN, date of birth, ID band ?Patient awake ? ? ? ?Reviewed: ?Allergy & Precautions, NPO status , Patient's Chart, lab work & pertinent test results ? ?History of Anesthesia Complications ?(+) PONV and history of anesthetic complications ? ?Airway ?Mallampati: II ? ?TM Distance: >3 FB ? ? ? ? Dental ?  ?Pulmonary ? ?  ?breath sounds clear to auscultation ? ? ? ? ? ? Cardiovascular ? ?Rhythm:Regular Rate:Normal ? ? ?  ?Neuro/Psych ? Headaches, PSYCHIATRIC DISORDERS  Neuromuscular disease   ? GI/Hepatic ?Neg liver ROS, GERD  ,  ?Endo/Other  ?negative endocrine ROS ? Renal/GU ?negative Renal ROS  ? ?  ?Musculoskeletal ? ?(+) Arthritis ,  ? Abdominal ?  ?Peds ? Hematology ?  ?Anesthesia Other Findings ? ? Reproductive/Obstetrics ? ?  ? ? ? ? ? ? ? ? ? ? ? ? ? ?  ?  ? ? ? ? ? ? ? ? ?Anesthesia Physical ?Anesthesia Plan ? ?ASA: 3 ? ?Anesthesia Plan: General  ? ?Post-op Pain Management:   ? ?Induction: Intravenous ? ?PONV Risk Score and Plan: 4 or greater and Ondansetron, Dexamethasone and Midazolam ? ?Airway Management Planned: Oral ETT ? ?Additional Equipment:  ? ?Intra-op Plan:  ? ?Post-operative Plan: Extubation in OR ? ?Informed Consent: I have reviewed the patients History and Physical, chart, labs and discussed the procedure including the risks, benefits and alternatives for the proposed anesthesia with the patient or authorized representative who has indicated his/her understanding and acceptance.  ? ? ? ?Dental advisory given ? ?Plan Discussed with: CRNA and Anesthesiologist ? ?Anesthesia Plan Comments:   ? ? ? ? ? ? ?Anesthesia Quick Evaluation ? ?

## 2021-10-15 NOTE — Anesthesia Postprocedure Evaluation (Signed)
Anesthesia Post Note ? ?Patient: Pamela Sanders ? ?Procedure(s) Performed: LAPAROSCOPIC BILATERAL SALPINGOOPHERECTOMY AND REMOVAL OF UTERINE MASS/PELVIC WASHINGS (Abdomen) ? ?  ? ?Patient location during evaluation: PACU ?Anesthesia Type: General ?Level of consciousness: awake ?Pain management: pain level controlled ?Vital Signs Assessment: post-procedure vital signs reviewed and stable ?Respiratory status: spontaneous breathing ?Cardiovascular status: stable ?Postop Assessment: no apparent nausea or vomiting ?Anesthetic complications: no ? ? ?No notable events documented. ? ?Last Vitals:  ?Vitals:  ? 10/15/21 1536 10/15/21 1551  ?BP: (!) 111/56 (!) 106/57  ?Pulse: 70 (!) 55  ?Resp: 13 15  ?Temp: 36.4 ?C   ?SpO2: 100% 100%  ?  ?Last Pain:  ?Vitals:  ? 10/15/21 1536  ?TempSrc:   ?PainSc: 7   ? ? ?  ?  ?  ?  ?  ?  ? ?Pamela Sanders ? ? ? ? ?

## 2021-10-15 NOTE — Transfer of Care (Signed)
Immediate Anesthesia Transfer of Care Note ? ?Patient: Pamela Sanders ? ?Procedure(s) Performed: LAPAROSCOPIC BILATERAL SALPINGOOPHERECTOMY AND REMOVAL OF UTERINE MASS/PELVIC WASHINGS (Abdomen) ? ?Patient Location: PACU ? ?Anesthesia Type:General ? ?Level of Consciousness: drowsy and patient cooperative ? ?Airway & Oxygen Therapy: Patient Spontanous Breathing and Patient connected to face mask oxygen ? ?Post-op Assessment: Report given to RN and Post -op Vital signs reviewed and stable ? ?Post vital signs: Reviewed and stable ? ?Last Vitals:  ?Vitals Value Taken Time  ?BP 111/56 10/15/21 1535  ?Temp    ?Pulse 70 10/15/21 1536  ?Resp 16 10/15/21 1536  ?SpO2 100 % 10/15/21 1536  ?Vitals shown include unvalidated device data. ? ?Last Pain:  ?Vitals:  ? 10/15/21 1046  ?TempSrc:   ?PainSc: 6   ?   ? ?Patients Stated Pain Goal: 0 (10/15/21 1046) ? ?Complications: No notable events documented. ?

## 2021-10-16 ENCOUNTER — Encounter (HOSPITAL_COMMUNITY): Payer: Self-pay | Admitting: Obstetrics & Gynecology

## 2021-10-17 LAB — SURGICAL PATHOLOGY

## 2021-10-17 LAB — CYTOLOGY - NON PAP

## 2021-10-24 ENCOUNTER — Encounter: Payer: Self-pay | Admitting: Family

## 2021-11-02 ENCOUNTER — Other Ambulatory Visit: Payer: Self-pay | Admitting: Family

## 2021-11-04 ENCOUNTER — Telehealth (HOSPITAL_COMMUNITY): Payer: Self-pay | Admitting: Pharmacy Technician

## 2021-11-04 NOTE — Telephone Encounter (Signed)
Patient Advocate Encounter   Received notification that prior authorization for Ubrelvy '100MG'$  tablets is required.   PA submitted on 11/04/2021 Key [id number] Status is pending       Lyndel Safe, [place] Patient Advocate Specialist [place] Patient Advocate Team Direct Number: [phone]  Fax: [phone]

## 2021-11-04 NOTE — Telephone Encounter (Signed)
Patient Advocate Encounter  Prior Authorization for Pamela Sanders '100MG'$  tablets has been approved.    PA# [id number] Effective dates: 11/04/2021 through 11/05/2022      Lyndel Safe, [place] Patient Advocate Specialist [place] Patient Advocate Team Direct Number: [phone]  Fax: [phone]

## 2021-11-07 ENCOUNTER — Ambulatory Visit (INDEPENDENT_AMBULATORY_CARE_PROVIDER_SITE_OTHER): Payer: PRIVATE HEALTH INSURANCE | Admitting: Gastroenterology

## 2021-11-07 ENCOUNTER — Encounter: Payer: Self-pay | Admitting: Gastroenterology

## 2021-11-07 VITALS — BP 106/74 | HR 79 | Ht 67.0 in | Wt 169.1 lb

## 2021-11-07 DIAGNOSIS — R109 Unspecified abdominal pain: Secondary | ICD-10-CM | POA: Diagnosis not present

## 2021-11-07 DIAGNOSIS — K5900 Constipation, unspecified: Secondary | ICD-10-CM | POA: Diagnosis not present

## 2021-11-07 DIAGNOSIS — R194 Change in bowel habit: Secondary | ICD-10-CM

## 2021-11-07 DIAGNOSIS — K219 Gastro-esophageal reflux disease without esophagitis: Secondary | ICD-10-CM

## 2021-11-07 DIAGNOSIS — R1032 Left lower quadrant pain: Secondary | ICD-10-CM

## 2021-11-07 MED ORDER — HYOSCYAMINE SULFATE 0.125 MG SL SUBL: 0.1250 mg | SUBLINGUAL_TABLET | SUBLINGUAL | 0 refills | Status: DC | PRN

## 2021-11-07 MED ORDER — CLENPIQ 10-3.5-12 MG-GM -GM/175ML PO SOLN: 1.0000 | Freq: Once | ORAL | 0 refills | Status: AC

## 2021-11-07 NOTE — Patient Instructions (Addendum)
If you are age 57 or younger, your body mass index should be between 19-25. Your Body mass index is 26.49 kg/m. If this is out of the aformentioned range listed, please consider follow up with your Primary Care Provider.   __________________________________________________________  The [place] [facility] providers would like to encourage you to use [facility] to communicate with providers for non-urgent requests or questions.  Due to long hold times on the telephone, sending your provider a message by [facility] may be a faster and more efficient way to get a response.  Please allow 48 business hours for a response.  Please remember that this is for non-urgent requests.    Due to recent changes in healthcare laws, you may see the results of your imaging and laboratory studies on MyChart before your provider has had a chance to review them.  We understand that in some cases there may be results that are confusing or concerning to you. Not all laboratory results come back in the same time frame and the provider may be waiting for multiple results in order to interpret others.  Please give [place] 48 hours in order for your provider to thoroughly review all the results before contacting the office for clarification of your results.   We have sent the following medications to your pharmacy for you to pick up at your convenience: [place] have been scheduled for a colonoscopy. Please follow written instructions given to you at your visit today.  Please pick up your prep supplies at the pharmacy within the next 1-3 days. If you use inhalers (even only as needed), please bring them with you on the day of your procedure.   Thank you for choosing me and [facility].  Vito Cirigliano, D.O.

## 2021-11-07 NOTE — Progress Notes (Signed)
Chief Complaint:    Constipation  GI History: 56 year old female with a history of diverticulosis, migraines, depression, cervicalgia, appendectomy 2007, cervical discectomy and fusion 2011, hemorrhoid surgery, ruptured ovarian cyst, history of adhesive disease, follows in the GI clinic for history of IBS and GERD.  Has a history of diverticulosis, but no prior history of diverticulitis.  - 2006: Evaluation at [facility] for the same sxs with reportedly unremarkable EGD and colonoscopy.  No reports available for review. - 02/2011: CT: Normal - 03/2012: CT: Mild dilation of small bowel loops with fecalization in distal ileum, moderate colonic stool - 07/2014: Colonoscopy: Descending diverticulosis, internal/external hemorrhoids.  Repeat 10 years - 02/2017: Abdominal ultrasound: Normal - 06/11/2021: Initial appointment in GI clinic with me.  Started low FODMAP diet, FD guard, and increased dietary fiber  HPI:     Patient is a 56 y.o. female presenting to the Gastroenterology Clinic for follow-up and continued evaluation of constipation with thin stools.  Last seen by me on 06/11/2021 for evaluation of multiple GI symptoms to include LLQ pain, nausea, decreased appetite, change in bowel habits.  Recommended low FODMAP diet, FD guard, consideration of IBgard.  Patient elected to hold off on Levsin, lipid, etc.  Reports not much improvement with low FODMAP diet and combination of IBGard and FDGard (indigestion when taking those?).   Recently underwent surgery for uterine left fundal pedunculated mass, fibroid with torsion and adhesions.  -10/12/2021: CT abdomen/pelvis: Normal liver, pancreas, spleen, GB.  Small hiatal hernia, sigmoid diverticulosis, otherwise normal GI tract.  Ovoid mass with intermediate attenuation in the left adnexa measuring 6.2 x 3 cm with adjacent soft tissue stranding. - 10/12/2021: Pelvic ultrasound: Complex echogenic mass in left adnexa measuring up to 5.1 cm -10/15/2021: Laparoscopy,  excision of uterine fundal mass, lysis of adhesions, bilateral salpingo-oophorectomy, pelvic washings.  Mass was adherent to sigmoid colon but with Hydro dissection sigmoid was separated from it and was rolled back into the mid abdomen.  Mass was also adherent to cul-de-sac and left ovarian fossa and left ovary, again separated with hydrodissection.  Uterine mass was then excised (path: Benign leiomyoma with hemorrhage and infarct).  Benign cytology  She reports only taking limited pain meds post op.   Since surgery, still with pencil thin stools and decreases stool volume.  This was present prior to surgery as well.  No hematochezia, melena. Can have intermittent lower abdominal cramping and pressure. Has trialed stool softener w/o much change. Then tried Senna prn with improvement. Does have high fiber diet with prn fiber supplement.    Review of systems:     No chest pain, no SOB, no fevers, no urinary sx   Past Medical History:  Diagnosis Date   Allergic rhinitis    Allergic rhinitis 03/07/2008   Qualifier: Diagnosis of  By: Redmond Pulling MD, Karren Cobble of this note might be different from the original. Overview:  Qualifier: Diagnosis of  By: Redmond Pulling MD, LauraLee   Chest pain 12/31/2019   Chest tightness 01/29/2020   Chronic inflammatory arthritis 03/07/2008   Qualifier: Diagnosis of  By: Redmond Pulling MD, Karren Cobble of this note might be different from the original. Overview:  Qualifier: Diagnosis of  By: Redmond Pulling MD, LauraLee   Depression    Depression (emotion)    DIVERTICULITIS, HX OF 03/07/2008   Qualifier: Diagnosis of  By: Redmond Pulling MD, LauraLee     DJD (degenerative joint disease)    chronic neck pain   General medical  examination 12/03/2010   GERD 03/07/2008   Qualifier: Diagnosis of  By: Redmond Pulling MD, Karren Cobble of this note might be different from the original. Overview:  Qualifier: Diagnosis of  By: Redmond Pulling MD, Frann Rider   GERD (gastroesophageal reflux disease)     Hip pain    History of diverticulitis of colon    Hx of diverticulitis of colon    Insomnia    Lateral epicondylitis of right elbow    Leukocytopenia    Migraine    Migraine without aura 03/07/2008   Qualifier: Diagnosis of  By: Redmond Pulling MD, LauraLee     Neck pain, chronic    NECK PAIN, CHRONIC 03/07/2008   Qualifier: Diagnosis of  By: Redmond Pulling MD, LauraLee     Osteoarthritis    Pain of left thumb    Pain of right sternoclavicular joint    Phantosmia    PONV (postoperative nausea and vomiting)    Routine general medical examination at a health care facility 08/09/2013   Sinusitis    Sternoclavicular joint pain, right 10/15/2019   TMJ (dislocation of temporomandibular joint)    Tremor of both hands 03/08/2020    Patient's surgical history, family medical history, social history, medications and allergies were all reviewed in Epic    Current Outpatient Medications  Medication Sig Dispense Refill   acetaminophen (TYLENOL) 500 MG tablet Take 1 tablet (500 mg total) by mouth every 6 (six) hours as needed. 30 tablet 0   b complex vitamins capsule Take 1 capsule by mouth 2 (two) times a week.     calcium citrate-vitamin D (CITRACAL+D) 315-200 MG-UNIT per tablet Take 2 tablets by mouth daily.     CAPSAICIN EX Apply 1 application. topically daily as needed (muscle pain).     cyclobenzaprine (FLEXERIL) 5 MG tablet Take 5 mg by mouth as needed for muscle spasms.     diphenhydrAMINE (BENADRYL) 25 MG tablet Take 25 mg by mouth daily as needed for itching or allergies.      famotidine (PEPCID AC) 10 MG chewable tablet Chew 10 mg by mouth daily as needed for heartburn.     gabapentin (NEURONTIN) 300 MG capsule Take 2 capsules by mouth twice daily 360 capsule 0   ibuprofen (ADVIL) 200 MG tablet Take 3 tablets (600 mg total) by mouth every 6 (six) hours as needed. 30 tablet 0   loratadine (CLARITIN) 10 MG tablet Take 10 mg by mouth daily.     Multiple Vitamins-Minerals (ONE-A-DAY WOMENS 50 PLUS)  TABS Take 1 tablet by mouth daily.     primidone (MYSOLINE) 50 MG tablet Take 1 tablet (50 mg total) by mouth nightly. 30 tablet 4   Psyllium (METAMUCIL PO) Take 0.5 fluid ounces by mouth daily.     SUMAtriptan (IMITREX) 50 MG tablet Take 1 tablet (50 mg total) by mouth every 2 (two) hours as needed for migraine. May repeat in 2 hours if headache persists or recurs. 10 tablet 0   Ubrogepant (UBRELVY) 100 MG TABS Take 100 mg by mouth as needed. 16 tablet 5   WELLBUTRIN XL 300 MG 24 hr tablet Take 1 tablet (300 mg total) by mouth daily. 90 tablet 1   zolpidem (AMBIEN) 5 MG tablet Take 1 tablet (5 mg total) by mouth at bedtime as needed for sleep 30 tablet 2   No current facility-administered medications for this visit.    Physical Exam:     BP 106/74   Pulse 79   Ht '5\' 7"'$  (  1.702 m)   Wt 169 lb 2 oz (76.7 kg)   BMI 26.49 kg/m   GENERAL:  Pleasant female in NAD PSYCH: : Cooperative, normal affect Musculoskeletal:  Normal muscle tone, normal strength NEURO: Alert and oriented x 3, no focal neurologic deficits   IMPRESSION and PLAN:    1) IBS 2) Lower abdominal cramping 3) Change in bowel habits Longstanding history of IBS, classically had been mixed type, but more recently seems to be constipation predominant.  Discussed potential overlap with adhesive disease, particularly of recent operative report, SCAD (although no history of diverticulitis), etc.  Given suboptimal response to appropriate trial of dietary modifications and conservative management, plan for the following:  - Colonoscopy to evaluate for mucosal/luminal pathology, stricture, tight angulation, etc.  Plan to schedule in 6+ weeks to allow for appropriate healing after recent surgery - Continue adequate hydration.  Currently drinking >64 oz water/day - Plan for extended 2-day bowel prep - Trial course of Levsin prn abdominal cramping - Continue high-fiber diet and fiber supplement for goal soft stools without straining  to BM  4) GERD - Continue Pepcid - Continue antireflux lifestyle/dietary modification     The indications, risks, and benefits of colonoscopy were explained to the patient in detail. Risks include but are not limited to bleeding, perforation, adverse reaction to medications, and cardiopulmonary compromise. Sequelae include but are not limited to the possibility of surgery, hospitalization, and mortality. The patient verbalized understanding and wished to proceed. All questions answered, referred to the scheduler and bowel prep ordered. Further recommendations pending results of the exam.    Lavena Bullion ,DO, FACG 11/07/2021, 10:55 AM

## 2021-11-11 ENCOUNTER — Encounter: Payer: Self-pay | Admitting: Gastroenterology

## 2021-11-11 ENCOUNTER — Other Ambulatory Visit: Payer: Self-pay

## 2021-11-11 DIAGNOSIS — R194 Change in bowel habit: Secondary | ICD-10-CM

## 2021-11-11 NOTE — Telephone Encounter (Signed)
Based on description, could be debris, mucosal slough, or possibly parasitic infection.  Can send for stool ova and parasite and treat with mebendazole 500 mg x 1, RF 0.

## 2021-11-11 NOTE — Telephone Encounter (Signed)
Lab order placed. Called pt to let her know recommendations. Pt verbalized understanding and had no other questions at end of call.

## 2021-11-12 NOTE — Addendum Note (Signed)
Addended by: Kelle Darting A on: 11/12/2021 10:28 AM   Modules accepted: Orders

## 2021-11-13 ENCOUNTER — Other Ambulatory Visit: Payer: PRIVATE HEALTH INSURANCE

## 2021-11-13 DIAGNOSIS — R194 Change in bowel habit: Secondary | ICD-10-CM

## 2021-11-19 LAB — OVA AND PARASITE EXAMINATION
CONCENTRATE RESULT:: NONE SEEN
MICRO NUMBER:: 13500753
SPECIMEN QUALITY:: ADEQUATE
TRICHROME RESULT:: NONE SEEN

## 2021-12-01 ENCOUNTER — Other Ambulatory Visit: Payer: Self-pay | Admitting: Neurology

## 2021-12-16 ENCOUNTER — Encounter: Payer: Self-pay | Admitting: Family

## 2022-01-01 ENCOUNTER — Encounter: Payer: Self-pay | Admitting: Gastroenterology

## 2022-01-05 ENCOUNTER — Encounter: Payer: PRIVATE HEALTH INSURANCE | Admitting: Family

## 2022-01-07 ENCOUNTER — Encounter: Payer: Self-pay | Admitting: Gastroenterology

## 2022-01-07 ENCOUNTER — Ambulatory Visit (AMBULATORY_SURGERY_CENTER): Payer: PRIVATE HEALTH INSURANCE | Admitting: Gastroenterology

## 2022-01-07 VITALS — BP 120/79 | HR 61 | Temp 98.6°F | Resp 17 | Ht 67.0 in | Wt 169.0 lb

## 2022-01-07 DIAGNOSIS — K641 Second degree hemorrhoids: Secondary | ICD-10-CM

## 2022-01-07 DIAGNOSIS — R194 Change in bowel habit: Secondary | ICD-10-CM

## 2022-01-07 DIAGNOSIS — K5900 Constipation, unspecified: Secondary | ICD-10-CM | POA: Diagnosis present

## 2022-01-07 DIAGNOSIS — R1030 Lower abdominal pain, unspecified: Secondary | ICD-10-CM | POA: Diagnosis not present

## 2022-01-07 DIAGNOSIS — K5730 Diverticulosis of large intestine without perforation or abscess without bleeding: Secondary | ICD-10-CM

## 2022-01-07 DIAGNOSIS — R109 Unspecified abdominal pain: Secondary | ICD-10-CM

## 2022-01-07 MED ORDER — SODIUM CHLORIDE 0.9 % IV SOLN: 500.0000 mL | Freq: Once | INTRAVENOUS | Status: DC

## 2022-01-07 NOTE — Progress Notes (Signed)
Pt's states no medical or surgical changes since previsit or office visit. 

## 2022-01-07 NOTE — Progress Notes (Signed)
GASTROENTEROLOGY PROCEDURE H&P NOTE   Primary Care Physician: [name], NP    Reason for Procedure:   Constipation, thin stools, lower abdominal pain  Plan:    Colonoscopy  Patient is appropriate for endoscopic procedure(s) in the ambulatory ([facility]) setting.  The nature of the procedure, as well as the risks, benefits, and alternatives were carefully and thoroughly reviewed with the patient. Ample time for discussion and questions allowed. The patient understood, was satisfied, and agreed to proceed.     HPI: Pamela Sanders is a 56 y.o. female who presents for colonsocopy for evaluation of change in bowel habits to include constipation, pencil thin stools, and lower abdominal pain.   Past Medical History:  Diagnosis Date   Allergic rhinitis    Allergic rhinitis 03/07/2008   Qualifier: Diagnosis of  By: Redmond Pulling MD, Karren Cobble of this note might be different from the original. Overview:  Qualifier: Diagnosis of  By: Redmond Pulling MD, LauraLee   Chest pain 12/31/2019   Chest tightness 01/29/2020   Chronic inflammatory arthritis 03/07/2008   Qualifier: Diagnosis of  By: Redmond Pulling MD, Karren Cobble of this note might be different from the original. Overview:  Qualifier: Diagnosis of  By: Redmond Pulling MD, LauraLee   Depression    Depression (emotion)    DIVERTICULITIS, HX OF 03/07/2008   Qualifier: Diagnosis of  By: Redmond Pulling MD, LauraLee     DJD (degenerative joint disease)    chronic neck pain   General medical examination 12/03/2010   GERD 03/07/2008   Qualifier: Diagnosis of  By: Redmond Pulling MD, Karren Cobble of this note might be different from the original. Overview:  Qualifier: Diagnosis of  By: Redmond Pulling MD, Frann Rider   GERD (gastroesophageal reflux disease)    Hip pain    History of diverticulitis of colon    Hx of diverticulitis of colon    Insomnia    Lateral epicondylitis of right elbow    Leukocytopenia    Migraine    Migraine without aura 03/07/2008    Qualifier: Diagnosis of  By: Redmond Pulling MD, LauraLee     Neck pain, chronic    NECK PAIN, CHRONIC 03/07/2008   Qualifier: Diagnosis of  By: Redmond Pulling MD, LauraLee     Osteoarthritis    Pain of left thumb    Pain of right sternoclavicular joint    Phantosmia    PONV (postoperative nausea and vomiting)    Routine general medical examination at a health care facility 08/09/2013   Sinusitis    Sternoclavicular joint pain, right 10/15/2019   TMJ (dislocation of temporomandibular joint)    Tremor of both hands 03/08/2020    Past Surgical History:  Procedure Laterality Date   APPENDECTOMY     CERVICAL ABLATION  05/2021   CERVICAL DISCECTOMY     Dr. Harl Bowie 11/2009- [facility]   COLONOSCOPY  08/03/2014   ESOPHAGOGASTRODUODENOSCOPY     Says it was normal but maybe a peptic ulcer. Early 2000's possibly [facility]   HEMORROIDECTOMY     LAPAROSCOPIC BILATERAL SALPINGO OOPHERECTOMY N/A 10/15/2021   Procedure: LAPAROSCOPIC BILATERAL SALPINGOOPHERECTOMY AND REMOVAL OF UTERINE MASS/PELVIC WASHINGS;  Surgeon: Azucena Fallen, MD;  Location: [facility];  Service: Gynecology;  Laterality: N/A;   OVARIAN CYST SURGERY     abdominal surgery for ovarian cyst   TISSUE GRAFT  02/07/2015   pt reports gum graft for receeding gums--Dr Geralynn Ochs   TMJ ARTHROSCOPY     TMJ surgery    Prior  to Admission medications   Medication Sig Start Date End Date Taking? Authorizing Provider  famotidine (PEPCID AC) 10 MG chewable tablet Chew 10 mg by mouth daily as needed for heartburn.   Yes [provider]  gabapentin (NEURONTIN) 300 MG capsule Take 2 capsules by mouth twice daily 11/03/21  Yes [name], NP  loratadine (CLARITIN) 10 MG tablet Take 10 mg by mouth daily.   Yes [provider]  WELLBUTRIN XL 300 MG 24 hr tablet Take 1 tablet (300 mg total) by mouth daily. 08/12/21  Yes [name], NP  acetaminophen (TYLENOL) 500 MG tablet Take 1 tablet (500 mg total) by mouth every 6 (six) hours as needed.  10/15/21   Azucena Fallen, MD  b complex vitamins capsule Take 1 capsule by mouth 2 (two) times a week.    [provider]  calcium citrate-vitamin D (CITRACAL+D) 315-200 MG-UNIT per tablet Take 2 tablets by mouth daily.    [provider]  CAPSAICIN EX Apply 1 application. topically daily as needed (muscle pain).    [provider]  cyclobenzaprine (FLEXERIL) 5 MG tablet Take 5 mg by mouth as needed for muscle spasms.    [provider]  diphenhydrAMINE (BENADRYL) 25 MG tablet Take 25 mg by mouth daily as needed for itching or allergies.     [provider]  hyoscyamine (LEVSIN SL) 0.125 MG SL tablet Place 1 tablet (0.125 mg total) under the tongue every 4 (four) hours as needed. 11/07/21   Alicya Bena V, DO  ibuprofen (ADVIL) 200 MG tablet Take 3 tablets (600 mg total) by mouth every 6 (six) hours as needed. 10/15/21   Azucena Fallen, MD  Multiple Vitamins-Minerals (ONE-A-DAY WOMENS 50 PLUS) TABS Take 1 tablet by mouth daily. 06/10/20   [name], NP  primidone (MYSOLINE) 50 MG tablet Take 1 tablet (50 mg total) by mouth nightly. 12/03/21   Pieter Partridge, DO  Psyllium (METAMUCIL PO) Take 0.5 fluid ounces by mouth daily.    [provider]  SUMAtriptan (IMITREX) 50 MG tablet Take 1 tablet (50 mg total) by mouth every 2 (two) hours as needed for migraine. May repeat in 2 hours if headache persists or recurs. 01/22/21   [name], NP  Ubrogepant (UBRELVY) 100 MG TABS Take 100 mg by mouth as needed. 08/27/21   Pieter Partridge, DO  zolpidem (AMBIEN) 5 MG tablet Take 1 tablet (5 mg total) by mouth at bedtime as needed for sleep 01/27/21   [name], NP    Current Outpatient Medications  Medication Sig Dispense Refill   famotidine (PEPCID AC) 10 MG chewable tablet Chew 10 mg by mouth daily as needed for heartburn.     gabapentin (NEURONTIN) 300 MG capsule Take 2 capsules by mouth twice daily 360 capsule 0   loratadine  (CLARITIN) 10 MG tablet Take 10 mg by mouth daily.     WELLBUTRIN XL 300 MG 24 hr tablet Take 1 tablet (300 mg total) by mouth daily. 90 tablet 1   acetaminophen (TYLENOL) 500 MG tablet Take 1 tablet (500 mg total) by mouth every 6 (six) hours as needed. 30 tablet 0   b complex vitamins capsule Take 1 capsule by mouth 2 (two) times a week.     calcium citrate-vitamin D (CITRACAL+D) 315-200 MG-UNIT per tablet Take 2 tablets by mouth daily.     CAPSAICIN EX Apply 1 application. topically daily as needed (muscle pain).     cyclobenzaprine (FLEXERIL) 5 MG tablet Take  5 mg by mouth as needed for muscle spasms.     diphenhydrAMINE (BENADRYL) 25 MG tablet Take 25 mg by mouth daily as needed for itching or allergies.      hyoscyamine (LEVSIN SL) 0.125 MG SL tablet Place 1 tablet (0.125 mg total) under the tongue every 4 (four) hours as needed. 30 tablet 0   ibuprofen (ADVIL) 200 MG tablet Take 3 tablets (600 mg total) by mouth every 6 (six) hours as needed. 30 tablet 0   Multiple Vitamins-Minerals (ONE-A-DAY WOMENS 50 PLUS) TABS Take 1 tablet by mouth daily.     primidone (MYSOLINE) 50 MG tablet Take 1 tablet (50 mg total) by mouth nightly. 30 tablet 2   Psyllium (METAMUCIL PO) Take 0.5 fluid ounces by mouth daily.     SUMAtriptan (IMITREX) 50 MG tablet Take 1 tablet (50 mg total) by mouth every 2 (two) hours as needed for migraine. May repeat in 2 hours if headache persists or recurs. 10 tablet 0   Ubrogepant (UBRELVY) 100 MG TABS Take 100 mg by mouth as needed. 16 tablet 5   zolpidem (AMBIEN) 5 MG tablet Take 1 tablet (5 mg total) by mouth at bedtime as needed for sleep 30 tablet 2   Current Facility-Administered Medications  Medication Dose Route Frequency Provider Last Rate Last Admin   0.9 %  sodium chloride infusion  500 mL Intravenous Once Najeh Credit V, DO        Allergies as of 01/07/2022 - Review Complete 01/07/2022  Allergen Reaction Noted   Penicillins Anaphylaxis 03/07/2008    Celebrex [celecoxib]  03/20/2020   Shingrix [zoster vac recomb adjuvanted]  03/24/2018    Family History  Problem Relation Age of Onset   Arthritis Other    Breast cancer Other    Coronary artery disease Maternal Grandmother    Breast cancer Maternal Grandmother        carcinoid tumor in abd   Hypertension Other    Diverticulitis Mother    Arthritis Mother    Pancreatic cancer Other     Social History   Socioeconomic History   Marital status: Married    Spouse name: [name]   Number of children: 0   Years of education: Not on file   Highest education level: Doctorate  Occupational History   Occupation: Probation officer: [employer]  Tobacco Use   Smoking status: Never   Smokeless tobacco: Never  Vaping Use   Vaping Use: Never used  Substance and Sexual Activity   Alcohol use: Yes    Alcohol/week: 0.0 standard drinks of alcohol    Comment: 2 drinks weekly, wine   Drug use: No   Sexual activity: Not on file  Other Topics Concern   Not on file  Social History Narrative   Domestic Partner   no children   She grew up in [place], [place]    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 She lives with her wife in a 2 story home. She drinks 1-2 cups of coffee a day and 1-2 glasses of tea. She exercises regularly.   Social Determinants of Health   Financial Resource Strain: Not on file  Food Insecurity: Not on file  Transportation Needs: Not on file  Physical Activity: Not on file  Stress: Not on file  Social Connections: Not on file  Intimate Partner Violence: Not on file    Physical Exam: Vital signs in last 24 hours: '@BP'$  113/68   Pulse 78   Temp 98.6 F (37 C)   Ht '5\' 7"'$  (1.702 m)   Wt 169 lb (76.7 kg)   SpO2 100%   BMI 26.47 kg/m  GEN: NAD EYE: Sclerae anicteric ENT: MMM CV:  Non-tachycardic Pulm: CTA b/l GI: Soft, NT/ND NEURO:  Alert & Oriented x 3   Gerrit Heck, DO [facility]   01/07/2022 9:19 AM

## 2022-01-07 NOTE — Progress Notes (Signed)
Report to PACU, RN, vss, BBS= Clear.  

## 2022-01-07 NOTE — Patient Instructions (Signed)
Please read handouts provided. Continue present medications. Return to GI office as needed. Repeat colonoscopy in 10 years for screening. Consider a fiber supplement.   YOU HAD AN ENDOSCOPIC PROCEDURE TODAY AT [facility]:   Refer to the procedure report that was given to you for any specific questions about what was found during the examination.  If the procedure report does not answer your questions, please call your gastroenterologist to clarify.  If you requested that your care partner not be given the details of your procedure findings, then the procedure report has been included in a sealed envelope for you to review at your convenience later.  YOU SHOULD EXPECT: Some feelings of bloating in the abdomen. Passage of more gas than usual.  Walking can help get rid of the air that was put into your GI tract during the procedure and reduce the bloating. If you had a lower endoscopy (such as a colonoscopy or flexible sigmoidoscopy) you may notice spotting of blood in your stool or on the toilet paper. If you underwent a bowel prep for your procedure, you may not have a normal bowel movement for a few days.  Please Note:  You might notice some irritation and congestion in your nose or some drainage.  This is from the oxygen used during your procedure.  There is no need for concern and it should clear up in a day or so.  SYMPTOMS TO REPORT IMMEDIATELY:  Following lower endoscopy (colonoscopy or flexible sigmoidoscopy):  Excessive amounts of blood in the stool  Significant tenderness or worsening of abdominal pains  Swelling of the abdomen that is new, acute  Fever of 100F or higher   For urgent or emergent issues, a gastroenterologist can be reached at any hour by calling [phone]. Do not use MyChart messaging for urgent concerns.    DIET:  We do recommend a small meal at first, but then you may proceed to your regular diet.  Drink plenty of fluids but you should avoid  alcoholic beverages for 24 hours.  ACTIVITY:  You should plan to take it easy for the rest of today and you should NOT DRIVE or use heavy machinery until tomorrow (because of the sedation medicines used during the test).    FOLLOW UP: Our staff will call the number listed on your records the next business day following your procedure.  We will call around 7:15- 8:00 am to check on you and address any questions or concerns that you may have regarding the information given to you following your procedure. If we do not reach you, we will leave a message.  If you develop any symptoms (ie: fever, flu-like symptoms, shortness of breath, cough etc.) before then, please call [phone].  If you test positive for Covid 19 in the 2 weeks post procedure, please call and report this information to [place].    If any biopsies were taken you will be contacted by phone or by letter within the next 1-3 weeks.  Please call us at [phone] if you have not heard about the biopsies in 3 weeks.    SIGNATURES/CONFIDENTIALITY: You and/or your care partner have signed paperwork which will be entered into your electronic medical record.  These signatures attest to the fact that that the information above on your After Visit Summary has been reviewed and is understood.  Full responsibility of the confidentiality of this discharge information lies with you and/or your care-partner.

## 2022-01-07 NOTE — Op Note (Signed)
[facility] Patient Name: Pamela Sanders Procedure Date: 01/07/2022 9:14 AM MRN: [id number] Endoscopist: Gerrit Heck , MD Age: 56 Referring MD:  Date of Birth: 26-Aug-1965 Gender: Female Account #: [id number] Procedure:                Colonoscopy Indications:              Lower abdominal pain, Change in bowel habits,                            Change in stool caliber, Constipation Medicines:                Monitored Anesthesia Care Procedure:                Pre-Anesthesia Assessment:                           - Prior to the procedure, a History and Physical                            was performed, and patient medications and                            allergies were reviewed. The patient's tolerance of                            previous anesthesia was also reviewed. The risks                            and benefits of the procedure and the sedation                            options and risks were discussed with the patient.                            All questions were answered, and informed consent                            was obtained. Prior Anticoagulants: The patient has                            taken no previous anticoagulant or antiplatelet                            agents. ASA Grade Assessment: II - A patient with                            mild systemic disease. After reviewing the risks                            and benefits, the patient was deemed in                            satisfactory condition to undergo the procedure.  After obtaining informed consent, the colonoscope                            was passed under direct vision. Throughout the                            procedure, the patient's blood pressure, pulse, and                            oxygen saturations were monitored continuously. The                            PCF-HQ190L Colonoscope was introduced through the                            anus and advanced to the  the terminal ileum. The                            colonoscopy was performed without difficulty. The                            patient tolerated the procedure well. The quality                            of the bowel preparation was good. The terminal                            ileum, ileocecal valve, appendiceal orifice, and                            rectum were photographed. Scope In: 9:26:35 AM Scope Out: 9:39:27 AM Scope Withdrawal Time: 0 hours 10 minutes 0 seconds  Total Procedure Duration: 0 hours 12 minutes 52 seconds  Findings:                 Hemorrhoids were found on perianal exam.                           A few small-mouthed diverticula were found in the                            sigmoid colon.                           Non-bleeding internal hemorrhoids were found during                            retroflexion. The hemorrhoids were small and Grade                            II (internal hemorrhoids that prolapse but reduce                            spontaneously).  The exam was otherwise normal throughout the                            remainder of the colon.                           The terminal ileum appeared normal. Complications:            No immediate complications. Estimated Blood Loss:     Estimated blood loss: none. Impression:               - Hemorrhoids found on perianal exam.                           - Diverticulosis in the sigmoid colon.                           - Non-bleeding internal hemorrhoids.                           - The examined portion of the ileum was normal.                           - No specimens collected. Recommendation:           - Patient has a contact number available for                            emergencies. The signs and symptoms of potential                            delayed complications were discussed with the                            patient. Return to normal activities tomorrow.                             Written discharge instructions were provided to the                            patient.                           - Resume previous diet.                           - Continue present medications.                           - Use fiber, for example Citrucel, Fibercon, Konsyl                            or Metamucil.                           - Repeat colonoscopy in 10 years for screening  purposes.                           - Return to GI office PRN.                           - Internal hemorrhoids were noted on this study and                            may be amenable to hemorrhoid band ligation. If you                            are interested in further treatment of these                            hemorrhoids with band ligation, please contact my                            clinic to set up an appointment for evaluation and                            treatment. Gerrit Heck, MD 01/07/2022 9:49:32 AM

## 2022-01-08 ENCOUNTER — Telehealth: Payer: Self-pay

## 2022-01-08 NOTE — Telephone Encounter (Signed)
  Follow up Call-     01/07/2022    8:34 AM  Call back number  Post procedure Call Back phone  # [phone]  Permission to leave phone message Yes     Patient questions:  Do you have a fever, pain , or abdominal swelling? No. Pain Score  0 *  Have you tolerated food without any problems? Yes.    Have you been able to return to your normal activities? Yes.    Do you have any questions about your discharge instructions: Diet   No. Medications  No. Follow up visit  No.  Do you have questions or concerns about your Care? No.  Actions: * If pain score is 4 or above: No action needed, pain <4.

## 2022-01-12 ENCOUNTER — Encounter: Payer: Self-pay | Admitting: Family

## 2022-01-13 ENCOUNTER — Ambulatory Visit: Payer: PRIVATE HEALTH INSURANCE | Admitting: Family

## 2022-01-13 VITALS — BP 119/86 | HR 71 | Temp 98.2°F | Resp 16 | Wt 172.0 lb

## 2022-01-13 DIAGNOSIS — J329 Chronic sinusitis, unspecified: Secondary | ICD-10-CM | POA: Diagnosis not present

## 2022-01-13 MED ORDER — AZITHROMYCIN 250 MG PO TABS: ORAL_TABLET | ORAL | 0 refills | Status: AC

## 2022-01-13 NOTE — Progress Notes (Signed)
Subjective:   By signing my name below, I, Pamela Sanders, attest that this documentation has been prepared under the direction and in the presence of Karie Chimera, NP 01/13/2022     Patient ID: Pamela Sanders, female    DOB: [birth year], 56 y.o.   MRN: [id number]  No chief complaint on file.   HPI Patient is in today for an office visit   Headache/Ear Fullness: She complains of a headache (sinus related) and ear fullness. She reports that her headache symptoms begun about two weeks ago. Her ear fullness appeared on 01/11/2022. She has been taking OTC medications for her headache and states that medications did not improve. She states that Flonase gives her headaches. She denies of any fever or abnormal fatigue.    Health Maintenance Due  Topic Date Due  . COVID-19 Vaccine (6 - Booster for [place] series) 04/05/2021  . PAP SMEAR-Modifier  06/28/2021  . MAMMOGRAM  09/18/2021  . INFLUENZA VACCINE  01/06/2022    Past Medical History:  Diagnosis Date  . Allergic rhinitis   . Allergic rhinitis 03/07/2008   Qualifier: Diagnosis of  By: Redmond Pulling MD, Karren Cobble of this note might be different from the original. Overview:  Qualifier: Diagnosis of  By: Redmond Pulling MD, Frann Rider  . Chest pain 12/31/2019  . Chest tightness 01/29/2020  . Chronic inflammatory arthritis 03/07/2008   Qualifier: Diagnosis of  By: Redmond Pulling MD, Karren Cobble of this note might be different from the original. Overview:  Qualifier: Diagnosis of  By: Redmond Pulling MD, Frann Rider  . Depression   . Depression (emotion)   . DIVERTICULITIS, HX OF 03/07/2008   Qualifier: Diagnosis of  By: Redmond Pulling MD, Frann Rider    . DJD (degenerative joint disease)    chronic neck pain  . General medical examination 12/03/2010  . GERD 03/07/2008   Qualifier: Diagnosis of  By: Redmond Pulling MD, Karren Cobble of this note might be different from the original. Overview:  Qualifier: Diagnosis of  By: Redmond Pulling MD, Frann Rider  . GERD  (gastroesophageal reflux disease)   . Hip pain   . History of diverticulitis of colon   . Hx of diverticulitis of colon   . Insomnia   . Lateral epicondylitis of right elbow   . Leukocytopenia   . Migraine   . Migraine without aura 03/07/2008   Qualifier: Diagnosis of  By: Redmond Pulling MD, Frann Rider    . Neck pain, chronic   . NECK PAIN, CHRONIC 03/07/2008   Qualifier: Diagnosis of  By: Redmond Pulling MD, Frann Rider    . Osteoarthritis   . Pain of left thumb   . Pain of right sternoclavicular joint   . Phantosmia   . PONV (postoperative nausea and vomiting)   . Routine general medical examination at a health care facility 08/09/2013  . Sinusitis   . Sternoclavicular joint pain, right 10/15/2019  . TMJ (dislocation of temporomandibular joint)   . Tremor of both hands 03/08/2020    Past Surgical History:  Procedure Laterality Date  . APPENDECTOMY    . CERVICAL ABLATION  05/2021  . CERVICAL DISCECTOMY     Dr. Harl [facility]2011[facility]  . COLONOSCOPY  08/03/2014  . ESOPHAGOGASTRODUODENOSCOPY     Says it was normal but maybe a peptic ulcer. Early 2000's possibly [facility]  . HEMORROIDECTOMY    . LAPAROSCOPIC BILATERAL SALPINGO OOPHERECTOMY N/A 10/15/2021   Procedure: LAPAROSCOPIC BILATERAL SALPINGOOPHERECTOMY AND REMOVAL OF UTERINE MASS/PELVIC WASHINGS;  Surgeon: Azucena Fallen, MD;  Location: [facility] OR;  Service: Gynecology;  Laterality: N/A;  . OVARIAN CYST SURGERY     abdominal surgery for ovarian cyst  . TISSUE GRAFT  02/07/2015   pt reports gum graft for receeding gums--Dr Geralynn Ochs  . TMJ ARTHROSCOPY     TMJ surgery    Family History  Problem Relation Age of Onset  . Arthritis Other   . Breast cancer Other   . Coronary artery disease Maternal Grandmother   . Breast cancer Maternal Grandmother        carcinoid tumor in abd  . Hypertension Other   . Diverticulitis Mother   . Arthritis Mother   . Pancreatic cancer Other     Social History   Socioeconomic History  . Marital status: Married     Spouse name: [name]  . Number of children: 0  . Years of education: Not on file  . Highest education level: Doctorate  Occupational History  . Occupation: Probation officer: [employer]  Tobacco Use  . Smoking status: Never  . Smokeless tobacco: Never  Vaping Use  . Vaping Use: Never used  Substance and Sexual Activity  . Alcohol use: Yes    Alcohol/week: 0.0 standard drinks of alcohol    Comment: 2 drinks weekly, wine  . Drug use: No  . Sexual activity: Not on file  Other Topics Concern  . 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 She lives with her wife in a 2 story home. She drinks 1-2 cups of coffee a day and 1-2 glasses of tea. She exercises regularly.   Social Determinants of Health   Financial Resource Strain: Not on file  Food Insecurity: Not on file  Transportation Needs: Not on file  Physical Activity: Not on file  Stress: Not on file  Social Connections: Not on file  Intimate Partner Violence: Not on file    Outpatient Medications Prior to Visit  Medication Sig Dispense Refill  . acetaminophen (TYLENOL) 500 MG tablet Take 1 tablet (500 mg total) by mouth every 6 (six) hours as needed. 30 tablet 0  . b complex vitamins capsule Take 1 capsule by mouth 2 (two) times a week.    . calcium citrate-vitamin D (CITRACAL+D) 315-200 MG-UNIT per tablet Take 2 tablets by mouth daily.    Marland Kitchen CAPSAICIN EX Apply 1 application. topically daily as needed (muscle pain).    . cyclobenzaprine (FLEXERIL) 5 MG tablet Take 5 mg by mouth as needed for muscle spasms.    . diphenhydrAMINE (BENADRYL) 25 MG tablet Take 25 mg by mouth daily as needed for itching or allergies.     . famotidine (PEPCID AC) 10  MG chewable tablet Chew 10 mg by mouth daily as needed for heartburn.    . gabapentin (NEURONTIN) 300 MG capsule Take 2 capsules by mouth twice daily 360 capsule 0  . hyoscyamine (LEVSIN SL) 0.125 MG SL tablet Place 1 tablet (0.125 mg total) under the tongue every 4 (four) hours as needed. 30 tablet 0  . ibuprofen (ADVIL) 200 MG tablet Take 3 tablets (600 mg total) by mouth  every 6 (six) hours as needed. 30 tablet 0  . loratadine (CLARITIN) 10 MG tablet Take 10 mg by mouth daily.    . Multiple Vitamins-Minerals (ONE-A-DAY WOMENS 50 PLUS) TABS Take 1 tablet by mouth daily.    . primidone (MYSOLINE) 50 MG tablet Take 1 tablet (50 mg total) by mouth nightly. 30 tablet 2  . Psyllium (METAMUCIL PO) Take 0.5 fluid ounces by mouth daily.    . SUMAtriptan (IMITREX) 50 MG tablet Take 1 tablet (50 mg total) by mouth every 2 (two) hours as needed for migraine. May repeat in 2 hours if headache persists or recurs. 10 tablet 0  . Ubrogepant (UBRELVY) 100 MG TABS Take 100 mg by mouth as needed. 16 tablet 5  . WELLBUTRIN XL 300 MG 24 hr tablet Take 1 tablet (300 mg total) by mouth daily. 90 tablet 1  . zolpidem (AMBIEN) 5 MG tablet Take 1 tablet (5 mg total) by mouth at bedtime as needed for sleep 30 tablet 2   No facility-administered medications prior to visit.    Allergies  Allergen Reactions  . Penicillins Anaphylaxis  . Celebrex [Celecoxib]     Headaches  . Shingrix [Zoster Vac Recomb Adjuvanted]     Fever, myalgia, local redness    Review of Systems  Constitutional:  Negative for fever and malaise/fatigue.  HENT:         (+)  Ear Fullness  Neurological:  Positive for headaches.       Objective:    Physical Exam Constitutional:      General: She is not in acute distress.    Appearance: Normal appearance. She is not ill-appearing.  HENT:     Head: Normocephalic and atraumatic.     Comments: Frontal and Maxillary sinus tenderness bilaterally.      Right Ear: Tympanic membrane, ear  canal and external ear normal.     Left Ear: Tympanic membrane, ear canal and external ear normal.     Ears:     Comments: Clear fluid behind eardrum (right)  Eyes:     Extraocular Movements: Extraocular movements intact.     Pupils: Pupils are equal, round, and reactive to light.  Cardiovascular:     Rate and Rhythm: Normal rate and regular rhythm.     Heart sounds: Normal heart sounds. No murmur heard.    No gallop.  Pulmonary:     Effort: Pulmonary effort is normal. No respiratory distress.     Breath sounds: Normal breath sounds. No wheezing or rales.  Skin:    General: Skin is warm and dry.  Neurological:     Mental Status: She is alert and oriented to person, place, and time.  Psychiatric:        Mood and Affect: Mood normal.        Behavior: Behavior normal.        Judgment: Judgment normal.    There were no vitals taken for this visit. Wt Readings from Last 3 Encounters:  01/07/22 169 lb (76.7 kg)  11/07/21 169 lb 2 oz (76.7 kg)  10/15/21 169 lb (76.7 kg)       Assessment & Plan:   Problem List Items Addressed This Visit   None  No orders of the defined types were placed in this encounter.   I, Pamela Sanders, personally preformed the services described in this documentation.  All medical record entries made by the scribe were at my direction and in my presence.  I have reviewed the chart  and discharge instructions (if applicable) and agree that the record reflects my personal performance and is accurate and complete. 01/13/2022   I,Amber Collins,acting as a scribe for Nance Pear, NP.,have documented all relevant documentation on the behalf of Nance Pear, NP,as directed by  Nance Pear, NP while in the presence of Nance Pear, NP.    [employer]

## 2022-01-13 NOTE — Patient Instructions (Signed)
Please start azithromycin for sinus infection.

## 2022-01-14 NOTE — Assessment & Plan Note (Signed)
New.  Pen Allergic. Will rx with azithromycin. Continue current supportive measures. Call if symptoms worsen or if symptoms do not improve.

## 2022-01-26 ENCOUNTER — Other Ambulatory Visit: Payer: Self-pay | Admitting: Family

## 2022-01-26 ENCOUNTER — Encounter: Payer: Self-pay | Admitting: Family

## 2022-01-26 NOTE — Progress Notes (Unsigned)
NEUROLOGY FOLLOW UP OFFICE NOTE  Areatha Kalata [id number]  Assessment/Plan:   Tension-type headache, likely aggravated by increase stressors and suboptimal sleep Essential tremor Migraine with and without aura, without status migrainosus, intractable   1  To treat tension type headache, she will address stress management, optimize sleep hygiene and start exercise.  If headaches persist, start tizanidine titrating to '4mg'$  three times daily. 2  Primidone '50mg'$  at bedtime for ET 3  Ubrelvy '100mg'$  at the earliest onset next time she has a migraine 4  Follow up 4 months.  Subjective:  Fanny Agan is a 56 year old right-handed female with degenerative joint disease with chronic neck pain s/p ACDF C5-C6 (2011) follows up for essential tremor.     UPDATE: Tremor: Currently taking primidone '50mg'$  at bedtime Controlled.  Able to work in the lab.  However, primdone makes her sleepy.   Migraines: Migraines are mild but responds to [place].  She has been experiencing increased headaches but different than her migraines.  They are mild-moderate frontal/occipital pressure headache with neck tension.  It was daily.  Usually will occur for several days in row and then resolve.  Roselyn Meier was ineffective.  Will treat with Tylenol or ibuprofen daily at that time.  Sometimes Flexeril.  She was been diagnosed with sinusitis this month and was prescribed a Z pack which helped.  Does report increased work-related stress.  Sleep varies.  Eye exam was okay.   Current NSAIDS:  Ibuprofen (for tension-type headache) Current analgesics:  Tylenol (for tension-type headache) Current triptans:  none Current ergotamine:  no Current anti-emetic:  no Current muscle relaxants:  no Current sleep aide:  Ambien Current Antihypertensive medications:  no Current Antidepressant medications:  Wellbutrin XL '150mg'$  daily Current Anticonvulsant medications:  Gabapentin '600mg'$  twice daily, primidone '50mg'$  at bedtime (ET) Current  anti-CGRP:  Ubrelvy '100mg'$  Current Vitamins/Herbal/Supplements:  no Current Antihistamines/Decongestants: Benadryl, Claritin Other therapy:  no Other medications:  Ambien   HISTORY: She started noticing tremor in 2021.  It is bilateral, but worse on the right.  It occurs when hands are with use or outstretched.  It does not occur at rest.  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  Her hands will shake.  No trouble swallowing.  No trouble with walking.  No history of REM sleep behavior disorder.  She does occasionally drink wine but has not noticed if symptoms subside when drinking.  She reports that her great grandmother had Parkinson's disease but no known family history of essential tremor.   Works in a lab papet tremors fluctuates right worse than left - over a year -    [place] had PD.  No family history of tremors    Migraines/Tension-type headache/chronic neck pain: Onset:  1999 Location:  Left or right frontal/retro-orbital, chronic left sided neck pain.  She has constant pain in left jaw (history of jaw surgery for TMJ.  She uses a bite guard for bruxism.   Quality:  Stabbing/throbbing Intensity:  Moderate to severe.  She denies new headache, thunderclap headache or severe headache that wakes her from sleep. Aura:  Sometimes "bluish vision".  One time had tunnel vision in 2005. Prodrome:  no Postdrome:  no Associated symptoms:  Photophobia, phonophobia.  Sometimes tinnitus.  She denies associated nausea, vomiting, visual disturbance, autonomic symptoms or unilateral numbness or weakness. Duration:  1/2 to 1 day  Frequency:  Once every 5 to 6 months Frequency of abortive medication: 2 days a week Triggers/exacerbating factors:  Emotional stress, skipped meals, sleep deprivation Relieving factors:  Laying down in dark and quiet room Activity:  Avoids   She also has  tension-type headaches in the back of her head radiating into the neck as well as headaches at the bridge of her nose.  They last a day.  They occur 2 days a week.     MRI of brain with and without contrast (01/10/13):  "1.  No acute intracranial abnormality.  2.  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  C5-C6 ACDF with anterior plate and screw fixation and complete graft incorporation.  No motion with flexion/extension to suggest pseudoarthrosis.  2.  Mild C4-C5 and C6-C7 degenerative disc disease.  Mild bilateral C7-T1 facet arthropathy.  3.  No acute fracture or prevertebral swelling.  4.  Multiple mandibular screws bilaterally."   Past NSAIDS:  etodolac Past analgesics:  no Past abortive triptans:  Sumatriptan (causes nausea) Past abortive ergotamine:  no Past muscle relaxants:  Tizanidine '4mg'$  (helped) Past anti-emetic:  Compazine (used for sumatriptan-induced nausea) Past antihypertensive medications:  no Past antidepressant medications:  Nortriptyline (did not tolerate) Past anticonvulsant medications:  Topiramate (disoriented), Keppra Past anti-CGRP:  no Past vitamins/Herbal/Supplements:  no Past antihistamines/decongestants:  no Other past therapies:  Botox (effective but caused welts and itching), massage, caffeine  PAST MEDICAL HISTORY: Past Medical History:  Diagnosis Date   Allergic rhinitis    Allergic rhinitis 03/07/2008   Qualifier: Diagnosis of  By: Redmond Pulling MD, Karren Cobble of this note might be different from the original. Overview:  Qualifier: Diagnosis of  By: Redmond Pulling MD, LauraLee   Chest pain 12/31/2019   Chest tightness 01/29/2020   Chronic inflammatory arthritis 03/07/2008   Qualifier: Diagnosis of  By: Redmond Pulling MD, Karren Cobble of this note might be different from the original. Overview:  Qualifier: Diagnosis of  By: Redmond Pulling MD, LauraLee   Depression    Depression (emotion)     DIVERTICULITIS, HX OF 03/07/2008   Qualifier: Diagnosis of  By: Redmond Pulling MD, LauraLee     DJD (degenerative joint disease)    chronic neck pain   General medical examination 12/03/2010   GERD 03/07/2008   Qualifier: Diagnosis of  By: Redmond Pulling MD, Karren Cobble of this note might be different from the original. Overview:  Qualifier: Diagnosis of  By: Redmond Pulling MD, Frann Rider   GERD (gastroesophageal reflux disease)    Hip pain    History of diverticulitis of colon    Hx of diverticulitis of colon    Insomnia    Lateral epicondylitis of right elbow    Leukocytopenia    Migraine    Migraine without aura 03/07/2008   Qualifier: Diagnosis of  By: Redmond Pulling MD, LauraLee     Neck pain, chronic    NECK PAIN, CHRONIC 03/07/2008   Qualifier: Diagnosis of  By: Redmond Pulling MD, LauraLee     Osteoarthritis    Pain of left thumb    Pain of right sternoclavicular joint    Phantosmia    PONV (postoperative nausea and vomiting)    Routine general medical examination at a health care facility 08/09/2013   Sinusitis    Sternoclavicular joint pain, right 10/15/2019   TMJ (dislocation of temporomandibular joint)    Tremor of both hands 03/08/2020    MEDICATIONS: Current  Outpatient Medications on File Prior to Visit  Medication Sig Dispense Refill   acetaminophen (TYLENOL) 325 MG tablet Take 650 mg by mouth every 6 (six) hours as needed.     b complex vitamins capsule Take 1 capsule by mouth 2 (two) times a week.     calcium citrate-vitamin D (CITRACAL+D) 315-200 MG-UNIT per tablet Take 2 tablets by mouth daily.     CAPSAICIN EX Apply 1 application. topically daily as needed (muscle pain).     cyclobenzaprine (FLEXERIL) 5 MG tablet Take 5 mg by mouth as needed for muscle spasms.     diphenhydrAMINE (BENADRYL) 25 MG tablet Take 25 mg by mouth daily as needed for itching or allergies.      famotidine (PEPCID AC) 10 MG chewable tablet Chew 10 mg by mouth daily as needed for heartburn.     gabapentin  (NEURONTIN) 300 MG capsule Take 2 capsules by mouth twice daily 360 capsule 0   hyoscyamine (LEVSIN SL) 0.125 MG SL tablet Place 1 tablet (0.125 mg total) under the tongue every 4 (four) hours as needed. 30 tablet 0   ibuprofen (ADVIL) 200 MG tablet Take 200 mg by mouth every 6 (six) hours as needed.     loratadine (CLARITIN) 10 MG tablet Take 10 mg by mouth daily.     Misc Natural Products (RELIZEN) TABS Take by mouth. Supplement otc     Multiple Vitamins-Minerals (ONE-A-DAY WOMENS 50 PLUS) TABS Take 1 tablet by mouth daily.     primidone (MYSOLINE) 50 MG tablet Take 1 tablet (50 mg total) by mouth nightly. 30 tablet 2   Psyllium (METAMUCIL PO) Take 0.5 fluid ounces by mouth daily.     SUMAtriptan (IMITREX) 50 MG tablet Take 1 tablet (50 mg total) by mouth every 2 (two) hours as needed for migraine. May repeat in 2 hours if headache persists or recurs. 10 tablet 0   Ubrogepant (UBRELVY) 100 MG TABS Take 100 mg by mouth as needed. 16 tablet 5   WELLBUTRIN XL 300 MG 24 hr tablet Take 1 tablet (300 mg total) by mouth daily. 90 tablet 1   zolpidem (AMBIEN) 5 MG tablet Take 1 tablet (5 mg total) by mouth at bedtime as needed for sleep 30 tablet 2   No current facility-administered medications on file prior to visit.    ALLERGIES: Allergies  Allergen Reactions   Penicillins Anaphylaxis   Celebrex [Celecoxib]     Headaches   Shingrix [Zoster Vac Recomb Adjuvanted]     Fever, myalgia, local redness    FAMILY HISTORY: Family History  Problem Relation Age of Onset   Arthritis Other    Breast cancer Other    Coronary artery disease Maternal Grandmother    Breast cancer Maternal Grandmother        carcinoid tumor in abd   Hypertension Other    Diverticulitis Mother    Arthritis Mother    Pancreatic cancer Other       Objective:  Blood pressure 128/84, pulse 88, height '5\' 7"'$  (1.702 m), weight 173 lb 3.2 oz (78.6 kg), SpO2 99 %. General: No acute distress.  Patient appears well-groomed.    Head:  Normocephalic/atraumatic Eyes:  Fundi examined but not visualized Neck: supple, no paraspinal tenderness, full range of motion Heart:  Regular rate and rhythm Neurological Exam: alert and oriented to person, place, and time.  Speech fluent and not dysarthric, language intact.  CN II-XII intact. Bulk and tone normal, muscle strength 5/5 throughout.  Sensation to  light touch intact.  Deep tendon reflexes 2+ throughout.  Finger to nose testing intact.  Gait normal, Romberg negative.   Metta Clines, DO  CC: [name], NP

## 2022-01-27 ENCOUNTER — Ambulatory Visit (INDEPENDENT_AMBULATORY_CARE_PROVIDER_SITE_OTHER): Payer: PRIVATE HEALTH INSURANCE | Admitting: Neurology

## 2022-01-27 ENCOUNTER — Encounter: Payer: Self-pay | Admitting: Neurology

## 2022-01-27 VITALS — BP 128/84 | HR 88 | Ht 67.0 in | Wt 173.2 lb

## 2022-01-27 DIAGNOSIS — G43001 Migraine without aura, not intractable, with status migrainosus: Secondary | ICD-10-CM | POA: Diagnosis not present

## 2022-01-27 DIAGNOSIS — G44209 Tension-type headache, unspecified, not intractable: Secondary | ICD-10-CM | POA: Diagnosis not present

## 2022-01-27 DIAGNOSIS — G250 Essential tremor: Secondary | ICD-10-CM | POA: Diagnosis not present

## 2022-01-27 MED ORDER — PRIMIDONE 50 MG PO TABS: 50.0000 mg | ORAL_TABLET | Freq: Every day | ORAL | 5 refills | Status: DC

## 2022-01-27 MED ORDER — UBRELVY 100 MG PO TABS: 100.0000 mg | ORAL_TABLET | ORAL | 5 refills | Status: DC | PRN

## 2022-01-27 NOTE — Patient Instructions (Signed)
Work on lifestyle modification - stress reduction, improved sleep, exercise. If headaches still persist, we can start tizanidine Continue primidone and Ubrelvy Follow up 4 months.

## 2022-02-04 ENCOUNTER — Ambulatory Visit (INDEPENDENT_AMBULATORY_CARE_PROVIDER_SITE_OTHER): Payer: PRIVATE HEALTH INSURANCE | Admitting: Family

## 2022-02-04 ENCOUNTER — Encounter: Payer: Self-pay | Admitting: Family

## 2022-02-04 VITALS — BP 105/78 | HR 84 | Temp 98.0°F | Resp 16 | Ht 67.5 in | Wt 174.0 lb

## 2022-02-04 DIAGNOSIS — R739 Hyperglycemia, unspecified: Secondary | ICD-10-CM

## 2022-02-04 DIAGNOSIS — G4700 Insomnia, unspecified: Secondary | ICD-10-CM

## 2022-02-04 DIAGNOSIS — E781 Pure hyperglyceridemia: Secondary | ICD-10-CM

## 2022-02-04 DIAGNOSIS — F32A Depression, unspecified: Secondary | ICD-10-CM

## 2022-02-04 DIAGNOSIS — Z0000 Encounter for general adult medical examination without abnormal findings: Secondary | ICD-10-CM | POA: Diagnosis not present

## 2022-02-04 LAB — HEMOGLOBIN A1C: Hgb A1c MFr Bld: 6 % (ref 4.6–6.5)

## 2022-02-04 LAB — LIPID PANEL
Cholesterol: 215 mg/dL — ABNORMAL HIGH (ref 0–200)
HDL: 61.4 mg/dL (ref >39.00)
NonHDL: 153.96
Total CHOL/HDL Ratio: 4
Triglycerides: 240 mg/dL — ABNORMAL HIGH (ref 0.0–149.0)
VLDL: 48 mg/dL — ABNORMAL HIGH (ref 0.0–40.0)

## 2022-02-04 LAB — LDL CHOLESTEROL, DIRECT: Direct LDL: 133 mg/dL

## 2022-02-04 MED ORDER — ZOLPIDEM TARTRATE 5 MG PO TABS: 5.0000 mg | ORAL_TABLET | Freq: Every evening | ORAL | 2 refills | Status: DC | PRN

## 2022-02-04 NOTE — Assessment & Plan Note (Signed)
Stable, continue Wellbutrin.

## 2022-02-04 NOTE — Assessment & Plan Note (Signed)
Continue ambien prn. UDS will be collected today. Controlled substance contract was updated.

## 2022-02-04 NOTE — Progress Notes (Signed)
Subjective:     Patient ID: Pamela Sanders, female    DOB: 1965-09-04, 56 y.o.   MRN: [id number]  Chief Complaint  Patient presents with   Annual Exam    HPI Patient is in today for her CPX.    Immunizations:  Tdap 2015, shingrix x 2, pfizer x 5  Diet:  healthy Wt Readings from Last 3 Encounters:  02/04/22 174 lb (78.9 kg)  01/27/22 173 lb 3.2 oz (78.6 kg)  01/13/22 172 lb (78 kg)  Exercise:  Needs more exercise Colonoscopy: 01/07/2022 Pap Smear: pap per GYN. 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 She reports resolution of previous constipation symptoms and pelvic pain following surgery. She has had some hot flashes which she is managing with an OTC supplement.   Rare use of ambien. She likes to keep it on hand.   Depression- reports mood is well controlled on wellbutrin.    Health Maintenance Due  Topic Date Due   COVID-19 Vaccine (6 - Pfizer risk series) 04/05/2021   PAP SMEAR-Modifier  06/28/2021   MAMMOGRAM  09/18/2021   INFLUENZA VACCINE  01/06/2022    Past Medical History:  Diagnosis Date   Allergic rhinitis 03/07/2008   Qualifier: Diagnosis of  By: Redmond Pulling MD, Karren Cobble of this note might be different from the original. Overview:  Qualifier: Diagnosis of  By: Redmond Pulling MD, LauraLee   Chest pain 12/31/2019   Chest tightness 01/29/2020   Chronic inflammatory arthritis 03/07/2008   Qualifier: Diagnosis of  By: Redmond Pulling MD, Karren Cobble of this note might be different from the original. Overview:  Qualifier: Diagnosis of  By: Redmond Pulling MD, LauraLee   Depression    Depression (emotion)    DIVERTICULITIS, HX OF 03/07/2008   Qualifier: Diagnosis of  By: Redmond Pulling MD, LauraLee     DJD (degenerative joint disease)    chronic neck pain   General medical examination 12/03/2010   GERD  03/07/2008   Qualifier: Diagnosis of  By: Redmond Pulling MD, Karren Cobble of this note might be different from the original. Overview:  Qualifier: Diagnosis of  By: Redmond Pulling MD, Frann Rider   GERD (gastroesophageal reflux disease)    Hip pain    History of diverticulitis of colon    Hx of diverticulitis of colon    Insomnia    Lateral epicondylitis of right elbow    Leukocytopenia    Migraine    Migraine without aura 03/07/2008   Qualifier: Diagnosis of  By: Redmond Pulling MD, LauraLee     Neck pain, chronic    NECK PAIN, CHRONIC 03/07/2008   Qualifier: Diagnosis of  By: Redmond Pulling MD, LauraLee     Osteoarthritis    Pain of left thumb    Pain of right sternoclavicular joint    Phantosmia    PONV (postoperative nausea and vomiting)    Routine general medical examination at a health care facility 08/09/2013   Sinusitis    Sternoclavicular joint pain, right 10/15/2019   TMJ (dislocation of temporomandibular joint)    Tremor of both hands 03/08/2020    Past Surgical History:  Procedure Laterality Date   APPENDECTOMY     CERVICAL ABLATION  05/2021   CERVICAL DISCECTOMY     Dr. Harl Bowie 11/2009- [facility]   COLONOSCOPY  08/03/2014  ESOPHAGOGASTRODUODENOSCOPY     Says it was normal but maybe a peptic ulcer. Early 2000's possibly [facility]   HEMORROIDECTOMY     LAPAROSCOPIC BILATERAL SALPINGO OOPHERECTOMY N/A 10/15/2021   Procedure: LAPAROSCOPIC BILATERAL SALPINGOOPHERECTOMY AND REMOVAL OF UTERINE MASS/PELVIC WASHINGS;  Surgeon: Azucena Fallen, MD;  Location: [facility];  Service: Gynecology;  Laterality: N/A;   OVARIAN CYST SURGERY     abdominal surgery for ovarian cyst   TISSUE GRAFT  02/07/2015   pt reports gum graft for receeding gums--Dr Geralynn Ochs   TMJ ARTHROSCOPY     TMJ surgery    Family History  Problem Relation Age of Onset   Arthritis Other    Breast cancer Other    Coronary artery disease Maternal Grandmother    Breast cancer Maternal Grandmother        carcinoid tumor in abd   Hypertension  Other    Diverticulitis Mother    Arthritis Mother    Pancreatic cancer Other     Social History   Socioeconomic History   Marital status: Married    Spouse name: [name]   Number of children: 0   Years of education: Not on file   Highest education level: Doctorate  Occupational History   Occupation: Probation officer: [employer]  Tobacco Use   Smoking status: Never   Smokeless tobacco: Never  Vaping Use   Vaping Use: Never used  Substance and Sexual Activity   Alcohol use: Yes    Alcohol/week: 0.0 standard drinks of alcohol    Comment: 2 drinks weekly, wine   Drug use: No   Sexual activity: Not on file  Other Topics Concern   Not on file  Social History Narrative   Domestic Partner   no children   She grew up in [place], [place]    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 She lives with her wife in a 2 story home. She drinks 1-2 cups of coffee a day and 1-2 glasses of tea. She exercises regularly.   Social Determinants of Health   Financial Resource Strain: Not on file  Food Insecurity: Not on file  Transportation Needs: Not on file  Physical Activity: Not on file  Stress: Not on file  Social Connections: Not on file  Intimate Partner Violence: Not on file    Outpatient Medications Prior to Visit  Medication Sig Dispense Refill   acetaminophen (TYLENOL) 325 MG tablet Take 650 mg by mouth every 6 (six) hours as needed.     b complex vitamins capsule Take 1 capsule by mouth 2 (two) times a week.     calcium citrate-vitamin D (CITRACAL+D) 315-200 MG-UNIT per tablet Take 2 tablets by mouth daily.     CAPSAICIN EX Apply 1 application. topically daily as needed (muscle pain).     cyclobenzaprine (FLEXERIL) 5 MG tablet Take 5 mg by mouth as needed for muscle spasms.     diphenhydrAMINE  (BENADRYL) 25 MG tablet Take 25 mg by mouth daily as needed for itching or allergies.      famotidine (PEPCID AC) 10 MG chewable tablet Chew 10 mg by mouth daily as needed for heartburn.     gabapentin (NEURONTIN) 300 MG capsule Take 2 capsules by mouth twice daily  360 capsule 0   hyoscyamine (LEVSIN SL) 0.125 MG SL tablet Place 1 tablet (0.125 mg total) under the tongue every 4 (four) hours as needed. 30 tablet 0   ibuprofen (ADVIL) 200 MG tablet Take 200 mg by mouth every 6 (six) hours as needed.     loratadine (CLARITIN) 10 MG tablet Take 10 mg by mouth daily.     Misc Natural Products (RELIZEN) TABS Take by mouth. Supplement otc     Multiple Vitamins-Minerals (ONE-A-DAY WOMENS 50 PLUS) TABS Take 1 tablet by mouth daily.     primidone (MYSOLINE) 50 MG tablet Take 1 tablet (50 mg total) by mouth at bedtime. 30 tablet 5   Psyllium (METAMUCIL PO) Take 0.5 fluid ounces by mouth daily.     SUMAtriptan (IMITREX) 50 MG tablet Take 1 tablet (50 mg total) by mouth every 2 (two) hours as needed for migraine. May repeat in 2 hours if headache persists or recurs. 10 tablet 0   Ubrogepant (UBRELVY) 100 MG TABS Take 100 mg by mouth as needed. 16 tablet 5   WELLBUTRIN XL 300 MG 24 hr tablet Take 1 tablet (300 mg total) by mouth daily. 90 tablet 1   zolpidem (AMBIEN) 5 MG tablet Take 1 tablet (5 mg total) by mouth at bedtime as needed for sleep 30 tablet 2   No facility-administered medications prior to visit.    Allergies  Allergen Reactions   Penicillins Anaphylaxis   Celebrex [Celecoxib]     Headaches   Shingrix [Zoster Vac Recomb Adjuvanted]     Fever, myalgia, local redness    Review of Systems  Constitutional:  Negative for weight loss.  HENT:  Positive for tinnitus (right ear, chronic). Negative for congestion and hearing loss.   Eyes:  Negative for blurred vision.  Respiratory:  Negative for cough.   Cardiovascular:  Negative for leg swelling.  Gastrointestinal:  Negative for constipation  and diarrhea.  Genitourinary:  Negative for dysuria and frequency.  Musculoskeletal:  Negative for joint pain and myalgias.  Neurological:  Positive for headaches (follows with neurology).  Psychiatric/Behavioral:         Denies depression/anxiety       Objective:    Physical Exam  BP 105/78   Pulse 84   Temp 98 F (36.7 C) (Oral)   Resp 16   Ht 5' 7.5" (1.715 m)   Wt 174 lb (78.9 kg)   SpO2 99%   BMI 26.85 kg/m  Wt Readings from Last 3 Encounters:  02/04/22 174 lb (78.9 kg)  01/27/22 173 lb 3.2 oz (78.6 kg)  01/13/22 172 lb (78 kg)   Physical Exam  Constitutional: She is oriented to person, place, and time. She appears well-developed and well-nourished. No distress.  HENT:  Head: Normocephalic and atraumatic.  Right Ear: Tympanic membrane and ear canal normal.  Left Ear: Tympanic membrane and ear canal normal.  Mouth/Throat: Oropharynx is clear and moist.  Eyes: Pupils are equal, round, and reactive to light. No scleral icterus.  Neck: Normal range of motion. No thyromegaly present.  Cardiovascular: Normal rate and regular rhythm.   No murmur heard. Pulmonary/Chest: Effort normal and breath sounds normal. No respiratory distress. He has no wheezes. She has no rales. She exhibits no tenderness.  Abdominal: Soft. Bowel sounds are normal. She exhibits no distension and no mass. There is no tenderness. There is no rebound and no guarding.  Musculoskeletal: She exhibits no edema.  Lymphadenopathy:    She has no cervical adenopathy.  Neurological: She is alert and oriented to person, place, and time. She has normal patellar reflexes. She exhibits normal muscle tone. Coordination normal.  Skin: Skin is warm and dry.  Psychiatric: She has a normal mood and affect. Her behavior is normal. Judgment and thought content normal.  Breast/pelvic: deferred           Assessment & Plan:       Assessment & Plan:   Problem List Items Addressed This Visit        Unprioritized   Routine general medical examination at a health care facility    We discussed healthy diet, exercise.  Pap/mammo up to date at GYN. Will request copies of these reports.  Colo up to date. Recommended covid booster and flu shot this fall at her pharmacy.       Insomnia    Continue ambien prn. UDS will be collected today. Controlled substance contract was updated.       Relevant Orders   DRUG MONITORING, PANEL 8 WITH CONFIRMATION, URINE   Depression    Stable, continue Wellbutrin.       Other Visit Diagnoses     Hypertriglyceridemia    -  Primary   Relevant Orders   Lipid panel   Hyperglycemia       Relevant Orders   Hemoglobin A1c       I have changed Jenell Kernan's zolpidem. I am also having her maintain her calcium citrate-vitamin D, loratadine, famotidine, b complex vitamins, diphenhydrAMINE, Psyllium (METAMUCIL PO), CAPSAICIN EX, One-A-Day Womens 50 Plus, cyclobenzaprine, SUMAtriptan, hyoscyamine, Relizen, ibuprofen, acetaminophen, Wellbutrin XL, gabapentin, primidone, and Ubrelvy.  Meds ordered this encounter  Medications   zolpidem (AMBIEN) 5 MG tablet    Sig: Take 1 tablet (5 mg total) by mouth at bedtime as needed. for sleep    Dispense:  30 tablet    Refill:  2    Order Specific Question:   Supervising Provider    Answer:   Penni Homans A [[id number]]

## 2022-02-04 NOTE — Assessment & Plan Note (Signed)
We discussed healthy diet, exercise.  Pap/mammo up to date at GYN. Will request copies of these reports.  Colo up to date. Recommended covid booster and flu shot this fall at her pharmacy.

## 2022-02-06 ENCOUNTER — Telehealth: Payer: Self-pay | Admitting: Family

## 2022-02-06 DIAGNOSIS — R7303 Prediabetes: Secondary | ICD-10-CM

## 2022-02-06 NOTE — Telephone Encounter (Signed)
See mychart.  

## 2022-02-07 LAB — DRUG MONITORING, PANEL 8 WITH CONFIRMATION, URINE
6 Acetylmorphine: NEGATIVE ng/mL (ref <10)
Alcohol Metabolites: POSITIVE ng/mL — AB (ref <500)
Amphetamines: NEGATIVE ng/mL (ref <500)
Benzodiazepines: NEGATIVE ng/mL (ref <100)
Buprenorphine, Urine: NEGATIVE ng/mL (ref <5)
Cocaine Metabolite: NEGATIVE ng/mL (ref <150)
Creatinine: 11 mg/dL — ABNORMAL LOW (ref >=20.0)
Ethyl Glucuronide (ETG): 1593 ng/mL — ABNORMAL HIGH (ref <500)
Ethyl Sulfate (ETS): 232 ng/mL — ABNORMAL HIGH (ref <100)
MDMA: NEGATIVE ng/mL (ref <500)
Marijuana Metabolite: NEGATIVE ng/mL (ref <20)
Opiates: NEGATIVE ng/mL (ref <100)
Oxidant: NEGATIVE ug/mL (ref <200)
Oxycodone: NEGATIVE ng/mL (ref <100)
Specific Gravity: 1.002 — ABNORMAL LOW (ref >=1.003)
pH: 7.5 (ref 4.5–9.0)

## 2022-02-07 LAB — DM TEMPLATE

## 2022-02-17 ENCOUNTER — Encounter: Payer: Self-pay | Admitting: Family

## 2022-03-20 ENCOUNTER — Encounter: Payer: Self-pay | Admitting: Family

## 2022-04-28 ENCOUNTER — Other Ambulatory Visit: Payer: Self-pay | Admitting: Family

## 2022-05-11 ENCOUNTER — Ambulatory Visit (INDEPENDENT_AMBULATORY_CARE_PROVIDER_SITE_OTHER): Payer: PRIVATE HEALTH INSURANCE

## 2022-05-11 ENCOUNTER — Ambulatory Visit (INDEPENDENT_AMBULATORY_CARE_PROVIDER_SITE_OTHER): Payer: PRIVATE HEALTH INSURANCE | Admitting: Podiatry

## 2022-05-11 VITALS — BP 124/79 | HR 73 | Temp 98.6°F | Resp 17

## 2022-05-11 DIAGNOSIS — M79672 Pain in left foot: Secondary | ICD-10-CM

## 2022-05-11 DIAGNOSIS — G5762 Lesion of plantar nerve, left lower limb: Secondary | ICD-10-CM | POA: Diagnosis not present

## 2022-05-11 DIAGNOSIS — M79671 Pain in right foot: Secondary | ICD-10-CM | POA: Diagnosis not present

## 2022-05-11 DIAGNOSIS — G5761 Lesion of plantar nerve, right lower limb: Secondary | ICD-10-CM

## 2022-05-11 DIAGNOSIS — D3610 Benign neoplasm of peripheral nerves and autonomic nervous system, unspecified: Secondary | ICD-10-CM

## 2022-05-11 MED ORDER — TRIAMCINOLONE ACETONIDE 10 MG/ML IJ SUSP
10.0000 mg | Freq: Once | INTRAMUSCULAR | Status: AC
  Administered 2022-05-11: 10 mg

## 2022-05-11 NOTE — Patient Instructions (Signed)
While at your visit today you received a steroid injection in your foot or ankle to help with your pain. Along with having the steroid medication there is some "numbing" medication in the shot that you received. Due to this you may notice some numbness to the area for the next couple of hours.   I would recommend limiting activity for the next few days to help the steroid injection take affect.    The actually benefit from the steroid injection may take up to 2-7 days to see a difference. You may actually experience a small (as in 10%) INCREASE in pain in the first 24 hours---that is common. It would be best if you can ice the area today and take anti-inflammatory medications (such as Ibuprofen, Motrin, or Aleve) if you are able to take these medications. If you were prescribed another medication to help with the pain go ahead and start that medication today    Things to watch out for that you should contact us or a health care provider urgently would include: 1. Unusual (as in more than 10%) increase in pain 2. New fever > 101.5 3. New swelling or redness of the injected area.  4. Streaking of red lines around the area injected.  If you have any questions or concerns about this, please give our office a call at [phone].   Morton Neuralgia  Morton neuralgia is foot pain that affects the ball of the foot and the area near the toes. Morton neuralgia occurs when part of a nerve in the foot (digital nerve) is under too much pressure (compressed). When this happens over a long period of time, the nerve can thicken (neuroma) and cause pain. Pain usually occurs between the third and fourth toes.  Morton neuralgia can come and go but may get worse over time. What are the causes? This condition is caused by doing the same things over and over with your foot, such as: Activities such as running or jumping. Wearing shoes that are too tight. What increases the risk? You may be at higher risk for  Morton neuralgia if you: Are female. Wear high heels. Wear shoes that are narrow or tight. Do activities that repeatedly stretch your toes, such as: Running. Ballet. Long-distance walking. What are the signs or symptoms? The first symptom of Morton neuralgia is pain that spreads from the ball of the foot to the toes. It may feel like you are walking on a marble. Pain usually gets worse with walking and goes away at night. Other symptoms may include numbness and cramping of your toes. Both feet are equally affected, but rarely at the same time. How is this diagnosed? This condition is diagnosed based on your symptoms, your medical history, and a physical exam. Your health care provider may: Squeeze your foot just behind your toe. Ask you to move your toes to check for pain. Ask about your physical activity level. You also may have imaging tests, such as an X-ray, ultrasound, or MRI. How is this treated? Treatment depends on how severe your condition is and what causes it. Treatment may involve: Wearing different shoes that are not too tight, are low-heeled, and provide good support. For some people, this is the only treatment needed. Wearing an over-the-counter or custom supportive pad (orthotic) under the front of your foot. Getting injections of numbing medicine and anti-inflammatory medicine (steroid) in the nerve. Having surgery to remove part of the thickened nerve. Follow these instructions at home: Managing pain, stiffness, and  swelling  Massage your foot as needed. Wear orthotics as told by your health care provider. If directed, put ice on the painful area. To do this: Put ice in a plastic bag. Place a towel between your skin and the bag. Leave the ice on for 20 minutes, 2-3 times a day. Remove the ice if your skin turns bright red. This is very important. If you cannot feel pain, heat, or cold, you have a greater risk of damage to the area. Raise (elevate) the injured area  above the level of your heart while you are sitting or lying down. Avoid activities that cause pain or make pain worse. If you play sports, ask your health care provider when it is safe for you to return to sports. General instructions Take over-the-counter and prescription medicines only as told by your health care provider. For the time period you were told by your health care provider, do not drive or use machinery. Wear shoes that: Have soft soles. Have a wide toe area. Provide arch support. Do not pinch or squeeze your feet. Have room for your orthotics, if this applies. Keep all follow-up visits. This is important. Contact a health care provider if: Your symptoms get worse or do not get better with treatment and home care. Summary Morton neuralgia is foot pain that affects the ball of the foot and the area near the toes. Pain usually occurs between the third and fourth toes, gets worse with walking, and goes away at night. Morton neuralgia occurs when part of a nerve in the foot (digital nerve) is under too much pressure. When this happens over a long period of time, the nerve can thicken (neuroma) and cause pain. This condition is caused by doing the same things over and over with your foot, such as running or jumping, wearing shoes that are too tight, or wearing high heels. Treatment may involve wearing low-heeled shoes that are not too tight, wearing a supportive pad (orthotic) under the front of your foot, getting injections in the nerve, or having surgery to remove part of the thickened nerve. This information is not intended to replace advice given to you by your health care provider. Make sure you discuss any questions you have with your health care provider. Document Revised: 11/01/2020 Document Reviewed: 11/01/2020 Elsevier Patient Education  [place].

## 2022-05-11 NOTE — Progress Notes (Unsigned)
Subjective:   Patient ID: Pamela Sanders, female   DOB: 56 y.o.   MRN: [id number]   HPI Chief Complaint  Patient presents with   Foot Pain    Patient came in today for bilateral foot pain, which started a month ago, patient is having pain on the balls and the lateral sides of the foot, rate of pain 7 out 10, patient states it hurts when she first stepped down the walk, X-Rays taken today   56 year old female presents to the office today with above concerns. She gets a tingling sensation into the ball of the foot. It hurts to put weight on the foot. After a few minutues she can start to walk.  This has been ongoing for over a month. Feels like a vibration sensation. She has had ball of the foot pain before and she uses pads and it goes away butthis time it is different. When she planatar fasles foot she gets some stretching to hte top of the foot. No radiation. No treatment.   On gabapentin for neck   No back pain.  No tobacco Occasional etoh.  No diabetes.   Review of Systems  All other systems reviewed and are negative.       Objective:  Physical Exam  General: AAO x3, NAD  Dermatological: Skin is warm, dry and supple bilateral.  There are no open sores, no preulcerative lesions, no rash or signs of infection present.  Vascular: Dorsalis Pedis artery and Posterior Tibial artery pedal pulses are 2/4 bilateral with immedate capillary fill time. There is no pain with calf compression, swelling, warmth, erythema.   Neruologic: Grossly intact via light touch bilateral.  Negative Tinel sign.  Musculoskeletal: There is tenderness along the second and third interspaces but there is notable neuroma present third interspaces bilaterally positive click present.  There is no area pinpoint tenderness.  Flexor, extensor tendons appear to be intact.  MMT 5/5.  Gait: Unassisted, Nonantalgic.       Assessment:   Bilateral neuromas     Plan:  -Treatment options discussed including all  alternatives, risks, and complications -Etiology of symptoms were discussed -X-rays were obtained and reviewed with the patient.  3 views bilateral feet were obtained.  No evidence of acute fracture. -Steroid injection performed bilaterally.  Skin skin with alcohol and mixture 1 cc Kenalog 10, 0.5 cc of Marcaine plain, 0.5 cc of lidocaine plain was infiltrated into the third interspaces bilaterally without complications.  Postinjection care discussed.  Tolerated well. -Dispensed metatarsal pads.  Discussion modifications good arch support.  Trula Slade DPM

## 2022-05-28 ENCOUNTER — Other Ambulatory Visit (HOSPITAL_BASED_OUTPATIENT_CLINIC_OR_DEPARTMENT_OTHER): Payer: Self-pay

## 2022-05-28 ENCOUNTER — Ambulatory Visit (INDEPENDENT_AMBULATORY_CARE_PROVIDER_SITE_OTHER): Payer: PRIVATE HEALTH INSURANCE | Admitting: Family Medicine

## 2022-05-28 DIAGNOSIS — J40 Bronchitis, not specified as acute or chronic: Secondary | ICD-10-CM | POA: Diagnosis not present

## 2022-05-28 DIAGNOSIS — R059 Cough, unspecified: Secondary | ICD-10-CM | POA: Diagnosis not present

## 2022-05-28 DIAGNOSIS — R739 Hyperglycemia, unspecified: Secondary | ICD-10-CM | POA: Diagnosis not present

## 2022-05-28 MED ORDER — HYDROCODONE BIT-HOMATROP MBR 5-1.5 MG/5ML PO SOLN
5.0000 mL | Freq: Four times a day (QID) | ORAL | 0 refills | Status: DC | PRN
  Filled 2022-05-28: qty 120, 6d supply, fill #0

## 2022-05-28 MED ORDER — METHYLPREDNISOLONE 4 MG PO TABS
ORAL_TABLET | ORAL | 0 refills | Status: AC
  Filled 2022-05-28: qty 15, 5d supply, fill #0

## 2022-05-28 MED ORDER — DOXYCYCLINE HYCLATE 100 MG PO TABS
100.0000 mg | ORAL_TABLET | Freq: Two times a day (BID) | ORAL | 0 refills | Status: DC
  Filled 2022-05-28: qty 20, 10d supply, fill #0

## 2022-05-28 MED ORDER — ALBUTEROL SULFATE HFA 108 (90 BASE) MCG/ACT IN AERS
2.0000 | INHALATION_SPRAY | Freq: Four times a day (QID) | RESPIRATORY_TRACT | 0 refills | Status: DC | PRN
  Filled 2022-05-28: qty 6.7, 21d supply, fill #0

## 2022-05-28 MED ORDER — BENZONATATE 100 MG PO CAPS
100.0000 mg | ORAL_CAPSULE | Freq: Three times a day (TID) | ORAL | 1 refills | Status: DC | PRN
  Filled 2022-05-28: qty 40, 7d supply, fill #0
  Filled 2022-06-04: qty 40, 7d supply, fill #1

## 2022-05-28 NOTE — Patient Instructions (Signed)

## 2022-05-28 NOTE — Progress Notes (Signed)
Subjective:   By signing my name below, I, Kellie Simmering, attest that this documentation has been prepared under the direction and in the presence of Mosie Lukes, MD., 05/28/2022.     Patient ID: Pamela Sanders, female    DOB: Jan 25, 1966, 56 y.o.   MRN: [id number]  Chief Complaint  Patient presents with   Cough   HPI Patient is in today for an office visit. She denies CP/palpitations/SOB/HA/GI or GU c/o.  Chest Congestion/Cough Patient reports that 2 days ago, on 05/26/2022, she experienced tightness in her chest. This morning she woke up with this same tightness in the chest and thick sputum production. She is coughing during today's visit and states this is keeping her up at night. She is not a smoker. Denies fever/chills/myalgias. She is currently taking otc Mucinex, Sudafed, Tylenol, and a cough suppressant at night. She has also refilled an old prescription of Benzonatate 100 mg capsules. She had a surgery scheduled for today but had to reschedule due to her current condition.  Past Medical History:  Diagnosis Date   Allergic rhinitis 03/07/2008   Qualifier: Diagnosis of  By: Redmond Pulling MD, Karren Cobble of this note might be different from the original. Overview:  Qualifier: Diagnosis of  By: Redmond Pulling MD, LauraLee   Chest pain 12/31/2019   Chest tightness 01/29/2020   Chronic inflammatory arthritis 03/07/2008   Qualifier: Diagnosis of  By: Redmond Pulling MD, Karren Cobble of this note might be different from the original. Overview:  Qualifier: Diagnosis of  By: Redmond Pulling MD, LauraLee   Depression    Depression (emotion)    DIVERTICULITIS, HX OF 03/07/2008   Qualifier: Diagnosis of  By: Redmond Pulling MD, LauraLee     DJD (degenerative joint disease)    chronic neck pain   General medical examination 12/03/2010   GERD 03/07/2008   Qualifier: Diagnosis of  By: Redmond Pulling MD, Karren Cobble of this note might be different from the original. Overview:  Qualifier: Diagnosis of   By: Redmond Pulling MD, Frann Rider   GERD (gastroesophageal reflux disease)    Hip pain    History of diverticulitis of colon    Hx of diverticulitis of colon    Insomnia    Lateral epicondylitis of right elbow    Leukocytopenia    Migraine    Migraine without aura 03/07/2008   Qualifier: Diagnosis of  By: Redmond Pulling MD, LauraLee     Neck pain, chronic    NECK PAIN, CHRONIC 03/07/2008   Qualifier: Diagnosis of  By: Redmond Pulling MD, LauraLee     Osteoarthritis    Pain of left thumb    Pain of right sternoclavicular joint    Phantosmia    PONV (postoperative nausea and vomiting)    Routine general medical examination at a health care facility 08/09/2013   Sinusitis    Sternoclavicular joint pain, right 10/15/2019   TMJ (dislocation of temporomandibular joint)    Tremor of both hands 03/08/2020   Past Surgical History:  Procedure Laterality Date   APPENDECTOMY     CERVICAL ABLATION  05/2021   CERVICAL DISCECTOMY     Dr. Harl Bowie 11/2009- [facility]   COLONOSCOPY  08/03/2014   ESOPHAGOGASTRODUODENOSCOPY     Says it was normal but maybe a peptic ulcer. Early 2000's possibly [facility]   HEMORROIDECTOMY     LAPAROSCOPIC BILATERAL SALPINGO OOPHERECTOMY N/A 10/15/2021   Procedure: LAPAROSCOPIC BILATERAL SALPINGOOPHERECTOMY AND REMOVAL OF UTERINE MASS/PELVIC WASHINGS;  Surgeon: Azucena Fallen, MD;  Location: [facility] OR;  Service: Gynecology;  Laterality: N/A;   OVARIAN CYST SURGERY     abdominal surgery for ovarian cyst   TISSUE GRAFT  02/07/2015   pt reports gum graft for receeding gums--Dr Geralynn Ochs   TMJ ARTHROSCOPY     TMJ surgery   Family History  Problem Relation Age of Onset   Arthritis Other    Breast cancer Other    Coronary artery disease Maternal Grandmother    Breast cancer Maternal Grandmother        carcinoid tumor in abd   Hypertension Other    Diverticulitis Mother    Arthritis Mother    Pancreatic cancer Other    Social History   Socioeconomic History   Marital status: Married    Spouse name:  [name]   Number of children: 0   Years of education: Not on file   Highest education level: Doctorate  Occupational History   Occupation: Probation officer: [employer]  Tobacco Use   Smoking status: Never   Smokeless tobacco: Never  Vaping Use   Vaping Use: Never used  Substance and Sexual Activity   Alcohol use: Yes    Alcohol/week: 0.0 standard drinks of alcohol    Comment: 2 drinks weekly, wine   Drug use: No   Sexual activity: Not on file  Other Topics Concern   Not on file  Social History Narrative   Domestic Partner   no children   She grew up in [place], [place]    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 She lives with her wife in a 2 story home. She drinks 1-2 cups of coffee a day and 1-2 glasses of tea. She exercises regularly.   Social Determinants of Health   Financial Resource Strain: Not on file  Food Insecurity: Not on file  Transportation Needs: Not on file  Physical Activity: Not on file  Stress: Not on file  Social Connections: Not on file  Intimate Partner Violence: Not on file   Outpatient Medications Prior to Visit  Medication Sig Dispense Refill   acetaminophen (TYLENOL) 325 MG tablet Take 650 mg by mouth every 6 (six) hours as needed.     b complex vitamins capsule Take 1 capsule by mouth 2 (two) times a week.     calcium citrate-vitamin D (CITRACAL+D) 315-200 MG-UNIT per tablet Take 2 tablets by mouth daily.     CAPSAICIN EX Apply 1 application. topically daily as needed (muscle pain).     cyclobenzaprine (FLEXERIL) 5 MG tablet Take 5 mg by mouth as needed for muscle spasms.     diphenhydrAMINE (BENADRYL) 25 MG tablet Take 25 mg by mouth daily as needed for itching or allergies.      famotidine (PEPCID AC) 10 MG chewable tablet Chew 10 mg by mouth daily as needed for  heartburn.     gabapentin (NEURONTIN) 300 MG capsule Take 2 capsules (600 mg total) by mouth 2 (two) times daily. 360 capsule 1   hyoscyamine (LEVSIN SL) 0.125 MG SL tablet Place 1 tablet (0.125 mg total) under the tongue every 4 (four) hours as needed. 30 tablet 0   ibuprofen (ADVIL) 200 MG tablet Take 200 mg by mouth every 6 (six) hours as  needed.     loratadine (CLARITIN) 10 MG tablet Take 10 mg by mouth daily.     Misc Natural Products (RELIZEN) TABS Take by mouth. Supplement otc     Multiple Vitamins-Minerals (ONE-A-DAY WOMENS 50 PLUS) TABS Take 1 tablet by mouth daily.     primidone (MYSOLINE) 50 MG tablet Take 1 tablet (50 mg total) by mouth at bedtime. 30 tablet 5   Psyllium (METAMUCIL PO) Take 0.5 fluid ounces by mouth daily.     SUMAtriptan (IMITREX) 50 MG tablet Take 1 tablet (50 mg total) by mouth every 2 (two) hours as needed for migraine. May repeat in 2 hours if headache persists or recurs. 10 tablet 0   Ubrogepant (UBRELVY) 100 MG TABS Take 100 mg by mouth as needed. 16 tablet 5   WELLBUTRIN XL 300 MG 24 hr tablet Take 1 tablet (300 mg total) by mouth daily. 90 tablet 1   zolpidem (AMBIEN) 5 MG tablet Take 1 tablet (5 mg total) by mouth at bedtime as needed. for sleep 30 tablet 2   No facility-administered medications prior to visit.   Allergies  Allergen Reactions   Penicillins Anaphylaxis   Celebrex [Celecoxib]     Headaches   Shingrix [Zoster Vac Recomb Adjuvanted]     Fever, myalgia, local redness   Review of Systems  Constitutional:  Negative for chills and fever.  HENT:  Positive for congestion. Negative for sinus pain.   Respiratory:  Positive for cough and sputum production. Negative for shortness of breath.   Cardiovascular:  Negative for chest pain and palpitations.       (+) tightness in the chest.  Gastrointestinal:  Negative for abdominal pain, blood in stool, constipation, diarrhea, nausea and vomiting.  Genitourinary:  Negative for dysuria, frequency,  hematuria and urgency.  Musculoskeletal:  Negative for myalgias.  Skin:           Neurological:  Negative for headaches.      Objective:    Physical Exam Constitutional:      General: She is not in acute distress.    Appearance: Normal appearance. She is normal weight. She is not ill-appearing.  HENT:     Head: Normocephalic and atraumatic.     Right Ear: External ear normal.     Left Ear: External ear normal.     Nose: Nose normal.     Mouth/Throat:     Mouth: Mucous membranes are moist.     Pharynx: Oropharynx is clear.  Eyes:     General:        Right eye: No discharge.        Left eye: No discharge.     Extraocular Movements: Extraocular movements intact.     Conjunctiva/sclera: Conjunctivae normal.     Pupils: Pupils are equal, round, and reactive to light.  Cardiovascular:     Rate and Rhythm: Normal rate and regular rhythm.     Pulses: Normal pulses.     Heart sounds: Normal heart sounds. No murmur heard.    No gallop.  Pulmonary:     Effort: Pulmonary effort is normal. No respiratory distress.     Breath sounds: Normal breath sounds. No wheezing or rales.  Abdominal:     General: Bowel sounds are normal.     Palpations: Abdomen is soft.     Tenderness: There is no abdominal tenderness. There is no guarding.  Musculoskeletal:        General: Normal range of motion.  Cervical back: Normal range of motion.     Right lower leg: No edema.     Left lower leg: No edema.  Skin:    General: Skin is warm and dry.  Neurological:     Mental Status: She is alert and oriented to person, place, and time.  Psychiatric:        Mood and Affect: Mood normal.        Behavior: Behavior normal.        Judgment: Judgment normal.    BP 126/80 (BP Location: Right Arm, Patient Position: Sitting, Cuff Size: Normal)   Pulse 90   Temp 98 F (36.7 C) (Oral)   Resp 16   Ht _0  (1.702 m)   Wt 170 lb (77.1 kg)   SpO2 97%   BMI 26.63 kg/m  Wt Readings from Last 3  Encounters:  05/28/22 170 lb (77.1 kg)  02/04/22 174 lb (78.9 kg)  01/27/22 173 lb 3.2 oz (78.6 kg)   Diabetic Foot Exam - Simple   No data filed    Lab Results  Component Value Date   WBC 8.3 05/28/2022   HGB 13.4 05/28/2022   HCT 40.6 05/28/2022   PLT 202.0 05/28/2022   GLUCOSE 89 05/28/2022   CHOL 215 (H) 02/04/2022   TRIG 240.0 (H) 02/04/2022   HDL 61.40 02/04/2022   LDLDIRECT 133.0 02/04/2022   LDLCALC 99 09/25/2019   ALT 20 05/28/2022   AST 18 05/28/2022   NA 140 05/28/2022   K 4.2 05/28/2022   CL 106 05/28/2022   CREATININE 0.93 05/28/2022   BUN 11 05/28/2022   CO2 27 05/28/2022   TSH 2.90 01/02/2021   HGBA1C 5.9 05/28/2022   Lab Results  Component Value Date   TSH 2.90 01/02/2021   Lab Results  Component Value Date   WBC 8.3 05/28/2022   HGB 13.4 05/28/2022   HCT 40.6 05/28/2022   MCV 88.1 05/28/2022   PLT 202.0 05/28/2022   Lab Results  Component Value Date   NA 140 05/28/2022   K 4.2 05/28/2022   CO2 27 05/28/2022   GLUCOSE 89 05/28/2022   BUN 11 05/28/2022   CREATININE 0.93 05/28/2022   BILITOT 0.2 05/28/2022   ALKPHOS 89 05/28/2022   AST 18 05/28/2022   ALT 20 05/28/2022   PROT 6.9 05/28/2022   ALBUMIN 4.2 05/28/2022   CALCIUM 9.0 05/28/2022   ANIONGAP 3 (L) 10/12/2021   GFR 68.40 05/28/2022   Lab Results  Component Value Date   CHOL 215 (H) 02/04/2022   Lab Results  Component Value Date   HDL 61.40 02/04/2022   Lab Results  Component Value Date   LDLCALC 99 09/25/2019   Lab Results  Component Value Date   TRIG 240.0 (H) 02/04/2022   Lab Results  Component Value Date   CHOLHDL 4 02/04/2022   Lab Results  Component Value Date   HGBA1C 5.9 05/28/2022      Assessment & Plan:  Chest Congestion/Cough: Encouraged patient to test for COVID-19 at home in two days and continue taking otc Mucinex. Prescribed Albuterol inhaler, Benzonatate, Doxycycline, Methylprednisolone, and a liquid cough suppressant.  Labs: Blood work  today will check CBC, CMP, HGBA1C, and vitamin D. Problem List Items Addressed This Visit     Bronchitis    Negative covid test, repeat in 2 days. Started on Doxy and steroids, given Hydromet to use prn for cough qhs and can use Tessalon during the day. Encouraged increased rest and hydration,  add probiotics, zinc such as Coldeze or Xicam. Treat fevers as needed       Hyperglycemia    hgba1c acceptable, minimize simple carbs. Increase exercise as tolerated.       Relevant Orders   HgB A1c (Completed)   Hypercalcemia - Primary    Asymptomatic, check labs      Relevant Orders   Comp Met (CMET) (Completed)   Vitamin D (25 hydroxy) (Completed)   Other Visit Diagnoses     Cough, unspecified type       Relevant Orders   CBC w/Diff (Completed)      Meds ordered this encounter  Medications   benzonatate (TESSALON PERLES) 100 MG capsule    Sig: Take 1-2 capsules (100-200 mg total) by mouth 3 (three) times daily as needed for cough.    Dispense:  40 capsule    Refill:  1   albuterol (VENTOLIN HFA) 108 (90 Base) MCG/ACT inhaler    Sig: Inhale 2 puffs into the lungs every 6 (six) hours as needed for wheezing or shortness of breath.    Dispense:  6.7 g    Refill:  0   methylPREDNISolone (MEDROL) 4 MG tablet    Sig: Take 5 tablets (20 mg total) by mouth daily for 1 day, THEN 4 tablets (16 mg total) daily for 1 day, THEN 3 tablets (12 mg total) daily for 1 day, THEN 2 tablets (8 mg total) daily for 1 day, THEN 1 tablet (4 mg total) daily for 1 day.    Dispense:  15 tablet    Refill:  0   doxycycline (VIBRA-TABS) 100 MG tablet    Sig: Take 1 tablet (100 mg total) by mouth 2 (two) times daily.    Dispense:  20 tablet    Refill:  0   HYDROcodone bit-homatropine (HYDROMET) 5-1.5 MG/5ML syrup    Sig: Take 5 mLs by mouth every 6 (six) hours as needed for cough.    Dispense:  120 mL    Refill:  0   I, Penni Homans, MD, personally preformed the services described in this documentation.   All medical record entries made by the scribe were at my direction and in my presence.  I have reviewed the chart and discharge instructions (if applicable) and agree that the record reflects my personal performance and is accurate and complete. 05/28/2022  I,Mohammed Iqbal,acting as a scribe for Penni Homans, MD.,have documented all relevant documentation on the behalf of Penni Homans, MD,as directed by  Penni Homans, MD while in the presence of Penni Homans, MD.  Penni Homans, MD

## 2022-05-29 LAB — COMPREHENSIVE METABOLIC PANEL
ALT: 20 U/L (ref 0–35)
AST: 18 U/L (ref 0–37)
Albumin: 4.2 g/dL (ref 3.5–5.2)
Alkaline Phosphatase: 89 U/L (ref 39–117)
BUN: 11 mg/dL (ref 6–23)
CO2: 27 mEq/L (ref 19–32)
Calcium: 9 mg/dL (ref 8.4–10.5)
Chloride: 106 mEq/L (ref 96–112)
Creatinine, Ser: 0.93 mg/dL (ref 0.40–1.20)
GFR: 68.4 mL/min (ref >60.00)
Glucose, Bld: 89 mg/dL (ref 70–99)
Potassium: 4.2 mEq/L (ref 3.5–5.1)
Sodium: 140 mEq/L (ref 135–145)
Total Bilirubin: 0.2 mg/dL (ref 0.2–1.2)
Total Protein: 6.9 g/dL (ref 6.0–8.3)

## 2022-05-29 LAB — CBC WITH DIFFERENTIAL/PLATELET
Basophils Absolute: 0 10*3/uL (ref 0.0–0.1)
Basophils Relative: 0.3 % (ref 0.0–3.0)
Eosinophils Absolute: 0.2 10*3/uL (ref 0.0–0.7)
Eosinophils Relative: 2.1 % (ref 0.0–5.0)
HCT: 40.6 % (ref 36.0–46.0)
Hemoglobin: 13.4 g/dL (ref 12.0–15.0)
Lymphocytes Relative: 18.5 % (ref 12.0–46.0)
Lymphs Abs: 1.5 10*3/uL (ref 0.7–4.0)
MCHC: 32.9 g/dL (ref 30.0–36.0)
MCV: 88.1 fl (ref 78.0–100.0)
Monocytes Absolute: 0.6 10*3/uL (ref 0.1–1.0)
Monocytes Relative: 7.3 % (ref 3.0–12.0)
Neutro Abs: 5.9 10*3/uL (ref 1.4–7.7)
Neutrophils Relative %: 71.8 % (ref 43.0–77.0)
Platelets: 202 10*3/uL (ref 150.0–400.0)
RBC: 4.61 Mil/uL (ref 3.87–5.11)
RDW: 13.3 % (ref 11.5–15.5)
WBC: 8.3 10*3/uL (ref 4.0–10.5)

## 2022-05-29 LAB — HEMOGLOBIN A1C: Hgb A1c MFr Bld: 5.9 % (ref 4.6–6.5)

## 2022-05-30 ENCOUNTER — Encounter: Payer: Self-pay | Admitting: Family Medicine

## 2022-05-30 LAB — VITAMIN D 25 HYDROXY (VIT D DEFICIENCY, FRACTURES): VITD: 46.67 ng/mL (ref 30.00–100.00)

## 2022-05-31 ENCOUNTER — Encounter: Payer: Self-pay | Admitting: Neurology

## 2022-06-02 DIAGNOSIS — R739 Hyperglycemia, unspecified: Secondary | ICD-10-CM | POA: Insufficient documentation

## 2022-06-02 NOTE — Assessment & Plan Note (Signed)
Negative covid test, repeat in 2 days. Started on Doxy and steroids, given Hydromet to use prn for cough qhs and can use Tessalon during the day. Encouraged increased rest and hydration, add probiotics, zinc such as Coldeze or Xicam. Treat fevers as needed

## 2022-06-02 NOTE — Assessment & Plan Note (Signed)
Asymptomatic, check labs

## 2022-06-02 NOTE — Assessment & Plan Note (Signed)
hgba1c acceptable, minimize simple carbs. Increase exercise as tolerated.  

## 2022-06-03 ENCOUNTER — Other Ambulatory Visit: Payer: Self-pay | Admitting: Podiatry

## 2022-06-03 DIAGNOSIS — M79671 Pain in right foot: Secondary | ICD-10-CM

## 2022-06-03 DIAGNOSIS — D3610 Benign neoplasm of peripheral nerves and autonomic nervous system, unspecified: Secondary | ICD-10-CM

## 2022-06-03 NOTE — Progress Notes (Signed)
Virtual Visit via Video Note  Consent was obtained for video visit:  Yes.   Answered questions that patient had about telehealth interaction:  Yes.   I discussed the limitations, risks, security and privacy concerns of performing an evaluation and management service by telemedicine. I also discussed with the patient that there may be a patient responsible charge related to this service. The patient expressed understanding and agreed to proceed.  Pt location: Home Physician Location: office Name of referring provider:  Sandford Craze, NP I connected with Pamela Sanders at patients initiation/request on 06/04/2022 at 10:10 AM EST by video enabled telemedicine application and verified that I am speaking with the correct person using two identifiers. Pt MRN:  [id number] Pt DOB:  [birth year] Video Participants:  Pamela Sanders  Assessment/Plan:    Tension type headache, not intractable Essential tremor Migraine with and without aura, without status migrainosus, intractable   1  Restart Primidone 50mg  at bedtime for ET when she finishes treatment for her cold (25mg  QHS for one week, then increase to 50mg  QHS) 3  Ubrelvy 100mg  at the earliest onset next time she has a migraine 4  Follow up 9 months.   Subjective:  Pamela Sanders is a 56 year old right-handed female with degenerative joint disease with chronic neck pain s/p ACDF C5-C6 (2011) follows up for essential tremor.     UPDATE: Tremor: Currently taking primidone 50mg  at bedtime Controlled.  However, she had to stop taking it now due to possible interactions with current mediations she is taking for a cold.   Migraines: 1 low-grade headache a couple of months ago.  Responded to [place].     Current NSAIDS:  Ibuprofen (for tension-type headache) Current analgesics:  Tylenol (for tension-type headache) Current triptans:  none Current ergotamine:  no Current anti-emetic:  no Current muscle relaxants:  no Current sleep aide:   Ambien Current Antihypertensive medications:  no Current Antidepressant medications:  Wellbutrin XL 150mg  daily Current Anticonvulsant medications:  Gabapentin 600mg  twice daily, primidone 50mg  at bedtime (ET) Current anti-CGRP:  Ubrelvy 100mg  Current Vitamins/Herbal/Supplements:  no Current Antihistamines/Decongestants: Benadryl, Claritin Other therapy:  no Other medications:  Ambien   HISTORY: She started noticing tremor in 2021.  It is bilateral, but worse on the right.  It occurs when hands are with use or outstretched.  It does not occur at rest.  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  Her hands will shake.  No trouble swallowing.  No trouble with walking.  No history of REM sleep behavior disorder.  She does occasionally drink wine but has not noticed if symptoms subside when drinking.  She reports that her great grandmother had Parkinson's disease but no known family history of essential tremor.   Works in a lab papet tremors fluctuates right worse than left - over a year -    [place] grandmother had PD.  No family history of tremors    Migraines/Tension-type headache/chronic neck pain: Onset:  1999 Location:  Left or right frontal/retro-orbital, sometimes occipital, chronic left sided neck pain.  She has constant pain in left jaw (history of jaw surgery for TMJ.  She uses a bite guard for bruxism.   Quality:  Stabbing/throbbing Intensity:  Moderate to severe.  Aura:  Sometimes "bluish vision".  One time had tunnel vision in 2005. Prodrome:  no Postdrome:  no Associated symptoms:  Photophobia,  phonophobia.  Sometimes tinnitus.  She denies associated nausea, vomiting, visual disturbance, autonomic symptoms or unilateral numbness or weakness. Duration:  1/2 to 1 day Frequency:  Once every 5 to 6 months Frequency of abortive medication: 2 days a week Triggers/exacerbating  factors:  Emotional stress, skipped meals, sleep deprivation Relieving factors:  Laying down in dark and quiet room Activity:  Avoids   She also has tension-type headaches in the back of her head radiating into the neck as well as headaches at the bridge of her nose.  They last a day.  They occur 2 days a week.     MRI of brain with and without contrast (01/10/13):  "1.  No acute intracranial abnormality.  2.  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  C5-C6 ACDF with anterior plate and screw fixation and complete graft incorporation.  No motion with flexion/extension to suggest pseudoarthrosis.  2.  Mild C4-C5 and C6-C7 degenerative disc disease.  Mild bilateral C7-T1 facet arthropathy.  3.  No acute fracture or prevertebral swelling.  4.  Multiple mandibular screws bilaterally."   Past NSAIDS:  etodolac Past analgesics:  no Past abortive triptans:  Sumatriptan (causes nausea) Past abortive ergotamine:  no Past muscle relaxants:  Tizanidine 4mg  (helped) Past anti-emetic:  Compazine (used for sumatriptan-induced nausea) Past antihypertensive medications:  no Past antidepressant medications:  Nortriptyline (did not tolerate) Past anticonvulsant medications:  Topiramate (disoriented), Keppra Past anti-CGRP:  no Past vitamins/Herbal/Supplements:  no Past antihistamines/decongestants:  no Other past therapies:  Botox (effective but caused welts and itching), massage, caffeine  Past Medical History: Past Medical History:  Diagnosis Date   Allergic rhinitis 03/07/2008   Qualifier: Diagnosis of  By: Andrey Campanile MD, Carrie Mew of this note might be different from the original. Overview:  Qualifier: Diagnosis of  By: Andrey Campanile MD, LauraLee   Chest pain 12/31/2019   Chest tightness 01/29/2020   Chronic inflammatory arthritis 03/07/2008   Qualifier: Diagnosis of  By: Andrey Campanile MD, Carrie Mew of this note might be  different from the original. Overview:  Qualifier: Diagnosis of  By: Andrey Campanile MD, LauraLee   Depression    Depression (emotion)    DIVERTICULITIS, HX OF 03/07/2008   Qualifier: Diagnosis of  By: Andrey Campanile MD, LauraLee     DJD (degenerative joint disease)    chronic neck pain   General medical examination 12/03/2010   GERD 03/07/2008   Qualifier: Diagnosis of  By: Andrey Campanile MD, Carrie Mew of this note might be different from the original. Overview:  Qualifier: Diagnosis of  By: Andrey Campanile MD, Raliegh Ip   GERD (gastroesophageal reflux disease)    Hip pain    History of diverticulitis of colon    Hx of diverticulitis of colon    Insomnia    Lateral epicondylitis of right elbow    Leukocytopenia    Migraine    Migraine without aura 03/07/2008   Qualifier: Diagnosis of  By: Andrey Campanile MD, LauraLee     Neck pain, chronic    NECK PAIN, CHRONIC 03/07/2008   Qualifier: Diagnosis of  By: Andrey Campanile MD, LauraLee     Osteoarthritis    Pain of left thumb    Pain of right sternoclavicular joint    Phantosmia    PONV (postoperative nausea and vomiting)    Routine general medical examination at a health care facility 08/09/2013   Sinusitis    Sternoclavicular joint pain, right 10/15/2019  TMJ (dislocation of temporomandibular joint)    Tremor of both hands 03/08/2020    Medications: Outpatient Encounter Medications as of 06/04/2022  Medication Sig Note   acetaminophen (TYLENOL) 325 MG tablet Take 650 mg by mouth every 6 (six) hours as needed.    albuterol (VENTOLIN HFA) 108 (90 Base) MCG/ACT inhaler Inhale 2 puffs into the lungs every 6 (six) hours as needed for wheezing or shortness of breath.    b complex vitamins capsule Take 1 capsule by mouth 2 (two) times a week.    benzonatate (TESSALON PERLES) 100 MG capsule Take 1-2 capsules (100-200 mg total) by mouth 3 (three) times daily as needed for cough.    calcium citrate-vitamin D (CITRACAL+D) 315-200 MG-UNIT per tablet Take 2 tablets by mouth  daily.    CAPSAICIN EX Apply 1 application. topically daily as needed (muscle pain).    cyclobenzaprine (FLEXERIL) 5 MG tablet Take 5 mg by mouth as needed for muscle spasms.    diphenhydrAMINE (BENADRYL) 25 MG tablet Take 25 mg by mouth daily as needed for itching or allergies.     doxycycline (VIBRA-TABS) 100 MG tablet Take 1 tablet (100 mg total) by mouth 2 (two) times daily.    famotidine (PEPCID AC) 10 MG chewable tablet Chew 10 mg by mouth daily as needed for heartburn. 01/27/2022: Twice a day   gabapentin (NEURONTIN) 300 MG capsule Take 2 capsules (600 mg total) by mouth 2 (two) times daily.    HYDROcodone bit-homatropine (HYDROMET) 5-1.5 MG/5ML syrup Take 5 mLs by mouth every 6 (six) hours as needed for cough.    hyoscyamine (LEVSIN SL) 0.125 MG SL tablet Place 1 tablet (0.125 mg total) under the tongue every 4 (four) hours as needed.    ibuprofen (ADVIL) 200 MG tablet Take 200 mg by mouth every 6 (six) hours as needed.    loratadine (CLARITIN) 10 MG tablet Take 10 mg by mouth daily.    Misc Natural Products (RELIZEN) TABS Take by mouth. Supplement otc    Multiple Vitamins-Minerals (ONE-A-DAY WOMENS 50 PLUS) TABS Take 1 tablet by mouth daily.    primidone (MYSOLINE) 50 MG tablet Take 1 tablet (50 mg total) by mouth at bedtime.    Psyllium (METAMUCIL PO) Take 0.5 fluid ounces by mouth daily.    SUMAtriptan (IMITREX) 50 MG tablet Take 1 tablet (50 mg total) by mouth every 2 (two) hours as needed for migraine. May repeat in 2 hours if headache persists or recurs.    Ubrogepant (UBRELVY) 100 MG TABS Take 100 mg by mouth as needed.    WELLBUTRIN XL 300 MG 24 hr tablet Take 1 tablet (300 mg total) by mouth daily.    zolpidem (AMBIEN) 5 MG tablet Take 1 tablet (5 mg total) by mouth at bedtime as needed. for sleep    No facility-administered encounter medications on file as of 06/04/2022.    Allergies: Allergies  Allergen Reactions   Penicillins Anaphylaxis   Celebrex [Celecoxib]      Headaches   Shingrix [Zoster Vac Recomb Adjuvanted]     Fever, myalgia, local redness    Family History: Family History  Problem Relation Age of Onset   Arthritis Other    Breast cancer Other    Coronary artery disease Maternal Grandmother    Breast cancer Maternal Grandmother        carcinoid tumor in abd   Hypertension Other    Diverticulitis Mother    Arthritis Mother    Pancreatic cancer Other  Observations/Objective:   No acute distress.  Alert and oriented.  Speech fluent and not dysarthric.  Language intact.     Follow Up Instructions:    -I discussed the assessment and treatment plan with the patient. The patient was provided an opportunity to ask questions and all were answered. The patient agreed with the plan and demonstrated an understanding of the instructions.   The patient was advised to call back or seek an in-person evaluation if the symptoms worsen or if the condition fails to improve as anticipated.   Cira Servant, DO

## 2022-06-04 ENCOUNTER — Telehealth (INDEPENDENT_AMBULATORY_CARE_PROVIDER_SITE_OTHER): Payer: PRIVATE HEALTH INSURANCE | Admitting: Neurology

## 2022-06-04 ENCOUNTER — Encounter: Payer: Self-pay | Admitting: Neurology

## 2022-06-04 DIAGNOSIS — G250 Essential tremor: Secondary | ICD-10-CM | POA: Diagnosis not present

## 2022-06-04 DIAGNOSIS — G43001 Migraine without aura, not intractable, with status migrainosus: Secondary | ICD-10-CM

## 2022-06-04 NOTE — Patient Instructions (Addendum)
Restart primidone when able - 1/2 tablet at bedtime for a week, then 1 tablet at bedtime Ubrelvy as needed FOllow up 9 months

## 2022-06-05 ENCOUNTER — Encounter: Payer: Self-pay | Admitting: Family Medicine

## 2022-06-05 ENCOUNTER — Other Ambulatory Visit: Payer: Self-pay | Admitting: Family Medicine

## 2022-06-05 MED ORDER — AZITHROMYCIN 250 MG PO TABS: ORAL_TABLET | ORAL | 0 refills | Status: AC

## 2022-06-21 ENCOUNTER — Encounter: Payer: Self-pay | Admitting: Family

## 2022-07-21 ENCOUNTER — Encounter: Payer: Self-pay | Admitting: Podiatry

## 2022-07-22 ENCOUNTER — Other Ambulatory Visit (HOSPITAL_BASED_OUTPATIENT_CLINIC_OR_DEPARTMENT_OTHER): Payer: Self-pay

## 2022-07-22 ENCOUNTER — Telehealth: Payer: Self-pay | Admitting: Podiatry

## 2022-07-22 ENCOUNTER — Ambulatory Visit: Payer: PRIVATE HEALTH INSURANCE | Admitting: Family

## 2022-07-22 VITALS — BP 123/84 | HR 76 | Temp 97.7°F | Resp 16 | Wt 174.0 lb

## 2022-07-22 DIAGNOSIS — F419 Anxiety disorder, unspecified: Secondary | ICD-10-CM | POA: Diagnosis not present

## 2022-07-22 DIAGNOSIS — F32A Depression, unspecified: Secondary | ICD-10-CM | POA: Diagnosis not present

## 2022-07-22 DIAGNOSIS — R002 Palpitations: Secondary | ICD-10-CM | POA: Diagnosis not present

## 2022-07-22 LAB — COMPREHENSIVE METABOLIC PANEL
ALT: 25 U/L (ref 0–35)
AST: 23 U/L (ref 0–37)
Albumin: 4 g/dL (ref 3.5–5.2)
Alkaline Phosphatase: 82 U/L (ref 39–117)
BUN: 17 mg/dL (ref 6–23)
CO2: 29 mEq/L (ref 19–32)
Calcium: 9.3 mg/dL (ref 8.4–10.5)
Chloride: 103 mEq/L (ref 96–112)
Creatinine, Ser: 0.97 mg/dL (ref 0.40–1.20)
GFR: 64.97 mL/min (ref >60.00)
Glucose, Bld: 80 mg/dL (ref 70–99)
Potassium: 4.5 mEq/L (ref 3.5–5.1)
Sodium: 139 mEq/L (ref 135–145)
Total Bilirubin: 0.4 mg/dL (ref 0.2–1.2)
Total Protein: 6.5 g/dL (ref 6.0–8.3)

## 2022-07-22 LAB — CBC WITH DIFFERENTIAL/PLATELET
Basophils Absolute: 0 10*3/uL (ref 0.0–0.1)
Basophils Relative: 0.9 % (ref 0.0–3.0)
Eosinophils Absolute: 0.1 10*3/uL (ref 0.0–0.7)
Eosinophils Relative: 1 % (ref 0.0–5.0)
HCT: 39.9 % (ref 36.0–46.0)
Hemoglobin: 13 g/dL (ref 12.0–15.0)
Lymphocytes Relative: 24 % (ref 12.0–46.0)
Lymphs Abs: 1.3 10*3/uL (ref 0.7–4.0)
MCHC: 32.7 g/dL (ref 30.0–36.0)
MCV: 89 fl (ref 78.0–100.0)
Monocytes Absolute: 0.6 10*3/uL (ref 0.1–1.0)
Monocytes Relative: 11.1 % (ref 3.0–12.0)
Neutro Abs: 3.5 10*3/uL (ref 1.4–7.7)
Neutrophils Relative %: 63 % (ref 43.0–77.0)
Platelets: 203 10*3/uL (ref 150.0–400.0)
RBC: 4.48 Mil/uL (ref 3.87–5.11)
RDW: 13.6 % (ref 11.5–15.5)
WBC: 5.5 10*3/uL (ref 4.0–10.5)

## 2022-07-22 LAB — TSH: TSH: 2.51 u[IU]/mL (ref 0.35–5.50)

## 2022-07-22 MED ORDER — BUSPIRONE HCL 7.5 MG PO TABS
7.5000 mg | ORAL_TABLET | Freq: Two times a day (BID) | ORAL | 2 refills | Status: DC
  Filled 2022-07-22: qty 60, 30d supply, fill #0

## 2022-07-22 MED ORDER — WELLBUTRIN XL 300 MG PO TB24: 300.0000 mg | ORAL_TABLET | Freq: Every day | ORAL | 1 refills | Status: DC

## 2022-07-22 NOTE — Telephone Encounter (Signed)
Patient has been scheduled with Dr. Paulla Dolly on 2/19

## 2022-07-22 NOTE — Progress Notes (Signed)
Subjective:   By signing my name below, I, Shehryar Baig, attest that this documentation has been prepared under the direction and in the presence of [name], NP. 07/22/2022   Patient ID: Pamela Sanders, female    DOB: 02-Apr-1966, 57 y.o.   MRN: [id number]  Chief Complaint  Patient presents with   Anxiety    Complains of increased anxiety    HPI Patient is in today for a office visit.   Anxiety: She complains of increased anxiety. Her workload increases at irregular intervals which is causing her stress. Her job is secure but it still gives her anxiety. She has difficult communications with her boss which is causing her stress. She has tried sitting down with her boss asking for more communication but he continues having sparse communication. When her anxiety increases she feels her heart start fluttering. She had a cardiac stress test in the past and found on new issues. She has taken an SSRI in the past but stopped due to not feeling good while taking them and prefers not to take another one. She is currently taking 300 mg Wellbutrin XR daily PO. She has not tried counseling and is interested in seeing someone.   Dry mouth: She reports feeling frequent thirst. She also has dry mouth. Her last A1c was stable and had not increased form the previous one. She has seen a rheumatologist in the past for these symptoms and found she was negative for sjogren's syndrome.   Lab Results  Component Value Date   HGBA1C 5.9 05/28/2022   Heat intolerance: She continues having heat intolerance. She reports going to a wedding in [place] in the past and vomited and had nausea due to the heat.    Past Medical History:  Diagnosis Date   Allergic rhinitis 03/07/2008   Qualifier: Diagnosis of  By: Redmond Pulling MD, Karren Cobble of this note might be different from the original. Overview:  Qualifier: Diagnosis of  By: Redmond Pulling MD, LauraLee   Chest pain 12/31/2019   Chest tightness 01/29/2020    Chronic inflammatory arthritis 03/07/2008   Qualifier: Diagnosis of  By: Redmond Pulling MD, Karren Cobble of this note might be different from the original. Overview:  Qualifier: Diagnosis of  By: Redmond Pulling MD, LauraLee   Depression    Depression (emotion)    DIVERTICULITIS, HX OF 03/07/2008   Qualifier: Diagnosis of  By: Redmond Pulling MD, LauraLee     DJD (degenerative joint disease)    chronic neck pain   General medical examination 12/03/2010   GERD 03/07/2008   Qualifier: Diagnosis of  By: Redmond Pulling MD, Karren Cobble of this note might be different from the original. Overview:  Qualifier: Diagnosis of  By: Redmond Pulling MD, Frann Rider   GERD (gastroesophageal reflux disease)    Hip pain    History of diverticulitis of colon    Hx of diverticulitis of colon    Insomnia    Lateral epicondylitis of right elbow    Leukocytopenia    Migraine    Migraine without aura 03/07/2008   Qualifier: Diagnosis of  By: Redmond Pulling MD, LauraLee     Neck pain, chronic    NECK PAIN, CHRONIC 03/07/2008   Qualifier: Diagnosis of  By: Redmond Pulling MD, LauraLee     Osteoarthritis    Pain of left thumb    Pain of right sternoclavicular joint    Phantosmia    PONV (postoperative nausea and vomiting)    Routine general medical  examination at a health care facility 08/09/2013   Sinusitis    Sternoclavicular joint pain, right 10/15/2019   TMJ (dislocation of temporomandibular joint)    Tremor of both hands 03/08/2020    Past Surgical History:  Procedure Laterality Date   APPENDECTOMY     CERVICAL ABLATION  05/2021   CERVICAL DISCECTOMY     Dr. Harl Bowie 11/2009- [facility]   COLONOSCOPY  08/03/2014   ESOPHAGOGASTRODUODENOSCOPY     Says it was normal but maybe a peptic ulcer. Early 2000's possibly [facility]   HEMORROIDECTOMY     LAPAROSCOPIC BILATERAL SALPINGO OOPHERECTOMY N/A 10/15/2021   Procedure: LAPAROSCOPIC BILATERAL SALPINGOOPHERECTOMY AND REMOVAL OF UTERINE MASS/PELVIC WASHINGS;  Surgeon: Azucena Fallen, MD;  Location: [facility];  Service: Gynecology;  Laterality: N/A;   OVARIAN CYST SURGERY     abdominal surgery for ovarian cyst   TISSUE GRAFT  02/07/2015   pt reports gum graft for receeding gums--Dr Geralynn Ochs   TMJ ARTHROSCOPY     TMJ surgery    Family History  Problem Relation Age of Onset   Arthritis Other    Breast cancer Other    Coronary artery disease Maternal Grandmother    Breast cancer Maternal Grandmother        carcinoid tumor in abd   Hypertension Other    Diverticulitis Mother    Arthritis Mother    Pancreatic cancer Other     Social History   Socioeconomic History   Marital status: Married    Spouse name: [name]   Number of children: 0   Years of education: Not on file   Highest education level: Doctorate  Occupational History   Occupation: Probation officer: [employer]  Tobacco Use   Smoking status: Never   Smokeless tobacco: Never  Vaping Use   Vaping Use: Never used  Substance and Sexual Activity   Alcohol use: Yes    Alcohol/week: 0.0 standard drinks of alcohol    Comment: 2 drinks weekly, wine   Drug use: No   Sexual activity: Not on file  Other Topics Concern   Not on file  Social History Narrative   Domestic Partner   no children   She grew up in [place], [place]    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 She lives with her wife in a 2 story home. She drinks 1-2 cups of coffee a day and 1-2 glasses of tea. She exercises regularly.   Social Determinants of Health   Financial Resource Strain: Not on file  Food Insecurity: Not on file  Transportation Needs: Not on file  Physical Activity: Not on file  Stress: Not on file  Social Connections: Not on file  Intimate Partner Violence: Not on file    Outpatient Medications Prior to Visit  Medication Sig Dispense Refill    acetaminophen (TYLENOL) 325 MG tablet Take 650 mg by mouth every 6 (six) hours as needed.     b complex vitamins capsule Take 1 capsule by mouth 2 (two) times a week.     calcium citrate-vitamin D (CITRACAL+D) 315-200 MG-UNIT per tablet Take 2 tablets by mouth daily.     CAPSAICIN EX Apply 1 application. topically daily as needed (muscle pain).     cyclobenzaprine (  FLEXERIL) 5 MG tablet Take 5 mg by mouth as needed for muscle spasms.     diphenhydrAMINE (BENADRYL) 25 MG tablet Take 25 mg by mouth daily as needed for itching or allergies.      famotidine (PEPCID AC) 10 MG chewable tablet Chew 10 mg by mouth daily as needed for heartburn.     gabapentin (NEURONTIN) 300 MG capsule Take 2 capsules (600 mg total) by mouth 2 (two) times daily. 360 capsule 1   hyoscyamine (LEVSIN SL) 0.125 MG SL tablet Place 1 tablet (0.125 mg total) under the tongue every 4 (four) hours as needed. 30 tablet 0   ibuprofen (ADVIL) 200 MG tablet Take 200 mg by mouth every 6 (six) hours as needed.     loratadine (CLARITIN) 10 MG tablet Take 10 mg by mouth daily.     Multiple Vitamins-Minerals (ONE-A-DAY WOMENS 50 PLUS) TABS Take 1 tablet by mouth daily.     primidone (MYSOLINE) 50 MG tablet Take 1 tablet (50 mg total) by mouth at bedtime. 30 tablet 5   Psyllium (METAMUCIL PO) Take 0.5 fluid ounces by mouth daily.     Ubrogepant (UBRELVY) 100 MG TABS Take 100 mg by mouth as needed. 16 tablet 5   zolpidem (AMBIEN) 5 MG tablet Take 1 tablet (5 mg total) by mouth at bedtime as needed. for sleep 30 tablet 2   WELLBUTRIN XL 300 MG 24 hr tablet Take 1 tablet (300 mg total) by mouth daily. 90 tablet 1   albuterol (VENTOLIN HFA) 108 (90 Base) MCG/ACT inhaler Inhale 2 puffs into the lungs every 6 (six) hours as needed for wheezing or shortness of breath. 6.7 g 0   benzonatate (TESSALON PERLES) 100 MG capsule Take 1-2 capsules (100-200 mg total) by mouth 3 (three) times daily as needed for cough. 40 capsule 1   doxycycline  (VIBRA-TABS) 100 MG tablet Take 1 tablet (100 mg total) by mouth 2 (two) times daily. 20 tablet 0   HYDROcodone bit-homatropine (HYDROMET) 5-1.5 MG/5ML syrup Take 5 mLs by mouth every 6 (six) hours as needed for cough. 120 mL 0   Misc Natural Products (RELIZEN) TABS Take by mouth. Supplement otc     No facility-administered medications prior to visit.    Allergies  Allergen Reactions   Penicillins Anaphylaxis   Celebrex [Celecoxib]     Headaches   Shingrix [Zoster Vac Recomb Adjuvanted]     Fever, myalgia, local redness    Review of Systems  Constitutional:        (+)increased thirst (+)heat intolerance  HENT:         (+)dry mouth  Cardiovascular:  Positive for palpitations (when anxiety increases).  Psychiatric/Behavioral:  The patient is nervous/anxious.        Objective:    Physical Exam Constitutional:      General: She is not in acute distress.    Appearance: Normal appearance.  HENT:     Head: Normocephalic and atraumatic.     Right Ear: External ear normal.     Left Ear: External ear normal.  Eyes:     Extraocular Movements: Extraocular movements intact.     Pupils: Pupils are equal, round, and reactive to light.  Cardiovascular:     Rate and Rhythm: Normal rate and regular rhythm.     Pulses: Normal pulses.     Heart sounds: Normal heart sounds. No murmur heard.    No gallop.  Pulmonary:     Effort: Pulmonary effort is normal. No respiratory  distress.     Breath sounds: Normal breath sounds. No wheezing or rales.  Skin:    General: Skin is warm and dry.  Neurological:     Mental Status: She is alert and oriented to person, place, and time.  Psychiatric:        Judgment: Judgment normal.     BP 123/84 (BP Location: Right Arm, Patient Position: Sitting, Cuff Size: Small)   Pulse 76   Temp 97.7 F (36.5 C) (Oral)   Resp 16   Wt 174 lb (78.9 kg)   SpO2 100%   BMI 27.25 kg/m  Wt Readings from Last 3 Encounters:  07/22/22 174 lb (78.9 kg)   05/28/22 170 lb (77.1 kg)  02/04/22 174 lb (78.9 kg)       Assessment & Plan:  Palpitations Assessment & Plan: EKG tracing is personally reviewed.  EKG notes NSR.  No acute changes.  Palpitations are likely due to anxiety.  Check labs as below.    Orders: -     Comprehensive metabolic panel -     TSH -     CBC with Differential/Platelet -     EKG 12-Lead  Anxiety Assessment & Plan: Uncontrolled. Encouraged her to establish with a counselor. Also suggested addition of buspar 7.57m bid along with her Wellbutrin. She has not tolerated SSRI's well in the past. I encouraged her to look into counseling as well.    Depression, unspecified depression type Assessment & Plan: Stable on Branded Wellbutrin xl 3021m She has not found the generic versions effective in the past.    Other orders -     busPIRone HCl; Take 1 tablet (7.5 mg total) by mouth 2 (two) times daily.  Dispense: 60 tablet; Refill: 2 -     Wellbutrin XL; Take 1 tablet (300 mg total) by mouth daily.  Dispense: 90 tablet; Refill: 1    I, MeNance PearNP, personally preformed the services described in this documentation.  All medical record entries made by the scribe were at my direction and in my presence.  I have reviewed the chart and discharge instructions (if applicable) and agree that the record reflects my personal performance and is accurate and complete. 07/22/2022   I,Shehryar Baig,acting as a scribe for MeNance PearNP.,have documented all relevant documentation on the behalf of MeNance PearNP,as directed by  MeNance PearNP while in the presence of MeNance PearNP.   MeNance PearNP

## 2022-07-22 NOTE — Telephone Encounter (Signed)
Left message for pt that we did have some availability for Dr Paulla Dolly tomorrow and to call if interested.

## 2022-07-22 NOTE — Assessment & Plan Note (Signed)
EKG tracing is personally reviewed.  EKG notes NSR.  No acute changes.  Palpitations are likely due to anxiety.  Check labs as below.

## 2022-07-22 NOTE — Assessment & Plan Note (Signed)
Stable on Branded Wellbutrin xl 381m. She has not found the generic versions effective in the past.

## 2022-07-22 NOTE — Assessment & Plan Note (Addendum)
Uncontrolled. Encouraged her to establish with a counselor. Also suggested addition of buspar 7.37m bid along with her Wellbutrin. She has not tolerated SSRI's well in the past. I encouraged her to look into counseling as well.

## 2022-07-23 ENCOUNTER — Ambulatory Visit: Payer: PRIVATE HEALTH INSURANCE | Admitting: Podiatry

## 2022-07-23 ENCOUNTER — Encounter: Payer: Self-pay | Admitting: Podiatry

## 2022-07-23 DIAGNOSIS — L600 Ingrowing nail: Secondary | ICD-10-CM

## 2022-07-23 NOTE — Patient Instructions (Signed)

## 2022-07-23 NOTE — Progress Notes (Signed)
Subjective:   Patient ID: Pamela Sanders, female   DOB: 57 y.o.   MRN: [id number]   HPI Patient presents with very painful ingrown toenail right big toe medial border that is been present for a number of days and is very hard to sleep with neurovascular status   ROS      Objective:  Physical Exam  Intact incurvated medial border of the right big toe painful when pressed with an abnormal position of the nailbed on the medial side with no current drainage mild erythema local no proximal edema erythema drainage noted     Assessment:  Ingrown to nail deformity right hallux with pain     Plan:  H&P reviewed condition recommended correction explained procedure risk and patient wants surgery.  Today I allowed her to read and signed consent form I infiltrated the right big toe 60 mg like Marcaine mixture sterile prep done and using sterile instrumentation remove the medial border exposed matrix applied phenol 3 applications 30 seconds followed by alcohol lavage sterile dressing gave instructions on soaks wear dressing 24 hours take it off earlier if throbbing were to occur with all questions answered today

## 2022-07-24 ENCOUNTER — Encounter: Payer: Self-pay | Admitting: Podiatry

## 2022-07-27 ENCOUNTER — Ambulatory Visit: Payer: PRIVATE HEALTH INSURANCE | Admitting: Podiatry

## 2022-08-09 ENCOUNTER — Encounter: Payer: Self-pay | Admitting: Family

## 2022-08-10 MED ORDER — BUSPIRONE HCL 7.5 MG PO TABS: 7.5000 mg | ORAL_TABLET | Freq: Two times a day (BID) | ORAL | 1 refills | Status: DC

## 2022-08-13 ENCOUNTER — Ambulatory Visit: Payer: PRIVATE HEALTH INSURANCE | Admitting: Podiatry

## 2022-08-23 ENCOUNTER — Encounter: Payer: Self-pay | Admitting: Podiatry

## 2022-08-24 ENCOUNTER — Encounter: Payer: Self-pay | Admitting: Podiatry

## 2022-08-24 ENCOUNTER — Ambulatory Visit: Payer: PRIVATE HEALTH INSURANCE | Admitting: Podiatry

## 2022-08-24 DIAGNOSIS — L03031 Cellulitis of right toe: Secondary | ICD-10-CM | POA: Diagnosis not present

## 2022-08-24 MED ORDER — DOXYCYCLINE HYCLATE 100 MG PO TABS: 100.0000 mg | ORAL_TABLET | Freq: Two times a day (BID) | ORAL | 1 refills | Status: DC

## 2022-08-24 NOTE — Progress Notes (Signed)
Subjective:   Patient ID: Pamela Sanders, female   DOB: 57 y.o.   MRN: [id number]   HPI Patient presents with redness of the right hallux nail stating it was doing great and then this started the last couple days   ROS      Objective:  Physical Exam  Neurovascular status intact with redness around the medial side of the right big toe localized no proximal edema erythema drainage beyond this point mild tenderness     Assessment:  Low-grade paronychia of the right hallux medial side     Plan:  Reviewed condition recommended antibiotic as precautionary measure but I do think this should hopefully heal uneventfully and if it were to get further red or a problem or to occur then at 1 point were getting need to go back in and release some tissue from that side.  Continue soaking it bed and use it without

## 2022-09-10 ENCOUNTER — Encounter: Payer: Self-pay | Admitting: Family

## 2022-09-16 ENCOUNTER — Telehealth: Payer: PRIVATE HEALTH INSURANCE | Admitting: Family

## 2022-09-16 DIAGNOSIS — F419 Anxiety disorder, unspecified: Secondary | ICD-10-CM | POA: Diagnosis not present

## 2022-09-16 MED ORDER — BUSPIRONE HCL 5 MG PO TABS: 5.0000 mg | ORAL_TABLET | Freq: Two times a day (BID) | ORAL | 0 refills | Status: DC

## 2022-09-16 NOTE — Progress Notes (Signed)
MyChart Video Visit    Virtual Visit via Video Note   This visit type was conducted due to national recommendations for restrictions regarding the COVID-19 Pandemic (e.g. social distancing) in an effort to limit this patient's exposure and mitigate transmission in our community. This patient is at least at moderate risk for complications without adequate follow up. This format is felt to be most appropriate for this patient at this time. Physical exam was limited by quality of the video and audio technology used for the visit. CMA was able to get the patient set up on a video visit.  Patient location: Work  Provider location: Office  I discussed the limitations of evaluation and management by telemedicine and the availability of in person appointments. The patient expressed understanding and agreed to proceed.  Visit Date: 09/16/2022  Today's healthcare provider: Lemont Fillers, NP     Subjective:    Patient ID: Pamela Sanders, female    DOB: 1966-04-05, 57 y.o.   MRN: [id number]  Chief Complaint  Patient presents with   Anxiety    Patient reports having side effects with Buspar, stopped taking last week.    HPI Patient is in today for a video visit.   Buspirone: She discontinued taking 7.5 mg 2x daily PO buspirone about 5 days ago. The medication relieved some of her anxiety quickly, but she still had some anxious fluttery feelings, increased tiredness, and dizziness while taking it. Since discontinuing the buspar, she noted recurrence of her anxious "heart fluttery" feelings.    Past Medical History:  Diagnosis Date   Allergic rhinitis 03/07/2008   Qualifier: Diagnosis of  By: Andrey Campanile MD, Carrie Mew of this note might be different from the original. Overview:  Qualifier: Diagnosis of  By: Andrey Campanile MD, LauraLee   Chest pain 12/31/2019   Chest tightness 01/29/2020   Chronic inflammatory arthritis 03/07/2008   Qualifier: Diagnosis of  By: Andrey Campanile MD, Carrie Mew of this note might be different from the original. Overview:  Qualifier: Diagnosis of  By: Andrey Campanile MD, LauraLee   Depression    Depression (emotion)    DIVERTICULITIS, HX OF 03/07/2008   Qualifier: Diagnosis of  By: Andrey Campanile MD, LauraLee     DJD (degenerative joint disease)    chronic neck pain   General medical examination 12/03/2010   GERD 03/07/2008   Qualifier: Diagnosis of  By: Andrey Campanile MD, Carrie Mew of this note might be different from the original. Overview:  Qualifier: Diagnosis of  By: Andrey Campanile MD, Raliegh Ip   GERD (gastroesophageal reflux disease)    Hip pain    History of diverticulitis of colon    Hx of diverticulitis of colon    Insomnia    Lateral epicondylitis of right elbow    Leukocytopenia    Migraine    Migraine without aura 03/07/2008   Qualifier: Diagnosis of  By: Andrey Campanile MD, LauraLee     Neck pain, chronic    NECK PAIN, CHRONIC 03/07/2008   Qualifier: Diagnosis of  By: Andrey Campanile MD, LauraLee     Osteoarthritis    Pain of left thumb    Pain of right sternoclavicular joint    Phantosmia    PONV (postoperative nausea and vomiting)    Routine general medical examination at a health care facility 08/09/2013   Sinusitis    Sternoclavicular joint pain, right 10/15/2019   TMJ (dislocation of temporomandibular joint)    Tremor of both hands 03/08/2020  Past Surgical History:  Procedure Laterality Date   APPENDECTOMY     CERVICAL ABLATION  05/2021   CERVICAL DISCECTOMY     Dr. Wyline Mood 11/2009- [facility]   COLONOSCOPY  08/03/2014   ESOPHAGOGASTRODUODENOSCOPY     Says it was normal but maybe a peptic ulcer. Early 2000's possibly [facility]   HEMORROIDECTOMY     LAPAROSCOPIC BILATERAL SALPINGO OOPHERECTOMY N/A 10/15/2021   Procedure: LAPAROSCOPIC BILATERAL SALPINGOOPHERECTOMY AND REMOVAL OF UTERINE MASS/PELVIC WASHINGS;  Surgeon: Shea Evans, MD;  Location: [facility] OR;  Service: Gynecology;  Laterality: N/A;   OVARIAN CYST SURGERY     abdominal surgery for  ovarian cyst   TISSUE GRAFT  02/07/2015   pt reports gum graft for receeding gums--Dr Lucky Cowboy   TMJ ARTHROSCOPY     TMJ surgery    Family History  Problem Relation Age of Onset   Arthritis Other    Breast cancer Other    Coronary artery disease Maternal Grandmother    Breast cancer Maternal Grandmother        carcinoid tumor in abd   Hypertension Other    Diverticulitis Mother    Arthritis Mother    Pancreatic cancer Other     Social History   Socioeconomic History   Marital status: Married    Spouse name: Vernona Rieger   Number of children: 0   Years of education: Not on file   Highest education level: Doctorate  Occupational History   Occupation: Nurse, mental health: [employer]  Tobacco Use   Smoking status: Never   Smokeless tobacco: Never  Vaping Use   Vaping Use: Never used  Substance and Sexual Activity   Alcohol use: Yes    Alcohol/week: 0.0 standard drinks of alcohol    Comment: 2 drinks weekly, wine   Drug use: No   Sexual activity: Not on file  Other Topics Concern   Not on file  Social History Narrative   Domestic Partner   no children   She grew up in [place], [place]    Family moved to [place] when she was in [employer]   Occupation:lab research / Personal assistant     Regular Exercise:  3 x weekly   Caffeine Use:  2 cups coffee daily, 1 tea      Patient is right-handed. She lives with her wife in a 2 story home. She drinks 1-2 cups of coffee a day and 1-2 glasses of tea. She exercises regularly.   Social Determinants of Health   Financial Resource Strain: Not on file  Food Insecurity: Not on file  Transportation Needs: Not on file  Physical Activity: Not on file  Stress: Not on file  Social Connections: Not on file  Intimate Partner Violence: Not on file    Outpatient Medications Prior to Visit  Medication Sig Dispense Refill   acetaminophen (TYLENOL) 325 MG tablet Take 650 mg by mouth every 6 (six) hours as needed.     b complex  vitamins capsule Take 1 capsule by mouth 2 (two) times a week.     calcium citrate-vitamin D (CITRACAL+D) 315-200 MG-UNIT per tablet Take 2 tablets by mouth daily.     CAPSAICIN EX Apply 1 application. topically daily as needed (muscle pain).     cyclobenzaprine (FLEXERIL) 5 MG tablet Take 5 mg by mouth as needed for muscle spasms.     diphenhydrAMINE (BENADRYL) 25 MG tablet Take 25 mg by mouth daily as needed for itching or allergies.  famotidine (PEPCID AC) 10 MG chewable tablet Chew 10 mg by mouth daily as needed for heartburn.     gabapentin (NEURONTIN) 300 MG capsule Take 2 capsules (600 mg total) by mouth 2 (two) times daily. 360 capsule 1   hyoscyamine (LEVSIN SL) 0.125 MG SL tablet Place 1 tablet (0.125 mg total) under the tongue every 4 (four) hours as needed. 30 tablet 0   ibuprofen (ADVIL) 200 MG tablet Take 200 mg by mouth every 6 (six) hours as needed.     loratadine (CLARITIN) 10 MG tablet Take 10 mg by mouth daily.     Multiple Vitamins-Minerals (ONE-A-DAY WOMENS 50 PLUS) TABS Take 1 tablet by mouth daily.     primidone (MYSOLINE) 50 MG tablet Take 1 tablet (50 mg total) by mouth at bedtime. 30 tablet 5   Psyllium (METAMUCIL PO) Take 0.5 fluid ounces by mouth daily.     Ubrogepant (UBRELVY) 100 MG TABS Take 100 mg by mouth as needed. 16 tablet 5   WELLBUTRIN XL 300 MG 24 hr tablet Take 1 tablet (300 mg total) by mouth daily. 90 tablet 1   zolpidem (AMBIEN) 5 MG tablet Take 1 tablet (5 mg total) by mouth at bedtime as needed. for sleep 30 tablet 2   busPIRone (BUSPAR) 7.5 MG tablet Take 1 tablet (7.5 mg total) by mouth 2 (two) times daily. (Patient not taking: Reported on 09/16/2022) 180 tablet 1   doxycycline (VIBRA-TABS) 100 MG tablet Take 1 tablet (100 mg total) by mouth 2 (two) times daily. 20 tablet 1   No facility-administered medications prior to visit.    Allergies  Allergen Reactions   Penicillins Anaphylaxis   Celebrex [Celecoxib]     Headaches   Shingrix [Zoster  Vac Recomb Adjuvanted]     Fever, myalgia, local redness    Review of Systems  Psychiatric/Behavioral:         (+) anxiety       Objective:    Physical Exam  There were no vitals taken for this visit. Wt Readings from Last 3 Encounters:  07/22/22 174 lb (78.9 kg)  05/28/22 170 lb (77.1 kg)  02/04/22 174 lb (78.9 kg)       Gen: Awake, alert, no acute distress Resp: Breathing is even and non-labored Psych: calm/pleasant demeanor Neuro: Alert and Oriented x 3, + facial symmetry, speech is clear.  Assessment & Plan:  Anxiety Assessment & Plan: Had improved control on buspar 7.5 but found herself too drowsy.  Will retrial with 5mg  twice daily.  She feels that one of her stressors is the current political environment.  She does watch a lot of the news.  We discussed trying to cut back on her news viewing and just do a quick summary podcast of the news once a day to stay informed but hopefully not stress out about current events.        I discussed the assessment and treatment plan with the patient. The patient was provided an opportunity to ask questions and all were answered. The patient agreed with the plan and demonstrated an understanding of the instructions.   The patient was advised to call back or seek an in-person evaluation if the symptoms worsen or if the condition fails to improve as anticipated.  Lemont Fillers, NP [facility] [phone] (phone) [phone] (fax)  [facility]    Mercer Pod as a scribe for Lemont Fillers, NP.,have documented all relevant  documentation on the behalf of Lemont FillersMelissa S O'Sullivan, NP,as directed by  Lemont FillersMelissa S O'Sullivan, NP while in the presence of Lemont FillersMelissa S O'Sullivan, NP.

## 2022-09-16 NOTE — Assessment & Plan Note (Addendum)
Had improved control on buspar 7.5 but found herself too drowsy.  Will retrial with 5mg  twice daily.  She feels that one of her stressors is the current political environment.  She does watch a lot of the news.  We discussed trying to cut back on her news viewing and just do a quick summary podcast of the news once a day to stay informed but hopefully not stress out about current events.  She will reach out to me in a few weeks to let me know how she is doing on the lower dose of buspar.

## 2022-09-21 ENCOUNTER — Encounter: Payer: Self-pay | Admitting: *Deleted

## 2022-09-24 HISTORY — PX: BLEPHAROPLASTY: SUR158

## 2022-09-27 ENCOUNTER — Encounter: Payer: Self-pay | Admitting: Family

## 2022-10-11 ENCOUNTER — Encounter: Payer: Self-pay | Admitting: Family

## 2022-10-12 MED ORDER — BUSPIRONE HCL 5 MG PO TABS: 5.0000 mg | ORAL_TABLET | Freq: Two times a day (BID) | ORAL | 1 refills | Status: DC

## 2022-10-13 ENCOUNTER — Other Ambulatory Visit: Payer: Self-pay | Admitting: Neurology

## 2022-10-19 ENCOUNTER — Ambulatory Visit: Payer: PRIVATE HEALTH INSURANCE | Admitting: Family

## 2022-10-19 ENCOUNTER — Encounter: Payer: Self-pay | Admitting: Family

## 2022-10-19 VITALS — BP 103/74 | HR 87 | Temp 98.1°F | Resp 16 | Wt 176.0 lb

## 2022-10-19 DIAGNOSIS — F419 Anxiety disorder, unspecified: Secondary | ICD-10-CM | POA: Diagnosis not present

## 2022-10-19 NOTE — Assessment & Plan Note (Signed)
Much improved. She is tolerating buspar 5mg  bid without side effects. Continue same.

## 2022-10-19 NOTE — Progress Notes (Signed)
Subjective:   By signing my name below, I, Shehryar Baig, attest that this documentation has been prepared under the direction and in the presence of Sandford Craze, NP. 10/19/2022   Patient ID: Pamela Sanders, female    DOB: 29-Jun-1965, 57 y.o.   MRN: [id number]  Chief Complaint  Patient presents with   Anxiety    Here for follow up, "feeling better after dose decrease"    Anxiety Patient reports no nervous/anxious behavior.     Patient is in today for a follow up visit.   Anxiety: Her Buspar was decreased to 5 mg 2x daily PO due to feeling dizziness throughout the day. She notes her jittery feeling resolved after starting 5 mg 2x daily.   Blepharoplasty: She reports completing her blepharoplasty procedure and reports no new issues since recovering and finds improvement in her vision.    Past Medical History:  Diagnosis Date   Allergic rhinitis 03/07/2008   Qualifier: Diagnosis of  By: Andrey Campanile MD, Carrie Mew of this note might be different from the original. Overview:  Qualifier: Diagnosis of  By: Andrey Campanile MD, LauraLee   Chest pain 12/31/2019   Chest tightness 01/29/2020   Chronic inflammatory arthritis 03/07/2008   Qualifier: Diagnosis of  By: Andrey Campanile MD, Carrie Mew of this note might be different from the original. Overview:  Qualifier: Diagnosis of  By: Andrey Campanile MD, LauraLee   Depression    Depression (emotion)    DIVERTICULITIS, HX OF 03/07/2008   Qualifier: Diagnosis of  By: Andrey Campanile MD, LauraLee     DJD (degenerative joint disease)    chronic neck pain   General medical examination 12/03/2010   GERD 03/07/2008   Qualifier: Diagnosis of  By: Andrey Campanile MD, Carrie Mew of this note might be different from the original. Overview:  Qualifier: Diagnosis of  By: Andrey Campanile MD, Raliegh Ip   GERD (gastroesophageal reflux disease)    Hip pain    History of diverticulitis of colon    Hx of diverticulitis of colon    Insomnia    Lateral epicondylitis of  right elbow    Leukocytopenia    Migraine    Migraine without aura 03/07/2008   Qualifier: Diagnosis of  By: Andrey Campanile MD, LauraLee     Neck pain, chronic    NECK PAIN, CHRONIC 03/07/2008   Qualifier: Diagnosis of  By: Andrey Campanile MD, LauraLee     Osteoarthritis    Pain of left thumb    Pain of right sternoclavicular joint    Phantosmia    PONV (postoperative nausea and vomiting)    Routine general medical examination at a health care facility 08/09/2013   Sinusitis    Sternoclavicular joint pain, right 10/15/2019   TMJ (dislocation of temporomandibular joint)    Tremor of both hands 03/08/2020    Past Surgical History:  Procedure Laterality Date   APPENDECTOMY     BLEPHAROPLASTY Bilateral 09/24/2022   CERVICAL ABLATION  05/2021   CERVICAL DISCECTOMY     Dr. Wyline Mood 11/2009- [facility]   COLONOSCOPY  08/03/2014   ESOPHAGOGASTRODUODENOSCOPY     Says it was normal but maybe a peptic ulcer. Early 2000's possibly [facility]   HEMORROIDECTOMY     LAPAROSCOPIC BILATERAL SALPINGO OOPHERECTOMY N/A 10/15/2021   Procedure: LAPAROSCOPIC BILATERAL SALPINGOOPHERECTOMY AND REMOVAL OF UTERINE MASS/PELVIC WASHINGS;  Surgeon: Shea Evans, MD;  Location: [facility] OR;  Service: Gynecology;  Laterality: N/A;   OVARIAN CYST SURGERY     abdominal surgery for  ovarian cyst   TISSUE GRAFT  02/07/2015   pt reports gum graft for receeding gums--Dr Lucky Cowboy   TMJ ARTHROSCOPY     TMJ surgery    Family History  Problem Relation Age of Onset   Arthritis Other    Breast cancer Other    Coronary artery disease Maternal Grandmother    Breast cancer Maternal Grandmother        carcinoid tumor in abd   Hypertension Other    Diverticulitis Mother    Arthritis Mother    Pancreatic cancer Other     Social History   Socioeconomic History   Marital status: Married    Spouse name: Vernona Rieger   Number of children: 0   Years of education: Not on file   Highest education level: Doctorate  Occupational History   Occupation:  Nurse, mental health: [employer]  Tobacco Use   Smoking status: Never   Smokeless tobacco: Never  Vaping Use   Vaping Use: Never used  Substance and Sexual Activity   Alcohol use: Yes    Alcohol/week: 0.0 standard drinks of alcohol    Comment: 2 drinks weekly, wine   Drug use: No   Sexual activity: Not on file  Other Topics Concern   Not on file  Social History Narrative   Domestic Partner   no children   She grew up in [place], [place]    Family moved to [place] when she was in [employer]   Occupation:lab research / Personal assistant     Regular Exercise:  3 x weekly   Caffeine Use:  2 cups coffee daily, 1 tea      Patient is right-handed. She lives with her wife in a 2 story home. She drinks 1-2 cups of coffee a day and 1-2 glasses of tea. She exercises regularly.   Social Determinants of Health   Financial Resource Strain: Low Risk  (10/12/2022)   Overall Financial Resource Strain (CARDIA)    Difficulty of Paying Living Expenses: Not hard at all  Food Insecurity: No Food Insecurity (10/12/2022)   Hunger Vital Sign    Worried About Running Out of Food in the Last Year: Never true    Ran Out of Food in the Last Year: Never true  Transportation Needs: No Transportation Needs (10/12/2022)   PRAPARE - Administrator, Civil Service (Medical): No    Lack of Transportation (Non-Medical): No  Physical Activity: Insufficiently Active (10/12/2022)   Exercise Vital Sign    Days of Exercise per Week: 3 days    Minutes of Exercise per Session: 30 min  Stress: Stress Concern Present (10/12/2022)   [employer] of Occupational Health - Occupational Stress Questionnaire    Feeling of Stress : To some extent  Social Connections: Socially Isolated (10/12/2022)   Social Connection and Isolation Panel [NHANES]    Frequency of Communication with Friends and Family: Once a week    Frequency of Social Gatherings with Friends and Family: Once a week    Attends Religious  Services: Never    Database administrator or Organizations: No    Attends Engineer, structural: Not on file    Marital Status: Married  Catering manager Violence: Not on file    Outpatient Medications Prior to Visit  Medication Sig Dispense Refill   acetaminophen (TYLENOL) 325 MG tablet Take 650 mg by mouth every 6 (six) hours as needed.     b complex vitamins  capsule Take 1 capsule by mouth 2 (two) times a week.     busPIRone (BUSPAR) 5 MG tablet Take 1 tablet (5 mg total) by mouth 2 (two) times daily. 180 tablet 1   calcium citrate-vitamin D (CITRACAL+D) 315-200 MG-UNIT per tablet Take 2 tablets by mouth daily.     CAPSAICIN EX Apply 1 application. topically daily as needed (muscle pain).     cyclobenzaprine (FLEXERIL) 5 MG tablet Take 5 mg by mouth as needed for muscle spasms.     diphenhydrAMINE (BENADRYL) 25 MG tablet Take 25 mg by mouth daily as needed for itching or allergies.      famotidine (PEPCID AC) 10 MG chewable tablet Chew 10 mg by mouth daily as needed for heartburn.     gabapentin (NEURONTIN) 300 MG capsule Take 2 capsules (600 mg total) by mouth 2 (two) times daily. 360 capsule 1   hyoscyamine (LEVSIN SL) 0.125 MG SL tablet Place 1 tablet (0.125 mg total) under the tongue every 4 (four) hours as needed. 30 tablet 0   ibuprofen (ADVIL) 200 MG tablet Take 200 mg by mouth every 6 (six) hours as needed.     loratadine (CLARITIN) 10 MG tablet Take 10 mg by mouth daily.     Multiple Vitamins-Minerals (ONE-A-DAY WOMENS 50 PLUS) TABS Take 1 tablet by mouth daily.     primidone (MYSOLINE) 50 MG tablet Take 1 tablet by mouth at bedtime. 30 tablet 5   Psyllium (METAMUCIL PO) Take 0.5 fluid ounces by mouth daily.     Ubrogepant (UBRELVY) 100 MG TABS Take 100 mg by mouth as needed. 16 tablet 5   WELLBUTRIN XL 300 MG 24 hr tablet Take 1 tablet (300 mg total) by mouth daily. 90 tablet 1   zolpidem (AMBIEN) 5 MG tablet Take 1 tablet (5 mg total) by mouth at bedtime as needed. for  sleep 30 tablet 2   No facility-administered medications prior to visit.    Allergies  Allergen Reactions   Penicillins Anaphylaxis   Celebrex [Celecoxib]     Headaches   Shingrix [Zoster Vac Recomb Adjuvanted]     Fever, myalgia, local redness    Review of Systems  Psychiatric/Behavioral:  Negative for depression. The patient is not nervous/anxious.        Objective:    Physical Exam Constitutional:      General: She is not in acute distress.    Appearance: Normal appearance. She is not ill-appearing.  HENT:     Head: Normocephalic and atraumatic.     Right Ear: External ear normal.     Left Ear: External ear normal.  Eyes:     Extraocular Movements: Extraocular movements intact.     Pupils: Pupils are equal, round, and reactive to light.  Cardiovascular:     Rate and Rhythm: Normal rate and regular rhythm.     Heart sounds: Normal heart sounds. No murmur heard.    No gallop.  Pulmonary:     Effort: Pulmonary effort is normal. No respiratory distress.     Breath sounds: Normal breath sounds. No wheezing or rales.  Skin:    General: Skin is warm and dry.  Neurological:     Mental Status: She is alert and oriented to person, place, and time.  Psychiatric:        Judgment: Judgment normal.     BP 103/74 (BP Location: Left Arm, Patient Position: Sitting, Cuff Size: Small)   Pulse 87   Temp 98.1 F (36.7 C) (Oral)  Resp 16   Wt 176 lb (79.8 kg)   SpO2 99%   BMI 27.57 kg/m  Wt Readings from Last 3 Encounters:  10/19/22 176 lb (79.8 kg)  07/22/22 174 lb (78.9 kg)  05/28/22 170 lb (77.1 kg)       Assessment & Plan:  Anxiety Assessment & Plan: Much improved. She is tolerating buspar 5mg  bid without side effects. Continue same.      I, Lemont Fillers, NP, personally preformed the services described in this documentation.  All medical record entries made by the scribe were at my direction and in my presence.  I have reviewed the chart and discharge  instructions (if applicable) and agree that the record reflects my personal performance and is accurate and complete. 10/19/2022   I,Shehryar Baig,acting as a scribe for Lemont Fillers, NP.,have documented all relevant documentation on the behalf of Lemont Fillers, NP,as directed by  Lemont Fillers, NP while in the presence of Lemont Fillers, NP.   Lemont Fillers, NP

## 2022-10-21 ENCOUNTER — Other Ambulatory Visit: Payer: Self-pay | Admitting: Family

## 2022-10-21 ENCOUNTER — Encounter: Payer: Self-pay | Admitting: Family

## 2022-10-22 ENCOUNTER — Telehealth: Payer: Self-pay

## 2022-10-22 NOTE — Telephone Encounter (Signed)
PA initiated via Covermymeds; KEY: [id number]. Awaiting determination.

## 2022-10-27 NOTE — Telephone Encounter (Signed)
PA denied.   Per your health plan's criteria, this drug is covered if you meet the following: (1) There are paid claims or your doctor provides medical records (for example: chart notes) showing you have tried and failed at least two more covered (formulary alternative) drugs within the same drug class (if only one covered drug within the same therapeutic class is available, you must have tried the one covered drug within the same therapeutic class and one additional covered drug; if there are no covered drugs within the same therapeutic class, you must have tried or cannot use two covered drugs). Covered drugs include (a) Brand Viibryd or vilazodone*. (b) Citalopram tablet. (c) Desvenlafaxine ER extended release 24 hr tablet*. (d) Duloxetine. (e) Escitalopram. (f) Fetzima*. (g) Fluoxetine capsule. (h) Mirtazapine, brand Remeron, or brand Remeron SolTab. (i) Paroxetine, paroxetine ER, or brand Paxil suspension*. (j) Sertraline tablet. (k) Spravato 56mg *. (l) Trintellix*. (m) Venlafaxine. The information provided does not show that you meet the criteria listed above. Please speak with your doctor about your choices. Reviewed by: [id number], RPh

## 2022-10-28 MED ORDER — BUPROPION HCL ER (XL) 300 MG PO TB24: 300.0000 mg | ORAL_TABLET | Freq: Every day | ORAL | 1 refills | Status: DC

## 2022-11-06 ENCOUNTER — Other Ambulatory Visit (HOSPITAL_COMMUNITY): Payer: Self-pay

## 2022-11-06 ENCOUNTER — Telehealth: Payer: Self-pay | Admitting: Pharmacy Technician

## 2022-11-06 NOTE — Telephone Encounter (Signed)
Patient Advocate Encounter  Received notification from [employer] that prior authorization for UBRELVY 100MG  is required.   PA submitted on 5.31.24 Key [id number] Status is pending

## 2022-11-12 ENCOUNTER — Ambulatory Visit: Payer: PRIVATE HEALTH INSURANCE | Admitting: Podiatry

## 2022-11-12 ENCOUNTER — Encounter: Payer: Self-pay | Admitting: Podiatry

## 2022-11-12 DIAGNOSIS — M7751 Other enthesopathy of right foot: Secondary | ICD-10-CM | POA: Diagnosis not present

## 2022-11-12 DIAGNOSIS — M7752 Other enthesopathy of left foot: Secondary | ICD-10-CM | POA: Diagnosis not present

## 2022-11-12 MED ORDER — TRIAMCINOLONE ACETONIDE 10 MG/ML IJ SUSP
20.0000 mg | Freq: Once | INTRAMUSCULAR | Status: AC
Start: 2022-11-12 — End: 2022-11-12
  Administered 2022-11-12: 20 mg

## 2022-11-13 NOTE — Progress Notes (Signed)
Subjective:   Patient ID: Pamela Sanders, female   DOB: 57 y.o.   MRN: [id number]   HPI Patient states that she has had reoccurrence of pain in her forefoot bilateral and states it seems to be when she gets up in the morning after periods of sitting and that it at times is sharp and can stop her from doing things    ROS      Objective:  Physical Exam  Neurovascular status intact with pain which seems to be more around the third and fourth metatarsal phalangeal joints over the interspace and I was not today able to palpate any form of nodular or neuroma like symptomatology     Assessment:  Difficult to say between possible neuroma symptoms for capsulitis but due to the way her pain presents I would lean more towards inflammatory condition     Plan:  H&P reviewed and at this point I did do periarticular injections around the third and fourth MPJ bilateral and I advised on support shoes cushioning continuing metatarsal pads and reappoint as needed

## 2022-11-23 LAB — HM MAMMOGRAPHY

## 2022-11-25 ENCOUNTER — Encounter: Payer: Self-pay | Admitting: Family

## 2022-11-29 LAB — HM PAP SMEAR: HPV, high-risk: NEGATIVE

## 2022-12-01 ENCOUNTER — Encounter: Payer: Self-pay | Admitting: Gastroenterology

## 2022-12-01 ENCOUNTER — Other Ambulatory Visit: Payer: Self-pay

## 2022-12-17 NOTE — Telephone Encounter (Signed)
Pharmacy Patient Advocate Encounter  Received notification from [facility] that Prior Authorization for UBRELVY 100MG   has been APPROVED from 11/06/2022 to 11/06/2023.Marland Kitchen  PA #/Case ID/Reference #: [id number]

## 2022-12-22 ENCOUNTER — Encounter: Payer: Self-pay | Admitting: Family

## 2022-12-25 MED ORDER — AZITHROMYCIN 250 MG PO TABS: ORAL_TABLET | ORAL | 0 refills | Status: AC

## 2022-12-25 NOTE — Addendum Note (Signed)
Addended by: Sandford Craze on: 12/25/2022 05:05 PM   Modules accepted: Orders

## 2022-12-25 NOTE — Telephone Encounter (Signed)
Please see the MyChart message reply(ies) for my assessment and plan.  The patient gave consent for this Medical Advice Message and is aware that it may result in a bill to their insurance company as well as the possibility that this may result in a co-payment or deductible. They are an established patient, but are not seeking medical advice exclusively about a problem treated during an in person or video visit in the last 7 days. I did not recommend an in person or video visit within 7 days of my reply.  I spent a total of 5 minutes cumulative provider time within 7 days through [employer].  Nance Pear, NP

## 2022-12-28 ENCOUNTER — Ambulatory Visit: Payer: PRIVATE HEALTH INSURANCE | Admitting: Family

## 2023-02-23 ENCOUNTER — Other Ambulatory Visit: Payer: Self-pay | Admitting: Neurology

## 2023-02-26 NOTE — Progress Notes (Signed)
NEUROLOGY FOLLOW UP OFFICE NOTE  Pamela Sanders [id number]  Assessment/Plan:    Tension-type headache, not intractable Essential tremor Migraine with and without aura, without status migrainosus, intractable Chronic neck pain s/p ACDF and radioablation   1  Primidone 50mg  at bedtime 2  Suggested starting zonisamide for headaches but she would like to avoid more medication.  She will contact me if she changes her mind. 3  Ubrelvy 100mg  at the earliest onset next time she has a migraine 4  Advised to follow up with her pain/spine specialist to look for other non-medication options for neck pain.  5  Follow up 1 year.   Subjective:  Pamela Sanders is a 57 year old right-handed female with degenerative joint disease with chronic neck pain s/p ACDF C5-C6 (2011) follows up for essential tremor.     UPDATE: Tremor: Currently taking primidone 50mg  at bedtime Tremors are controlled.  She is able to work in the lab.    Migraines: Occurs every 3 months at most.  Responded to [place].  Aborts quickly.   Tension-type headaches: Has chronic neck and shoulder pain.  History of radioablation.  Gets massages and will take a Flexeril from time to time.  They are mild.  They occur 2 times a week.  Treats with Tylenol (sometimes ibuprofen but causes GI upset).  Lasts 2 hours.        Current NSAIDS:  Ibuprofen (for tension-type headache) Current analgesics:  Tylenol (for tension-type headache) Current triptans:  none Current ergotamine:  no Current anti-emetic:  no Current muscle relaxants:  no Current sleep aide:  Ambien Current Antihypertensive medications:  no Current Antidepressant medications:  Wellbutrin XL 150mg  daily Current Anticonvulsant medications:  Gabapentin 600mg  twice daily, primidone 50mg  at bedtime (ET) Current anti-CGRP:  Ubrelvy 100mg  Current Vitamins/Herbal/Supplements:  no Current Antihistamines/Decongestants: Benadryl, Claritin Other therapy:  no Other medications:   Ambien   HISTORY: She started noticing tremor in 2021.  It is bilateral, but worse on the right.  It occurs when hands are with use or outstretched.  It does not occur at rest.  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  Her hands will shake.  No trouble swallowing.  No trouble with walking.  No history of REM sleep behavior disorder.  She does occasionally drink wine but has not noticed if symptoms subside when drinking.  She reports that her great grandmother had Parkinson's disease but no known family history of essential tremor.   Works in a lab papet tremors fluctuates right worse than left - over a year -    [place] grandmother had PD.  No family history of tremors    Migraines/Tension-type headache/chronic neck pain: Onset:  1999 Location:  Left or right frontal/retro-orbital, sometimes occipital, chronic left sided neck pain.  She has constant pain in left jaw (history of jaw surgery for TMJ.  wears a retainer and invisalign  Quality:  Stabbing/throbbing Intensity:  Moderate to severe.  Aura:  Sometimes "bluish vision".  One time had tunnel vision in 2005. Prodrome:  no Postdrome:  no Associated symptoms:  Photophobia, phonophobia.  Sometimes tinnitus.  She denies associated nausea, vomiting, visual disturbance, autonomic symptoms or unilateral numbness or weakness. Duration:  1/2 to 1 day Frequency:  Once every 5 to 6 months Frequency of abortive medication: 2 days a week Triggers/exacerbating factors:  Emotional stress, skipped meals, sleep deprivation  Relieving factors:  Laying down in dark and quiet room Activity:  Avoids   She also has tension-type headaches in the back of her head radiating into the neck as well as headaches at the bridge of her nose.  They last a day.  They occur 2 days a week.     MRI of brain with and without contrast (01/10/13):  "1.  No acute  intracranial abnormality.  2.  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  C5-C6 ACDF with anterior plate and screw fixation and complete graft incorporation.  No motion with flexion/extension to suggest pseudoarthrosis.  2.  Mild C4-C5 and C6-C7 degenerative disc disease.  Mild bilateral C7-T1 facet arthropathy.  3.  No acute fracture or prevertebral swelling.  4.  Multiple mandibular screws bilaterally."   Past NSAIDS:  etodolac Past analgesics:  no Past abortive triptans:  Sumatriptan (causes nausea) Past abortive ergotamine:  no Past muscle relaxants:  Tizanidine 4mg  (helped) Past anti-emetic:  Compazine (used for sumatriptan-induced nausea) Past antihypertensive medications:  no Past antidepressant medications:  Nortriptyline (did not tolerate), Lexapro, Prozac - didn't tolerate SSRIs.   Past anticonvulsant medications:  Topiramate (disoriented), Keppra Past anti-CGRP:  no Past vitamins/Herbal/Supplements:  no Past antihistamines/decongestants:  no Other past therapies:  Botox (effective but caused welts and itching), massage, caffeine  PAST MEDICAL HISTORY: Past Medical History:  Diagnosis Date   Allergic rhinitis 03/07/2008   Qualifier: Diagnosis of  By: Andrey Campanile MD, Carrie Mew of this note might be different from the original. Overview:  Qualifier: Diagnosis of  By: Andrey Campanile MD, LauraLee   Chest pain 12/31/2019   Chest tightness 01/29/2020   Chronic inflammatory arthritis 03/07/2008   Qualifier: Diagnosis of  By: Andrey Campanile MD, Carrie Mew of this note might be different from the original. Overview:  Qualifier: Diagnosis of  By: Andrey Campanile MD, LauraLee   Depression    Depression (emotion)    DIVERTICULITIS, HX OF 03/07/2008   Qualifier: Diagnosis of  By: Andrey Campanile MD, LauraLee     DJD (degenerative joint disease)    chronic neck pain   General medical examination 12/03/2010   GERD 03/07/2008    Qualifier: Diagnosis of  By: Andrey Campanile MD, Carrie Mew of this note might be different from the original. Overview:  Qualifier: Diagnosis of  By: Andrey Campanile MD, Raliegh Ip   GERD (gastroesophageal reflux disease)    Hip pain    History of diverticulitis of colon    Hx of diverticulitis of colon    Insomnia    Lateral epicondylitis of right elbow    Leukocytopenia    Migraine    Migraine without aura 03/07/2008   Qualifier: Diagnosis of  By: Andrey Campanile MD, LauraLee     Neck pain, chronic    NECK PAIN, CHRONIC 03/07/2008   Qualifier: Diagnosis of  By: Andrey Campanile MD, LauraLee     Osteoarthritis    Pain of left thumb    Pain of right sternoclavicular joint    Phantosmia    PONV (postoperative nausea and vomiting)    Routine general medical examination at a health care facility 08/09/2013   Sinusitis    Sternoclavicular joint pain, right 10/15/2019   TMJ (dislocation of temporomandibular joint)    Tremor of both hands 03/08/2020    MEDICATIONS: Current Outpatient Medications on File Prior to Visit  Medication Sig Dispense Refill   acetaminophen (TYLENOL) 325 MG tablet Take 650 mg  by mouth every 6 (six) hours as needed.     b complex vitamins capsule Take 1 capsule by mouth 2 (two) times a week.     buPROPion (WELLBUTRIN XL) 300 MG 24 hr tablet Take 1 tablet (300 mg total) by mouth daily. 90 tablet 1   busPIRone (BUSPAR) 5 MG tablet Take 1 tablet (5 mg total) by mouth 2 (two) times daily. 180 tablet 1   calcium citrate-vitamin D (CITRACAL+D) 315-200 MG-UNIT per tablet Take 2 tablets by mouth daily.     CAPSAICIN EX Apply 1 application. topically daily as needed (muscle pain).     cyclobenzaprine (FLEXERIL) 5 MG tablet Take 5 mg by mouth as needed for muscle spasms.     diphenhydrAMINE (BENADRYL) 25 MG tablet Take 25 mg by mouth daily as needed for itching or allergies.      famotidine (PEPCID AC) 10 MG chewable tablet Chew 10 mg by mouth daily as needed for heartburn.     gabapentin  (NEURONTIN) 300 MG capsule Take 600 mg by mouth 2 (two) times a day. 360 capsule 1   hyoscyamine (LEVSIN SL) 0.125 MG SL tablet Place 1 tablet (0.125 mg total) under the tongue every 4 (four) hours as needed. 30 tablet 0   ibuprofen (ADVIL) 200 MG tablet Take 200 mg by mouth every 6 (six) hours as needed.     loratadine (CLARITIN) 10 MG tablet Take 10 mg by mouth daily.     Multiple Vitamins-Minerals (ONE-A-DAY WOMENS 50 PLUS) TABS Take 1 tablet by mouth daily.     primidone (MYSOLINE) 50 MG tablet Take 1 tablet by mouth at bedtime. 30 tablet 0   Psyllium (METAMUCIL PO) Take 0.5 fluid ounces by mouth daily.     Ubrogepant (UBRELVY) 100 MG TABS Take 100 mg by mouth as needed. 16 tablet 5   zolpidem (AMBIEN) 5 MG tablet Take 1 tablet (5 mg total) by mouth at bedtime as needed. for sleep 30 tablet 2   No current facility-administered medications on file prior to visit.    ALLERGIES: Allergies  Allergen Reactions   Penicillins Anaphylaxis   Celebrex [Celecoxib]     Headaches   Shingrix [Zoster Vac Recomb Adjuvanted]     Fever, myalgia, local redness    FAMILY HISTORY: Family History  Problem Relation Age of Onset   Arthritis Other    Breast cancer Other    Coronary artery disease Maternal Grandmother    Breast cancer Maternal Grandmother        carcinoid tumor in abd   Hypertension Other    Diverticulitis Mother    Arthritis Mother    Pancreatic cancer Other       Objective:  Blood pressure 114/79, pulse 78, height 5\' 7"  (1.702 m), weight 175 lb (79.4 kg), SpO2 95%. General: No acute distress.  Patient appears well-groomed.   Head:  Normocephalic/atraumatic Eyes:  Fundi examined but not visualized Neck: supple, no paraspinal tenderness, full range of motion Heart:  Regular rate and rhythm Neurological Exam: alert and oriented.  Speech fluent and not dysarthric, language intact.  CN II-XII intact. Bulk and tone normal, muscle strength 5/5 throughout.  Sensation to light touch  intact.  Deep tendon reflexes 2+ throughout, toes downgoing.  Finger to nose testing intact.  Gait normal, Romberg negative.   Pamela Millet, DO  CC: Sandford Craze, NP

## 2023-03-08 ENCOUNTER — Ambulatory Visit: Payer: PRIVATE HEALTH INSURANCE | Admitting: Neurology

## 2023-03-08 ENCOUNTER — Encounter: Payer: Self-pay | Admitting: Neurology

## 2023-03-08 VITALS — BP 114/79 | HR 78 | Ht 67.0 in | Wt 175.0 lb

## 2023-03-08 DIAGNOSIS — G43001 Migraine without aura, not intractable, with status migrainosus: Secondary | ICD-10-CM | POA: Diagnosis not present

## 2023-03-08 DIAGNOSIS — G44209 Tension-type headache, unspecified, not intractable: Secondary | ICD-10-CM

## 2023-03-08 DIAGNOSIS — G250 Essential tremor: Secondary | ICD-10-CM

## 2023-03-08 MED ORDER — PRIMIDONE 50 MG PO TABS: 50.0000 mg | ORAL_TABLET | Freq: Every day | ORAL | 11 refills | Status: DC

## 2023-03-08 MED ORDER — UBRELVY 100 MG PO TABS: 100.0000 mg | ORAL_TABLET | ORAL | 11 refills | Status: DC | PRN

## 2023-03-08 NOTE — Patient Instructions (Signed)
Primidone and Ubrelvy Follow up with neck/spine specialist Consider starting ZONISAMIDE.  Contact me if you wish to try it Otherwise, follow up 1 year.

## 2023-03-09 ENCOUNTER — Telehealth: Payer: Self-pay

## 2023-03-09 MED ORDER — UBRELVY 100 MG PO TABS: 100.0000 mg | ORAL_TABLET | ORAL | 11 refills | Status: DC | PRN

## 2023-04-09 ENCOUNTER — Ambulatory Visit (HOSPITAL_BASED_OUTPATIENT_CLINIC_OR_DEPARTMENT_OTHER)
Admission: RE | Admit: 2023-04-09 | Discharge: 2023-04-09 | Disposition: A | Discharge status: Home / Self Care | Payer: PRIVATE HEALTH INSURANCE | Source: Ambulatory Visit | Attending: Family | Admitting: Family

## 2023-04-09 ENCOUNTER — Other Ambulatory Visit (HOSPITAL_BASED_OUTPATIENT_CLINIC_OR_DEPARTMENT_OTHER): Payer: Self-pay

## 2023-04-09 ENCOUNTER — Ambulatory Visit: Payer: PRIVATE HEALTH INSURANCE | Admitting: Family

## 2023-04-09 VITALS — BP 126/86 | HR 79 | Temp 99.1°F | Resp 16 | Ht 67.0 in | Wt 174.0 lb

## 2023-04-09 DIAGNOSIS — J988 Other specified respiratory disorders: Secondary | ICD-10-CM | POA: Insufficient documentation

## 2023-04-09 LAB — POCT RAPID STREP A (OFFICE): Rapid Strep A Screen: NEGATIVE

## 2023-04-09 LAB — POC COVID19 BINAXNOW: SARS Coronavirus 2 Ag: NEGATIVE

## 2023-04-09 MED ORDER — LEVOFLOXACIN 500 MG PO TABS
500.0000 mg | ORAL_TABLET | Freq: Every day | ORAL | 0 refills | Status: AC
  Filled 2023-04-09: qty 7, 7d supply, fill #0

## 2023-04-09 MED ORDER — ALBUTEROL SULFATE HFA 108 (90 BASE) MCG/ACT IN AERS
2.0000 | INHALATION_SPRAY | Freq: Four times a day (QID) | RESPIRATORY_TRACT | 0 refills | Status: DC | PRN
  Filled 2023-04-09: qty 6.7, 21d supply, fill #0

## 2023-04-09 MED ORDER — PREDNISONE 10 MG PO TABS
ORAL_TABLET | ORAL | 0 refills | Status: AC
  Filled 2023-04-09: qty 20, 8d supply, fill #0

## 2023-04-09 NOTE — Patient Instructions (Signed)
VISIT SUMMARY:  You came in today with worsening sinus headache symptoms, including fatigue, sore throat, and congestion. You also experienced difficulty breathing and a wheezy, raspy cough. Over-the-counter medications have not provided significant relief. You tested negative for COVID and strep.  YOUR PLAN:  -SUSPECTED PNEUMONIA: Pneumonia is an infection that inflames the air sacs in one or both lungs, which can cause symptoms like cough, wheezing, and difficulty breathing. We will confirm this with a chest X-ray. In the meantime, you have been prescribed antibiotics (since you are allergic to penicillin), oral prednisone to help with cough and tightness, and an albuterol inhaler for relief. You can continue using Delsym for cough suppression as needed.  INSTRUCTIONS:  Please follow up after the chest X-ray results are available.

## 2023-04-09 NOTE — Progress Notes (Signed)
Subjective:     Patient ID: Pamela Sanders, female    DOB: 1965-09-25, 57 y.o.   MRN: [id number]  Chief Complaint  Patient presents with   Cough    Complains of productive cough, negative covid test today   Sore Throat    Complains of sore throat   Chest congestion    Complains of chest feeling congested    Cough Associated symptoms include a sore throat and wheezing. Pertinent negatives include no ear pain or headaches.  Sore Throat  Associated symptoms include congestion and coughing. Pertinent negatives include no ear discharge, ear pain, headaches, neck pain or vomiting.    Discussed the use of AI scribe software for clinical note transcription with the patient, who gave verbal consent to proceed.   Patient is a 57 year old female that presents to the clinic with symptoms of chest tightness,productive cough,sore throat, head congestion,no fever noted. No body aches noted. She states that she just feels a lot of mucus in her chest. She states that her wife was sick before her and was unsure of what it was. Wife had same symptoms. She states that the symptoms started yesterday. She woke up with just some congestion that she thought was allergies but as the day progressed it got worse.   She states that she is using Mucinex OTC and Sudafed and she states it does seem to help a little bit.   Covid at home was negative today but was an expired test.   Flu shot is up to date     Health Maintenance Due  Topic Date Due   Cervical Cancer Screening (HPV/Pap Cotest)  06/28/2021   COVID-19 Vaccine (8 - 2023-24 season) 04/03/2023    Past Medical History:  Diagnosis Date   Allergic rhinitis 03/07/2008   Qualifier: Diagnosis of  By: Andrey Campanile MD, Carrie Mew of this note might be different from the original. Overview:  Qualifier: Diagnosis of  By: Andrey Campanile MD, LauraLee   Chest pain 12/31/2019   Chest tightness 01/29/2020   Chronic inflammatory arthritis 03/07/2008    Qualifier: Diagnosis of  By: Andrey Campanile MD, Carrie Mew of this note might be different from the original. Overview:  Qualifier: Diagnosis of  By: Andrey Campanile MD, LauraLee   Depression    Depression (emotion)    DIVERTICULITIS, HX OF 03/07/2008   Qualifier: Diagnosis of  By: Andrey Campanile MD, LauraLee     DJD (degenerative joint disease)    chronic neck pain   General medical examination 12/03/2010   GERD 03/07/2008   Qualifier: Diagnosis of  By: Andrey Campanile MD, Carrie Mew of this note might be different from the original. Overview:  Qualifier: Diagnosis of  By: Andrey Campanile MD, Raliegh Ip   GERD (gastroesophageal reflux disease)    Hip pain    History of diverticulitis of colon    Hx of diverticulitis of colon    Insomnia    Lateral epicondylitis of right elbow    Leukocytopenia    Migraine    Migraine without aura 03/07/2008   Qualifier: Diagnosis of  By: Andrey Campanile MD, LauraLee     Neck pain, chronic    NECK PAIN, CHRONIC 03/07/2008   Qualifier: Diagnosis of  By: Andrey Campanile MD, LauraLee     Osteoarthritis    Pain of left thumb    Pain of right sternoclavicular joint    Phantosmia    PONV (postoperative nausea and vomiting)    Routine general medical examination  at a health care facility 08/09/2013   Sinusitis    Sternoclavicular joint pain, right 10/15/2019   TMJ (dislocation of temporomandibular joint)    Tremor of both hands 03/08/2020    Past Surgical History:  Procedure Laterality Date   APPENDECTOMY     BLEPHAROPLASTY Bilateral 09/24/2022   CERVICAL ABLATION  05/2021   CERVICAL DISCECTOMY     Dr. Wyline Mood 11/2009- [facility]   COLONOSCOPY  08/03/2014   ESOPHAGOGASTRODUODENOSCOPY     Says it was normal but maybe a peptic ulcer. Early 2000's possibly [facility]   HEMORROIDECTOMY     LAPAROSCOPIC BILATERAL SALPINGO OOPHERECTOMY N/A 10/15/2021   Procedure: LAPAROSCOPIC BILATERAL SALPINGOOPHERECTOMY AND REMOVAL OF UTERINE MASS/PELVIC WASHINGS;  Surgeon: Shea Evans, MD;  Location: [facility] OR;   Service: Gynecology;  Laterality: N/A;   OVARIAN CYST SURGERY     abdominal surgery for ovarian cyst   TISSUE GRAFT  02/07/2015   pt reports gum graft for receeding gums--Dr Lucky Cowboy   TMJ ARTHROSCOPY     TMJ surgery    Family History  Problem Relation Age of Onset   Arthritis Other    Breast cancer Other    Coronary artery disease Maternal Grandmother    Breast cancer Maternal Grandmother        carcinoid tumor in abd   Hypertension Other    Diverticulitis Mother    Arthritis Mother    Pancreatic cancer Other     Social History   Socioeconomic History   Marital status: Married    Spouse name: Vernona Rieger   Number of children: 0   Years of education: Not on file   Highest education level: Doctorate  Occupational History   Occupation: Nurse, mental health: [employer]  Tobacco Use   Smoking status: Never   Smokeless tobacco: Never  Vaping Use   Vaping status: Never Used  Substance and Sexual Activity   Alcohol use: Yes    Alcohol/week: 0.0 standard drinks of alcohol    Comment: 2 drinks weekly, wine   Drug use: No   Sexual activity: Not on file  Other Topics Concern   Not on file  Social History Narrative   Domestic Partner   no children   She grew up in [place], [place]    Family moved to [place] when she was in [employer]   Occupation:lab research / Personal assistant     Regular Exercise:  3 x weekly   Caffeine Use:  2 cups coffee daily, 1 tea      Patient is right-handed. She lives with her wife in a 2 story home. She drinks 1-2 cups of coffee a day and 1-2 glasses of tea. She exercises regularly.   Social Determinants of Health   Financial Resource Strain: Low Risk  (04/09/2023)   Overall Financial Resource Strain (CARDIA)    Difficulty of Paying Living Expenses: Not hard at all  Food Insecurity: No Food Insecurity (04/09/2023)   Hunger Vital Sign    Worried About Running Out of Food in the Last Year: Never true    Ran Out of Food in the Last Year:  Never true  Transportation Needs: No Transportation Needs (04/09/2023)   PRAPARE - Administrator, Civil Service (Medical): No    Lack of Transportation (Non-Medical): No  Physical Activity: Insufficiently Active (04/09/2023)   Exercise Vital Sign    Days of Exercise per Week: 2 days    Minutes of Exercise per Session: 20 min  Stress: No Stress Concern Present (04/09/2023)   [employer] of Occupational Health - Occupational Stress Questionnaire    Feeling of Stress : Only a little  Social Connections: Moderately Isolated (04/09/2023)   Social Connection and Isolation Panel [NHANES]    Frequency of Communication with Friends and Family: Three times a week    Frequency of Social Gatherings with Friends and Family: Once a week    Attends Religious Services: Never    Database administrator or Organizations: No    Attends Engineer, structural: Not on file    Marital Status: Married  Catering manager Violence: Not on file    Outpatient Medications Prior to Visit  Medication Sig Dispense Refill   acetaminophen (TYLENOL) 325 MG tablet Take 650 mg by mouth every 6 (six) hours as needed.     b complex vitamins capsule Take 1 capsule by mouth 2 (two) times a week.     buPROPion (WELLBUTRIN XL) 300 MG 24 hr tablet Take 1 tablet (300 mg total) by mouth daily. 90 tablet 1   busPIRone (BUSPAR) 5 MG tablet Take 1 tablet (5 mg total) by mouth 2 (two) times daily. 180 tablet 1   calcium citrate-vitamin D (CITRACAL+D) 315-200 MG-UNIT per tablet Take 2 tablets by mouth daily.     CAPSAICIN EX Apply 1 application. topically daily as needed (muscle pain).     cyclobenzaprine (FLEXERIL) 5 MG tablet Take 5 mg by mouth as needed for muscle spasms.     diphenhydrAMINE (BENADRYL) 25 MG tablet Take 25 mg by mouth daily as needed for itching or allergies.      famotidine (PEPCID AC) 10 MG chewable tablet Chew 10 mg by mouth 2 (two) times daily as needed for heartburn.     gabapentin  (NEURONTIN) 300 MG capsule Take 600 mg by mouth 2 (two) times a day. 360 capsule 1   hyoscyamine (LEVSIN SL) 0.125 MG SL tablet Place 1 tablet (0.125 mg total) under the tongue every 4 (four) hours as needed. 30 tablet 0   ibuprofen (ADVIL) 200 MG tablet Take 200 mg by mouth every 6 (six) hours as needed.     loratadine (CLARITIN) 10 MG tablet Take 10 mg by mouth daily.     Multiple Vitamins-Minerals (ONE-A-DAY WOMENS 50 PLUS) TABS Take 1 tablet by mouth daily.     primidone (MYSOLINE) 50 MG tablet Take 1 tablet (50 mg total) by mouth at bedtime. 30 tablet 11   Psyllium (METAMUCIL PO) Take 0.5 fluid ounces by mouth daily.     Ubrogepant (UBRELVY) 100 MG TABS Take 1 tablet (100 mg total) by mouth as needed (Take 1 tab at the earlist onset of a migraine, May repeat in 2 hours. Max 2 tabs in 24 hours). 16 tablet 11   zolpidem (AMBIEN) 5 MG tablet Take 1 tablet (5 mg total) by mouth at bedtime as needed. for sleep 30 tablet 2   No facility-administered medications prior to visit.    Allergies  Allergen Reactions   Penicillins Anaphylaxis   Celebrex [Celecoxib]     Headaches   Shingrix [Zoster Vac Recomb Adjuvanted]     Fever, myalgia, local redness    Review of Systems  HENT:  Positive for congestion and sore throat. Negative for ear discharge and ear pain.   Respiratory:  Positive for cough, sputum production and wheezing.   Gastrointestinal:  Negative for nausea and vomiting.  Musculoskeletal:  Negative for back pain and neck pain.  Neurological:  Negative for dizziness and headaches.       Objective:    Physical Exam Constitutional:      Appearance: She is well-developed and normal weight.  HENT:     Nose: Congestion and rhinorrhea present.     Mouth/Throat:     Pharynx: Posterior oropharyngeal erythema present.  Cardiovascular:     Rate and Rhythm: Normal rate and regular rhythm.     Heart sounds: Normal heart sounds.  Pulmonary:     Effort: Pulmonary effort is normal.      Breath sounds: Rhonchi present.  Skin:    General: Skin is warm.  Neurological:     General: No focal deficit present.     Mental Status: She is alert and oriented to person, place, and time.  Psychiatric:        Mood and Affect: Mood normal.        Behavior: Behavior normal.      BP 126/86 (BP Location: Right Arm, Patient Position: Sitting, Cuff Size: Normal)   Pulse 79   Temp 99.1 F (37.3 C) (Oral)   Resp 16   Ht 5\' 7"  (1.702 m)   Wt 174 lb (78.9 kg)   SpO2 99%   BMI 27.25 kg/m  Wt Readings from Last 3 Encounters:  04/09/23 174 lb (78.9 kg)  03/08/23 175 lb (79.4 kg)  10/19/22 176 lb (79.8 kg)       Assessment & Plan:   Problem List Items Addressed This Visit   None   I am having Devon Panning maintain her calcium citrate-vitamin D, loratadine, famotidine, b complex vitamins, diphenhydrAMINE, Psyllium (METAMUCIL PO), CAPSAICIN EX, One-A-Day Womens 50 Plus, cyclobenzaprine, hyoscyamine, ibuprofen, acetaminophen, zolpidem, busPIRone, gabapentin, buPROPion, primidone, and Ubrelvy.  No orders of the defined types were placed in this encounter.

## 2023-04-09 NOTE — Assessment & Plan Note (Signed)
Suspected Pneumonia Acute onset of respiratory symptoms including wheezing, cough, and congestion. No fever reported. Physical exam reveals right-sided rales. Negative for COVID and Strep here in the office.  -Order chest X-ray to confirm diagnosis. -Prescribe antibiotics (patient is allergic to penicillin). -Prescribe oral prednisone to alleviate cough and tightness.  -Prescribe albuterol inhaler for symptomatic relief. -Continue over-the-counter Delsym as needed for cough suppression.  - stat chest x-ray today

## 2023-04-09 NOTE — Progress Notes (Signed)
Subjective:     Patient ID: Pamela Sanders, female    DOB: 01-20-1966, 57 y.o.   MRN: [id number]  Chief Complaint  Patient presents with   Cough    Complains of productive cough, negative covid test today   Sore Throat    Complains of sore throat   Chest congestion    Complains of chest feeling congested    Cough  Sore Throat  Associated symptoms include coughing.    Discussed the use of AI scribe software for clinical note transcription with the patient, who gave verbal consent to proceed.  History of Present Illness   The patient, with a history of sinus headaches, presents with worsening symptoms over the past three days. She reports feeling fatigued and having a sore throat and congestion, initially attributed to allergies. However, the patient woke up in the middle of the night with difficulty breathing and a wheezy, raspy cough. Over-the-counter medications such as Sudafed, Maxidex, Tylenol, and a cough suppressant have been taken with no significant improvement. The patient denies fever and body aches. She has tested negative for COVID at home with an expired test.          Health Maintenance Due  Topic Date Due   Cervical Cancer Screening (HPV/Pap Cotest)  06/28/2021   COVID-19 Vaccine (8 - 2023-24 season) 04/03/2023    Past Medical History:  Diagnosis Date   Allergic rhinitis 03/07/2008   Qualifier: Diagnosis of  By: Andrey Campanile MD, Carrie Mew of this note might be different from the original. Overview:  Qualifier: Diagnosis of  By: Andrey Campanile MD, LauraLee   Chest pain 12/31/2019   Chest tightness 01/29/2020   Chronic inflammatory arthritis 03/07/2008   Qualifier: Diagnosis of  By: Andrey Campanile MD, Carrie Mew of this note might be different from the original. Overview:  Qualifier: Diagnosis of  By: Andrey Campanile MD, LauraLee   Depression    Depression (emotion)    DIVERTICULITIS, HX OF 03/07/2008   Qualifier: Diagnosis of  By: Andrey Campanile MD, LauraLee     DJD  (degenerative joint disease)    chronic neck pain   General medical examination 12/03/2010   GERD 03/07/2008   Qualifier: Diagnosis of  By: Andrey Campanile MD, Carrie Mew of this note might be different from the original. Overview:  Qualifier: Diagnosis of  By: Andrey Campanile MD, Raliegh Ip   GERD (gastroesophageal reflux disease)    Hip pain    History of diverticulitis of colon    Hx of diverticulitis of colon    Insomnia    Lateral epicondylitis of right elbow    Leukocytopenia    Migraine    Migraine without aura 03/07/2008   Qualifier: Diagnosis of  By: Andrey Campanile MD, LauraLee     Neck pain, chronic    NECK PAIN, CHRONIC 03/07/2008   Qualifier: Diagnosis of  By: Andrey Campanile MD, LauraLee     Osteoarthritis    Pain of left thumb    Pain of right sternoclavicular joint    Phantosmia    PONV (postoperative nausea and vomiting)    Routine general medical examination at a health care facility 08/09/2013   Sinusitis    Sternoclavicular joint pain, right 10/15/2019   TMJ (dislocation of temporomandibular joint)    Tremor of both hands 03/08/2020    Past Surgical History:  Procedure Laterality Date   APPENDECTOMY     BLEPHAROPLASTY Bilateral 09/24/2022   CERVICAL ABLATION  05/2021   CERVICAL DISCECTOMY  Dr. Wyline Mood 11/2009- [facility]   COLONOSCOPY  08/03/2014   ESOPHAGOGASTRODUODENOSCOPY     Says it was normal but maybe a peptic ulcer. Early 2000's possibly [facility]   HEMORROIDECTOMY     LAPAROSCOPIC BILATERAL SALPINGO OOPHERECTOMY N/A 10/15/2021   Procedure: LAPAROSCOPIC BILATERAL SALPINGOOPHERECTOMY AND REMOVAL OF UTERINE MASS/PELVIC WASHINGS;  Surgeon: Shea Evans, MD;  Location: [facility] OR;  Service: Gynecology;  Laterality: N/A;   OVARIAN CYST SURGERY     abdominal surgery for ovarian cyst   TISSUE GRAFT  02/07/2015   pt reports gum graft for receeding gums--Dr Lucky Cowboy   TMJ ARTHROSCOPY     TMJ surgery    Family History  Problem Relation Age of Onset   Arthritis Other    Breast cancer  Other    Coronary artery disease Maternal Grandmother    Breast cancer Maternal Grandmother        carcinoid tumor in abd   Hypertension Other    Diverticulitis Mother    Arthritis Mother    Pancreatic cancer Other     Social History   Socioeconomic History   Marital status: Married    Spouse name: Vernona Rieger   Number of children: 0   Years of education: Not on file   Highest education level: Doctorate  Occupational History   Occupation: Nurse, mental health: [employer]  Tobacco Use   Smoking status: Never   Smokeless tobacco: Never  Vaping Use   Vaping status: Never Used  Substance and Sexual Activity   Alcohol use: Yes    Alcohol/week: 0.0 standard drinks of alcohol    Comment: 2 drinks weekly, wine   Drug use: No   Sexual activity: Not on file  Other Topics Concern   Not on file  Social History Narrative   Domestic Partner   no children   She grew up in [place], [place]    Family moved to [place] when she was in [employer]   Occupation:lab research / Personal assistant     Regular Exercise:  3 x weekly   Caffeine Use:  2 cups coffee daily, 1 tea      Patient is right-handed. She lives with her wife in a 2 story home. She drinks 1-2 cups of coffee a day and 1-2 glasses of tea. She exercises regularly.   Social Determinants of Health   Financial Resource Strain: Low Risk  (04/09/2023)   Overall Financial Resource Strain (CARDIA)    Difficulty of Paying Living Expenses: Not hard at all  Food Insecurity: No Food Insecurity (04/09/2023)   Hunger Vital Sign    Worried About Running Out of Food in the Last Year: Never true    Ran Out of Food in the Last Year: Never true  Transportation Needs: No Transportation Needs (04/09/2023)   PRAPARE - Administrator, Civil Service (Medical): No    Lack of Transportation (Non-Medical): No  Physical Activity: Insufficiently Active (04/09/2023)   Exercise Vital Sign    Days of Exercise per Week: 2 days     Minutes of Exercise per Session: 20 min  Stress: No Stress Concern Present (04/09/2023)   [employer] of Occupational Health - Occupational Stress Questionnaire    Feeling of Stress : Only a little  Social Connections: Moderately Isolated (04/09/2023)   Social Connection and Isolation Panel [NHANES]    Frequency of Communication with Friends and Family: Three times a week    Frequency of Social Gatherings with Friends and  Family: Once a week    Attends Religious Services: Never    Database administrator or Organizations: No    Attends Engineer, structural: Not on file    Marital Status: Married  Catering manager Violence: Not on file    Outpatient Medications Prior to Visit  Medication Sig Dispense Refill   acetaminophen (TYLENOL) 325 MG tablet Take 650 mg by mouth every 6 (six) hours as needed.     b complex vitamins capsule Take 1 capsule by mouth 2 (two) times a week.     buPROPion (WELLBUTRIN XL) 300 MG 24 hr tablet Take 1 tablet (300 mg total) by mouth daily. 90 tablet 1   busPIRone (BUSPAR) 5 MG tablet Take 1 tablet (5 mg total) by mouth 2 (two) times daily. 180 tablet 1   calcium citrate-vitamin D (CITRACAL+D) 315-200 MG-UNIT per tablet Take 2 tablets by mouth daily.     CAPSAICIN EX Apply 1 application. topically daily as needed (muscle pain).     cyclobenzaprine (FLEXERIL) 5 MG tablet Take 5 mg by mouth as needed for muscle spasms.     diphenhydrAMINE (BENADRYL) 25 MG tablet Take 25 mg by mouth daily as needed for itching or allergies.      famotidine (PEPCID AC) 10 MG chewable tablet Chew 10 mg by mouth 2 (two) times daily as needed for heartburn.     gabapentin (NEURONTIN) 300 MG capsule Take 600 mg by mouth 2 (two) times a day. 360 capsule 1   hyoscyamine (LEVSIN SL) 0.125 MG SL tablet Place 1 tablet (0.125 mg total) under the tongue every 4 (four) hours as needed. 30 tablet 0   ibuprofen (ADVIL) 200 MG tablet Take 200 mg by mouth every 6 (six) hours as needed.      loratadine (CLARITIN) 10 MG tablet Take 10 mg by mouth daily.     Multiple Vitamins-Minerals (ONE-A-DAY WOMENS 50 PLUS) TABS Take 1 tablet by mouth daily.     primidone (MYSOLINE) 50 MG tablet Take 1 tablet (50 mg total) by mouth at bedtime. 30 tablet 11   Psyllium (METAMUCIL PO) Take 0.5 fluid ounces by mouth daily.     Ubrogepant (UBRELVY) 100 MG TABS Take 1 tablet (100 mg total) by mouth as needed (Take 1 tab at the earlist onset of a migraine, May repeat in 2 hours. Max 2 tabs in 24 hours). 16 tablet 11   zolpidem (AMBIEN) 5 MG tablet Take 1 tablet (5 mg total) by mouth at bedtime as needed. for sleep 30 tablet 2   No facility-administered medications prior to visit.    Allergies  Allergen Reactions   Penicillins Anaphylaxis   Celebrex [Celecoxib]     Headaches   Shingrix [Zoster Vac Recomb Adjuvanted]     Fever, myalgia, local redness    Review of Systems  Respiratory:  Positive for cough.    See HPI    Objective:    Physical Exam Constitutional:      General: She is not in acute distress.    Appearance: Normal appearance. She is well-developed.  HENT:     Head: Normocephalic and atraumatic.     Right Ear: Tympanic membrane, ear canal and external ear normal.     Left Ear: Tympanic membrane, ear canal and external ear normal.     Mouth/Throat:     Pharynx: Posterior oropharyngeal erythema present. No pharyngeal swelling or oropharyngeal exudate.     Tonsils: No tonsillar exudate.  Eyes:  General: No scleral icterus. Neck:     Thyroid: No thyromegaly.  Cardiovascular:     Rate and Rhythm: Normal rate and regular rhythm.     Heart sounds: Normal heart sounds. No murmur heard. Pulmonary:     Effort: Pulmonary effort is normal. No respiratory distress.     Breath sounds: Examination of the right-upper field reveals wheezing and rales. Examination of the left-upper field reveals wheezing. Examination of the right-middle field reveals rales. Examination of the  right-lower field reveals rales. Examination of the left-lower field reveals rales. Wheezing and rales present.     Comments: Deep coarse cough noted  Musculoskeletal:     Cervical back: Neck supple.  Skin:    General: Skin is warm and dry.  Neurological:     Mental Status: She is alert and oriented to person, place, and time.  Psychiatric:        Mood and Affect: Mood normal.        Behavior: Behavior normal.        Thought Content: Thought content normal.        Judgment: Judgment normal.      BP 126/86 (BP Location: Right Arm, Patient Position: Sitting, Cuff Size: Normal)   Pulse 79   Temp 99.1 F (37.3 C) (Oral)   Resp 16   Ht 5\' 7"  (1.702 m)   Wt 174 lb (78.9 kg)   SpO2 99%   BMI 27.25 kg/m  Wt Readings from Last 3 Encounters:  04/09/23 174 lb (78.9 kg)  03/08/23 175 lb (79.4 kg)  10/19/22 176 lb (79.8 kg)       Assessment & Plan:   Problem List Items Addressed This Visit       Unprioritized   Respiratory infection - Primary    Suspected Pneumonia Acute onset of respiratory symptoms including wheezing, cough, and congestion. No fever reported. Physical exam reveals right-sided rales. Negative for COVID and Strep here in the office.  -Order chest X-ray to confirm diagnosis. -Prescribe antibiotics (patient is allergic to penicillin). -Prescribe oral prednisone to alleviate cough and tightness.  -Prescribe albuterol inhaler for symptomatic relief. -Continue over-the-counter Delsym as needed for cough suppression.  - stat chest x-ray today      Relevant Orders   DG Chest 2 View   POC COVID-19 (Completed)   POCT rapid strep A (Completed)    I am having Reonna Colello start on levofloxacin, predniSONE, and albuterol. I am also having her maintain her calcium citrate-vitamin D, loratadine, famotidine, b complex vitamins, diphenhydrAMINE, Psyllium (METAMUCIL PO), CAPSAICIN EX, One-A-Day Womens 50 Plus, cyclobenzaprine, hyoscyamine, ibuprofen, acetaminophen, zolpidem,  busPIRone, gabapentin, buPROPion, primidone, and Ubrelvy.  Meds ordered this encounter  Medications   levofloxacin (LEVAQUIN) 500 MG tablet    Sig: Take 1 tablet (500 mg total) by mouth daily for 7 days.    Dispense:  7 tablet    Refill:  0    Order Specific Question:   Supervising Provider    Answer:   Danise Edge A [[id number]]   predniSONE (DELTASONE) 10 MG tablet    Sig: Take 4 tablets (40 mg total) by mouth daily for 2 days, THEN 3 tablets (30 mg total) daily for 2 days, THEN 2 tablets (20 mg total) daily for 2 days, THEN 1 tablet (10 mg total) daily for 2 days.    Dispense:  20 tablet    Refill:  0    Order Specific Question:   Supervising Provider    Answer:  BLYTH, STACEY A [[id number]]   albuterol (VENTOLIN HFA) 108 (90 Base) MCG/ACT inhaler    Sig: Inhale 2 puffs into the lungs every 6 (six) hours as needed for wheezing or shortness of breath.    Dispense:  6.7 g    Refill:  0    Order Specific Question:   Supervising Provider    Answer:   Danise Edge A [[id number]]

## 2023-04-11 ENCOUNTER — Encounter: Payer: Self-pay | Admitting: Family

## 2023-04-12 ENCOUNTER — Other Ambulatory Visit: Payer: Self-pay | Admitting: Family

## 2023-04-19 ENCOUNTER — Other Ambulatory Visit (HOSPITAL_BASED_OUTPATIENT_CLINIC_OR_DEPARTMENT_OTHER): Payer: Self-pay

## 2023-04-19 ENCOUNTER — Ambulatory Visit: Payer: PRIVATE HEALTH INSURANCE | Admitting: Family Medicine

## 2023-04-19 ENCOUNTER — Encounter: Payer: Self-pay | Admitting: Family Medicine

## 2023-04-19 VITALS — BP 100/70 | HR 83 | Temp 98.0°F | Resp 18 | Ht 67.0 in | Wt 176.8 lb

## 2023-04-19 DIAGNOSIS — J40 Bronchitis, not specified as acute or chronic: Secondary | ICD-10-CM | POA: Diagnosis not present

## 2023-04-19 MED ORDER — KETOROLAC TROMETHAMINE 60 MG/2ML IM SOLN
60.0000 mg | Freq: Once | INTRAMUSCULAR | Status: AC
  Administered 2023-04-19: 60 mg via INTRAMUSCULAR

## 2023-04-19 MED ORDER — PROMETHAZINE-DM 6.25-15 MG/5ML PO SYRP
5.0000 mL | ORAL_SOLUTION | Freq: Four times a day (QID) | ORAL | 0 refills | Status: DC | PRN
  Filled 2023-04-19: qty 118, 6d supply, fill #0

## 2023-04-19 MED ORDER — DOXYCYCLINE HYCLATE 100 MG PO TABS
100.0000 mg | ORAL_TABLET | Freq: Two times a day (BID) | ORAL | 0 refills | Status: DC
  Filled 2023-04-19: qty 20, 10d supply, fill #0

## 2023-04-19 NOTE — Patient Instructions (Signed)

## 2023-04-19 NOTE — Progress Notes (Signed)
Established Patient Office Visit  Subjective   Patient ID: Pamela Sanders, female    DOB: 1965/11/04  Age: 57 y.o. MRN: [id number]  Chief Complaint  Patient presents with   Bronchitis   Follow-up    HPI Discussed the use of AI scribe software for clinical note transcription with the patient, who gave verbal consent to proceed.  History of Present Illness   The patient, with a history of arthritis, presented with a persistent headache and cough that began around Halloween. She was initially seen by another provider who prescribed a Z-Pak, Levofloxacin, Albuterol, and a Prednisone taper. The patient also underwent a chest x-ray due to initial breathing difficulties, which have since improved with Albuterol. Despite these treatments, the patient's headache and cough have persisted, although she noted a slight improvement in her voice and cough on the day of the visit.  The patient has been taking Mucinex and Tylenol for her headache, which she believes is sinus-related. She also reported using Sudafed. She has not had a fever and denies any throat pain. She has been using an inhaler twice a day, which is unusual for her as she typically only needs it for one day when prescribed in the past.  The patient stopped and then restarted the Levofloxacin for two days on the advice of her previous provider. She has been feeling fatigued and has been out of work for the week, which she attributes to poor sleep due to her symptoms. She has been taking Tylenol cold flu at night.  The patient has a history of migraines, which are currently under control, and she clarified that her current headache is not a migraine. She has not taken any medication on the day of the visit. She has a known allergy to penicillin.  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 She noted that her workload has been lighter recently as she had just finished some projects.       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 08/29/2020   DJD (degenerative joint disease)    GERD (gastroesophageal reflux disease)    Osteoarthritis    Pain of right sternoclavicular joint    Tremor of both hands 03/08/2020   Sternoclavicular joint pain, right 10/15/2019   Insomnia 10/16/2015   TMJ (dislocation of temporomandibular joint) 02/07/2015   Routine general medical examination at a health care facility 08/09/2013   Respiratory infection 05/14/2011   Palpitations 12/03/2010   General medical examination 12/03/2010   Bronchitis 09/30/2009   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 Overview:  Qualifier: Diagnosis of  By: Andrey Campanile MD, LauraLee   Chest pain 12/31/2019   Chest tightness 01/29/2020   Chronic inflammatory arthritis 03/07/2008   Qualifier: Diagnosis of  By: Andrey Campanile MD, Carrie Mew of this note might be different from the original. Overview:  Qualifier: Diagnosis of  By: Andrey Campanile MD, LauraLee   Depression  Depression (emotion)    DIVERTICULITIS, HX OF 03/07/2008   Qualifier: Diagnosis of  By: Andrey Campanile MD, LauraLee     DJD (degenerative joint disease)    chronic neck pain   General medical examination 12/03/2010   GERD 03/07/2008   Qualifier: Diagnosis of  By: Andrey Campanile MD, Carrie Mew of this note might be different from the original. Overview:  Qualifier: Diagnosis of  By: Andrey Campanile MD,  Raliegh Ip   GERD (gastroesophageal reflux disease)    Hip pain    History of diverticulitis of colon    Hx of diverticulitis of colon    Insomnia    Lateral epicondylitis of right elbow    Leukocytopenia    Migraine    Migraine without aura 03/07/2008   Qualifier: Diagnosis of  By: Andrey Campanile MD, LauraLee     Neck pain, chronic    NECK PAIN, CHRONIC 03/07/2008   Qualifier: Diagnosis of  By: Andrey Campanile MD, LauraLee     Osteoarthritis    Pain of left thumb    Pain of right sternoclavicular joint    Phantosmia    PONV (postoperative nausea and vomiting)    Routine general medical examination at a health care facility 08/09/2013   Sinusitis    Sternoclavicular joint pain, right 10/15/2019   TMJ (dislocation of temporomandibular joint)    Tremor of both hands 03/08/2020   Past Surgical History:  Procedure Laterality Date   APPENDECTOMY     BLEPHAROPLASTY Bilateral 09/24/2022   CERVICAL ABLATION  05/2021   CERVICAL DISCECTOMY     Dr. Wyline Mood 11/2009- [facility]   COLONOSCOPY  08/03/2014   ESOPHAGOGASTRODUODENOSCOPY     Says it was normal but maybe a peptic ulcer. Early 2000's possibly [facility]   HEMORROIDECTOMY     LAPAROSCOPIC BILATERAL SALPINGO OOPHERECTOMY N/A 10/15/2021   Procedure: LAPAROSCOPIC BILATERAL SALPINGOOPHERECTOMY AND REMOVAL OF UTERINE MASS/PELVIC WASHINGS;  Surgeon: Shea Evans, MD;  Location: [facility] OR;  Service: Gynecology;  Laterality: N/A;   OVARIAN CYST SURGERY     abdominal surgery for ovarian cyst   TISSUE GRAFT  02/07/2015   pt reports gum graft for receeding gums--Dr Lucky Cowboy   TMJ ARTHROSCOPY     TMJ surgery   Social History   Tobacco Use   Smoking status: Never   Smokeless tobacco: Never  Vaping Use   Vaping status: Never Used  Substance Use Topics   Alcohol use: Yes    Alcohol/week: 0.0 standard drinks of alcohol    Comment: 2 drinks weekly, wine   Drug use: No   Social History   Socioeconomic History   Marital status: Married    Spouse name: Vernona Rieger    Number of children: 0   Years of education: Not on file   Highest education level: Doctorate  Occupational History   Occupation: Nurse, mental health: [employer]  Tobacco Use   Smoking status: Never   Smokeless tobacco: Never  Vaping Use   Vaping status: Never Used  Substance and Sexual Activity   Alcohol use: Yes    Alcohol/week: 0.0 standard drinks of alcohol    Comment: 2 drinks weekly, wine   Drug use: No   Sexual activity: Not on file  Other Topics Concern   Not on file  Social History Narrative   Domestic Partner   no children   She grew up in [place], [place]    Family moved to [place] when she was in [employer]   Occupation:lab research /  lab manager     Regular Exercise:  3 x weekly   Caffeine Use:  2 cups coffee daily, 1 tea      Patient is right-handed. She lives with her wife in a 2 story home. She drinks 1-2 cups of coffee a day and 1-2 glasses of tea. She exercises regularly.   Social Determinants of Health   Financial Resource Strain: Low Risk  (04/09/2023)   Overall Financial Resource Strain (CARDIA)    Difficulty of Paying Living Expenses: Not hard at all  Food Insecurity: No Food Insecurity (04/09/2023)   Hunger Vital Sign    Worried About Running Out of Food in the Last Year: Never true    Ran Out of Food in the Last Year: Never true  Transportation Needs: No Transportation Needs (04/09/2023)   PRAPARE - Administrator, Civil Service (Medical): No    Lack of Transportation (Non-Medical): No  Physical Activity: Insufficiently Active (04/09/2023)   Exercise Vital Sign    Days of Exercise per Week: 2 days    Minutes of Exercise per Session: 20 min  Stress: No Stress Concern Present (04/09/2023)   [employer] of Occupational Health - Occupational Stress Questionnaire    Feeling of Stress : Only a little  Social Connections: Moderately Isolated (04/09/2023)   Social Connection and Isolation Panel [NHANES]    Frequency of  Communication with Friends and Family: Three times a week    Frequency of Social Gatherings with Friends and Family: Once a week    Attends Religious Services: Never    Database administrator or Organizations: No    Attends Engineer, structural: Not on file    Marital Status: Married  Catering manager Violence: Not on file   Family Status  Relation Name Status   Other  (Not Specified)   Other  (Not Specified)   MGM  (Not Specified)   Other  (Not Specified)   Mother  Alive   Father  Alive   Other MGGM Alive  No partnership data on file   Family History  Problem Relation Age of Onset   Arthritis Other    Breast cancer Other    Coronary artery disease Maternal Grandmother    Breast cancer Maternal Grandmother        carcinoid tumor in abd   Hypertension Other    Diverticulitis Mother    Arthritis Mother    Pancreatic cancer Other    Allergies  Allergen Reactions   Penicillins Anaphylaxis   Celebrex [Celecoxib]     Headaches   Shingrix [Zoster Vac Recomb Adjuvanted]     Fever, myalgia, local redness      Review of Systems  Constitutional:  Negative for fever and malaise/fatigue.  HENT:  Positive for sinus pain. Negative for congestion and sore throat.   Eyes:  Negative for blurred vision.  Respiratory:  Positive for cough, sputum production and wheezing. Negative for shortness of breath.   Cardiovascular:  Negative for chest pain, palpitations and leg swelling.  Gastrointestinal:  Negative for vomiting.  Musculoskeletal:  Negative for back pain.  Skin:  Negative for rash.  Neurological:  Negative for loss of consciousness and headaches.      Objective:     BP 100/70 (BP Location: Left Arm, Patient Position: Sitting, Cuff Size: Normal)   Pulse 83   Temp 98 F (36.7 C) (Oral)   Resp 18   Ht 5\' 7"  (1.702 m)   Wt 176 lb 12.8  oz (80.2 kg)   SpO2 95%   BMI 27.69 kg/m  BP Readings from Last 3 Encounters:  04/19/23 100/70  04/09/23 126/86  03/08/23  114/79   Wt Readings from Last 3 Encounters:  04/19/23 176 lb 12.8 oz (80.2 kg)  04/09/23 174 lb (78.9 kg)  03/08/23 175 lb (79.4 kg)   SpO2 Readings from Last 3 Encounters:  04/19/23 95%  04/09/23 99%  03/08/23 95%      Physical Exam Vitals and nursing note reviewed.  Constitutional:      General: She is not in acute distress.    Appearance: Normal appearance. She is well-developed.  HENT:     Head: Normocephalic and atraumatic.     Right Ear: Tympanic membrane, ear canal and external ear normal. There is no impacted cerumen.     Left Ear: Tympanic membrane, ear canal and external ear normal. There is no impacted cerumen.     Nose:     Right Sinus: Frontal sinus tenderness present.     Left Sinus: Frontal sinus tenderness present.     Mouth/Throat:     Mouth: Mucous membranes are moist.     Pharynx: Oropharynx is clear. No oropharyngeal exudate or posterior oropharyngeal erythema.  Eyes:     General: No scleral icterus.       Right eye: No discharge.        Left eye: No discharge.     Conjunctiva/sclera: Conjunctivae normal.     Pupils: Pupils are equal, round, and reactive to light.  Neck:     Thyroid: No thyromegaly or thyroid tenderness.     Vascular: No JVD.  Cardiovascular:     Rate and Rhythm: Normal rate and regular rhythm.     Heart sounds: Normal heart sounds. No murmur heard. Pulmonary:     Effort: Pulmonary effort is normal. No respiratory distress.     Breath sounds: Normal breath sounds.  Abdominal:     General: Bowel sounds are normal. There is no distension.     Palpations: Abdomen is soft. There is no mass.     Tenderness: There is no abdominal tenderness. There is no guarding or rebound.  Genitourinary:    Vagina: Normal.  Musculoskeletal:        General: Normal range of motion.     Cervical back: Normal range of motion and neck supple.     Right lower leg: No edema.     Left lower leg: No edema.  Lymphadenopathy:     Cervical: No cervical  adenopathy.  Skin:    General: Skin is warm and dry.     Findings: No erythema or rash.  Neurological:     Mental Status: She is alert and oriented to person, place, and time.     Cranial Nerves: No cranial nerve deficit.     Deep Tendon Reflexes: Reflexes are normal and symmetric.  Psychiatric:        Mood and Affect: Mood normal.        Behavior: Behavior normal.        Thought Content: Thought content normal.        Judgment: Judgment normal.      No results found for any visits on 04/19/23.  Last CBC Lab Results  Component Value Date   WBC 5.5 07/22/2022   HGB 13.0 07/22/2022   HCT 39.9 07/22/2022   MCV 89.0 07/22/2022   MCH 29.2 10/15/2021   RDW 13.6 07/22/2022   PLT 203.0 07/22/2022  Last metabolic panel Lab Results  Component Value Date   GLUCOSE 80 07/22/2022   NA 139 07/22/2022   K 4.5 07/22/2022   CL 103 07/22/2022   CO2 29 07/22/2022   BUN 17 07/22/2022   CREATININE 0.97 07/22/2022   GFR 64.97 07/22/2022   CALCIUM 9.3 07/22/2022   PROT 6.5 07/22/2022   ALBUMIN 4.0 07/22/2022   BILITOT 0.4 07/22/2022   ALKPHOS 82 07/22/2022   AST 23 07/22/2022   ALT 25 07/22/2022   ANIONGAP 3 (L) 10/12/2021   Last lipids Lab Results  Component Value Date   CHOL 215 (H) 02/04/2022   HDL 61.40 02/04/2022   LDLCALC 99 09/25/2019   LDLDIRECT 133.0 02/04/2022   TRIG 240.0 (H) 02/04/2022   CHOLHDL 4 02/04/2022   Last hemoglobin A1c Lab Results  Component Value Date   HGBA1C 5.9 05/28/2022   Last thyroid functions Lab Results  Component Value Date   TSH 2.51 07/22/2022   Last vitamin D Lab Results  Component Value Date   VD25OH 46.67 05/28/2022   Last vitamin B12 and Folate Lab Results  Component Value Date   VITAMINB12 424 03/21/2018   FOLATE 19.2 03/21/2018      The 10-year ASCVD risk score (Arnett DK, et al., 2019) is: 1.6%    Assessment & Plan:   Problem List Items Addressed This Visit       Unprioritized   Bronchitis - Primary    Relevant Medications   doxycycline (VIBRA-TABS) 100 MG tablet   promethazine-dextromethorphan (PROMETHAZINE-DM) 6.25-15 MG/5ML syrup  Assessment and Plan    Sinusitis   She has experienced persistent sinus headache and congestion since Halloween, without fever but with significant fatigue and headache. Previous treatments with levofloxacin and albuterol provided some relief; however, symptoms persist, and the current headache is not a migraine. We will administer a Toradol shot for headache relief, which is safe for driving, switch to doxycycline due to levofloxacin's ineffectiveness, and prescribe cough syrup for nighttime use to aid sleep. Follow-up is scheduled in 10-14 days with Melissa.  Acute Bronchitis   She reports a persistent cough and fatigue, with initial severe breathing issues resolved using albuterol. Despite a chest x-ray and no fever or body aches, she is using albuterol twice a day, more frequently than in the past. We discussed that the cough might be a waiting game and emphasized the importance of monitoring symptoms. She will continue albuterol as needed and monitor symptoms, with reassessment if there is no improvement.  General Health Maintenance   She received the flu vaccine and reports no recent fever or other systemic symptoms. She will continue current health maintenance practices.  Follow-up   A follow-up appointment with Efraim Kaufmann is scheduled in 10-14 days.         Return in about 2 weeks (around 05/03/2023) for with Melissa.    Donato Schultz, DO

## 2023-04-26 ENCOUNTER — Other Ambulatory Visit: Payer: Self-pay | Admitting: Family

## 2023-04-26 ENCOUNTER — Ambulatory Visit: Payer: PRIVATE HEALTH INSURANCE | Admitting: Podiatry

## 2023-04-28 ENCOUNTER — Telehealth: Payer: PRIVATE HEALTH INSURANCE | Admitting: Family

## 2023-04-28 DIAGNOSIS — J329 Chronic sinusitis, unspecified: Secondary | ICD-10-CM | POA: Diagnosis not present

## 2023-04-28 DIAGNOSIS — J40 Bronchitis, not specified as acute or chronic: Secondary | ICD-10-CM | POA: Diagnosis not present

## 2023-04-28 NOTE — Progress Notes (Addendum)
Subjective:   Video visit   Patient ID: Pamela Sanders, female    DOB: 12-Dec-1965, 57 y.o.   MRN: [id number]  Chief Complaint  Patient presents with   Bronchitis    Follow up, still having some headache    Patient location: Home. Patient and provider in visit Provider location: Home   I discussed the limitations of evaluation and management by telemedicine and the availability of in person appointments. The patient expressed understanding and agreed to proceed.   Visit Date: 04/28/2023   Today's healthcare provider: Lemont Fillers, NP     HPI  Discussed the use of AI scribe software for clinical note transcription with the patient, who gave verbal consent to proceed.  History of Present Illness   Pamela Sanders, a patient with a recent history of respiratory symptoms including wheezing, cough, and congestion, presents for follow-up. She was previously treated with prednisone, levaquin and albuterol on 04/09/23. A chest x-ray was performed and was negative for pneumonia. She later followed up with Dr. Zola Button on 11/11 who prescribed doxycycline for sinusits.   She reports significant improvement in her symptoms, with very mild residual congestion and cough. She also mentions having daily headaches, which she attributes to possible sinus issues. She denies any other new or worsening symptoms.          Health Maintenance Due  Topic Date Due   Cervical Cancer Screening (HPV/Pap Cotest)  06/28/2021   COVID-19 Vaccine (8 - 2023-24 season) 04/03/2023    Past Medical History:  Diagnosis Date   Allergic rhinitis 03/07/2008   Qualifier: Diagnosis of  By: Andrey Campanile MD, Carrie Mew of this note might be different from the original. Overview:  Qualifier: Diagnosis of  By: Andrey Campanile MD, LauraLee   Chest pain 12/31/2019   Chest tightness 01/29/2020   Chronic inflammatory arthritis 03/07/2008   Qualifier: Diagnosis of  By: Andrey Campanile MD, Carrie Mew of this note might be  different from the original. Overview:  Qualifier: Diagnosis of  By: Andrey Campanile MD, LauraLee   Depression    Depression (emotion)    DIVERTICULITIS, HX OF 03/07/2008   Qualifier: Diagnosis of  By: Andrey Campanile MD, LauraLee     DJD (degenerative joint disease)    chronic neck pain   General medical examination 12/03/2010   GERD 03/07/2008   Qualifier: Diagnosis of  By: Andrey Campanile MD, Carrie Mew of this note might be different from the original. Overview:  Qualifier: Diagnosis of  By: Andrey Campanile MD, Raliegh Ip   GERD (gastroesophageal reflux disease)    Hip pain    History of diverticulitis of colon    Hx of diverticulitis of colon    Insomnia    Lateral epicondylitis of right elbow    Leukocytopenia    Migraine    Migraine without aura 03/07/2008   Qualifier: Diagnosis of  By: Andrey Campanile MD, LauraLee     Neck pain, chronic    NECK PAIN, CHRONIC 03/07/2008   Qualifier: Diagnosis of  By: Andrey Campanile MD, LauraLee     Osteoarthritis    Pain of left thumb    Pain of right sternoclavicular joint    Phantosmia    PONV (postoperative nausea and vomiting)    Routine general medical examination at a health care facility 08/09/2013   Sinusitis    Sternoclavicular joint pain, right 10/15/2019   TMJ (dislocation of temporomandibular joint)    Tremor of both hands 03/08/2020    Past Surgical History:  Procedure Laterality Date   APPENDECTOMY     BLEPHAROPLASTY Bilateral 09/24/2022   CERVICAL ABLATION  05/2021   CERVICAL DISCECTOMY     Dr. Wyline Mood 11/2009- [facility]   COLONOSCOPY  08/03/2014   ESOPHAGOGASTRODUODENOSCOPY     Says it was normal but maybe a peptic ulcer. Early 2000's possibly [facility]   HEMORROIDECTOMY     LAPAROSCOPIC BILATERAL SALPINGO OOPHERECTOMY N/A 10/15/2021   Procedure: LAPAROSCOPIC BILATERAL SALPINGOOPHERECTOMY AND REMOVAL OF UTERINE MASS/PELVIC WASHINGS;  Surgeon: Shea Evans, MD;  Location: [facility];  Service: Gynecology;  Laterality: N/A;   OVARIAN CYST SURGERY     abdominal surgery  for ovarian cyst   TISSUE GRAFT  02/07/2015   pt reports gum graft for receeding gums--Dr Lucky Cowboy   TMJ ARTHROSCOPY     TMJ surgery    Family History  Problem Relation Age of Onset   Arthritis Other    Breast cancer Other    Coronary artery disease Maternal Grandmother    Breast cancer Maternal Grandmother        carcinoid tumor in abd   Hypertension Other    Diverticulitis Mother    Arthritis Mother    Pancreatic cancer Other     Social History   Socioeconomic History   Marital status: Married    Spouse name: Vernona Rieger   Number of children: 0   Years of education: Not on file   Highest education level: Doctorate  Occupational History   Occupation: Nurse, mental health: [employer]  Tobacco Use   Smoking status: Never   Smokeless tobacco: Never  Vaping Use   Vaping status: Never Used  Substance and Sexual Activity   Alcohol use: Yes    Alcohol/week: 0.0 standard drinks of alcohol    Comment: 2 drinks weekly, wine   Drug use: No   Sexual activity: Not on file  Other Topics Concern   Not on file  Social History Narrative   Domestic Partner   no children   She grew up in [place], [place]    Family moved to [place] when she was in [employer]   Occupation:lab research / Personal assistant     Regular Exercise:  3 x weekly   Caffeine Use:  2 cups coffee daily, 1 tea      Patient is right-handed. She lives with her wife in a 2 story home. She drinks 1-2 cups of coffee a day and 1-2 glasses of tea. She exercises regularly.   Social Determinants of Health   Financial Resource Strain: Low Risk  (04/09/2023)   Overall Financial Resource Strain (CARDIA)    Difficulty of Paying Living Expenses: Not hard at all  Food Insecurity: No Food Insecurity (04/09/2023)   Hunger Vital Sign    Worried About Running Out of Food in the Last Year: Never true    Ran Out of Food in the Last Year: Never true  Transportation Needs: No Transportation Needs (04/09/2023)   PRAPARE -  Administrator, Civil Service (Medical): No    Lack of Transportation (Non-Medical): No  Physical Activity: Insufficiently Active (04/09/2023)   Exercise Vital Sign    Days of Exercise per Week: 2 days    Minutes of Exercise per Session: 20 min  Stress: No Stress Concern Present (04/09/2023)   [employer] of Occupational Health - Occupational Stress Questionnaire    Feeling of Stress : Only a little  Social Connections: Moderately Isolated (04/09/2023)   Social Connection and  Isolation Panel [NHANES]    Frequency of Communication with Friends and Family: Three times a week    Frequency of Social Gatherings with Friends and Family: Once a week    Attends Religious Services: Never    Database administrator or Organizations: No    Attends Engineer, structural: Not on file    Marital Status: Married  Catering manager Violence: Not on file    Outpatient Medications Prior to Visit  Medication Sig Dispense Refill   acetaminophen (TYLENOL) 325 MG tablet Take 650 mg by mouth every 6 (six) hours as needed.     albuterol (VENTOLIN HFA) 108 (90 Base) MCG/ACT inhaler Inhale 2 puffs into the lungs every 6 (six) hours as needed for wheezing or shortness of breath. 6.7 g 0   b complex vitamins capsule Take 1 capsule by mouth 2 (two) times a week.     buPROPion (WELLBUTRIN XL) 300 MG 24 hr tablet Take 1 tablet (300 mg total) by mouth daily. 90 tablet 1   busPIRone (BUSPAR) 5 MG tablet Take 1 tablet (5 mg total) by mouth 2 (two) times daily. 180 tablet 1   calcium citrate-vitamin D (CITRACAL+D) 315-200 MG-UNIT per tablet Take 2 tablets by mouth daily.     CAPSAICIN EX Apply 1 application. topically daily as needed (muscle pain).     cyclobenzaprine (FLEXERIL) 5 MG tablet Take 5 mg by mouth as needed for muscle spasms.     diphenhydrAMINE (BENADRYL) 25 MG tablet Take 25 mg by mouth daily as needed for itching or allergies.      famotidine (PEPCID AC) 10 MG chewable tablet Chew 10  mg by mouth 2 (two) times daily as needed for heartburn.     gabapentin (NEURONTIN) 300 MG capsule Take 600 mg by mouth 2 (two) times a day. 360 capsule 1   hyoscyamine (LEVSIN SL) 0.125 MG SL tablet Place 1 tablet (0.125 mg total) under the tongue every 4 (four) hours as needed. 30 tablet 0   ibuprofen (ADVIL) 200 MG tablet Take 200 mg by mouth every 6 (six) hours as needed.     loratadine (CLARITIN) 10 MG tablet Take 10 mg by mouth daily.     Multiple Vitamins-Minerals (ONE-A-DAY WOMENS 50 PLUS) TABS Take 1 tablet by mouth daily.     primidone (MYSOLINE) 50 MG tablet Take 1 tablet (50 mg total) by mouth at bedtime. 30 tablet 11   Psyllium (METAMUCIL PO) Take 0.5 fluid ounces by mouth daily.     Ubrogepant (UBRELVY) 100 MG TABS Take 1 tablet (100 mg total) by mouth as needed (Take 1 tab at the earlist onset of a migraine, May repeat in 2 hours. Max 2 tabs in 24 hours). 16 tablet 11   zolpidem (AMBIEN) 5 MG tablet Take 1 tablet (5 mg total) by mouth at bedtime as needed. for sleep 30 tablet 2   doxycycline (VIBRA-TABS) 100 MG tablet Take 1 tablet (100 mg total) by mouth 2 (two) times daily. 20 tablet 0   No facility-administered medications prior to visit.    Allergies  Allergen Reactions   Penicillins Anaphylaxis   Celebrex [Celecoxib]     Headaches   Shingrix [Zoster Vac Recomb Adjuvanted]     Fever, myalgia, local redness    ROS See HPI    Objective:    Physical Exam   There were no vitals taken for this visit. Wt Readings from Last 3 Encounters:  04/19/23 176 lb 12.8 oz (80.2 kg)  04/09/23 174 lb (78.9 kg)  03/08/23 175 lb (79.4 kg)   Gen: Awake, alert, no acute distress Resp: Breathing is even and non-labored Psych: calm/pleasant demeanor Neuro: Alert and Oriented x 3, + facial symmetry, speech is clear.      Assessment & Plan:   Problem List Items Addressed This Visit       Unprioritized   Sinusitis    Improved following doxycycline.  Monitor.       Bronchitis - Primary    Presented with wheezing, cough, and congestion. Initial concern for pneumonia, but chest x-ray was clear. Prednisone, albuterol, and antibiotics were prescribed. Patient reported significant improvement in chest symptoms after starting medications, but still has some residual nasal congestion and occasional cough. -Contact office if fever, new symptoms develop or if symptoms do not continue to resolve.       I have discontinued Teandra Elmquist's doxycycline. I am also having her maintain her calcium citrate-vitamin D, loratadine, famotidine, b complex vitamins, diphenhydrAMINE, Psyllium (METAMUCIL PO), CAPSAICIN EX, One-A-Day Womens 50 Plus, cyclobenzaprine, hyoscyamine, ibuprofen, acetaminophen, zolpidem, primidone, Ubrelvy, albuterol, busPIRone, buPROPion, and gabapentin.  No orders of the defined types were placed in this encounter.  Expand All Collapse All    Subjective:        Subjective Patient ID: Pamela Sanders, female    DOB: 02/12/1952, 57 y.o.   MRN: [id number]       Chief Complaint  Patient presents with   Cough      Patient reports persistent cough for about 2 weeks. OTC meds not working.    Patient location: Home. Patient and provider in visit Provider location: Home   I discussed the limitations of evaluation and management by telemedicine and the availability of in person appointments. The patient expressed understanding and agreed to proceed.   Visit Date: 04/28/2023   Today's healthcare provider: Lemont Fillers, NP          Discussed the use of AI scribe software for clinical note transcription with the patient, who gave verbal consent to proceed.   History of Present Illness       The patient, with a history of asthma, presents with a persistent cough and wheezing for the past couple of weeks. She describes the cough as severe, to the point of almost inducing vomiting, and it is particularly troublesome at night, disrupting  her sleep. She has been producing thick, clear phlegm which she describes as almost choking her when it comes up. She has been managing her symptoms with over-the-counter medications including Mucinex and Advil, but with little relief. She has not had a fever. She has been using her prescribed Symbicort and Singulair, as well as a rescue inhaler. She has not yet received her COVID booster shot this year.     Health Maintenance Due  Topic Date Due   Hepatitis C Screening  Never done   Zoster Vaccines- Shingrix (1 of 2) 02/11/2001   DTaP/Tdap/Td (3 - Td or Tdap) 02/10/2021   COVID-19 Vaccine (4 - 2023-24 season) 02/07/2023          Past Medical History:  Diagnosis Date   Acute pain of left knee 01/12/2020   Allergic rhinitis 10/09/2009    Qualifier: Diagnosis of  By: Rita Ohara     Allergic rhinitis due to animal (cat) (dog) hair and dander 10/17/2020   Allergy     Asthma     Atypical chest pain 07/29/2017   B12 deficiency     Back  pain     Body mass index (BMI) 35.0-35.9, adult 08/11/2019   Bronchitis      hx   Chronic allergic conjunctivitis 10/17/2020   Chronic neck pain 06/11/2015   Colon polyp 01/16/2004   Depressive disorder 01/11/2017    PT DENIES   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   Essential hypertension 01/18/2008    Qualifier: Diagnosis of  By: Lovell Sheehan MD, Balinda Quails    GERD (gastroesophageal reflux disease)     History of kidney stones      per 12-13-2018 pre-op eval , suspected kidney stone , to [facility] CT abd on 7-9- for further eval , passive   Hypertension     Infectious disease 09/02/2015   Low back pain     Osteoarthritis     Pneumonia     Pre-diabetes     Prediabetes 10/19/2016   Referred otalgia of left ear 07/06/2016   Sinus trouble                 Past Surgical History:  Procedure Laterality Date   CHOLECYSTECTOMY       COLONOSCOPY       COLONOSCOPY W/ BIOPSIES   01/16/2004   left knee replacement Left 09/2021   LUMBAR FUSION   2008   LUMBAR  FUSION        2020   PARTIAL HIP ARTHROPLASTY Right 2009     hip replacement   TONSILLECTOMY       TOTAL HIP ARTHROPLASTY Left 12/21/2018    Procedure: TOTAL HIP ARTHROPLASTY ANTERIOR APPROACH;  Surgeon: Ollen Gross, MD;  Location: [facility];  Service: Orthopedics;  Laterality: Left;   TOTAL KNEE ARTHROPLASTY Right 02/26/2014    Procedure: RIGHT TOTAL KNEE ARTHROPLASTY;  Surgeon: Dannielle Huh, MD;  Location: [facility] OR;  Service: Orthopedics;  Laterality: Right;   VIDEO BRONCHOSCOPY Bilateral 03/17/2013    Procedure: VIDEO BRONCHOSCOPY WITHOUT FLUORO;  Surgeon: Leslye Peer, MD;  Location: [facility];  Service: Cardiopulmonary;  Laterality: Bilateral;               Family History  Problem Relation Age of Onset   COPD Mother     Heart disease Mother     Ovarian cancer Mother     Emphysema Mother     Hypertension Mother     Hyperlipidemia Mother     Heart Problems Mother     Stroke Mother     Thyroid disease Mother     Cancer Mother     COPD Father          died at age 24   Emphysema Father     Heart Problems Father     Heart disease Brother          stent with 90%   Breast cancer Maternal Aunt     Colon cancer Neg Hx     Esophageal cancer Neg Hx     Rectal cancer Neg Hx     Stomach cancer Neg Hx     Colon polyps Neg Hx            Social History         Socioeconomic History   Marital status: Single      Spouse name: Not on file   Number of children: 1   Years of education: Not on file   Highest education level: Bachelor's degree (e.g., BA, AB, BS)  Occupational History   Occupation: Chief Technology Officer: [employer]  Tobacco Use   Smoking status: Never      Passive exposure: Never   Smokeless tobacco: Never  Vaping Use   Vaping status: Never Used  Substance and Sexual Activity   Alcohol use: No   Drug use: No   Sexual activity: Not Currently      Partners: Male      Birth control/protection: Post-menopausal  Other Topics Concern   Not on  file  Social History Narrative    Divorced    One daughter- lives locally and one grandson    Retired Runner, broadcasting/film/video,  Has masters degree    Enjoys reading, spending time with her grandson    Social Determinants of Health        Financial Resource Strain: Low Risk  (04/26/2023)    Overall Financial Resource Strain (CARDIA)     Difficulty of Paying Living Expenses: Not hard at all  Food Insecurity: No Food Insecurity (04/26/2023)    Hunger Vital Sign     Worried About Running Out of Food in the Last Year: Never true     Ran Out of Food in the Last Year: Never true  Transportation Needs: No Transportation Needs (04/26/2023)    PRAPARE - Therapist, art (Medical): No     Lack of Transportation (Non-Medical): No  Physical Activity: Insufficiently Active (04/26/2023)    Exercise Vital Sign     Days of Exercise per Week: 3 days     Minutes of Exercise per Session: 40 min  Stress: No Stress Concern Present (04/26/2023)    [employer] of Occupational Health - Occupational Stress Questionnaire     Feeling of Stress : Not at all  Social Connections: Unknown (04/26/2023)    Social Connection and Isolation Panel [NHANES]     Frequency of Communication with Friends and Family: Twice a week     Frequency of Social Gatherings with Friends and Family: Once a week     Attends Religious Services: Patient declined     Database administrator or Organizations: Yes     Attends Engineer, structural: More than 4 times per year     Marital Status: Divorced  Intimate Partner Violence: Not At Risk (07/23/2022)    Humiliation, Afraid, Rape, and Kick questionnaire     Fear of Current or Ex-Partner: No     Emotionally Abused: No     Physically Abused: No     Sexually Abused: No            Outpatient Medications Prior to Visit  Medication Sig Dispense Refill   AIRSUPRA 90-80 MCG/ACT AERO SMARTSIG:2 Puff(s) Via Inhaler Daily PRN       budesonide-formoterol (SYMBICORT)  160-4.5 MCG/ACT inhaler Inhale 2 puffs into the lungs 2 (two) times daily.       DEXILANT 60 MG capsule TAKE 1 CAPSULE BY MOUTH EVERY DAY 90 capsule 1   EPINEPHrine 0.3 mg/0.3 mL IJ SOAJ injection Inject 0.3 mg into the muscle as needed for anaphylaxis.       estradiol (ESTRACE) 0.1 MG/GM vaginal cream Place a pea-sized amount on fingertip and apply 3 times weekly to vaginal region and urethra as directed 42.5 g 5   meclizine (ANTIVERT) 12.5 MG tablet Take 1 tablet (12.5 mg total) by mouth 3 (three) times daily as needed for dizziness. 30 tablet 0   montelukast (SINGULAIR) 10 MG tablet Take 1 tablet (10 mg total) by mouth at bedtime. 30 tablet 3  omalizumab (XOLAIR) 150 MG injection Inject 150 mg into the skin every 14 (fourteen) days.       sulfamethoxazole-trimethoprim (BACTRIM) 400-80 MG tablet Take 1 tablet by mouth at bedtime. 90 tablet 0   traMADol (ULTRAM) 50 MG tablet Take 50-100 mg by mouth every 6 (six) hours as needed for pain.       amLODipine (NORVASC) 5 MG tablet Take 1 tablet (5 mg total) by mouth daily. 90 tablet 3   hydrochlorothiazide (MICROZIDE) 12.5 MG capsule Take 1 capsule (12.5 mg total) by mouth daily. 90 capsule 3      No facility-administered medications prior to visit.        Allergies      Allergies  Allergen Reactions   Augmentin [Amoxicillin-Pot Clavulanate] Diarrhea   Pantoprazole Sodium Palpitations        Review of Systems  Respiratory:  Positive for cough.     See HPI     Objective:    Objective Physical Exam     There were no vitals taken for this visit.    Wt Readings from Last 3 Encounters:  03/03/23 189 lb (85.7 kg)  01/19/23 190 lb (86.2 kg)  01/05/23 194 lb (88 kg)    Gen: Awake, alert, no acute distress Resp: Breathing is even and non-labored Psych: calm/pleasant demeanor Neuro: Alert and Oriented x 3, + facial symmetry, speech is clear.   Assessment & Plan:    Problem List Items Addressed This Visit               Unprioritized    Bronchitis with bronchospasm - Primary        Persistent cough with thick clear sputum, wheezing, and fatigue for two weeks. No fever. Current medications include Symbicort and Singulair. -Start Prednisone course. -Continue Albuterol every 6 hours routinely. -Continue Symbicort two puffs twice a day. -Order Z-Pak and send to [facility]. -Order chest x-ray to rule out pneumonia. -Consider Tessalon for cough relief.   -Advise to get COVID-19 booster shot at any major pharmacy or at the med center downstairs.   Follow-up If symptoms worsen, develop a fever, or no improvement in 3-4 days, patient to contact office.        Relevant Medications    predniSONE (DELTASONE) 10 MG tablet    azithromycin (ZITHROMAX) 250 MG tablet    Other Relevant Orders    DG Chest 2 View      I am having Larry Sierras. [place] start on predniSONE, azithromycin, benzonatate, and albuterol. I am also having her maintain her omalizumab, budesonide-formoterol, traMADol, EPINEPHrine, montelukast, amLODipine, hydrochlorothiazide, Airsupra, meclizine, sulfamethoxazole-trimethoprim, estradiol, and Dexilant.        Meds ordered this encounter  Medications   predniSONE (DELTASONE) 10 MG tablet      Sig: 4 tabs by mouth once daily for 2 days, then 3 tabs daily x 2 days, then 2 tabs daily x 2 days, then 1 tab daily x 2 days      Dispense:  20 tablet      Refill:  0      Order Specific Question:   Supervising Provider      Answer:   Danise Edge A [[id number]]   azithromycin (ZITHROMAX) 250 MG tablet      Sig: Take 2 tablets on day 1, then 1 tablet daily on days 2 through 5      Dispense:  6 tablet      Refill:  0      Order  Specific Question:   Supervising Provider      Answer:   Danise Edge A [[id number]]   benzonatate (TESSALON) 100 MG capsule      Sig: Take 1 capsule (100 mg total) by mouth 3 (three) times daily as needed.      Dispense:  20 capsule      Refill:  0      Order Specific Question:    Supervising Provider      Answer:   Danise Edge A [[id number]]   albuterol (VENTOLIN HFA) 108 (90 Base) MCG/ACT inhaler      Sig: Inhale 2 puffs into the lungs every 6 (six) hours as needed for wheezing or shortness of breath.      Order Specific Question:   Supervising Provider      Answer:   Bradd Canary [[id number]]    I discussed the assessment and treatment plan with the patient. The patient was provided an opportunity to ask questions and all were answered. The patient agreed with the plan and demonstrated an understanding of the instructions.   The patient was advised to call back or seek an in-person evaluation if the symptoms worsen or if the condition fails to improve as anticipated.       Lemont Fillers, NP [facility] at [facility] [phone] (phone) [phone] (fax)   [facility]

## 2023-04-28 NOTE — Assessment & Plan Note (Signed)
Presented with wheezing, cough, and congestion. Initial concern for pneumonia, but chest x-ray was clear. Prednisone, albuterol, and antibiotics were prescribed. Patient reported significant improvement in chest symptoms after starting medications, but still has some residual nasal congestion and occasional cough. -Contact office if fever, new symptoms develop or if symptoms do not continue to resolve.

## 2023-04-28 NOTE — Assessment & Plan Note (Signed)
Improved following doxycycline.  Monitor.

## 2023-04-28 NOTE — Patient Instructions (Signed)
VISIT SUMMARY:  Pamela Sanders, you came in today for a follow-up on your recent respiratory symptoms. You have experienced significant improvement after your initial treatment, though you still have some residual congestion and occasional cough. You also mentioned having daily headaches, which may be related to sinus congestion.  YOUR PLAN:  -BRONCHITIS: Bronchitis is an inflammation of the bronchial tubes, which carry air to your lungs. You initially had symptoms like wheezing, cough, and congestion, but your chest x-ray was clear. You were treated with prednisone, albuterol, and antibiotics, and you have reported significant improvement. Please continue your current treatment regimen and contact our office if you develop a fever or new symptoms.  -SINUS CONGESTION: Sinus congestion can cause headaches and is often due to inflammation or infection in the sinus cavities. To help relieve your symptoms, you can take over-the-counter Tylenol and Sudafed.  INSTRUCTIONS:  Please continue your current treatment regimen for bronchitis and use over-the-counter Tylenol and Sudafed for sinus congestion. Contact our office if you develop a fever or new symptoms.

## 2023-04-29 ENCOUNTER — Ambulatory Visit: Payer: PRIVATE HEALTH INSURANCE | Admitting: Podiatry

## 2023-05-02 ENCOUNTER — Encounter: Payer: Self-pay | Admitting: Family

## 2023-05-03 ENCOUNTER — Ambulatory Visit: Payer: PRIVATE HEALTH INSURANCE | Admitting: Family Medicine

## 2023-05-03 ENCOUNTER — Encounter: Payer: Self-pay | Admitting: Family Medicine

## 2023-05-03 ENCOUNTER — Ambulatory Visit (HOSPITAL_BASED_OUTPATIENT_CLINIC_OR_DEPARTMENT_OTHER)
Admission: RE | Admit: 2023-05-03 | Discharge: 2023-05-03 | Disposition: A | Discharge status: Home / Self Care | Payer: PRIVATE HEALTH INSURANCE | Source: Ambulatory Visit | Attending: Family Medicine | Admitting: Family Medicine

## 2023-05-03 VITALS — BP 110/74 | HR 73 | Temp 97.9°F | Resp 18 | Ht 67.0 in | Wt 172.0 lb

## 2023-05-03 DIAGNOSIS — K5792 Diverticulitis of intestine, part unspecified, without perforation or abscess without bleeding: Secondary | ICD-10-CM | POA: Diagnosis not present

## 2023-05-03 DIAGNOSIS — R1032 Left lower quadrant pain: Secondary | ICD-10-CM | POA: Insufficient documentation

## 2023-05-03 LAB — CBC
HCT: 41.2 % (ref 36.0–46.0)
Hemoglobin: 13.3 g/dL (ref 12.0–15.0)
MCHC: 32.2 g/dL (ref 30.0–36.0)
MCV: 89.2 fL (ref 78.0–100.0)
Platelets: 208 10*3/uL (ref 150.0–400.0)
RBC: 4.62 Mil/uL (ref 3.87–5.11)
RDW: 13.3 % (ref 11.5–15.5)
WBC: 4.7 10*3/uL (ref 4.0–10.5)

## 2023-05-03 LAB — LIPASE: Lipase: 31 U/L (ref 11.0–59.0)

## 2023-05-03 LAB — COMPREHENSIVE METABOLIC PANEL
ALT: 21 U/L (ref 0–35)
AST: 21 U/L (ref 0–37)
Albumin: 4.3 g/dL (ref 3.5–5.2)
Alkaline Phosphatase: 76 U/L (ref 39–117)
BUN: 11 mg/dL (ref 6–23)
CO2: 29 meq/L (ref 19–32)
Calcium: 10 mg/dL (ref 8.4–10.5)
Chloride: 103 meq/L (ref 96–112)
Creatinine, Ser: 0.9 mg/dL (ref 0.40–1.20)
GFR: 70.69 mL/min (ref >60.00)
Glucose, Bld: 88 mg/dL (ref 70–99)
Potassium: 4.5 meq/L (ref 3.5–5.1)
Sodium: 139 meq/L (ref 135–145)
Total Bilirubin: 0.3 mg/dL (ref 0.2–1.2)
Total Protein: 7.2 g/dL (ref 6.0–8.3)

## 2023-05-03 LAB — FECAL OCCULT BLOOD, IMMUNOCHEMICAL: Lab: NEGATIVE

## 2023-05-03 MED ORDER — METRONIDAZOLE 500 MG PO TABS: 500.0000 mg | ORAL_TABLET | Freq: Three times a day (TID) | ORAL | 0 refills | Status: DC

## 2023-05-03 MED ORDER — CIPROFLOXACIN HCL 500 MG PO TABS: 500.0000 mg | ORAL_TABLET | Freq: Two times a day (BID) | ORAL | 0 refills | Status: DC

## 2023-05-03 MED ORDER — IOHEXOL 300 MG/ML  SOLN
100.0000 mL | Freq: Once | INTRAMUSCULAR | Status: AC | PRN
  Administered 2023-05-03: 80 mL via INTRAVENOUS

## 2023-05-03 NOTE — Telephone Encounter (Signed)
Pt scheduled w/ Dr. Patsy Lager today

## 2023-05-03 NOTE — Addendum Note (Signed)
Addended by: Pearline Cables on: 05/03/2023 06:34 PM   Modules accepted: Orders

## 2023-05-03 NOTE — Telephone Encounter (Signed)
Initial Comment Caller states she has GI pain. Caller states she is having sharp pains in the lower left part of her abdominal area. Caller states her last temp was 98.0. Translation No Nurse Assessment Nurse: Rennis Petty, RN, Clydie Braun Date/Time Lamount Cohen Time): 05/03/2023 9:02:32 AM Confirm and document reason for call. If symptomatic, describe symptoms. ---Caller states that she is having some lower, left abd pain. It is a little better today, was 8/10 on Saturday and into Sunday. States she does see a GI doctor, has some diverticulum and has had Diverticulitis a few years back. Sx started with mild pain and a little nausea on Thursday. Had fever of 100 on Saturday, it went away. States has some mucus in the stool and coffee ground look but took [employer]. It was causing pain to walk. Had no appetite, ate brat diet yesterday. Pain today, 4-5/10 when moving around. Is passing gas and having BMs, hurts in that location. No ovary on that side. Does the patient have any new or worsening symptoms? ---Yes Will a triage be completed? ---Yes Related visit to physician within the last 2 weeks? ---No Does the PT have any chronic conditions? (i.e. diabetes, asthma, this includes High risk factors for pregnancy, etc.) ---Yes List chronic conditions. ---Diverticulum, Anxiety, Depression, neck pain, migraines. Is this a behavioral health or substance abuse call? ---No PLEASE NOTE: All timestamps contained within this report are represented as [place] Standard Time. CONFIDENTIALTY NOTICE: This fax transmission is intended only for the addressee. It contains information that is legally privileged, confidential or otherwise protected from use or disclosure. If you are not the intended recipient, you are strictly prohibited from reviewing, disclosing, copying using or disseminating any of this information or taking any action in reliance on or regarding this information. If you have received this fax in  error, please notify us immediately by telephone so that we can arrange for its return to [place]. Phone: [phone], Toll-Free: [phone], Fax: [phone] Page: 2 of 2 Call Id: [id number] Guidelines Guideline Title Affirmed Question Affirmed Notes Nurse Date/Time Lamount Cohen Time) Abdominal Pain - Female [1] MILDMODERATE pain AND [2] constant AND [3] present > 2 hours Gaynelle Adu 05/03/2023 9:07:29 AM Disp. Time Lamount Cohen Time) Disposition Final User 05/03/2023 9:12:02 AM See HCP within 4 Hours (or PCP triage) Yes Rennis Petty, RN, Clydie Braun Final Disposition 05/03/2023 9:12:02 AM See HCP within 4 Hours (or PCP triage) Yes Rennis Petty, RN, Pablo Ledger Disagree/Comply Comply Caller Understands Yes PreDisposition InappropriateToAsk Care Advice Given Per Guideline SEE HCP (OR PCP TRIAGE) WITHIN 4 HOURS: * IF OFFICE WILL BE OPEN: You need to be seen within the next 3 or 4 hours. Call your doctor (or NP/PA) now or as soon as the office opens. REST: * Lie down. CALL BACK IF: * You become worse CARE ADVICE given per Abdominal Pain - Female (Adult) guideline. Comments User: Loletta Specter, RN Date/Time Lamount Cohen Time): 05/03/2023 9:08:44 AM Finished Doxycycline on the 15th of Nov for Bronchitis. User: Loletta Specter, RN Date/Time Lamount Cohen Time): 05/03/2023 9:17:26 AM Warm transferred to [facility] at the office for an appt in the next 3-4 hours. Appt made for today at 1140 with Dr. Dallas Schimke. Referrals Warm transfer to backline

## 2023-05-03 NOTE — Progress Notes (Addendum)
[facility] [address], [address] [place], [place] [address] [phone]  Date:  05/03/2023   Name:  Pamela Sanders   DOB:  1966-05-31   MRN:  [id number]  PCP:  Sandford Craze, NP    Chief Complaint: low abd pain (Pt states she is having sharp pains in the lower left part of her abdomen. Temp sat night got up to 1010. Sxs started Friday 04/30/23. Pt says she has had diverticulitis before. )   History of Present Illness:  Pamela Sanders is a 57 y.o. very pleasant female patient who presents with the following:  Pt seen today with concern of LLQ pain- primary patient of my partner Sandford Craze NP She sent Melissa a message yesterday-  Hi Melissa, I'm having moderate colon pain (last night an 8, this morning a 5-6), worse on left side. This started ~3 days ago but intermittent to the point of it not a being a big issue until last night. Feels like I've been punched in the gut, irritated and somewhat cramping feeling. Painful when straining, stool sometimes has a little mucus, this morning looked like light colored coffee grounds. No blood. Ran a 100oF fever last night, first time, and dizzy/shivering, took Tylenol but ok this morning.  Appetite not great but able to eat, no nausea.  I requested an appointment with my GI doc but hard to get in to see him.  What do you suggest? Would like to come into your office tomorrow if possible unless I take a turn for the worst.   History of "borderline diabetes," arthritis, migraine HA.  She notes she tends to have GI symtpsom with nausea, maybe IBS on a regular basis She notes LLQ discomfort since last Thursday- today is Monday She had a temp to about 100 on Saturday night and more severe pain that night, she almost went to the ER! She continued to have stools but they were painful and had mucus Pain eased off a bit on Sunday/ yesterday- fever resolved She has been using a brat diet the  last couple of days No vomiting at all  No blood in her stool that she could see- it was dark but she was using pepto   Pt notes she did have diverticulitis several years ago And her last colon showed diverticulosis  She is s/p appendectomy and salpingectomy, oophorectomy      Patient Active Problem List   Diagnosis Date Noted   Sinusitis 04/28/2023   Anxiety 07/22/2022   Hyperglycemia 06/02/2022   Hypercalcemia 06/02/2022   Borderline diabetes 02/06/2022   Status post cervical spinal fusion 10/14/2020   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 08/29/2020   DJD (degenerative joint disease)    GERD (gastroesophageal reflux disease)    Osteoarthritis    Pain of right sternoclavicular joint    Tremor of both hands 03/08/2020   Sternoclavicular joint pain, right 10/15/2019   Insomnia 10/16/2015   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 Overview:  Qualifier: Diagnosis of  By: Andrey Campanile MD, LauraLee   Chest pain 12/31/2019   Chest tightness 01/29/2020   Chronic inflammatory arthritis 03/07/2008   Qualifier: Diagnosis of  By: Andrey Campanile MD, Carrie Mew of this note might be different from the original. Overview:  Qualifier: Diagnosis of  By: Andrey Campanile MD, LauraLee   Depression    Depression (emotion)    DIVERTICULITIS, HX OF 03/07/2008   Qualifier: Diagnosis of  By:  Andrey Campanile MD, LauraLee     DJD (degenerative joint disease)    chronic neck pain   General medical examination 12/03/2010   GERD 03/07/2008   Qualifier: Diagnosis of  By: Andrey Campanile MD, Carrie Mew of this note might be different from the original. Overview:  Qualifier: Diagnosis of  By: Andrey Campanile MD, Raliegh Ip   GERD (gastroesophageal reflux disease)    Hip pain    History of diverticulitis of colon    Hx of diverticulitis of colon    Insomnia    Lateral epicondylitis of right elbow    Leukocytopenia    Migraine    Migraine without aura 03/07/2008   Qualifier: Diagnosis of  By: Andrey Campanile MD, LauraLee     Neck pain, chronic    NECK PAIN, CHRONIC 03/07/2008   Qualifier: Diagnosis of  By: Andrey Campanile MD, LauraLee     Osteoarthritis    Pain of left thumb    Pain of right sternoclavicular joint    Phantosmia    PONV (postoperative nausea and vomiting)    Routine general medical examination at a health care facility 08/09/2013   Sinusitis    Sternoclavicular joint pain, right 10/15/2019   TMJ (dislocation of temporomandibular joint)    Tremor of both hands 03/08/2020    Past Surgical History:  Procedure Laterality Date   APPENDECTOMY     BLEPHAROPLASTY Bilateral 09/24/2022   CERVICAL ABLATION  05/2021   CERVICAL DISCECTOMY     Dr. Wyline Mood 11/2009- [facility]   COLONOSCOPY  08/03/2014   ESOPHAGOGASTRODUODENOSCOPY     Says it was normal but maybe a peptic ulcer. Early 2000's possibly [facility]   HEMORROIDECTOMY     LAPAROSCOPIC BILATERAL SALPINGO OOPHERECTOMY N/A 10/15/2021   Procedure: LAPAROSCOPIC BILATERAL SALPINGOOPHERECTOMY AND REMOVAL OF UTERINE MASS/PELVIC WASHINGS;  Surgeon: Shea Evans, MD;  Location: [facility] OR;  Service: Gynecology;  Laterality: N/A;   OVARIAN CYST SURGERY     abdominal surgery for ovarian cyst   TISSUE GRAFT  02/07/2015   pt reports gum graft for receeding gums--Dr Lucky Cowboy   TMJ ARTHROSCOPY     TMJ surgery    Social History   Tobacco Use   Smoking status: Never    Smokeless tobacco: Never  Vaping Use   Vaping status: Never Used  Substance Use Topics   Alcohol use: Yes    Alcohol/week: 0.0 standard drinks of alcohol    Comment: 2 drinks weekly, wine   Drug use: No    Family History  Problem Relation Age of Onset   Arthritis Other    Breast cancer Other    Coronary artery disease Maternal Grandmother    Breast cancer Maternal Grandmother        carcinoid tumor in abd   Hypertension Other    Diverticulitis Mother    Arthritis Mother    Pancreatic cancer Other     Allergies  Allergen Reactions  Penicillins Anaphylaxis   Celebrex [Celecoxib]     Headaches   Shingrix [Zoster Vac Recomb Adjuvanted]     Fever, myalgia, local redness    Medication list has been reviewed and updated.  Current Outpatient Medications on File Prior to Visit  Medication Sig Dispense Refill   b complex vitamins capsule Take 1 capsule by mouth 2 (two) times a week.     buPROPion (WELLBUTRIN XL) 300 MG 24 hr tablet Take 1 tablet (300 mg total) by mouth daily. 90 tablet 1   busPIRone (BUSPAR) 5 MG tablet Take 1 tablet (5 mg total) by mouth 2 (two) times daily. 180 tablet 1   calcium citrate-vitamin D (CITRACAL+D) 315-200 MG-UNIT per tablet Take 2 tablets by mouth daily.     CAPSAICIN EX Apply 1 application. topically daily as needed (muscle pain).     cyclobenzaprine (FLEXERIL) 5 MG tablet Take 5 mg by mouth as needed for muscle spasms.     diphenhydrAMINE (BENADRYL) 25 MG tablet Take 25 mg by mouth daily as needed for itching or allergies.      famotidine (PEPCID AC) 10 MG chewable tablet Chew 10 mg by mouth 2 (two) times daily as needed for heartburn.     gabapentin (NEURONTIN) 300 MG capsule Take 600 mg by mouth 2 (two) times a day. 360 capsule 1   hyoscyamine (LEVSIN SL) 0.125 MG SL tablet Place 1 tablet (0.125 mg total) under the tongue every 4 (four) hours as needed. 30 tablet 0   ibuprofen (ADVIL) 200 MG tablet Take 200 mg by mouth every 6 (six) hours as  needed.     loratadine (CLARITIN) 10 MG tablet Take 10 mg by mouth daily.     Multiple Vitamins-Minerals (ONE-A-DAY WOMENS 50 PLUS) TABS Take 1 tablet by mouth daily.     primidone (MYSOLINE) 50 MG tablet Take 1 tablet (50 mg total) by mouth at bedtime. 30 tablet 11   Psyllium (METAMUCIL PO) Take 0.5 fluid ounces by mouth daily.     Ubrogepant (UBRELVY) 100 MG TABS Take 1 tablet (100 mg total) by mouth as needed (Take 1 tab at the earlist onset of a migraine, May repeat in 2 hours. Max 2 tabs in 24 hours). 16 tablet 11   zolpidem (AMBIEN) 5 MG tablet Take 1 tablet (5 mg total) by mouth at bedtime as needed. for sleep 30 tablet 2   No current facility-administered medications on file prior to visit.    Review of Systems:  As per HPI- otherwise negative.   Physical Examination: Vitals:   05/03/23 1139  BP: 110/74  Pulse: 73  Resp: 18  Temp: 97.9 F (36.6 C)  SpO2: 97%   Vitals:   05/03/23 1139  Weight: 172 lb (78 kg)  Height: 5\' 7"  (1.702 m)   Body mass index is 26.94 kg/m. Ideal Body Weight: Weight in (lb) to have BMI = 25: 159.3  GEN: no acute distress.  Minimal overweight, looks well HEENT: Atraumatic, Normocephalic.  Ears and Nose: No external deformity. CV: RRR, No M/G/R. No JVD. No thrill. No extra heart sounds. PULM: CTA B, no wheezes, crackles, rhonchi. No retractions. No resp. distress. No accessory muscle use. ABD: S, ND, +BS. No rebound. No HSM.  Significant left lower quadrant tenderness on exam today EXTR: No c/c/e PSYCH: Normally interactive. Conversant.   Results for orders placed or performed in visit on 05/03/23  Fecal occult blood, imunochemical  Result Value Ref Range     Negative   CBC  Result Value Ref Range   WBC 4.7 4.0 - 10.5 K/uL   RBC 4.62 3.87 - 5.11 Mil/uL   Platelets 208.0 150.0 - 400.0 K/uL   Hemoglobin 13.3 12.0 - 15.0 g/dL   HCT 40.9 81.1 - 91.4 %   MCV 89.2 78.0 - 100.0 fl   MCHC 32.2 30.0 - 36.0 g/dL   RDW 78.2 95.6 - 21.3 %   Comprehensive metabolic panel  Result Value Ref Range   Sodium 139 135 - 145 mEq/L   Potassium 4.5 3.5 - 5.1 mEq/L   Chloride 103 96 - 112 mEq/L   CO2 29 19 - 32 mEq/L   Glucose, Bld 88 70 - 99 mg/dL   BUN 11 6 - 23 mg/dL   Creatinine, Ser 0.86 0.40 - 1.20 mg/dL   Total Bilirubin 0.3 0.2 - 1.2 mg/dL   Alkaline Phosphatase 76 39 - 117 U/L   AST 21 0 - 37 U/L   ALT 21 0 - 35 U/L   Total Protein 7.2 6.0 - 8.3 g/dL   Albumin 4.3 3.5 - 5.2 g/dL   GFR 57.84 >69.62 mL/min   Calcium 10.0 8.4 - 10.5 mg/dL  Lipase  Result Value Ref Range   Lipase 31.0 11.0 - 59.0 U/L    Assessment and Plan: Acute diverticulitis - Plan: ciprofloxacin (CIPRO) 500 MG tablet, metroNIDAZOLE (FLAGYL) 500 MG tablet  LLQ pain - Plan: CBC, Comprehensive metabolic panel, Lipase, Fecal occult blood, imunochemical, CT ABDOMEN PELVIS W CONTRAST  Patient seen today with exam and history concerning for diverticulitis, she notes fever 2 days ago, and persistent pain which is not getting better yet.  Suspicion for possible complication such as abscess.  We will obtain a CT abdomen pelvis today.  Will obtain blood work now in preparation for CT scan  Signed Abbe Amsterdam, MD  Received CT scan as below, called patient to go over results  CT ABDOMEN PELVIS W CONTRAST  Result Date: 05/03/2023 CLINICAL DATA:  Left lower quadrant pain EXAM: CT ABDOMEN AND PELVIS WITH CONTRAST TECHNIQUE: Multidetector CT imaging of the abdomen and pelvis was performed using the standard protocol following bolus administration of intravenous contrast. RADIATION DOSE REDUCTION: This exam was performed according to the departmental dose-optimization program which includes automated exposure control, adjustment of the mA and/or kV according to patient size and/or use of iterative reconstruction technique. CONTRAST:  80mL OMNIPAQUE IOHEXOL 300 MG/ML  SOLN COMPARISON:  10/12/2021 FINDINGS: Lower chest: No acute abnormality Hepatobiliary: No focal  hepatic abnormality. Gallbladder unremarkable. Pancreas: No focal abnormality or ductal dilatation. Spleen: No focal abnormality.  Normal size. Adrenals/Urinary Tract: No adrenal abnormality. No focal renal abnormality. No stones or hydronephrosis. Urinary bladder is unremarkable. Stomach/Bowel: Sigmoid diverticulosis. Hazy stranding around the proximal sigmoid colon compatible with early active diverticulitis. No complicating feature. No bowel obstruction. Vascular/Lymphatic: No evidence of aneurysm or adenopathy. Reproductive: Uterus and adnexa unremarkable.  No mass. Other: No free fluid or free air. Musculoskeletal: No acute bony abnormality. IMPRESSION: Sigmoid diverticulosis. Inflammatory stranding around the proximal sigmoid colon compatible with active diverticulitis. No complicating feature. Electronically Signed   By: Charlett Nose M.D.   On: 05/03/2023 17:46     She is penicillin allergic so we will treat with Cipro and Flagyl due to persistent and worsening symptoms Recommended pushing fluids, soft low residue diet if she feels hungry.  Please contact me if not improving over the next 1 to 2 days, sooner if getting worse

## 2023-05-03 NOTE — Patient Instructions (Signed)
It was nice to meet you today, I am sorry you are feeling so bad!  Please go to the lab and then you can head home, we will work on getting a CT scan set up for this afternoon

## 2023-05-04 ENCOUNTER — Ambulatory Visit: Payer: PRIVATE HEALTH INSURANCE | Admitting: Family

## 2023-05-04 ENCOUNTER — Telehealth: Payer: Self-pay | Admitting: Family

## 2023-05-04 ENCOUNTER — Ambulatory Visit (HOSPITAL_BASED_OUTPATIENT_CLINIC_OR_DEPARTMENT_OTHER): Payer: PRIVATE HEALTH INSURANCE

## 2023-05-04 NOTE — Progress Notes (Unsigned)
Called patient. Left message advising her to call the office to schedule an appointment today.   Addendum: pt was seen yesterday by another provider.

## 2023-05-04 NOTE — Telephone Encounter (Signed)
See mychart.  

## 2023-05-19 ENCOUNTER — Encounter: Payer: Self-pay | Admitting: Family

## 2023-05-21 ENCOUNTER — Ambulatory Visit: Payer: PRIVATE HEALTH INSURANCE | Admitting: Family

## 2023-05-21 ENCOUNTER — Other Ambulatory Visit (HOSPITAL_BASED_OUTPATIENT_CLINIC_OR_DEPARTMENT_OTHER): Payer: Self-pay

## 2023-05-21 VITALS — BP 125/84 | HR 87 | Temp 98.1°F | Ht 67.0 in | Wt 174.0 lb

## 2023-05-21 DIAGNOSIS — J019 Acute sinusitis, unspecified: Secondary | ICD-10-CM | POA: Diagnosis not present

## 2023-05-21 MED ORDER — DOXYCYCLINE HYCLATE 100 MG PO TABS
100.0000 mg | ORAL_TABLET | Freq: Two times a day (BID) | ORAL | 0 refills | Status: DC
  Filled 2023-05-21: qty 14, 7d supply, fill #0

## 2023-05-21 NOTE — Patient Instructions (Signed)
VISIT SUMMARY:  During today's visit, we discussed your ongoing sinus symptoms, including headaches, ear fullness, and dizziness, which have persisted for over two weeks. We also reviewed your recent episode of diverticulitis, which has resolved. Additionally, we talked about general health maintenance, particularly focusing on gut health after antibiotic use.  YOUR PLAN:  -SINUSITIS: Sinusitis is an inflammation or swelling of the tissue lining the sinuses, often causing headaches, ear fullness, and dizziness. We will start you on Doxycycline, an antibiotic, to be taken twice daily for one week. Continue using Sudafed and nasal rinses. If you do not see improvement in one week, we may refer you to an ENT specialist.  -DIVERTICULITIS: Diverticulitis is an inflammation or infection of small pouches that can form in your intestines. Your recent episode has resolved with antibiotics, and you are currently asymptomatic. Continue with your current treatment plan.  -GENERAL HEALTH MAINTENANCE: To help restore your gut flora after recent antibiotic use, consider adding a probiotic supplement or increasing your intake of yogurt.  INSTRUCTIONS:  Please follow the prescribed treatment for sinusitis and continue with your current plan for diverticulitis. If your sinus symptoms do not improve in one week, contact our office for a possible referral to an ENT specialist.

## 2023-05-21 NOTE — Assessment & Plan Note (Signed)
  Persistent headache, ear fullness, and dizziness for over two weeks. No significant pain, but constant pressure. Ear examination shows serous effusion without erythema. -Prescribe Doxycycline twice daily for one week. (Pt is pen allergic) -Continue Sudafed and nasal rinses. -Consider ENT referral if no improvement.

## 2023-05-21 NOTE — Progress Notes (Signed)
Subjective:     Patient ID: Pamela Sanders, female    DOB: 13-Dec-1965, 57 y.o.   MRN: [id number]  Chief Complaint  Patient presents with   Sinus Problem    Complains of sinus pain and pressure for 2 weeks,    Ear Fullness    Complains of right ear feeling full    Discussed the use of AI scribe software for clinical note transcription with the patient, who gave verbal consent to proceed.  History of Present Illness   The patient, with a history of diverticulitis, migraines, tinnitus, and ovarian cysts (resolved post-oophorectomy), presents with persistent sinus symptoms for over two weeks. She describes daily headaches, which she differentiates from her usual migraines, localized to the temples and brow area. She also reports a sensation of right ear fullness, which has been constant and varying in intensity. This ear fullness has been associated with dizziness, severe enough to require Dramamine on one occasion. The patient has attempted self-management with Sudafed and nasal irrigation, but these have not provided relief. The patient also notes a history of tinnitus in the affected ear. The patient recently completed a course of antibiotics for diverticulitis, which resolved quickly. She has a remaining supply of antibiotics as she was instructed to stop once she felt better.          Health Maintenance Due  Topic Date Due   Cervical Cancer Screening (HPV/Pap Cotest)  06/28/2021   COVID-19 Vaccine (8 - 2024-25 season) 04/03/2023    Past Medical History:  Diagnosis Date   Allergic rhinitis 03/07/2008   Qualifier: Diagnosis of  By: Andrey Campanile MD, Carrie Mew of this note might be different from the original. Overview:  Qualifier: Diagnosis of  By: Andrey Campanile MD, LauraLee   Chest pain 12/31/2019   Chest tightness 01/29/2020   Chronic inflammatory arthritis 03/07/2008   Qualifier: Diagnosis of  By: Andrey Campanile MD, Carrie Mew of this note might be different from the  original. Overview:  Qualifier: Diagnosis of  By: Andrey Campanile MD, LauraLee   Depression    Depression (emotion)    DIVERTICULITIS, HX OF 03/07/2008   Qualifier: Diagnosis of  By: Andrey Campanile MD, LauraLee     DJD (degenerative joint disease)    chronic neck pain   General medical examination 12/03/2010   GERD 03/07/2008   Qualifier: Diagnosis of  By: Andrey Campanile MD, Carrie Mew of this note might be different from the original. Overview:  Qualifier: Diagnosis of  By: Andrey Campanile MD, Raliegh Ip   GERD (gastroesophageal reflux disease)    Hip pain    History of diverticulitis of colon    Hx of diverticulitis of colon    Insomnia    Lateral epicondylitis of right elbow    Leukocytopenia    Migraine    Migraine without aura 03/07/2008   Qualifier: Diagnosis of  By: Andrey Campanile MD, LauraLee     Neck pain, chronic    NECK PAIN, CHRONIC 03/07/2008   Qualifier: Diagnosis of  By: Andrey Campanile MD, LauraLee     Osteoarthritis    Pain of left thumb    Pain of right sternoclavicular joint    Phantosmia    PONV (postoperative nausea and vomiting)    Routine general medical examination at a health care facility 08/09/2013   Sinusitis    Sternoclavicular joint pain, right 10/15/2019   TMJ (dislocation of temporomandibular joint)    Tremor of both hands 03/08/2020    Past Surgical History:  Procedure Laterality Date   APPENDECTOMY     BLEPHAROPLASTY Bilateral 09/24/2022   CERVICAL ABLATION  05/2021   CERVICAL DISCECTOMY     Dr. Wyline Mood 11/2009- [facility]   COLONOSCOPY  08/03/2014   ESOPHAGOGASTRODUODENOSCOPY     Says it was normal but maybe a peptic ulcer. Early 2000's possibly [facility]   HEMORROIDECTOMY     LAPAROSCOPIC BILATERAL SALPINGO OOPHERECTOMY N/A 10/15/2021   Procedure: LAPAROSCOPIC BILATERAL SALPINGOOPHERECTOMY AND REMOVAL OF UTERINE MASS/PELVIC WASHINGS;  Surgeon: Shea Evans, MD;  Location: [facility] OR;  Service: Gynecology;  Laterality: N/A;   OVARIAN CYST SURGERY     abdominal surgery for ovarian cyst    TISSUE GRAFT  02/07/2015   pt reports gum graft for receeding gums--Dr Lucky Cowboy   TMJ ARTHROSCOPY     TMJ surgery    Family History  Problem Relation Age of Onset   Arthritis Other    Breast cancer Other    Coronary artery disease Maternal Grandmother    Breast cancer Maternal Grandmother        carcinoid tumor in abd   Hypertension Other    Diverticulitis Mother    Arthritis Mother    Pancreatic cancer Other     Social History   Socioeconomic History   Marital status: Married    Spouse name: Vernona Rieger   Number of children: 0   Years of education: Not on file   Highest education level: Doctorate  Occupational History   Occupation: Nurse, mental health: [employer]  Tobacco Use   Smoking status: Never   Smokeless tobacco: Never  Vaping Use   Vaping status: Never Used  Substance and Sexual Activity   Alcohol use: Yes    Alcohol/week: 0.0 standard drinks of alcohol    Comment: 2 drinks weekly, wine   Drug use: No   Sexual activity: Not on file  Other Topics Concern   Not on file  Social History Narrative   Domestic Partner   no children   She grew up in [place], [place]    Family moved to [place] when she was in [employer]   Occupation:lab research / Personal assistant     Regular Exercise:  3 x weekly   Caffeine Use:  2 cups coffee daily, 1 tea      Patient is right-handed. She lives with her wife in a 2 story home. She drinks 1-2 cups of coffee a day and 1-2 glasses of tea. She exercises regularly.   Social Drivers of Corporate investment banker Strain: Low Risk  (05/20/2023)   Overall Financial Resource Strain (CARDIA)    Difficulty of Paying Living Expenses: Not hard at all  Food Insecurity: No Food Insecurity (05/20/2023)   Hunger Vital Sign    Worried About Running Out of Food in the Last Year: Never true    Ran Out of Food in the Last Year: Never true  Transportation Needs: No Transportation Needs (05/20/2023)   PRAPARE - Therapist, art (Medical): No    Lack of Transportation (Non-Medical): No  Physical Activity: Insufficiently Active (05/20/2023)   Exercise Vital Sign    Days of Exercise per Week: 2 days    Minutes of Exercise per Session: 30 min  Stress: Stress Concern Present (05/20/2023)   [employer] of Occupational Health - Occupational Stress Questionnaire    Feeling of Stress : To some extent  Social Connections: Moderately Isolated (05/20/2023)   Social Connection and Isolation  Panel [NHANES]    Frequency of Communication with Friends and Family: Three times a week    Frequency of Social Gatherings with Friends and Family: Once a week    Attends Religious Services: Never    Database administrator or Organizations: No    Attends Engineer, structural: Not on file    Marital Status: Married  Catering manager Violence: Not on file    Outpatient Medications Prior to Visit  Medication Sig Dispense Refill   b complex vitamins capsule Take 1 capsule by mouth 2 (two) times a week.     buPROPion (WELLBUTRIN XL) 300 MG 24 hr tablet Take 1 tablet (300 mg total) by mouth daily. 90 tablet 1   busPIRone (BUSPAR) 5 MG tablet Take 1 tablet (5 mg total) by mouth 2 (two) times daily. 180 tablet 1   calcium citrate-vitamin D (CITRACAL+D) 315-200 MG-UNIT per tablet Take 2 tablets by mouth daily.     CAPSAICIN EX Apply 1 application. topically daily as needed (muscle pain).     cyclobenzaprine (FLEXERIL) 5 MG tablet Take 5 mg by mouth as needed for muscle spasms.     diphenhydrAMINE (BENADRYL) 25 MG tablet Take 25 mg by mouth daily as needed for itching or allergies.      famotidine (PEPCID AC) 10 MG chewable tablet Chew 10 mg by mouth 2 (two) times daily as needed for heartburn.     gabapentin (NEURONTIN) 300 MG capsule Take 600 mg by mouth 2 (two) times a day. 360 capsule 1   hyoscyamine (LEVSIN SL) 0.125 MG SL tablet Place 1 tablet (0.125 mg total) under the tongue every 4 (four) hours as  needed. 30 tablet 0   ibuprofen (ADVIL) 200 MG tablet Take 200 mg by mouth every 6 (six) hours as needed.     loratadine (CLARITIN) 10 MG tablet Take 10 mg by mouth daily.     Multiple Vitamins-Minerals (ONE-A-DAY WOMENS 50 PLUS) TABS Take 1 tablet by mouth daily.     primidone (MYSOLINE) 50 MG tablet Take 1 tablet (50 mg total) by mouth at bedtime. 30 tablet 11   Psyllium (METAMUCIL PO) Take 0.5 fluid ounces by mouth daily.     Ubrogepant (UBRELVY) 100 MG TABS Take 1 tablet (100 mg total) by mouth as needed (Take 1 tab at the earlist onset of a migraine, May repeat in 2 hours. Max 2 tabs in 24 hours). 16 tablet 11   zolpidem (AMBIEN) 5 MG tablet Take 1 tablet (5 mg total) by mouth at bedtime as needed. for sleep 30 tablet 2   ciprofloxacin (CIPRO) 500 MG tablet Take 1 tablet (500 mg total) by mouth 2 (two) times daily. 20 tablet 0   metroNIDAZOLE (FLAGYL) 500 MG tablet Take 1 tablet (500 mg total) by mouth 3 (three) times daily. NO alcohol 30 tablet 0   No facility-administered medications prior to visit.    Allergies  Allergen Reactions   Penicillins Anaphylaxis   Celebrex [Celecoxib]     Headaches   Shingrix [Zoster Vac Recomb Adjuvanted]     Fever, myalgia, local redness    ROS    See HPI Objective:    Physical Exam Constitutional:      General: She is not in acute distress.    Appearance: Normal appearance. She is well-developed.  HENT:     Head: Normocephalic and atraumatic.     Right Ear: Ear canal and external ear normal. A middle ear effusion is present. Tympanic membrane is  not erythematous.     Left Ear: Tympanic membrane, ear canal and external ear normal. Tympanic membrane is not erythematous.     Nose:     Right Sinus: Maxillary sinus tenderness and frontal sinus tenderness present.     Left Sinus: No maxillary sinus tenderness or frontal sinus tenderness.     Comments: Mild right sided sinus tenderness Eyes:     General: No scleral icterus. Neck:     Thyroid:  No thyromegaly.  Cardiovascular:     Rate and Rhythm: Normal rate and regular rhythm.     Heart sounds: Normal heart sounds. No murmur heard. Pulmonary:     Effort: Pulmonary effort is normal. No respiratory distress.     Breath sounds: Normal breath sounds. No wheezing.  Musculoskeletal:     Cervical back: Neck supple.  Skin:    General: Skin is warm and dry.  Neurological:     Mental Status: She is alert and oriented to person, place, and time.  Psychiatric:        Mood and Affect: Mood normal.        Behavior: Behavior normal.        Thought Content: Thought content normal.        Judgment: Judgment normal.      BP 125/84 (BP Location: Right Arm, Patient Position: Sitting, Cuff Size: Small)   Pulse 87   Temp 98.1 F (36.7 C) (Oral)   Ht 5\' 7"  (1.702 m)   Wt 174 lb (78.9 kg)   BMI 27.25 kg/m  Wt Readings from Last 3 Encounters:  05/21/23 174 lb (78.9 kg)  05/03/23 172 lb (78 kg)  04/19/23 176 lb 12.8 oz (80.2 kg)       Assessment & Plan:   Problem List Items Addressed This Visit       Unprioritized   Sinusitis - Primary    Persistent headache, ear fullness, and dizziness for over two weeks. No significant pain, but constant pressure. Ear examination shows serous effusion without erythema. -Prescribe Doxycycline twice daily for one week. (Pt is pen allergic) -Continue Sudafed and nasal rinses. -Consider ENT referral if no improvement.      Relevant Medications   doxycycline (VIBRA-TABS) 100 MG tablet    I have discontinued Odesser Zobel's ciprofloxacin and metroNIDAZOLE. I am also having her maintain her calcium citrate-vitamin D, loratadine, famotidine, b complex vitamins, diphenhydrAMINE, Psyllium (METAMUCIL PO), CAPSAICIN EX, One-A-Day Womens 50 Plus, cyclobenzaprine, hyoscyamine, ibuprofen, zolpidem, primidone, Ubrelvy, busPIRone, buPROPion, gabapentin, and doxycycline.  Meds ordered this encounter  Medications   doxycycline (VIBRA-TABS) 100 MG tablet     Sig: Take 1 tablet (100 mg total) by mouth 2 (two) times daily.    Dispense:  14 tablet    Refill:  0    Supervising Provider:   Danise Edge A [[id number]]

## 2023-07-04 ENCOUNTER — Encounter: Payer: Self-pay | Admitting: Family

## 2023-07-04 MED ORDER — ZOLPIDEM TARTRATE 5 MG PO TABS: 5.0000 mg | ORAL_TABLET | Freq: Every evening | ORAL | 0 refills | Status: DC | PRN

## 2023-07-07 NOTE — Telephone Encounter (Signed)
Medication refilled

## 2023-08-02 ENCOUNTER — Encounter: Payer: Self-pay | Admitting: Family

## 2023-08-02 DIAGNOSIS — H9319 Tinnitus, unspecified ear: Secondary | ICD-10-CM

## 2023-08-03 NOTE — Telephone Encounter (Signed)
 Pended.

## 2023-08-04 ENCOUNTER — Encounter: Payer: Self-pay | Admitting: Family

## 2023-08-04 DIAGNOSIS — Z0184 Encounter for antibody response examination: Secondary | ICD-10-CM

## 2023-08-23 ENCOUNTER — Other Ambulatory Visit (INDEPENDENT_AMBULATORY_CARE_PROVIDER_SITE_OTHER): Payer: PRIVATE HEALTH INSURANCE

## 2023-08-23 DIAGNOSIS — Z0184 Encounter for antibody response examination: Secondary | ICD-10-CM | POA: Diagnosis not present

## 2023-08-24 LAB — MEASLES/MUMPS/RUBELLA IMMUNITY
Mumps IgG: 191 [AU]/ml
Rubella: 10.6 {index}
Rubeola IgG: 57 [AU]/ml

## 2023-08-24 NOTE — Addendum Note (Signed)
 Addended by: Sandford Craze on: 08/24/2023 10:29 AM   Modules accepted: Orders

## 2023-08-25 ENCOUNTER — Encounter: Payer: Self-pay | Admitting: Family

## 2023-10-04 ENCOUNTER — Encounter: Payer: Self-pay | Admitting: Family

## 2023-10-04 MED ORDER — BUSPIRONE HCL 5 MG PO TABS: 5.0000 mg | ORAL_TABLET | Freq: Two times a day (BID) | ORAL | 0 refills | Status: DC

## 2023-10-08 ENCOUNTER — Telehealth: Payer: Self-pay | Admitting: Pharmacy Technician

## 2023-10-08 ENCOUNTER — Other Ambulatory Visit (HOSPITAL_COMMUNITY): Payer: Self-pay

## 2023-10-08 NOTE — Telephone Encounter (Signed)
 Pharmacy Patient Advocate Encounter   Received notification from CoverMyMeds that prior authorization for UBRELVY  100MG  is required/requested.   Insurance verification completed.   The patient is insured through [facility] .   Per test claim: PA required; PA submitted to above mentioned insurance via CoverMyMeds Key/confirmation #/EOC [id number] Status is pending

## 2023-10-12 ENCOUNTER — Other Ambulatory Visit (HOSPITAL_COMMUNITY): Payer: Self-pay

## 2023-10-12 NOTE — Telephone Encounter (Signed)
 Pharmacy Patient Advocate Encounter  Received notification from [employer] that Prior Authorization for UBRELVY  100MG  has been APPROVED from 5.2.25 to 5.2.27. Ran test claim, Copay is $0. This test claim was processed through [facility]- copay amounts may vary at other pharmacies due to pharmacy/plan contracts, or as the patient moves through the different stages of their insurance plan.   PA #/Case ID/Reference #: [id number]

## 2023-10-21 ENCOUNTER — Other Ambulatory Visit: Payer: Self-pay | Admitting: Family

## 2023-10-23 ENCOUNTER — Encounter: Payer: Self-pay | Admitting: Family

## 2023-10-24 MED ORDER — BUPROPION HCL ER (XL) 300 MG PO TB24: 300.0000 mg | ORAL_TABLET | Freq: Every day | ORAL | 1 refills | Status: DC

## 2023-10-24 MED ORDER — GABAPENTIN 300 MG PO CAPS: 600.0000 mg | ORAL_CAPSULE | Freq: Two times a day (BID) | ORAL | 1 refills | Status: DC

## 2023-11-04 ENCOUNTER — Ambulatory Visit (INDEPENDENT_AMBULATORY_CARE_PROVIDER_SITE_OTHER): Payer: PRIVATE HEALTH INSURANCE | Admitting: Otolaryngology

## 2023-11-04 ENCOUNTER — Encounter (INDEPENDENT_AMBULATORY_CARE_PROVIDER_SITE_OTHER): Payer: Self-pay | Admitting: Otolaryngology

## 2023-11-04 ENCOUNTER — Ambulatory Visit (INDEPENDENT_AMBULATORY_CARE_PROVIDER_SITE_OTHER): Payer: PRIVATE HEALTH INSURANCE | Admitting: Audiology

## 2023-11-04 VITALS — BP 130/92 | HR 71 | Ht 67.0 in | Wt 177.0 lb

## 2023-11-04 DIAGNOSIS — H9311 Tinnitus, right ear: Secondary | ICD-10-CM

## 2023-11-04 DIAGNOSIS — H938X1 Other specified disorders of right ear: Secondary | ICD-10-CM

## 2023-11-04 DIAGNOSIS — H903 Sensorineural hearing loss, bilateral: Secondary | ICD-10-CM

## 2023-11-04 NOTE — Progress Notes (Signed)
  [address], [address] [place], [place] [address] [phone]  Audiological Evaluation    Name: Pamela Sanders     DOB:   1965/06/16      MRN:   [id number]                                                                                     Service Date: 11/04/2023     Accompanied by: unaccompanied    Patient comes today after Dr. Lydia Sams, ENT sent a referral for a hearing evaluation due to concerns with hearing loss asymmetry.   Symptoms Yes Details  Hearing loss  [x]  Right side is worse since a "viral infection" in 2014  Tinnitus  [x]  Since 2014  - high pitched and changes to hissing when she feels the spinning sensation.  Ear pain/ infections/pressure  [x]  Reports some right fullness with the spinning sensation  Balance problems  [x]  Reports spinning sensation  that she reports to last seconds, last one was last week and reports to be triggered by standing up ( on/off).  Noise exposure history  []    Previous ear surgeries  []    Family history of hearing loss  []    Amplification  []    Other  [x]  Weekly headaches and reports used to have migraines    Otoscopy: Right ear: Clear external ear canals and notable landmarks visualized on the tympanic membrane. Left ear:  Clear external ear canals and notable landmarks visualized on the tympanic membrane.  Tympanometry: Right ear: Type A- Normal external ear canal volume with normal middle ear pressure and tympanic membrane compliance. Left ear: Type A- Normal external ear canal volume with normal middle ear pressure and tympanic membrane compliance.  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 Left ear-  Normal hearing from [phone] Hz, except for a mild sensorineural hearing loss from [phone]   Speech Audiometry: Right ear- Speech Reception Threshold (SRT) was obtained at 25 dBHL. Left ear-Speech Reception Threshold (SRT) was obtained  at 20 dBHL.   Word Recognition Score Tested using NU-6 (MLV) Right ear: 80% was obtained at a presentation level of 80 dBHL with contralateral masking which is deemed as  good . Left ear: 88% was obtained at a presentation level of 70 dBHL with contralateral masking which is deemed as  excellent.   The hearing test results were completed under headphones and re-checked with inserts and results are deemed to be of good reliability. Test technique:  conventional    Impression: There is a significant difference in pure-tone thresholds between ears, worse in the right ear.   Recommendations: Follow up with ENT as scheduled for today. Return for a hearing evaluation if concerns with hearing changes arise or per MD recommendation. Consider a communication needs assessment after medical clearance for hearing aids is obtained.   Lizzy Hamre MARIE LEROUX-MARTINEZ, AUD

## 2023-11-04 NOTE — Progress Notes (Signed)
 Dear Dr. Fleming Hum, Here is my assessment for our mutual patient, Pamela Sanders. Thank you for allowing me the opportunity to care for your patient. Please do not hesitate to contact me should you have any other questions. Sincerely, Dr. Milon Sanders  Otolaryngology Clinic Note Referring provider: Dr. Fleming Hum HPI:  Pamela Sanders is a 58 y.o. female kindly referred by Dr. Fleming Hum for evaluation of tinnitus and hearing loss  Initial visit (10/2023): Patient reports: noted tinnitus ("high pitch" static, not roaring) in right ear since 2014, reported after viral infection. Not pulsatile.  She now reports some right sided fullness and imbalance (denies frank vertigo), perhaps slight roaring - "just needs to catch myself when getting up" - since February 2025 and some right headache. Now much less frequent. Lasts <24 hours, but more than seconds or minutes (except when standing up). Denies fluctuating hearing loss, worsening tinnitus.  Does have headaches and getting botox prior and now on Ubrelvy .  No recent medication changes. No triggers. No vision changes, joint pain.  No antecedent event such as URI. Saw [facility] for this. No diet or pressure/temp triggers as triggers. Tried nasal sprays, did not help.Tried abx (doxy Dec 2024, did not help) Her main concern appears to be tinnitus today.   Patient denies: ear pain, vertigo, drainage Patient additionally denies: deep pain in ear canal, eustachian tube symptoms such as popping/crackling, sensitive to pressure changes Patient also denies barotrauma, vestibular suppressant use, ototoxic medication use Prior ear surgery: no  H&N Surgery: no Personal or FHx of bleeding dz or anesthesia difficulty: no  GLP-1: no AP/AC: denies  Tobacco: no  PMHx: Neck pain s/p ACDF, Migraines, OA, GERD, TMJ  Independent Review of Additional Tests or Records:  Dr. Hildy Lowers (10/09/2019):  right tinnitus, non-pulsatile. 10 years ago; no other ear problems.  Has had prior imaging for this, so did not do imaging; rec recheck hearing 1 year. Had audio in 2021 as well  [facility]): intermittent tinnitus, "rushing sound, some vertigo Feb 2025 reported; Possible meniere's dx, low salt diet;  Audio 2025: outside, interpreted independently: left normal then downsloping to mild SNHL then back to normal; Right asymmetric mild downsloping to borderling profound HL; WRT 100% AS at 50dB; 88% AD at 75dB; A tymps 5:   10/2023 Audiogram was independently reviewed and interpreted by me and it reveals - noted symmetric normal down then mild SNHL then left upsloping to normal but right downsloping to severe SNHL; A/A tymps   SNHL= Sensorineural hearing loss  MRI Brain 2025 report reviewed, cannot access images:  PMH/Meds/All/SocHx/FamHx/ROS:   Past Medical History:  Diagnosis Date   Allergic rhinitis 03/07/2008   Qualifier: Diagnosis of  By: Elvan Hamel MD, Hartwell Linea of this note might be different from the original. Overview:  Qualifier: Diagnosis of  By: Elvan Hamel MD, LauraLee   Chest pain 12/31/2019   Chest tightness 01/29/2020   Chronic inflammatory arthritis 03/07/2008   Qualifier: Diagnosis of  By: Elvan Hamel MD, Hartwell Linea of this note might be different from the original. Overview:  Qualifier: Diagnosis of  By: Elvan Hamel MD, LauraLee   Depression    Depression (emotion)    DIVERTICULITIS, HX OF 03/07/2008   Qualifier: Diagnosis of  By: Elvan Hamel MD, LauraLee     DJD (degenerative joint disease)    chronic neck pain   General medical examination 12/03/2010   GERD 03/07/2008   Qualifier: Diagnosis of  By: Elvan Hamel MD, Hartwell Linea  of this note might be different from the original. Overview:  Qualifier: Diagnosis of  By: Elvan Hamel MD, Barnie Bora   GERD (gastroesophageal reflux disease)    Hip pain    History of diverticulitis of colon    Hx of diverticulitis of colon    Insomnia    Lateral epicondylitis of right elbow     Leukocytopenia    Migraine    Migraine without aura 03/07/2008   Qualifier: Diagnosis of  By: Elvan Hamel MD, LauraLee     Neck pain, chronic    NECK PAIN, CHRONIC 03/07/2008   Qualifier: Diagnosis of  By: Elvan Hamel MD, LauraLee     Osteoarthritis    Pain of left thumb    Pain of right sternoclavicular joint    Phantosmia    PONV (postoperative nausea and vomiting)    Routine general medical examination at a health care facility 08/09/2013   Sinusitis    Sternoclavicular joint pain, right 10/15/2019   TMJ (dislocation of temporomandibular joint)    Tremor of both hands 03/08/2020     Past Surgical History:  Procedure Laterality Date   APPENDECTOMY     BLEPHAROPLASTY Bilateral 09/24/2022   CERVICAL ABLATION  05/2021   CERVICAL DISCECTOMY     Dr. Amanda Jungling 11/2009- [facility]   COLONOSCOPY  08/03/2014   ESOPHAGOGASTRODUODENOSCOPY     Says it was normal but maybe a peptic ulcer. Early 2000's possibly [employer]   HEMORROIDECTOMY     LAPAROSCOPIC BILATERAL SALPINGO OOPHERECTOMY N/A 10/15/2021   Procedure: LAPAROSCOPIC BILATERAL SALPINGOOPHERECTOMY AND REMOVAL OF UTERINE MASS/PELVIC WASHINGS;  Surgeon: Terri Fester, MD;  Location: [facility] OR;  Service: Gynecology;  Laterality: N/A;   OVARIAN CYST SURGERY     abdominal surgery for ovarian cyst   TISSUE GRAFT  02/07/2015   pt reports gum graft for receeding gums--Dr Maria Shiner   TMJ ARTHROSCOPY     TMJ surgery    Family History  Problem Relation Age of Onset   Arthritis Other    Breast cancer Other    Coronary artery disease Maternal Grandmother    Breast cancer Maternal Grandmother        carcinoid tumor in abd   Hypertension Other    Diverticulitis Mother    Arthritis Mother    Pancreatic cancer Other      Social Connections: Moderately Isolated (05/20/2023)   Social Connection and Isolation Panel [NHANES]    Frequency of Communication with Friends and Family: Three times a week    Frequency of Social Gatherings with Friends and Family: Once a  week    Attends Religious Services: Never    Database administrator or Organizations: No    Attends Engineer, structural: Not on file    Marital Status: Married      Current Outpatient Medications:    augmented betamethasone  dipropionate (DIPROLENE -AF) 0.05 % ointment, Apply topically., Disp: , Rfl:    b complex vitamins capsule, Take 1 capsule by mouth 2 (two) times a week., Disp: , Rfl:    buPROPion  (WELLBUTRIN  XL) 300 MG 24 hr tablet, Take 1 tablet (300 mg total) by mouth daily., Disp: 90 tablet, Rfl: 1   busPIRone  (BUSPAR ) 5 MG tablet, Take 1 tablet (5 mg total) by mouth 2 (two) times daily., Disp: 180 tablet, Rfl: 0   calcium citrate-vitamin D  (CITRACAL+D) 315-200 MG-UNIT per tablet, Take 2 tablets by mouth daily., Disp: , Rfl:    CAPSAICIN EX, Apply 1 application. topically daily as needed (muscle pain)., Disp: , Rfl:  cyclobenzaprine (FLEXERIL) 5 MG tablet, Take 5 mg by mouth as needed for muscle spasms., Disp: , Rfl:    diphenhydrAMINE (BENADRYL) 25 MG tablet, Take 25 mg by mouth daily as needed for itching or allergies. , Disp: , Rfl:    doxycycline  (VIBRA -TABS) 100 MG tablet, Take 1 tablet (100 mg total) by mouth 2 (two) times daily., Disp: 14 tablet, Rfl: 0   famotidine  (PEPCID  AC) 10 MG chewable tablet, Chew 10 mg by mouth 2 (two) times daily as needed for heartburn., Disp: , Rfl:    gabapentin  (NEURONTIN ) 300 MG capsule, Take 2 capsules (600 mg total) by mouth 2 (two) times daily., Disp: 360 capsule, Rfl: 1   hyoscyamine  (LEVSIN SL) 0.125 MG SL tablet, Place 1 tablet (0.125 mg total) under the tongue every 4 (four) hours as needed., Disp: 30 tablet, Rfl: 0   ibuprofen  (ADVIL ) 200 MG tablet, Take 200 mg by mouth every 6 (six) hours as needed., Disp: , Rfl:    loratadine (CLARITIN) 10 MG tablet, Take 10 mg by mouth daily., Disp: , Rfl:    Multiple Vitamins-Minerals (ONE-A-DAY WOMENS 50 PLUS) TABS, Take 1 tablet by mouth daily., Disp: , Rfl:    primidone  (MYSOLINE ) 50 MG  tablet, Take 1 tablet (50 mg total) by mouth at bedtime., Disp: 30 tablet, Rfl: 11   Psyllium (METAMUCIL PO), Take 0.5 fluid ounces by mouth daily., Disp: , Rfl:    Ubrogepant  (UBRELVY ) 100 MG TABS, Take 1 tablet (100 mg total) by mouth as needed (Take 1 tab at the earlist onset of a migraine, May repeat in 2 hours. Max 2 tabs in 24 hours)., Disp: 16 tablet, Rfl: 11   zolpidem  (AMBIEN ) 5 MG tablet, Take 1 tablet (5 mg total) by mouth at bedtime as needed. for sleep, Disp: 30 tablet, Rfl: 0   Physical Exam:   BP (!) 130/92 (BP Location: Right Arm, Patient Position: Sitting, Cuff Size: Normal)   Pulse 71   Ht 5\' 7"  (1.702 m)   Wt 177 lb (80.3 kg)   SpO2 98%   BMI 27.72 kg/m   Salient findings:  CN II-XII intact Given history and complaints, ear microscopy was indicated and performed for evaluation with findings as below in physical exam section and in procedures;  Bilateral EAC clear and TM intact with well pneumatized middle ear spaces Weber 512: mid today Rinne 512: AC > BC b/l  Anterior rhinoscopy: Septum intact; bilateral inferior turbinates without significant hypertrophy No lesions of oral cavity/oropharynx No obviously palpable neck masses/lymphadenopathy/thyromegaly No respiratory distress or stridor No gross nystagmus; head shake neg; gait normal  Seprately Identifiable Procedures:  Prior to initiating any procedures, risks/benefits/alternatives were explained to the patient and verbal consent obtained. Procedure: Bilateral ear microscopy using microscope (CPT [phone]) Pre-procedure diagnosis: asymmetric SNHL and tinnitus Post-procedure diagnosis: same Indication: see above; given patient's otologic complaints and history, for improved and comprehensive examination of external ear and tympanic membrane, bilateral otologic examination using microscope was performed  Procedure: Patient was placed semi-recumbent. Both ear canals were examined using the microscope with findings  above. Patient tolerated the procedure well.   Impression & Plans:  Monick Gumbs is a 58 y.o. female with as  1. Asymmetrical sensorineural hearing loss   2. Sensorineural hearing loss (SNHL) of both ears   3. Tinnitus of right ear   4. Sensation of fullness in right ear    We did have a long discussion re: her findings - based on her audios, she does seem  to have fluctuating loss and tinnitus does appear to have some roaring; she denies episodic vertigo assoc with ringing/fullness, and noted hearing fluctuations but given other sx, meniere's and vestibular migraine in differential. Prior MRI without retrocochlear lesion We discussed options: including low salt diet, diuretics, observation. D/w pt R/B/A, opted obs. If she has other episodes or issues, advised to call us ; otherwise she wishes to f/u with ENT closer to home given insurance.  Will try to obtain MRI images themselves for review  See below regarding exact medications prescribed this encounter including dosages and route: No orders of the defined types were placed in this encounter.     Thank you for allowing me the opportunity to care for your patient. Please do not hesitate to contact me should you have any other questions.  Sincerely, Pamela Aloe, MD Otolaryngologist (ENT), [facility] Phone: [phone] Fax: [phone]  11/14/2023, 4:45 PM   MDM:  Level 4 - 99204 Complexity/Problems addressed: mod - chronic problems, worsening Data complexity: mod - independent review of outside audio; review of notes, ordering test - Morbidity: low currently  - Prescription Drug prescribed or managed: no

## 2023-11-09 ENCOUNTER — Encounter: Payer: PRIVATE HEALTH INSURANCE | Admitting: Family

## 2023-11-19 ENCOUNTER — Encounter: Payer: PRIVATE HEALTH INSURANCE | Admitting: Family

## 2023-11-22 ENCOUNTER — Encounter: Payer: Self-pay | Admitting: Family

## 2023-11-22 NOTE — Progress Notes (Signed)
 11/23/2023 Emmalise Nary [id number] 12-Apr-1966  Referring provider: O'Sullivan, Melissa, NP Primary GI doctor: Dr. Karene Oto  ASSESSMENT AND PLAN:   Nausea every morning for over a year, will take dramamine, worse post prandial, worse in the morning. She has associated bloating, no specific triggers, during this same time she has loose stools with urgency about 3-4 times before leaving house and another later in the day.  No weight loss, soft AB, no distention, no weight loss, no fever, chills - with recent diverticulitis and symptoms, will proceed with CT Ab and pelvis with contrast to evaluate for complications/etc - add on pepcid , alginate therapy - stool samples to rule out infection     -CRP/ESR to rule out inflammation/IBD/SCAD -Can do trial of FDGARD daily, Add on citracel/benefiber, FODMAP, trial off lactulose and lifestyle changes discussed - follow up 2 months -Possible pelvis floor component, consider pelvic floor PT or anal rectal manometry.  -Consider SIBO testing or xifaxin trial pending results -Pending labs and response to medication, can consider endoscopic evaluation Follow up 2 months, sooner if worsening symptoms, ER precautions discussed  History of hemorrhoids Status post hemorrhoidal surgery  GERD 02/2017 abdominal ultrasound unremarkable Has never had EGD, no family history of GB issues - add on pepcid  at night - consider EGD  History of adhesions disease Status post appendectomy, ruptured ovarian cyst - no distention on exam, soft AB - get CT AB and pelvis  Diverticulosis recent diverticulitis 05/03/2023 sigmoid diverticulitis no complicating features Treated with cipro /flagyl  - get labs, repeat CT AB and pelvis  History of IBS-constipation predominant CTAP 03/2012 mild dilation of small bowel loops with fecalization and distal ileum moderate colonic stool 01/07/2022 colonoscopy for change in bowel habits lower abdominal pain showed hemorrhoids,  diverticulosis nonbleeding internal hemorrhoids normal TI no specimens collected Recall 2033 05/03/2023 CT and pelvis with contrast sigmoid diverticulitis Started on FODMAP diet, FD guard and increase fiber Negative celiac 2019  Patient Care Team: Dorrene Gaucher, NP as PCP - General (Internal Medicine) Morna Arabian, NP as Nurse Practitioner (Obstetrics and Gynecology) Dominic Friendly Cordelia Dessert, MD as Consulting Physician (Gastroenterology) Merriam Abbey, DO as Consulting Physician (Neurology)  HISTORY OF PRESENT ILLNESS: 58 y.o. female with a past medical history listed below presents for evaluation of nausea/diarrhea for 1 year.   Discussed the use of AI scribe software for clinical note transcription with the patient, who gave verbal consent to proceed.  History of Present Illness   Deasia Vandenbosch is a 58 year old female with diverticulosis who presents with chronic nausea and diarrhea.  She has experienced chronic nausea and diarrhea for over a year. The nausea occurs every morning, particularly after eating breakfast, which usually consists of oatmeal. It is described as 'very queasy' but does not lead to vomiting. She often uses Tums or Pepto-Bismol for relief, and the nausea typically improves later in the afternoon, allowing her to eat more comfortably.  She experiences loose stools with urgency every morning, requiring three to four bowel movements before leaving the house. The stools are loose but not watery, and she feels a sense of incomplete evacuation. This pattern has been consistent for about a year, with occasional episodes after lunch but less frequently than in the morning. No dark, black, or bloody stools are reported, and there has been no significant weight loss over the past year.  Her past medical history includes a colonoscopy in August 2023 due to changes in bowel habits and low abdominal pain, which revealed  hemorrhoids and diverticulosis. A CT scan in November 2023  followed her first recent episode of diverticulitis, treated with Cipro  and Flagyl , resolving the symptoms. She recalls a previous episode of diverticulitis many years ago. Her mother also has a history of diverticulitis.  She has a history of multiple abdominal surgeries, including the removal of her ovaries, which resolved previous constipation issues related to torsion. She has experienced adhesive disease in the past. No recent fevers, chills, or infections are reported.  Her current medications include Wellbutrin  300 mg, gabapentin , buspirone , and primidone , which she has been taking for a long time. She also takes a multivitamin and B complex supplement. She avoids NSAIDs due to stomach issues and takes Tums and Pepto-Bismol as needed for her symptoms.  Family history includes her grandmother having a carcinoid tumor in the abdomen and intestinal issues in her great grandmother and mother. She is not aware of any family history of gallbladder issues.  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 No vomiting, heartburn, reflux, trouble swallowing, dark or bloody stools, significant weight loss, fevers, chills, infections, or recent tick exposure. She has a history of migraines, managed with Botox and Ubrelvy . No new medications have been started in the past year.        She  reports that she has never smoked. She has never used smokeless tobacco. She reports current alcohol use. She reports that she does not use drugs.  RELEVANT GI HISTORY, IMAGING AND LABS: Results   RADIOLOGY Abdominal CT: Diverticulitis (04/2023)  DIAGNOSTIC Colonoscopy: Hemorrhoids, diverticulosis (01/2022)      CBC    Component Value Date/Time   WBC 4.7 05/03/2023 1219   RBC 4.62 05/03/2023 1219   HGB 13.3 05/03/2023 1219   HCT 41.2 05/03/2023 1219   PLT 208.0 05/03/2023 1219   MCV 89.2 05/03/2023 1219   MCH 29.2 10/15/2021 1105   MCHC 32.2 05/03/2023 1219   RDW 13.3  05/03/2023 1219   LYMPHSABS 1.3 07/22/2022 1032   MONOABS 0.6 07/22/2022 1032   EOSABS 0.1 07/22/2022 1032   BASOSABS 0.0 07/22/2022 1032   Recent Labs    05/03/23 1219  HGB 13.3    CMP     Component Value Date/Time   NA 139 05/03/2023 1219   K 4.5 05/03/2023 1219   CL 103 05/03/2023 1219   CO2 29 05/03/2023 1219   GLUCOSE 88 05/03/2023 1219   BUN 11 05/03/2023 1219   CREATININE 0.90 05/03/2023 1219   CREATININE 1.00 05/01/2020 1122   CALCIUM 10.0 05/03/2023 1219   PROT 7.2 05/03/2023 1219   ALBUMIN 4.3 05/03/2023 1219   AST 21 05/03/2023 1219   ALT 21 05/03/2023 1219   ALKPHOS 76 05/03/2023 1219   BILITOT 0.3 05/03/2023 1219   GFRNONAA >60 10/12/2021 1123   GFRNONAA 65 03/20/2020 1448   GFRAA 76 03/20/2020 1448      Latest Ref Rng & Units 05/03/2023   12:19 PM 07/22/2022   10:32 AM 05/28/2022    4:15 PM  Hepatic Function  Total Protein 6.0 - 8.3 g/dL 7.2  6.5  6.9   Albumin 3.5 - 5.2 g/dL 4.3  4.0  4.2   AST 0 - 37 U/L 21  23  18    ALT 0 - 35 U/L 21  25  20    Alk Phosphatase 39 - 117 U/L 76  82  89   Total Bilirubin 0.2 - 1.2 mg/dL 0.3  0.4  0.2  Current Medications:     Current Outpatient Medications (Respiratory):    diphenhydrAMINE (BENADRYL) 25 MG tablet, Take 25 mg by mouth daily as needed for itching or allergies.    loratadine (CLARITIN) 10 MG tablet, Take 10 mg by mouth daily.  Current Outpatient Medications (Analgesics):    ibuprofen  (ADVIL ) 200 MG tablet, Take 200 mg by mouth every 6 (six) hours as needed.   Ubrogepant  (UBRELVY ) 100 MG TABS, Take 1 tablet (100 mg total) by mouth as needed (Take 1 tab at the earlist onset of a migraine, May repeat in 2 hours. Max 2 tabs in 24 hours).   Current Outpatient Medications (Other):    ondansetron  (ZOFRAN ) 4 MG tablet, Take 1 tablet (4 mg total) by mouth every 8 (eight) hours as needed for nausea or vomiting.   augmented betamethasone  dipropionate (DIPROLENE -AF) 0.05 % ointment, Apply topically.    b complex vitamins capsule, Take 1 capsule by mouth 2 (two) times a week.   buPROPion  (WELLBUTRIN  XL) 300 MG 24 hr tablet, Take 1 tablet (300 mg total) by mouth daily.   busPIRone  (BUSPAR ) 5 MG tablet, Take 1 tablet (5 mg total) by mouth 2 (two) times daily.   calcium citrate-vitamin D  (CITRACAL+D) 315-200 MG-UNIT per tablet, Take 2 tablets by mouth daily.   CAPSAICIN EX, Apply 1 application. topically daily as needed (muscle pain).   cyclobenzaprine (FLEXERIL) 5 MG tablet, Take 5 mg by mouth as needed for muscle spasms.   famotidine  (PEPCID  AC) 10 MG chewable tablet, Chew 10 mg by mouth 2 (two) times daily as needed for heartburn.   gabapentin  (NEURONTIN ) 300 MG capsule, Take 2 capsules (600 mg total) by mouth 2 (two) times daily.   Multiple Vitamins-Minerals (ONE-A-DAY WOMENS 50 PLUS) TABS, Take 1 tablet by mouth daily.   primidone  (MYSOLINE ) 50 MG tablet, Take 1 tablet (50 mg total) by mouth at bedtime.   Psyllium (METAMUCIL PO), Take 0.5 fluid ounces by mouth daily.   zolpidem  (AMBIEN ) 5 MG tablet, Take 1 tablet (5 mg total) by mouth at bedtime as needed. for sleep  Medical History:  Past Medical History:  Diagnosis Date   Allergic rhinitis 03/07/2008   Qualifier: Diagnosis of  By: Elvan Hamel MD, Hartwell Linea of this note might be different from the original. Overview:  Qualifier: Diagnosis of  By: Elvan Hamel MD, LauraLee   Chest pain 12/31/2019   Chest tightness 01/29/2020   Chronic inflammatory arthritis 03/07/2008   Qualifier: Diagnosis of  By: Elvan Hamel MD, Hartwell Linea of this note might be different from the original. Overview:  Qualifier: Diagnosis of  By: Elvan Hamel MD, LauraLee   Depression    Depression (emotion)    DIVERTICULITIS, HX OF 03/07/2008   Qualifier: Diagnosis of  By: Elvan Hamel MD, LauraLee     DJD (degenerative joint disease)    chronic neck pain   General medical examination 12/03/2010   GERD 03/07/2008   Qualifier: Diagnosis of  By: Elvan Hamel MD, Hartwell Linea of this note might be different from the original. Overview:  Qualifier: Diagnosis of  By: Elvan Hamel MD, Barnie Bora   GERD (gastroesophageal reflux disease)    Hip pain    History of diverticulitis of colon    Hx of diverticulitis of colon    Insomnia    Lateral epicondylitis of right elbow    Leukocytopenia    Migraine    Migraine without aura 03/07/2008   Qualifier: Diagnosis of  By: Elvan Hamel MD, LauraLee  Neck pain, chronic    NECK PAIN, CHRONIC 03/07/2008   Qualifier: Diagnosis of  By: Elvan Hamel MD, LauraLee     Osteoarthritis    Pain of left thumb    Pain of right sternoclavicular joint    Phantosmia    PONV (postoperative nausea and vomiting)    Routine general medical examination at a health care facility 08/09/2013   Sinusitis    Sternoclavicular joint pain, right 10/15/2019   TMJ (dislocation of temporomandibular joint)    Tremor of both hands 03/08/2020   Allergies:  Allergies  Allergen Reactions   Penicillins Anaphylaxis   Celebrex  [Celecoxib ]     Headaches   Shingrix  [Zoster Vac Recomb Adjuvanted]     Fever, myalgia, local redness     Surgical History:  She  has a past surgical history that includes Appendectomy; TMJ Arthroscopy; Ovarian cyst surgery; Cervical discectomy; Tissue graft (02/07/2015); Hemorroidectomy; Cervical ablation (05/2021); Colonoscopy (08/03/2014); Esophagogastroduodenoscopy; Laparoscopic bilateral salpingo oophorectomy (N/A, 10/15/2021); and Blepharoplasty (Bilateral, 09/24/2022). Family History:  Her family history includes Arthritis in her mother and another family member; Breast cancer in her maternal grandmother and another family member; Coronary artery disease in her maternal grandmother; Diverticulitis in her mother; Hypertension in an other family member; Macular degeneration in her mother; Pancreatic cancer in an other family member.  REVIEW OF SYSTEMS  : All other systems reviewed and negative except where noted in the History of  Present Illness.  PHYSICAL EXAM: BP 112/68   Pulse 72   Ht 5' 7 (1.702 m)   Wt 170 lb 6.4 oz (77.3 kg)   SpO2 98%   BMI 26.69 kg/m  Physical Exam   GENERAL APPEARANCE: Well nourished, in no apparent distress. HEENT: No cervical lymphadenopathy, unremarkable thyroid , sclerae anicteric, conjunctiva pink. RESPIRATORY: Respiratory effort normal, breath sounds equal bilaterally without rales, rhonchi, or wheezing. CARDIO: Regular rate and rhythm with no murmurs, rubs, or gallops, peripheral pulses intact. ABDOMEN: Soft, non-distended, active bowel sounds in all four quadrants, tenderness in right lower quadrant, no rebound, no mass appreciated. RECTAL: Declines. MUSCULOSKELETAL: Full range of motion, normal gait, without edema. SKIN: Dry, intact without rashes or lesions. No jaundice. NEURO: Alert, oriented, no focal deficits. PSYCH: Cooperative, normal mood and affect.      Edmonia Gottron, PA-C 10:53 AM

## 2023-11-23 ENCOUNTER — Other Ambulatory Visit (INDEPENDENT_AMBULATORY_CARE_PROVIDER_SITE_OTHER): Payer: PRIVATE HEALTH INSURANCE

## 2023-11-23 ENCOUNTER — Encounter: Payer: Self-pay | Admitting: Physician Assistant

## 2023-11-23 ENCOUNTER — Ambulatory Visit: Payer: PRIVATE HEALTH INSURANCE | Admitting: Physician Assistant

## 2023-11-23 ENCOUNTER — Ambulatory Visit: Payer: Self-pay | Admitting: Physician Assistant

## 2023-11-23 VITALS — BP 112/68 | HR 72 | Ht 67.0 in | Wt 170.4 lb

## 2023-11-23 DIAGNOSIS — K5900 Constipation, unspecified: Secondary | ICD-10-CM

## 2023-11-23 DIAGNOSIS — R1084 Generalized abdominal pain: Secondary | ICD-10-CM | POA: Diagnosis not present

## 2023-11-23 DIAGNOSIS — K5730 Diverticulosis of large intestine without perforation or abscess without bleeding: Secondary | ICD-10-CM

## 2023-11-23 DIAGNOSIS — R110 Nausea: Secondary | ICD-10-CM

## 2023-11-23 DIAGNOSIS — K56600 Partial intestinal obstruction, unspecified as to cause: Secondary | ICD-10-CM

## 2023-11-23 DIAGNOSIS — Z8719 Personal history of other diseases of the digestive system: Secondary | ICD-10-CM

## 2023-11-23 DIAGNOSIS — R109 Unspecified abdominal pain: Secondary | ICD-10-CM

## 2023-11-23 DIAGNOSIS — K219 Gastro-esophageal reflux disease without esophagitis: Secondary | ICD-10-CM | POA: Diagnosis not present

## 2023-11-23 DIAGNOSIS — K5732 Diverticulitis of large intestine without perforation or abscess without bleeding: Secondary | ICD-10-CM | POA: Diagnosis not present

## 2023-11-23 DIAGNOSIS — K581 Irritable bowel syndrome with constipation: Secondary | ICD-10-CM | POA: Diagnosis not present

## 2023-11-23 DIAGNOSIS — K641 Second degree hemorrhoids: Secondary | ICD-10-CM

## 2023-11-23 DIAGNOSIS — Z9049 Acquired absence of other specified parts of digestive tract: Secondary | ICD-10-CM

## 2023-11-23 LAB — CBC WITH DIFFERENTIAL/PLATELET
Basophils Absolute: 0 10*3/uL (ref 0.0–0.1)
Basophils Relative: 0.6 % (ref 0.0–3.0)
Eosinophils Absolute: 0.1 10*3/uL (ref 0.0–0.7)
Eosinophils Relative: 1.1 % (ref 0.0–5.0)
HCT: 41.8 % (ref 36.0–46.0)
Hemoglobin: 13.6 g/dL (ref 12.0–15.0)
Lymphocytes Relative: 35 % (ref 12.0–46.0)
Lymphs Abs: 1.6 10*3/uL (ref 0.7–4.0)
MCHC: 32.6 g/dL (ref 30.0–36.0)
MCV: 87.2 fl (ref 78.0–100.0)
Monocytes Absolute: 0.5 10*3/uL (ref 0.1–1.0)
Monocytes Relative: 9.8 % (ref 3.0–12.0)
Neutro Abs: 2.5 10*3/uL (ref 1.4–7.7)
Neutrophils Relative %: 53.5 % (ref 43.0–77.0)
Platelets: 207 10*3/uL (ref 150.0–400.0)
RBC: 4.79 Mil/uL (ref 3.87–5.11)
RDW: 12.9 % (ref 11.5–15.5)
WBC: 4.6 10*3/uL (ref 4.0–10.5)

## 2023-11-23 LAB — COMPREHENSIVE METABOLIC PANEL WITH GFR
ALT: 19 U/L (ref 0–35)
AST: 22 U/L (ref 0–37)
Albumin: 4.4 g/dL (ref 3.5–5.2)
Alkaline Phosphatase: 80 U/L (ref 39–117)
BUN: 11 mg/dL (ref 6–23)
CO2: 30 meq/L (ref 19–32)
Calcium: 10.2 mg/dL (ref 8.4–10.5)
Chloride: 105 meq/L (ref 96–112)
Creatinine, Ser: 0.93 mg/dL (ref 0.40–1.20)
GFR: 67.69 mL/min (ref >60.00)
Glucose, Bld: 81 mg/dL (ref 70–99)
Potassium: 4.2 meq/L (ref 3.5–5.1)
Sodium: 141 meq/L (ref 135–145)
Total Bilirubin: 0.3 mg/dL (ref 0.2–1.2)
Total Protein: 6.8 g/dL (ref 6.0–8.3)

## 2023-11-23 LAB — C-REACTIVE PROTEIN: CRP: <1 mg/dL (ref 0.5–20.0)

## 2023-11-23 LAB — SEDIMENTATION RATE: Sed Rate: 17 mm/h (ref 0–30)

## 2023-11-23 LAB — LIPASE: Lipase: 31 U/L (ref 11.0–59.0)

## 2023-11-23 LAB — TSH: TSH: 2.11 u[IU]/mL (ref 0.35–5.50)

## 2023-11-23 MED ORDER — ONDANSETRON HCL 4 MG PO TABS: 4.0000 mg | ORAL_TABLET | Freq: Three times a day (TID) | ORAL | 0 refills | Status: AC | PRN

## 2023-11-23 NOTE — Patient Instructions (Signed)
 Your provider has requested that you go to the basement level for lab work before leaving today. Press B on the elevator. The lab is located at the first door on the left as you exit the elevator.  You have been scheduled for an appointment with Santina Cull PA-C on 01-26-24 at 10:20 am . Please arrive 10 minutes early for your appointment.  Do the pepcid  at night for 4 weeks  You have been scheduled for a CT scan of the abdomen and pelvis at [facility] [place], [place] [address] [address]).   You are scheduled on 11-25-23 at 11:45pm. You should arrive 15 minutes prior to your appointment time for registration. Please follow the written instructions below on the day of your exam:  WARNING: IF YOU ARE ALLERGIC TO IODINE /X-RAY DYE, PLEASE NOTIFY RADIOLOGY IMMEDIATELY AT [phone]! YOU WILL BE GIVEN A 13 HOUR PREMEDICATION PREP.   You may take any medications as prescribed with a small amount of water, if necessary. If you take any of the following medications: METFORMIN, GLUCOPHAGE, GLUCOVANCE, AVANDAMET, RIOMET, FORTAMET, ACTOPLUS MET, JANUMET, GLUMETZA or METAGLIP, you MAY be asked to HOLD this medication 48 hours AFTER the exam.  The purpose of you drinking the oral contrast is to aid in the visualization of your intestinal tract. The contrast solution may cause some diarrhea. Depending on your individual set of symptoms, you may also receive an intravenous injection of x-ray contrast/dye. Plan on being at [facility] for 30 minutes or longer, depending on the type of exam you are having performed.  This test typically takes 30-45 minutes to complete.  If you have any questions regarding your exam or if you need to reschedule, you may call the CT department at [phone] between the hours of 8:00 am and 5:00 pm, Monday-Friday.  ________________________________________________________________________  Reflux Gourmet Rescue  It is an  ALGINATE THERAPY which is the only intervention that works to safeguard the esophagus by creating a protective barrier that actually stops reflux from happening. -The general directions for use are as stated on the packaging: Take 1 teaspoon (5 ml), or more as needed or as directed by your physician, after meals and before bed. -These general directions address the most common times for reflux to occur, but our Rescue products may be taken anytime. Some individuals may take our product preemptively, when they know they will suffer from reflux, or as needed - when discomfort arises. (If taken around food, it should be consumed last.) -You do not have to take 1 teaspoon (5 ml) of the product. While one teaspoon (5ml) may be the perfect average amount to relieve reflux suffering in some, others may require more or less. You may adjust the amount of Mint Chocolate Rescue and Vanilla Caramel Rescue to the lowest amount necessary to meet your individual needs to improve your quality of life. -You may dilute the product if it is too viscous for you to consume. Keep in mind, however, that the thickness of the product was formulated to provide optimal coating and protection of your throat and esophagus. Though diluting the product is possible, it may reduce the protective function and/or length of action. -This can be used in conjunction with reflux medications and lifestyle changes.  100% ALL-NATURAL  Paraben FREE, glycerin FREE, & potassium FREE  Made entirely from all-natural ingredients considered safe for children and during pregnancy  No known side effects  All-natural flavor Gluten FREE  Allergen FREE  Vegan  Can find more information here: NameSeizer.co.nz   First do a trial off milk/lactose products if you use them.  Add fiber like benefiber or citracel once a day Increase activity Can do trial of IBGard which is over the counter for AB pain- Take 1-2  capsules once a day for maintence or twice a day during a flare  Zofran  4 mg as needed    FODMAP stands for fermentable oligo-, di-, mono-saccharides and polyols (1). These are the scientific terms used to classify groups of carbs that are difficult for our body to digest and that are notorious for triggering digestive symptoms like bloating, gas, loose stools and stomach pain.   You can try low FODMAP diet  - start with eliminating just one column at a time that you feel may be a trigger for you. - the table at the very bottom contains foods that are low in FODMAPs   Sometimes trying to eliminate the FODMAP's from your diet is difficult or tricky, if you are stuggling with trying to do the elimination diet you can try an enzyme.  There is a food enzymes that you sprinkle in or on your food that helps break down the FODMAP. You can read more about the enzyme by going to this site: https://fodzyme.com/     FIBER SUPPLEMENT You can do metamucil or fibercon once or twice a day but if this causes gas/bloating please switch to Benefiber or Citracel.  Fiber is good for constipation/diarrhea/irritable bowel syndrome.  It can also help with weight loss and can help lower your bad cholesterol (LDL).  Please do 1 TBSP in the morning in water, coffee, or tea.  It can take up to a month before you can see a difference with your bowel movements.  It is cheapest from [employer], [employer], [employer].   Toileting tips to help with your constipation - Drink at least 64-80 ounces of water/liquid per day. - Establish a time to try to move your bowels every day.  For many people, this is after a cup of coffee or after a meal such as breakfast. - Sit all of the way back on the toilet keeping your back fairly straight and while sitting up, try to rest the tops of your forearms on your upper thighs.   - Raising your feet with a step stool/squatty potty can be helpful to improve the angle that allows your stool to  pass through the rectum. - Relax the rectum feeling it bulge toward the toilet water.  If you feel your rectum raising toward your body, you are contracting rather than relaxing. - Breathe in and slowly exhale. Belly breath by expanding your belly towards your belly button. Keep belly expanded as you gently direct pressure down and back to the anus.  A low pitched GRRR sound can assist with increasing intra-abdominal pressure.  (Can also trying to blow on a pinwheel and make it move, this helps with the same belly breathing) - Repeat 3-4 times. If unsuccessful, contract the pelvic floor to restore normal tone and get off the toilet.  Avoid excessive straining. - To reduce excessive wiping by teaching your anus to normally contract, place hands on outer aspect of knees and resist knee movement outward.  Hold 5-10 second then place hands just inside of knees and resist inward movement of knees.  Hold 5 seconds.  Repeat a few times each way.  Go to the ER if unable to pass gas, severe AB pain, unable to hold down  food, any shortness of breath of chest pain.   Small intestinal bacterial overgrowth (SIBO) occurs when there is an abnormal increase in the overall bacterial population in the small intestine -- particularly types of bacteria not commonly found in that part of the digestive tract. Small intestinal bacterial overgrowth (SIBO) commonly results when a circumstance -- such as surgery or disease -- slows the passage of food and waste products in the digestive tract, creating a breeding ground for bacteria.  Signs and symptoms of SIBO often include: Loss of appetite Abdominal pain Nausea Bloating An uncomfortable feeling of fullness after eating Diarrhea or constipation, depending on the type of gas produced  What foods trigger SIBO? While foods aren't the original cause of SIBO, certain foods do encourage the overgrowth of the wrong bacteria in your small intestine. If you're feeding them  their favorite foods, they're going to grow more, and that will trigger more of your SIBO symptoms. By the same token, you can help reduce the overgrowth by starving the problematic bacteria of their favorite foods. This strategy has led to a number of proposed SIBO eating plans. The plans vary, and so do individual results. But in general, they tend to recommend limiting carbohydrates.  These include: Sugars and sweeteners. Fruits and starchy vegetables. Dairy products. Grains.  There is a test for this we can do called a breath test, if you are positive we will treat you with an antibiotic to see if it helps.  Your symptoms are very suspicious for this condition, as discussed, we will start you on an antibiotic to see if this helps.   Here some information about pelvic floor dysfunction. This may be contributing to some of your symptoms. We will continue with our evaluation but I do want you to consider adding on fiber supplement with low-dose MiraLAX daily. We could also refer to pelvic floor physical therapy.   Pelvic Floor Dysfunction, Female Pelvic floor dysfunction (PFD) is a condition that results when the group of muscles and connective tissues that support the organs in the pelvis (pelvic floor muscles) do not work well. These muscles and their connections form a sling that supports the colon and bladder. In women, they also support the uterus. PFD causes pelvic floor muscles to be too weak, too tight, or both. In PFD, muscle movements are not coordinated. This may cause bowel or bladder problems. It may also cause pain. What are the causes? This condition may be caused by an injury to the pelvic area or by a weakening of pelvic muscles. This often results from pregnancy and childbirth or other types of strain. In many cases, the exact cause is not known. What increases the risk? The following factors may make you more likely to develop this condition: Having chronic bladder tissue  inflammation (interstitial cystitis). Being an older person. Being overweight. History of radiation treatment for cancer in the pelvic region. Previous pelvic surgery, such as removal of the uterus (hysterectomy). What are the signs or symptoms? Symptoms of this condition vary and may include: Bladder symptoms, such as: Trouble starting urination and emptying the bladder. Frequent urinary tract infections. Leaking urine when coughing, laughing, or exercising (stress incontinence). Having to pass urine urgently or frequently. Pain when passing urine. Bowel symptoms, such as: Constipation. Urgent or frequent bowel movements. Incomplete bowel movements. Painful bowel movements. Leaking stool or gas. Unexplained genital or rectal pain. Genital or rectal muscle spasms. Low back pain. Other symptoms may include: A heavy, full, or aching feeling  in the vagina. A bulge that protrudes into the vagina. Pain during or after sex. How is this diagnosed? This condition may be diagnosed based on: Your symptoms and medical history. A physical exam. During the exam, your health care provider may check your pelvic muscles for tightness, spasm, pain, or weakness. This may include a rectal exam and a pelvic exam. In some cases, you may have diagnostic tests, such as: Electrical muscle function tests. Urine flow testing. X-ray tests of bowel function. Ultrasound of the pelvic organs. How is this treated? Treatment for this condition depends on the symptoms. Treatment options include: Physical therapy. This may include Kegel exercises to help relax or strengthen the pelvic floor muscles. Biofeedback. This type of therapy provides feedback on how tight your pelvic floor muscles are so that you can learn to control them. Internal or external massage therapy. A treatment that involves electrical stimulation of the pelvic floor muscles to help control pain (transcutaneous electrical nerve stimulation,  or TENS). Sound wave therapy (ultrasound) to reduce muscle spasms. Medicines, such as: Muscle relaxants. Bladder control medicines. Surgery to reconstruct or support pelvic floor muscles may be an option if other treatments do not help. Follow these instructions at home: Activity Do your usual activities as told by your health care provider. Ask your health care provider if you should modify any activities. Do pelvic floor strengthening or relaxing exercises at home as told by your physical therapist. Lifestyle Maintain a healthy weight. Eat foods that are high in fiber, such as beans, whole grains, and fresh fruits and vegetables. Limit foods that are high in fat and processed sugars, such as fried or sweet foods. Manage stress with relaxation techniques such as yoga or meditation. General instructions If you have problems with leakage: Use absorbable pads or wear padded underwear. Wash frequently with mild soap. Keep your genital and anal area as clean and dry as possible. Ask your health care provider if you should try a barrier cream to prevent skin irritation. Take warm baths to relieve pelvic muscle tension or spasms. Take over-the-counter and prescription medicines only as told by your health care provider. Keep all follow-up visits. How is this prevented? The cause of PFD is not always known, but there are a few things you can do to reduce the risk of developing this condition, including: Staying at a healthy weight. Getting regular exercise. Managing stress. Contact a health care provider if: Your symptoms are not improving with home care. You have signs or symptoms of PFD that get worse at home. You develop new signs or symptoms. You have signs of a urinary tract infection, such as: Fever. Chills. Increased urinary frequency. A burning feeling when urinating. You have not had a bowel movement in 3 days (constipation). Summary Pelvic floor dysfunction results when the  muscles and connective tissues in your pelvic floor do not work well. These muscles and their connections form a sling that supports your colon and bladder. In women, they also support the uterus. PFD may be caused by an injury to the pelvic area or by a weakening of pelvic muscles. PFD causes pelvic floor muscles to be too weak, too tight, or a combination of both. Symptoms may vary from person to person. In most cases, PFD can be treated with physical therapies and medicines. Surgery may be an option if other treatments do not help. This information is not intended to replace advice given to you by your health care provider. Make sure you discuss any questions  you have with your health care provider. Document Revised: 10/02/2020 Document Reviewed: 10/02/2020 Elsevier Patient Education  2022 [employer].

## 2023-11-24 ENCOUNTER — Encounter: Payer: Self-pay | Admitting: Family

## 2023-11-24 ENCOUNTER — Ambulatory Visit: Payer: PRIVATE HEALTH INSURANCE | Admitting: Family

## 2023-11-24 VITALS — BP 103/79 | HR 77 | Temp 98.2°F | Resp 16 | Ht 67.0 in | Wt 170.0 lb

## 2023-11-24 DIAGNOSIS — Z23 Encounter for immunization: Secondary | ICD-10-CM | POA: Diagnosis not present

## 2023-11-24 DIAGNOSIS — G4700 Insomnia, unspecified: Secondary | ICD-10-CM

## 2023-11-24 DIAGNOSIS — R251 Tremor, unspecified: Secondary | ICD-10-CM

## 2023-11-24 DIAGNOSIS — Z0000 Encounter for general adult medical examination without abnormal findings: Secondary | ICD-10-CM

## 2023-11-24 DIAGNOSIS — F32A Depression, unspecified: Secondary | ICD-10-CM

## 2023-11-24 DIAGNOSIS — E781 Pure hyperglyceridemia: Secondary | ICD-10-CM | POA: Diagnosis not present

## 2023-11-24 DIAGNOSIS — R110 Nausea: Secondary | ICD-10-CM | POA: Diagnosis not present

## 2023-11-24 DIAGNOSIS — G43009 Migraine without aura, not intractable, without status migrainosus: Secondary | ICD-10-CM

## 2023-11-24 DIAGNOSIS — K219 Gastro-esophageal reflux disease without esophagitis: Secondary | ICD-10-CM

## 2023-11-24 LAB — LIPID PANEL
Cholesterol: 203 mg/dL — ABNORMAL HIGH (ref 0–200)
HDL: 54 mg/dL (ref >39.00)
LDL Cholesterol: 109 mg/dL — ABNORMAL HIGH (ref 0–99)
NonHDL: 149.32
Total CHOL/HDL Ratio: 4
Triglycerides: 202 mg/dL — ABNORMAL HIGH (ref 0.0–149.0)
VLDL: 40.4 mg/dL — ABNORMAL HIGH (ref 0.0–40.0)

## 2023-11-24 MED ORDER — ZOLPIDEM TARTRATE 5 MG PO TABS: 5.0000 mg | ORAL_TABLET | Freq: Every evening | ORAL | 0 refills | Status: DC | PRN

## 2023-11-24 NOTE — Assessment & Plan Note (Signed)
 Pamela Sanders

## 2023-11-24 NOTE — Assessment & Plan Note (Signed)
  Due for tetanus booster. Discussed flu shot and COVID booster. Encouraged healthy lifestyle despite barriers. - Administer tetanus booster. - Encourage flu shot and COVID booster in the fall. - Encourage balanced diet and regular exercise.

## 2023-11-24 NOTE — Patient Instructions (Signed)
 VISIT SUMMARY:  Today, you had a routine physical exam where we discussed your ongoing gastrointestinal issues, hearing loss, hand tremors, migraines, and insomnia. We also reviewed your general health maintenance needs.  YOUR PLAN:  GASTROINTESTINAL ISSUES: You have ongoing gastrointestinal symptoms including nausea, lack of appetite, and frequent loose stools. We are considering several potential causes and pending further diagnostics. -Order H. pylori test. -Review CT scan and stool sample results. -Consider endoscopy if CT scan is normal.  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 We discussed the possibility of Meniere's disease and the potential benefit of a hearing aid. -Consider hearing aid evaluation.  ESSENTIAL TREMOR: You have mild hand tremors that are well-managed with Primidone . -Continue current treatment with Primidone .  MIGRAINE: You experience occasional migraines that are effectively managed with Ubrelvy . -Continue using Ubrelvy  as needed for migraines.  INSOMNIA: You have difficulty sleeping, which you manage with the Calm app and occasional use of Ambien . -Refill Ambien  prescription.  GENERAL HEALTH MAINTENANCE: You are due for a tetanus booster and we discussed the importance of flu and COVID boosters. We also talked about maintaining a healthy lifestyle. -Administer tetanus booster. -Encourage getting a flu shot and COVID booster in the fall. -Encourage a balanced diet and regular exercise.

## 2023-11-24 NOTE — Assessment & Plan Note (Signed)
 Rare migraines resolve with ubrelvy .

## 2023-11-24 NOTE — Assessment & Plan Note (Signed)
Stable on pepcid

## 2023-11-24 NOTE — Assessment & Plan Note (Addendum)
 Updated controlled substance contract. Uses ambien  prn.

## 2023-11-24 NOTE — Assessment & Plan Note (Signed)
 Improved with primidone  per neurology.

## 2023-11-24 NOTE — Assessment & Plan Note (Signed)
 Mood is stable overall on wellbutrin  and buspar . Continue same.

## 2023-11-24 NOTE — Progress Notes (Signed)
 Subjective:     Patient ID: Pamela Sanders, female    DOB: 1966/03/27, 58 y.o.   MRN: [id number]  Chief Complaint  Patient presents with   Annual Exam    HPI  Discussed the use of AI scribe software for clinical note transcription with the patient, who gave verbal consent to proceed.  History of Present Illness  Pamela Sanders is a 58 year old female who presents for a routine physical exam.  She experiences ongoing gastrointestinal issues, including daily morning nausea and a lack of appetite. Nausea occurs upon waking or after eating but does not lead to vomiting. She eats less and finds fresh foods like salads upsetting to her stomach. She experiences frequent loose stools with urgency every morning, going three to four times, which restricts her ability to leave the house. She consumes lactose-free milk and has not identified dietary triggers. She is under evaluation by a GI specialist.  She manages her mood with Wellbutrin  and Buspar , maintaining stability despite external stressors. She takes gabapentin  for migraine prevention and neuronal pain related to neck issues. She experiences low energy and reduced physical activity due to gastrointestinal symptoms. She walks frequently at work but has not engaged in more intensive exercise. She has joined a gym at work but has not incorporated it into her routine.  She has mild hand tremors improved with Primidone  and occasional migraines relieved by Ubrelvy . 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 She is considering a hearing aid.  Immunizations:  due for tetanus Diet: fair, limited by recenet GI issues Exercise: some, not enough. Energy is low due to GI issues Colonoscopy:  2023 Pap Smear: scheduled Mammogram: scheduled Vision: up to date Dental: up to date     Lab Results  Component Value Date   CHOL 215 (H) 02/04/2022   HDL 61.40 02/04/2022    LDLCALC 99 09/25/2019   LDLDIRECT 133.0 02/04/2022   TRIG 240.0 (H) 02/04/2022   CHOLHDL 4 02/04/2022    Health Maintenance Due  Topic Date Due   Cervical Cancer Screening (HPV/Pap Cotest)  06/28/2021   COVID-19 Vaccine (8 - 2024-25 season) 04/03/2023   MAMMOGRAM  11/23/2023    Past Medical History:  Diagnosis Date   Allergic rhinitis 03/07/2008   Qualifier: Diagnosis of  By: Elvan Hamel MD, Hartwell Linea of this note might be different from the original. Overview:  Qualifier: Diagnosis of  By: Elvan Hamel MD, LauraLee   Chest pain 12/31/2019   Chest tightness 01/29/2020   Chronic inflammatory arthritis 03/07/2008   Qualifier: Diagnosis of  By: Elvan Hamel MD, Hartwell Linea of this note might be different from the original. Overview:  Qualifier: Diagnosis of  By: Elvan Hamel MD, LauraLee   Depression    Depression (emotion)    DIVERTICULITIS, HX OF 03/07/2008   Qualifier: Diagnosis of  By: Elvan Hamel MD, LauraLee     DJD (degenerative joint disease)    chronic neck pain   General medical examination 12/03/2010   GERD 03/07/2008   Qualifier: Diagnosis of  By: Elvan Hamel MD, Hartwell Linea of this note might be different from the original. Overview:  Qualifier: Diagnosis of  By: Elvan Hamel MD, Barnie Bora   GERD (gastroesophageal reflux disease)    Hip pain    History of diverticulitis of colon    Hx of diverticulitis of colon    Insomnia    Lateral epicondylitis of right  elbow    Leukocytopenia    Migraine    Migraine without aura 03/07/2008   Qualifier: Diagnosis of  By: Elvan Hamel MD, LauraLee     Neck pain, chronic    NECK PAIN, CHRONIC 03/07/2008   Qualifier: Diagnosis of  By: Elvan Hamel MD, LauraLee     Osteoarthritis    Pain of left thumb    Pain of right sternoclavicular joint    Phantosmia    PONV (postoperative nausea and vomiting)    Routine general medical examination at a health care facility 08/09/2013   Sinusitis    Sternoclavicular joint pain, right 10/15/2019   TMJ  (dislocation of temporomandibular joint)    Tremor of both hands 03/08/2020    Past Surgical History:  Procedure Laterality Date   APPENDECTOMY     BLEPHAROPLASTY Bilateral 09/24/2022   CERVICAL ABLATION  05/2021   CERVICAL DISCECTOMY     Dr. Amanda Jungling 11/2009- [facility]   COLONOSCOPY  08/03/2014   ESOPHAGOGASTRODUODENOSCOPY     Says it was normal but maybe a peptic ulcer. Early 2000's possibly [facility]   HEMORROIDECTOMY     LAPAROSCOPIC BILATERAL SALPINGO OOPHERECTOMY N/A 10/15/2021   Procedure: LAPAROSCOPIC BILATERAL SALPINGOOPHERECTOMY AND REMOVAL OF UTERINE MASS/PELVIC WASHINGS;  Surgeon: Terri Fester, MD;  Location: [facility] OR;  Service: Gynecology;  Laterality: N/A;   OVARIAN CYST SURGERY     abdominal surgery for ovarian cyst   TISSUE GRAFT  02/07/2015   pt reports gum graft for receeding gums--Dr Maria Shiner   TMJ ARTHROSCOPY     TMJ surgery    Family History  Problem Relation Age of Onset   Diverticulitis Mother    Arthritis Mother    Macular degeneration Mother    Coronary artery disease Maternal Grandmother    Breast cancer Maternal Grandmother        carcinoid tumor in abd   Arthritis Other    Breast cancer Other    Hypertension Other    Pancreatic cancer Other     Social History   Socioeconomic History   Marital status: Married    Spouse name: Rice Chamorro   Number of children: 0   Years of education: Not on file   Highest education level: Doctorate  Occupational History   Occupation: Nurse, mental health: [employer]  Tobacco Use   Smoking status: Never   Smokeless tobacco: Never  Vaping Use   Vaping status: Never Used  Substance and Sexual Activity   Alcohol use: Yes    Alcohol/week: 0.0 standard drinks of alcohol    Comment: 2 drinks weekly, wine   Drug use: No   Sexual activity: Not on file  Other Topics Concern   Not on file  Social History Narrative   Domestic Partner   no children   She grew up in [place], [place]    Family moved to  [place] when she was in [employer]   Occupation:lab research / Personal assistant     Regular Exercise:  3 x weekly   Caffeine Use:  2 cups coffee daily, 1 tea      Patient is right-handed. She lives with her wife in a 2 story home. She drinks 1-2 cups of coffee a day and 1-2 glasses of tea. She exercises regularly.   Social Drivers of Corporate investment banker Strain: Low Risk  (11/17/2023)   Overall Financial Resource Strain (CARDIA)    Difficulty of Paying Living Expenses: Not hard at all  Food Insecurity: No  Food Insecurity (11/17/2023)   Hunger Vital Sign    Worried About Running Out of Food in the Last Year: Never true    Ran Out of Food in the Last Year: Never true  Transportation Needs: No Transportation Needs (11/17/2023)   PRAPARE - Administrator, Civil Service (Medical): No    Lack of Transportation (Non-Medical): No  Physical Activity: Insufficiently Active (11/17/2023)   Exercise Vital Sign    Days of Exercise per Week: 2 days    Minutes of Exercise per Session: 30 min  Stress: Stress Concern Present (11/17/2023)   [employer] of Occupational Health - Occupational Stress Questionnaire    Feeling of Stress : Rather much  Social Connections: Moderately Isolated (11/17/2023)   Social Connection and Isolation Panel    Frequency of Communication with Friends and Family: More than three times a week    Frequency of Social Gatherings with Friends and Family: Once a week    Attends Religious Services: Never    Database administrator or Organizations: No    Attends Engineer, structural: Not on file    Marital Status: Married  Catering manager Violence: Not on file    Outpatient Medications Prior to Visit  Medication Sig Dispense Refill   augmented betamethasone  dipropionate (DIPROLENE -AF) 0.05 % ointment Apply topically.     b complex vitamins capsule Take 1 capsule by mouth 2 (two) times a week.     buPROPion  (WELLBUTRIN  XL) 300 MG 24 hr tablet Take 1 tablet  (300 mg total) by mouth daily. 90 tablet 1   busPIRone  (BUSPAR ) 5 MG tablet Take 1 tablet (5 mg total) by mouth 2 (two) times daily. 180 tablet 0   calcium citrate-vitamin D  (CITRACAL+D) 315-200 MG-UNIT per tablet Take 2 tablets by mouth daily.     CAPSAICIN EX Apply 1 application. topically daily as needed (muscle pain).     cyclobenzaprine (FLEXERIL) 5 MG tablet Take 5 mg by mouth as needed for muscle spasms.     diphenhydrAMINE (BENADRYL) 25 MG tablet Take 25 mg by mouth daily as needed for itching or allergies.      famotidine  (PEPCID  AC) 10 MG chewable tablet Chew 10 mg by mouth 2 (two) times daily as needed for heartburn.     gabapentin  (NEURONTIN ) 300 MG capsule Take 2 capsules (600 mg total) by mouth 2 (two) times daily. 360 capsule 1   ibuprofen  (ADVIL ) 200 MG tablet Take 200 mg by mouth every 6 (six) hours as needed.     loratadine (CLARITIN) 10 MG tablet Take 10 mg by mouth daily.     Multiple Vitamins-Minerals (ONE-A-DAY WOMENS 50 PLUS) TABS Take 1 tablet by mouth daily.     ondansetron  (ZOFRAN ) 4 MG tablet Take 1 tablet (4 mg total) by mouth every 8 (eight) hours as needed for nausea or vomiting. 20 tablet 0   primidone  (MYSOLINE ) 50 MG tablet Take 1 tablet (50 mg total) by mouth at bedtime. 30 tablet 11   Psyllium (METAMUCIL PO) Take 0.5 fluid ounces by mouth daily.     Ubrogepant  (UBRELVY ) 100 MG TABS Take 1 tablet (100 mg total) by mouth as needed (Take 1 tab at the earlist onset of a migraine, May repeat in 2 hours. Max 2 tabs in 24 hours). 16 tablet 11   zolpidem  (AMBIEN ) 5 MG tablet Take 1 tablet (5 mg total) by mouth at bedtime as needed. for sleep 30 tablet 0   No facility-administered medications prior to  visit.    Allergies  Allergen Reactions   Penicillins Anaphylaxis   Celebrex  [Celecoxib ]     Headaches   Shingrix  [Zoster Vac Recomb Adjuvanted]     Fever, myalgia, local redness    ROS    See HPI Objective:    Physical Exam   BP 103/79 (BP Location: Right  Arm, Patient Position: Sitting, Cuff Size: Normal)   Pulse 77   Temp 98.2 F (36.8 C) (Oral)   Resp 16   Ht 5' 7 (1.702 m)   Wt 170 lb (77.1 kg)   SpO2 99%   BMI 26.63 kg/m  Wt Readings from Last 3 Encounters:  11/24/23 170 lb (77.1 kg)  11/23/23 170 lb 6.4 oz (77.3 kg)  11/04/23 177 lb (80.3 kg)  Physical Exam  Constitutional: She is oriented to person, place, and time. She appears well-developed and well-nourished. No distress.  HENT:  Head: Normocephalic and atraumatic.  Right Ear: Tympanic membrane and ear canal normal.  Left Ear: Tympanic membrane and ear canal normal.  Mouth/Throat: Oropharynx is clear and moist.  Eyes: Pupils are equal, round, and reactive to light. No scleral icterus.  Neck: Normal range of motion. No thyromegaly present.  Cardiovascular: Normal rate and regular rhythm.   No murmur heard. Pulmonary/Chest: Effort normal and breath sounds normal. No respiratory distress. He has no wheezes. She has no rales. She exhibits no tenderness.  Abdominal: Soft. Bowel sounds are normal. She exhibits no distension and no mass. There is no tenderness. There is no rebound and no guarding.  Musculoskeletal: She exhibits no edema.  Lymphadenopathy:    She has no cervical adenopathy.  Neurological: She is alert and oriented to person, place, and time. She has normal patellar reflexes. She exhibits normal muscle tone. Coordination normal.  Skin: Skin is warm and dry.  Psychiatric: She has a normal mood and affect. Her behavior is normal. Judgment and thought content normal.  Breast/Pelvic: deferred       Assessment & Plan:        Assessment & Plan:   Problem List Items Addressed This Visit       Unprioritized   Tremor of both hands   Improved with primidone  per neurology.       Preventative health care - Primary    Due for tetanus booster. Discussed flu shot and COVID booster. Encouraged healthy lifestyle despite barriers. - Administer tetanus booster. -  Encourage flu shot and COVID booster in the fall. - Encourage balanced diet and regular exercise.      Nausea   Chronic symptoms with normal previous imaging and labs. Differential includes stress-related issues, IBS, H. pylori, SIBO, or pelvic floor dysfunction. Pending further diagnostics (stool and CT per GI).  Discussed H. pylori as treatable. - Order H. pylori test.      Relevant Orders   H. pylori breath test   Migraine without aura   Rare migraines resolve with ubrelvy .      Insomnia   Uses ambien  prn.       Relevant Medications   zolpidem  (AMBIEN ) 5 MG tablet   Other Relevant Orders   DRUG MONITORING, PANEL 8 WITH CONFIRMATION, URINE   GERD (gastroesophageal reflux disease)   Stable on pepcid .        Depression   Mood is stable overall on wellbutrin  and buspar . Continue same.      Other Visit Diagnoses       Hypertriglyceridemia       Relevant Orders   Lipid  panel     Need for Td vaccine       Relevant Orders   Td : Tetanus/diphtheria >7yo Preservative  free       I am having Pamela Sanders maintain her calcium citrate-vitamin D , loratadine, famotidine , b complex vitamins, diphenhydrAMINE, Psyllium (METAMUCIL PO), CAPSAICIN EX, One-A-Day Womens 50 Plus, cyclobenzaprine, ibuprofen , primidone , Ubrelvy , busPIRone , gabapentin , buPROPion , augmented betamethasone  dipropionate, ondansetron , and zolpidem .  Meds ordered this encounter  Medications   zolpidem  (AMBIEN ) 5 MG tablet    Sig: Take 1 tablet (5 mg total) by mouth at bedtime as needed. for sleep    Dispense:  30 tablet    Refill:  0    Supervising Provider:   Randie Bustle A [[id number]]

## 2023-11-24 NOTE — Assessment & Plan Note (Signed)
 Chronic symptoms with normal previous imaging and labs. Differential includes stress-related issues, IBS, H. pylori, SIBO, or pelvic floor dysfunction. Pending further diagnostics (stool and CT per GI).  Discussed H. pylori as treatable. - Order H. pylori test.

## 2023-11-25 ENCOUNTER — Ambulatory Visit (HOSPITAL_BASED_OUTPATIENT_CLINIC_OR_DEPARTMENT_OTHER): Payer: PRIVATE HEALTH INSURANCE

## 2023-11-25 ENCOUNTER — Ambulatory Visit: Payer: Self-pay | Admitting: Family

## 2023-11-25 LAB — DRUG MONITORING, PANEL 8 WITH CONFIRMATION, URINE
6 Acetylmorphine: NEGATIVE ng/mL (ref <10)
Alcohol Metabolites: NEGATIVE ng/mL (ref <500)
Amphetamines: NEGATIVE ng/mL (ref <500)
Benzodiazepines: NEGATIVE ng/mL (ref <100)
Buprenorphine, Urine: NEGATIVE ng/mL (ref <5)
Cocaine Metabolite: NEGATIVE ng/mL (ref <150)
Creatinine: 13.4 mg/dL — ABNORMAL LOW (ref >=20.0)
MDMA: NEGATIVE ng/mL (ref <500)
Marijuana Metabolite: NEGATIVE ng/mL (ref <20)
Opiates: NEGATIVE ng/mL (ref <100)
Oxidant: NEGATIVE ug/mL (ref <200)
Oxycodone: NEGATIVE ng/mL (ref <100)
Specific Gravity: 1.003 (ref >=1.003)
pH: 7.5 (ref 4.5–9.0)

## 2023-11-25 LAB — DM TEMPLATE

## 2023-11-26 ENCOUNTER — Other Ambulatory Visit: Payer: PRIVATE HEALTH INSURANCE

## 2023-11-26 DIAGNOSIS — R110 Nausea: Secondary | ICD-10-CM

## 2023-11-26 DIAGNOSIS — K56600 Partial intestinal obstruction, unspecified as to cause: Secondary | ICD-10-CM

## 2023-11-26 DIAGNOSIS — R1084 Generalized abdominal pain: Secondary | ICD-10-CM

## 2023-11-27 DIAGNOSIS — R1084 Generalized abdominal pain: Secondary | ICD-10-CM

## 2023-11-27 DIAGNOSIS — R110 Nausea: Secondary | ICD-10-CM

## 2023-11-27 DIAGNOSIS — K56600 Partial intestinal obstruction, unspecified as to cause: Secondary | ICD-10-CM

## 2023-11-27 LAB — UNLABELED: Test Ordered On Req: 39441

## 2023-11-29 ENCOUNTER — Ambulatory Visit (HOSPITAL_BASED_OUTPATIENT_CLINIC_OR_DEPARTMENT_OTHER)
Admission: RE | Admit: 2023-11-29 | Discharge: 2023-11-29 | Disposition: A | Discharge status: Home / Self Care | Payer: PRIVATE HEALTH INSURANCE | Source: Ambulatory Visit | Attending: Physician Assistant | Admitting: Physician Assistant

## 2023-11-29 DIAGNOSIS — R1084 Generalized abdominal pain: Secondary | ICD-10-CM | POA: Diagnosis present

## 2023-11-29 DIAGNOSIS — K56600 Partial intestinal obstruction, unspecified as to cause: Secondary | ICD-10-CM | POA: Insufficient documentation

## 2023-11-29 LAB — C. DIFFICILE GDH AND TOXIN A/B
GDH ANTIGEN: NOT DETECTED
MICRO NUMBER:: 16606305
SPECIMEN QUALITY:: ADEQUATE
TOXIN A AND B: NOT DETECTED

## 2023-11-29 MED ORDER — IOHEXOL 300 MG/ML  SOLN
100.0000 mL | Freq: Once | INTRAMUSCULAR | Status: AC | PRN
  Administered 2023-11-29: 100 mL via INTRAVENOUS

## 2023-11-30 ENCOUNTER — Telehealth: Payer: Self-pay

## 2023-11-30 LAB — STOOL CULTURE
E coli, Shiga toxin Assay: NEGATIVE
Stool Culture result 1 (CMPCXR): NEGATIVE

## 2023-11-30 NOTE — Addendum Note (Signed)
 Addended by: Calahan Pak N on: 11/30/2023 09:17 AM   Modules accepted: Orders

## 2023-11-30 NOTE — Telephone Encounter (Signed)
 Patient notified via MyChart message, see patient message dated 6/21

## 2024-01-04 ENCOUNTER — Other Ambulatory Visit: Payer: Self-pay | Admitting: Family

## 2024-01-05 ENCOUNTER — Encounter: Payer: Self-pay | Admitting: Neurology

## 2024-01-06 ENCOUNTER — Telehealth: Payer: Self-pay | Admitting: Family

## 2024-01-06 NOTE — Telephone Encounter (Signed)
 Please request Pap and Mammo results from [facility].

## 2024-01-11 NOTE — Telephone Encounter (Signed)
 Electronic request made

## 2024-01-25 NOTE — Progress Notes (Unsigned)
 01/26/2024 Pamela Sanders [id number] August 05, 1965  Referring provider: O'Sullivan, Melissa, NP Primary GI doctor: Dr. San  ASSESSMENT AND PLAN:   Loose stools 4-5 times in the morning, no bloating, hematochezia, no weight loss over 6 months, likely IBS-D Negative celiac 2019 05/03/2023 CTAP W proximal sigmoid colon compatible with acute diverticulitis 11/29/2023 CTAP W colonic diverticulosis no evidence of diverticulitis otherwise unremarkable 11/23/2023 CBC unremarkable negative C. difficile normal inflammatory markers negative stool culture normal thyroid  -Can do trial of FDGARD daily, FODMAP, trial off lactulose and lifestyle changes discussed -Possible pelvis floor component, consider pelvic floor PT or anal rectal manometry.  -xifaxin trial for IBS-D Follow up 2 -3 months, if not better consider colonoscopy to rule out microscopic colitis  History of IBS-diarrhea Negative celiac 2019 01/07/2022 colonoscopy for change in bowel habits lower abdominal pain showed hemorrhoids, diverticulosis nonbleeding internal hemorrhoids normal TI no specimens collected Recall 2033 05/03/2023 CT and pelvis with contrast sigmoid diverticulitis 12/01/23 CT unremarkable Started on FODMAP diet, FD guard and increase fiber Add on benefiber Xifaxin Consider colonoscopy  History of hemorrhoids Status post hemorrhoidal surgery  GERD 02/2017 abdominal ultrasound unremarkable Has never had EGD, no family history of GB issues 11/29/2023 CTAP W unremarkable gallbladder, liver - improved, continue the same - consider EGD  History of adhesions disease Status post appendectomy, ruptured ovarian cyst - no distention on exam, soft AB - get CT AB and pelvis  Diverticulosis recent diverticulitis Colonoscopy 01/07/2022 unremarkable, no specimens collected recall 2033 05/03/2023 sigmoid diverticulitis no complicating features Treated with cipro /flagyl    Patient Care Team: Daryl Setter, NP  as PCP - General (Internal Medicine) Stuart Norris, NP as Nurse Practitioner (Obstetrics and Gynecology) Legrand Victory LITTIE MOULD, MD as Consulting Physician (Gastroenterology) Skeet Juliene SAUNDERS, DO as Consulting Physician (Neurology)  HISTORY OF PRESENT ILLNESS: 58 y.o. female with a past medical history listed below presents for evaluation of nausea/diarrhea for 1 year.   Patient last seen in the office 11/23/2023 for 1 year of nausea, loose stools, bloating.  Had diverticulitis November 2024 treated with Cipro  Flagyl .  Previous normal colonoscopy 01/2022.   Repeat CT 12/01/2023 unremarkable other than diverticulosis without diverticulitis.  Normal inflammatory markers, CBC without anemia or infection.  Normal kidney liver.  Negative stool studies. Patient started on FDgard, fiber, FODMAP diet, trial off of lactulose.  Discussed potential SIBO and pelvic floor.  Also discussed potential EGD, started on Pepcid  and alginate therapy.  Discussed the use of AI scribe software for clinical note transcription with the patient, who gave verbal consent to proceed.  History of Present Illness   Pamela Sanders is a 58 year old female with diverticulitis who presents with gastrointestinal symptoms including nausea, bloating, and loose stools.  She has been experiencing nausea, bloating, and loose stools for the past two months, following an episode of diverticulitis in November 2024. She reports that a recent CT scan and lab work were performed, and she was told there was no evidence of diverticulitis or inflammation. She recalls being told that her stool studies and blood work did not show any concerning findings.  She uses FD Guard Fiber, Pepcid  at night, and alginate therapy, which have helped improve her nausea, particularly in the morning. Occasionally, she uses less drowsy Dramamine to manage nausea, which she finds effective. No trouble swallowing, dark stools, vertigo, or migraines, though she experiences  occasional dizziness and tinnitus.  She experiences urgency and multiple loose stools in the morning, typically four to five times between 7  AM and 11 AM, but is symptom-free by lunchtime. This occurs at least three days a week and is not correlated with dietary intake. She has tried fiber supplements, including a citrus-flavored one, but discontinued due to taste and lack of improvement. No weight loss, significant bloating, or gas.  She has a history of adhesion disease and previously experienced constipation, which is no longer an issue. She underwent a colonoscopy in 2023, and her Wellbutrin  dose was increased to 300 mg in 2023. She recently started buspirone . She reports incomplete bowel movements but no blood in stools.  There was an issue with sample collection for Giardia and cryptosporidium testing, which remains unresolved. She has not started any new medications in the past year.      She  reports that she has never smoked. She has never used smokeless tobacco. She reports current alcohol use. She reports that she does not use drugs.  RELEVANT GI HISTORY, IMAGING AND LABS: Results   LABS Stool studies: Negative  RADIOLOGY Abdominal CT: No diverticulitis, no inflammation (11/2023)      CBC    Component Value Date/Time   WBC 4.6 11/23/2023 1107   RBC 4.79 11/23/2023 1107   HGB 13.6 11/23/2023 1107   HCT 41.8 11/23/2023 1107   PLT 207.0 11/23/2023 1107   MCV 87.2 11/23/2023 1107   MCH 29.2 10/15/2021 1105   MCHC 32.6 11/23/2023 1107   RDW 12.9 11/23/2023 1107   LYMPHSABS 1.6 11/23/2023 1107   MONOABS 0.5 11/23/2023 1107   EOSABS 0.1 11/23/2023 1107   BASOSABS 0.0 11/23/2023 1107   Recent Labs    05/03/23 1219 11/23/23 1107  HGB 13.3 13.6    CMP     Component Value Date/Time   NA 141 11/23/2023 1107   K 4.2 11/23/2023 1107   CL 105 11/23/2023 1107   CO2 30 11/23/2023 1107   GLUCOSE 81 11/23/2023 1107   BUN 11 11/23/2023 1107   CREATININE 0.93 11/23/2023  1107   CREATININE 1.00 05/01/2020 1122   CALCIUM 10.2 11/23/2023 1107   PROT 6.8 11/23/2023 1107   ALBUMIN 4.4 11/23/2023 1107   AST 22 11/23/2023 1107   ALT 19 11/23/2023 1107   ALKPHOS 80 11/23/2023 1107   BILITOT 0.3 11/23/2023 1107   GFRNONAA >60 10/12/2021 1123   GFRNONAA 65 03/20/2020 1448   GFRAA 76 03/20/2020 1448      Latest Ref Rng & Units 11/23/2023   11:07 AM 05/03/2023   12:19 PM 07/22/2022   10:32 AM  Hepatic Function  Total Protein 6.0 - 8.3 g/dL 6.8  7.2  6.5   Albumin 3.5 - 5.2 g/dL 4.4  4.3  4.0   AST 0 - 37 U/L 22  21  23    ALT 0 - 35 U/L 19  21  25    Alk Phosphatase 39 - 117 U/L 80  76  82   Total Bilirubin 0.2 - 1.2 mg/dL 0.3  0.3  0.4       Current Medications:     Current Outpatient Medications (Respiratory):    diphenhydrAMINE (BENADRYL) 25 MG tablet, Take 25 mg by mouth daily as needed for itching or allergies.    loratadine (CLARITIN) 10 MG tablet, Take 10 mg by mouth daily.  Current Outpatient Medications (Analgesics):    ibuprofen  (ADVIL ) 200 MG tablet, Take 200 mg by mouth every 6 (six) hours as needed.   Ubrogepant  (UBRELVY ) 100 MG TABS, Take 1 tablet (100 mg total) by mouth as needed (Take 1  tab at the earlist onset of a migraine, May repeat in 2 hours. Max 2 tabs in 24 hours).   Current Outpatient Medications (Other):    augmented betamethasone  dipropionate (DIPROLENE -AF) 0.05 % ointment, Apply topically.   b complex vitamins capsule, Take 1 capsule by mouth 2 (two) times a week.   buPROPion  (WELLBUTRIN  XL) 300 MG 24 hr tablet, Take 1 tablet (300 mg total) by mouth daily.   busPIRone  (BUSPAR ) 5 MG tablet, Take 1 tablet (5 mg total) by mouth 2 (two) times daily.   calcium citrate-vitamin D  (CITRACAL+D) 315-200 MG-UNIT per tablet, Take 2 tablets by mouth daily.   CAPSAICIN EX, Apply 1 application. topically daily as needed (muscle pain).   cyclobenzaprine (FLEXERIL) 5 MG tablet, Take 5 mg by mouth as needed for muscle spasms.   famotidine   (PEPCID  AC) 10 MG chewable tablet, Chew 10 mg by mouth 2 (two) times daily as needed for heartburn.   gabapentin  (NEURONTIN ) 300 MG capsule, Take 2 capsules (600 mg total) by mouth 2 (two) times daily.   Multiple Vitamins-Minerals (ONE-A-DAY WOMENS 50 PLUS) TABS, Take 1 tablet by mouth daily.   ondansetron  (ZOFRAN ) 4 MG tablet, Take 1 tablet (4 mg total) by mouth every 8 (eight) hours as needed for nausea or vomiting.   primidone  (MYSOLINE ) 50 MG tablet, Take 1 tablet (50 mg total) by mouth at bedtime.   Psyllium (METAMUCIL PO), Take 0.5 fluid ounces by mouth daily.   rifaximin  (XIFAXAN ) 550 MG TABS tablet, Take 1 tablet (550 mg total) by mouth 3 (three) times daily for 14 days.   zolpidem  (AMBIEN ) 5 MG tablet, Take 1 tablet (5 mg total) by mouth at bedtime as needed. for sleep  Medical History:  Past Medical History:  Diagnosis Date   Allergic rhinitis 03/07/2008   Qualifier: Diagnosis of  By: Tanda MD, Jay Deal of this note might be different from the original. Overview:  Qualifier: Diagnosis of  By: Tanda MD, LauraLee   Chest pain 12/31/2019   Chest tightness 01/29/2020   Chronic inflammatory arthritis 03/07/2008   Qualifier: Diagnosis of  By: Tanda MD, Jay Deal of this note might be different from the original. Overview:  Qualifier: Diagnosis of  By: Tanda MD, LauraLee   Depression    Depression (emotion)    DIVERTICULITIS, HX OF 03/07/2008   Qualifier: Diagnosis of  By: Tanda MD, LauraLee     DJD (degenerative joint disease)    chronic neck pain   General medical examination 12/03/2010   GERD 03/07/2008   Qualifier: Diagnosis of  By: Tanda MD, Jay Deal of this note might be different from the original. Overview:  Qualifier: Diagnosis of  By: Tanda MD, Jay   GERD (gastroesophageal reflux disease)    Hip pain    History of diverticulitis of colon    Hx of diverticulitis of colon    Insomnia    Lateral epicondylitis of right  elbow    Leukocytopenia    Migraine    Migraine without aura 03/07/2008   Qualifier: Diagnosis of  By: Tanda MD, LauraLee     Neck pain, chronic    NECK PAIN, CHRONIC 03/07/2008   Qualifier: Diagnosis of  By: Tanda MD, LauraLee     Osteoarthritis    Pain of left thumb    Pain of right sternoclavicular joint    Phantosmia    PONV (postoperative nausea and vomiting)    Routine general medical examination at  a health care facility 08/09/2013   Sinusitis    Sternoclavicular joint pain, right 10/15/2019   TMJ (dislocation of temporomandibular joint)    Tremor of both hands 03/08/2020   Allergies:  Allergies  Allergen Reactions   Penicillins Anaphylaxis   Celebrex  [Celecoxib ]     Headaches   Shingrix  [Zoster Vac Recomb Adjuvanted]     Fever, myalgia, local redness     Surgical History:  She  has a past surgical history that includes Appendectomy; TMJ Arthroscopy; Ovarian cyst surgery; Cervical discectomy; Tissue graft (02/07/2015); Hemorroidectomy; Cervical ablation (05/2021); Colonoscopy (08/03/2014); Esophagogastroduodenoscopy; Laparoscopic bilateral salpingo oophorectomy (N/A, 10/15/2021); and Blepharoplasty (Bilateral, 09/24/2022). Family History:  Her family history includes Arthritis in her mother and another family member; Breast cancer in her maternal grandmother and another family member; Coronary artery disease in her maternal grandmother; Diverticulitis in her mother; Hypertension in an other family member; Macular degeneration in her mother; Pancreatic cancer in an other family member.  REVIEW OF SYSTEMS  : All other systems reviewed and negative except where noted in the History of Present Illness.  PHYSICAL EXAM: BP 122/68   Pulse 67   Ht 5' 7 (1.702 m)   Wt 171 lb (77.6 kg)   BMI 26.78 kg/m  Physical Exam   GENERAL APPEARANCE: Well nourished, in no apparent distress HEENT: No cervical lymphadenopathy, unremarkable thyroid , sclerae anicteric, conjunctiva  pink RESPIRATORY: Respiratory effort normal, BS equal bilateral without rales, rhonchi, wheezing CARDIO: RRR with no MRGs, peripheral pulses intact ABDOMEN: Soft, non distended, active bowel sounds in all 4 quadrants, no tenderness to palpation, no rebound, no mass appreciated RECTAL: declines MUSCULOSKELETAL: Full ROM, normal gait, without edema SKIN: Dry, intact without rashes or lesions. No jaundice. NEURO: Alert, oriented, no focal deficits PSYCH: Cooperative, normal mood and affect.      Alan JONELLE Coombs, PA-C 11:03 AM

## 2024-01-26 ENCOUNTER — Ambulatory Visit (INDEPENDENT_AMBULATORY_CARE_PROVIDER_SITE_OTHER): Payer: PRIVATE HEALTH INSURANCE | Admitting: Physician Assistant

## 2024-01-26 ENCOUNTER — Encounter: Payer: Self-pay | Admitting: Physician Assistant

## 2024-01-26 ENCOUNTER — Encounter: Payer: Self-pay | Admitting: Family

## 2024-01-26 VITALS — BP 122/68 | HR 67 | Ht 67.0 in | Wt 171.0 lb

## 2024-01-26 DIAGNOSIS — Z8719 Personal history of other diseases of the digestive system: Secondary | ICD-10-CM

## 2024-01-26 DIAGNOSIS — R197 Diarrhea, unspecified: Secondary | ICD-10-CM | POA: Diagnosis not present

## 2024-01-26 DIAGNOSIS — K580 Irritable bowel syndrome with diarrhea: Secondary | ICD-10-CM

## 2024-01-26 DIAGNOSIS — K219 Gastro-esophageal reflux disease without esophagitis: Secondary | ICD-10-CM

## 2024-01-26 MED ORDER — RIFAXIMIN 550 MG PO TABS
550.0000 mg | ORAL_TABLET | Freq: Three times a day (TID) | ORAL | 0 refills | Status: AC
Start: 2024-01-26 — End: 2024-02-09

## 2024-01-26 NOTE — Patient Instructions (Addendum)
 FIBER SUPPLEMENT switch to Benefiber  Fiber is good for constipation/diarrhea/irritable bowel syndrome.  It can also help with weight loss and can help lower your bad cholesterol (LDL).  Please do 1 TBSP in the morning in water, coffee, or tea.  It can take up to a month before you can see a difference with your bowel movements.  It is cheapest from [employer], [employer], [employer].    [facility]  [address], [place], [place] [address], Take your printed orders to [facility] to pick up your kit and they will result it   Here some information about pelvic floor dysfunction. This may be contributing to some of your symptoms. We will continue with our evaluation but I do want you to consider adding on fiber supplement with low-dose MiraLAX daily. We could also refer to pelvic floor physical therapy.   Pelvic Floor Dysfunction, Female Pelvic floor dysfunction (PFD) is a condition that results when the group of muscles and connective tissues that support the organs in the pelvis (pelvic floor muscles) do not work well. These muscles and their connections form a sling that supports the colon and bladder. In women, they also support the uterus. PFD causes pelvic floor muscles to be too weak, too tight, or both. In PFD, muscle movements are not coordinated. This may cause bowel or bladder problems. It may also cause pain. What are the causes? This condition may be caused by an injury to the pelvic area or by a weakening of pelvic muscles. This often results from pregnancy and childbirth or other types of strain. In many cases, the exact cause is not known. What increases the risk? The following factors may make you more likely to develop this condition: Having chronic bladder tissue inflammation (interstitial cystitis). Being an older person. Being overweight. History of radiation treatment for cancer in the pelvic region. Previous pelvic surgery, such as removal of the uterus (hysterectomy). What  are the signs or symptoms? Symptoms of this condition vary and may include: Bladder symptoms, such as: Trouble starting urination and emptying the bladder. Frequent urinary tract infections. Leaking urine when coughing, laughing, or exercising (stress incontinence). Having to pass urine urgently or frequently. Pain when passing urine. Bowel symptoms, such as: Constipation. Urgent or frequent bowel movements. Incomplete bowel movements. Painful bowel movements. Leaking stool or gas. Unexplained genital or rectal pain. Genital or rectal muscle spasms. Low back pain. Other symptoms may include: A heavy, full, or aching feeling in the vagina. A bulge that protrudes into the vagina. Pain during or after sex. How is this diagnosed? This condition may be diagnosed based on: Your symptoms and medical history. A physical exam. During the exam, your health care provider may check your pelvic muscles for tightness, spasm, pain, or weakness. This may include a rectal exam and a pelvic exam. In some cases, you may have diagnostic tests, such as: Electrical muscle function tests. Urine flow testing. X-ray tests of bowel function. Ultrasound of the pelvic organs. How is this treated? Treatment for this condition depends on the symptoms. Treatment options include: Physical therapy. This may include Kegel exercises to help relax or strengthen the pelvic floor muscles. Biofeedback. This type of therapy provides feedback on how tight your pelvic floor muscles are so that you can learn to control them. Internal or external massage therapy. A treatment that involves electrical stimulation of the pelvic floor muscles to help control pain (transcutaneous electrical nerve stimulation, or TENS). Sound wave therapy (ultrasound) to reduce muscle spasms. Medicines, such  as: Muscle relaxants. Bladder control medicines. Surgery to reconstruct or support pelvic floor muscles may be an option if other  treatments do not help. Follow these instructions at home: Activity Do your usual activities as told by your health care provider. Ask your health care provider if you should modify any activities. Do pelvic floor strengthening or relaxing exercises at home as told by your physical therapist. Lifestyle Maintain a healthy weight. Eat foods that are high in fiber, such as beans, whole grains, and fresh fruits and vegetables. Limit foods that are high in fat and processed sugars, such as fried or sweet foods. Manage stress with relaxation techniques such as yoga or meditation. General instructions If you have problems with leakage: Use absorbable pads or wear padded underwear. Wash frequently with mild soap. Keep your genital and anal area as clean and dry as possible. Ask your health care provider if you should try a barrier cream to prevent skin irritation. Take warm baths to relieve pelvic muscle tension or spasms. Take over-the-counter and prescription medicines only as told by your health care provider. Keep all follow-up visits. How is this prevented? The cause of PFD is not always known, but there are a few things you can do to reduce the risk of developing this condition, including: Staying at a healthy weight. Getting regular exercise. Managing stress. Contact a health care provider if: Your symptoms are not improving with home care. You have signs or symptoms of PFD that get worse at home. You develop new signs or symptoms. You have signs of a urinary tract infection, such as: Fever. Chills. Increased urinary frequency. A burning feeling when urinating. You have not had a bowel movement in 3 days (constipation). Summary Pelvic floor dysfunction results when the muscles and connective tissues in your pelvic floor do not work well. These muscles and their connections form a sling that supports your colon and bladder. In women, they also support the uterus. PFD may be caused  by an injury to the pelvic area or by a weakening of pelvic muscles. PFD causes pelvic floor muscles to be too weak, too tight, or a combination of both. Symptoms may vary from person to person. In most cases, PFD can be treated with physical therapies and medicines. Surgery may be an option if other treatments do not help. This information is not intended to replace advice given to you by your health care provider. Make sure you discuss any questions you have with your health care provider. Document Revised: 10/02/2020 Document Reviewed: 10/02/2020 Elsevier Patient Education  2022 Elsevier Inc.  What Is Microscopic Colitis? Microscopic colitis is a condition that causes chronic, watery diarrhea. Unlike other types of colitis (like ulcerative colitis or Crohn's disease), the colon (large intestine) appears normal during a colonoscopy. The inflammation can only be seen under a microscope--hence the name.  There are two main types:  Lymphocytic colitis - increased white blood cells (lymphocytes) in the colon lining Collagenous colitis - thickened layer of collagen (a protein) in the colon lining  Common Symptoms  Ongoing watery diarrhea (often multiple times a day) Urgency to have a bowel movement Abdominal pain or cramping Bloating or gas Fatigue Weight loss (less common)  Causes and Risk Factors The exact cause isn't fully known, but possible contributing factors include:  Immune system reactions  Medications, such as: NSAIDs (e.g., ibuprofen ) Proton pump inhibitors (e.g., omeprazole ) SSRIs (e.g., sertraline) Smoking Older age (most common in people over 52) Female sex (more common in women)  How Is It Diagnosed?  Colonoscopy: usually appears normal  Biopsy (tiny tissue sample from the colon) is needed to see inflammation under a microscope  Treatment Options Treatment depends on how severe your symptoms are:  Lifestyle & Diet Changes  Avoid trigger foods (e.g.,  caffeine, dairy, fatty foods) Quit smoking Reduce alcohol Stay hydrated  Medications  Anti-diarrheal meds (like loperamide/Imodium) Budesonide (a corticosteroid with fewer side effects, often the first choice for moderate-to-severe cases) Bismuth subsalicylate (e.g., Pepto-Bismol) Stopping or switching medications that may be triggering the condition   What's the Outlook?  Many people improve with treatment. The condition may come and go. It's not associated with colon cancer. Regular follow-up helps keep symptoms under control.  Due to recent changes in healthcare laws, you may see the results of your imaging and laboratory studies on MyChart before your provider has had a chance to review them.  We understand that in some cases there may be results that are confusing or concerning to you. Not all laboratory results come back in the same time frame and the provider may be waiting for multiple results in order to interpret others.  Please give us  48 hours in order for your provider to thoroughly review all the results before contacting the office for clarification of your results.

## 2024-01-27 ENCOUNTER — Other Ambulatory Visit (HOSPITAL_COMMUNITY): Payer: Self-pay

## 2024-01-27 ENCOUNTER — Telehealth: Payer: Self-pay

## 2024-01-27 NOTE — Telephone Encounter (Signed)
 Pharmacy Patient Advocate Encounter   Received notification from CoverMyMeds that prior authorization for Xifaxan  550MG  tablets is required/requested.   Insurance verification completed.   The patient is insured through [employer] .   Per test claim: PA required; PA submitted to above mentioned insurance via Latent Key/confirmation #/EOC [facility] Status is pending

## 2024-01-28 NOTE — Telephone Encounter (Addendum)
 Pharmacy Patient Advocate Encounter  Received notification from [facility] that Prior Authorization for Xifaxan  550MG  tablets has been DENIED.  Full denial letter will be uploaded to the media tab. See denial reason below.  Per your health plan's criteria, this drug is covered if you meet the following:  (1) You have a condition of bowel movement problems with diarrhea (irritatble bowel syndrome with diarrhea) (2)You have tried or cannot use both a tricyclic antidepressant (for example: amitriptyline, notriptyline) and Viberzi (3) You are 59 years of age or older  PA #/Case ID/Reference #: [facility]

## 2024-02-01 NOTE — Telephone Encounter (Signed)
 MyChart message sent to patient.

## 2024-02-02 NOTE — Telephone Encounter (Signed)
 Per denial, patient also needs to have tried (or have contraindication) Viberzi.

## 2024-02-06 ENCOUNTER — Other Ambulatory Visit: Payer: Self-pay | Admitting: Family

## 2024-02-06 DIAGNOSIS — G4700 Insomnia, unspecified: Secondary | ICD-10-CM

## 2024-02-08 ENCOUNTER — Telehealth: Payer: Self-pay

## 2024-02-08 ENCOUNTER — Other Ambulatory Visit (HOSPITAL_COMMUNITY): Payer: Self-pay

## 2024-02-08 MED ORDER — VIBERZI 75 MG PO TABS: 75.0000 mg | ORAL_TABLET | Freq: Two times a day (BID) | ORAL | 3 refills | Status: DC

## 2024-02-08 NOTE — Telephone Encounter (Signed)
 Requesting: Ambien  5mg   Contract: 02/10/22 UDS: 11/24/23 Last Visit: 11/24/23 Next Visit: None Last Refill: 11/24/23 #30 and 0RF  Please Advise

## 2024-02-08 NOTE — Telephone Encounter (Signed)
 Pharmacy Patient Advocate Encounter   Received notification from Patient Advice Request messages that prior authorization for Viberzi  75MG  tablets is required/requested.   Insurance verification completed.   The patient is insured through [employer] .   Per test claim: PA required; PA submitted to above mentioned insurance via Latent Key/confirmation #/[id number] Status is pending

## 2024-02-09 NOTE — Telephone Encounter (Signed)
 Pharmacy Patient Advocate Encounter  Received notification from [facility] that Prior Authorization for Viberzi  75MG  tablets has been APPROVED from 02-08-2024 to 02-07-2025   PA #/Case ID/Reference #: [id number]

## 2024-02-24 ENCOUNTER — Encounter: Payer: Self-pay | Admitting: Family

## 2024-02-25 ENCOUNTER — Encounter (INDEPENDENT_AMBULATORY_CARE_PROVIDER_SITE_OTHER): Payer: PRIVATE HEALTH INSURANCE | Admitting: Family

## 2024-02-25 ENCOUNTER — Telehealth: Payer: Self-pay

## 2024-02-25 DIAGNOSIS — U071 COVID-19: Secondary | ICD-10-CM

## 2024-02-25 MED ORDER — NIRMATRELVIR&RITONAVIR 300/100 20 X 150 MG & 10 X 100MG PO TBPK: 3.0000 | ORAL_TABLET | Freq: Two times a day (BID) | ORAL | 0 refills | Status: DC

## 2024-02-25 NOTE — Telephone Encounter (Signed)
 Copied from CRM [id number]. Topic: Clinical - Prescription Issue >> Feb 25, 2024  1:16 PM Chiquita SQUIBB wrote: Reason for CRM: Olam from the pharmacy is calling in regarding the nirmatrelvir /ritonavir  (PAXLOVID ) 20 x 150 MG & 10 x 100MG  TBPK [[id number]]. She wanted to verify the doctor talked about stopping her other medications including the VIBERZI  75 MG TABS, primidone  (MYSOLINE ) 50 MG tablet, Ubrogepant  (UBRELVY ) 100 MG TABS, possibly the sleeping medication as well. Please verify with the pharmacy at [phone].

## 2024-02-25 NOTE — Telephone Encounter (Signed)
 Spoke to patient and verified with her that she should not take ambien , ubrelvy , viberzi  or mysoline  while taking paxlovid .  Notified Lisa at the pharmacy.

## 2024-02-25 NOTE — Telephone Encounter (Signed)

## 2024-02-25 NOTE — Telephone Encounter (Signed)
 Copied from CRM [id number]. Topic: Clinical - Medical Advice >> Feb 25, 2024 11:34 AM Harlene ORN wrote: Reason for CRM: Pt was exposed to Covid via Spouse. Tested and doesnt have any symptoms, but is still having mild sinus headaches. Says she is building up something in her chest.  Requesting an antiviral prescription if approved by the PCP. Please call the patient if any more questions: [phone]

## 2024-02-27 MED ORDER — ALBUTEROL SULFATE HFA 108 (90 BASE) MCG/ACT IN AERS
2.0000 | INHALATION_SPRAY | Freq: Four times a day (QID) | RESPIRATORY_TRACT | 0 refills | Status: DC | PRN
Start: 2024-02-27 — End: 2024-04-21

## 2024-02-27 NOTE — Addendum Note (Signed)
 Addended by: DARYL SETTER on: 02/27/2024 04:03 PM   Modules accepted: Orders

## 2024-03-07 ENCOUNTER — Ambulatory Visit: Payer: PRIVATE HEALTH INSURANCE | Admitting: Neurology

## 2024-03-08 ENCOUNTER — Other Ambulatory Visit (HOSPITAL_BASED_OUTPATIENT_CLINIC_OR_DEPARTMENT_OTHER): Payer: Self-pay

## 2024-03-08 ENCOUNTER — Ambulatory Visit: Payer: PRIVATE HEALTH INSURANCE | Admitting: Family Medicine

## 2024-03-08 ENCOUNTER — Encounter: Payer: Self-pay | Admitting: Family Medicine

## 2024-03-08 VITALS — BP 97/67 | HR 88 | Temp 98.3°F | Ht 67.0 in | Wt 168.0 lb

## 2024-03-08 DIAGNOSIS — U071 COVID-19: Secondary | ICD-10-CM | POA: Diagnosis not present

## 2024-03-08 MED ORDER — DOXYCYCLINE HYCLATE 100 MG PO TABS
100.0000 mg | ORAL_TABLET | Freq: Two times a day (BID) | ORAL | 0 refills | Status: DC
  Filled 2024-03-08: qty 14, 7d supply, fill #0

## 2024-03-08 MED ORDER — HYDROCOD POLI-CHLORPHE POLI ER 10-8 MG/5ML PO SUER
5.0000 mL | Freq: Two times a day (BID) | ORAL | 0 refills | Status: AC | PRN
  Filled 2024-03-08: qty 50, 5d supply, fill #0

## 2024-03-08 MED ORDER — PREDNISONE 20 MG PO TABS
40.0000 mg | ORAL_TABLET | Freq: Every day | ORAL | 0 refills | Status: AC
  Filled 2024-03-08: qty 10, 5d supply, fill #0

## 2024-03-08 MED ORDER — BENZONATATE 200 MG PO CAPS
200.0000 mg | ORAL_CAPSULE | Freq: Two times a day (BID) | ORAL | 0 refills | Status: DC | PRN
  Filled 2024-03-08: qty 20, 10d supply, fill #0

## 2024-03-08 NOTE — Progress Notes (Signed)
 Acute Office Visit  Subjective:     Patient ID: Pamela Sanders, female    DOB: 12/03/1965, 57 y.o.   MRN: [id number]  Chief Complaint  Patient presents with   Covid Positive    HPI Patient is in today for COVID infection.   Discussed the use of AI scribe software for clinical note transcription with the patient, who gave verbal consent to proceed.  History of Present Illness Pamela Sanders is a 58 year old female who presents with worsening symptoms following COVID-19 infection and Paxlovid  treatment.  She initially tested positive for COVID-19 after completing a course of Paxlovid  on March 01, 2024. Her symptoms began to worsen around September 26-27, 2025, with a severe sore throat, headache, nausea, and significant head congestion. She also experienced difficulty hearing, chest congestion, and a productive cough with yellow sputum. She describes feeling very tired and having body aches accompanied by a low-grade fever, which has since improved. Her current symptoms include significant congestion and a persistent cough.  She has been managing her symptoms with over-the-counter medications such as Sudafed, Mucinex, Tylenol , and Benadryl. She also used Zofran  for dizziness and has an albuterol  inhaler, which was recently refilled. She has been using Mucinex twice daily and maintaining hydration with fluids.  This is her first COVID-19 infection despite being vigilant and receiving all available vaccines. She recently traveled to [facility], [place] , and suspects she contracted the virus during or after the trip.  She works in Medical laboratory scientific officer and expresses concern about her ability to manage her responsibilities due to her illness. She typically does not require a work note but acknowledges she may need one this time due to the severity of her symptoms.           ROS All review of systems negative except what is listed in the HPI      Objective:    BP 97/67   Pulse 88    Temp 98.3 F (36.8 C) (Oral)   Ht 5' 7 (1.702 m)   Wt 168 lb (76.2 kg)   SpO2 98%   BMI 26.31 kg/m    Physical Exam Vitals reviewed.  Constitutional:      Appearance: Normal appearance.  Cardiovascular:     Rate and Rhythm: Normal rate and regular rhythm.     Heart sounds: Normal heart sounds.  Pulmonary:     Effort: Pulmonary effort is normal.     Breath sounds: Wheezing present. No rhonchi or rales.  Skin:    General: Skin is warm and dry.  Neurological:     Mental Status: She is alert and oriented to person, place, and time.  Psychiatric:        Mood and Affect: Mood normal.        Behavior: Behavior normal.        Thought Content: Thought content normal.        Judgment: Judgment normal.         No results found for any visits on 03/08/24.      Assessment & Plan:   Problem List Items Addressed This Visit   None Visit Diagnoses       COVID-19    -  Primary   Relevant Medications   benzonatate  (TESSALON ) 200 MG capsule   chlorpheniramine-HYDROcodone  (TUSSIONEX) 10-8 MG/5ML   doxycycline  (VIBRA -TABS) 100 MG tablet   predniSONE  (DELTASONE ) 20 MG tablet       Assessment & Plan COVID-19 infection with persistent respiratory symptoms  Consider secondary  bacterial infection if symptoms persist beyond ten days. - Prescribed Tessalon  Perles for cough. - Prescribed cough syrup for symptomatic relief and sleep aid. - Advised continuation of Mucinex twice daily. - Encouraged hydration with electrolyte fluids. - Instructed to monitor symptoms and start doxycycline  if symptoms worsen or persist beyond ten days. - Prescribed prednisone  for potential use if respiratory symptoms worsen. - Provided work note for absence until Monday.       Meds ordered this encounter  Medications   benzonatate  (TESSALON ) 200 MG capsule    Sig: Take 1 capsule (200 mg total) by mouth 2 (two) times daily as needed for cough.    Dispense:  20 capsule    Refill:  0     Supervising Provider:   DOMENICA BLACKBIRD A [[id number]]   chlorpheniramine-HYDROcodone  (TUSSIONEX) 10-8 MG/5ML    Sig: Take 5 mLs by mouth every 12 (twelve) hours as needed for up to 5 days.    Dispense:  50 mL    Refill:  0    Supervising Provider:   DOMENICA BLACKBIRD A [[id number]]   doxycycline  (VIBRA -TABS) 100 MG tablet    Sig: Take 1 tablet (100 mg total) by mouth 2 (two) times daily for 7 days.    Dispense:  14 tablet    Refill:  0    Supervising Provider:   DOMENICA BLACKBIRD A [[id number]]   predniSONE  (DELTASONE ) 20 MG tablet    Sig: Take 2 tablets (40 mg total) by mouth daily with breakfast for 5 days.    Dispense:  10 tablet    Refill:  0    Supervising Provider:   DOMENICA BLACKBIRD A [[id number]]    Return if symptoms worsen or fail to improve.  Waddell KATHEE Mon, NP

## 2024-03-09 NOTE — Telephone Encounter (Signed)
 Appt has been rescheduled to 04/21/24 at 10:20 am

## 2024-03-10 ENCOUNTER — Encounter: Payer: Self-pay | Admitting: Family Medicine

## 2024-03-11 ENCOUNTER — Encounter: Payer: Self-pay | Admitting: Family Medicine

## 2024-03-14 NOTE — Progress Notes (Unsigned)
 NEUROLOGY FOLLOW UP OFFICE NOTE  Pamela Sanders [id number]  Assessment/Plan:    Tension-type headache, not intractable Essential tremor Migraine with and without aura, without status migrainosus, intractable Chronic neck pain s/p ACDF and radioablation    Tremor management:  primidone  50mg  at bedtime Migraine prevention:  Deferred.  Would consider zonidamide if needed. Migraine rescue:  Ubrelvy  100mg  Lifestyle modification: Limit use of pain relievers to no more than 9 days out of the month to prevent risk of rebound or medication-overuse headache. Diet modification/hydration/caffeine cessation Routine exercise Sleep hygiene Consider vitamins/supplements:  magnesium  citrate 400mg  daily, riboflavin 400mg  daily, CoQ10 100mg  three times daily Keep headache diary Follow up ***    Subjective:  Pamela Sanders is a 58 year old right-handed female with degenerative joint disease with chronic neck pain s/p ACDF C5-C6 (2011) follows up for essential tremor.     UPDATE: Tremor: Currently taking primidone  50mg  at bedtime Tremors are controlled.  She is able to work in the lab.    Migraines: Occurs every 3 months at most.  Responded to Ubrelvy .  Aborts quickly.   Tension-type headaches: Has chronic neck and shoulder pain.  History of radioablation.  Gets massages and will take a Flexeril from time to time.  They are mild.  They occur 2 times a week.  Treats with Tylenol  (sometimes ibuprofen  but causes GI upset).  Lasts 2 hours.        Current NSAIDS:  Ibuprofen  (for tension-type headache) Current analgesics:  Tylenol  (for tension-type headache) Current triptans:  none Current ergotamine:  no Current anti-emetic:  no Current muscle relaxants:  no Current sleep aide:  Ambien  Current Antihypertensive medications:  no Current Antidepressant medications:  Wellbutrin  XL 150mg  daily Current Anticonvulsant medications:  Gabapentin  600mg  twice daily, primidone  50mg  at bedtime (ET) Current  anti-CGRP:  Ubrelvy  100mg  Current Vitamins/Herbal/Supplements:  no Current Antihistamines/Decongestants: Benadryl, Claritin Other therapy:  no Other medications:  Ambien    HISTORY: Essential tremor: She started noticing tremor in 2021.  It is bilateral, but worse on the right.  It occurs when hands are with use or outstretched.  It does not occur at rest.  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  Her hands will shake.  No trouble swallowing.  No trouble with walking.  No history of REM sleep behavior disorder.  She does occasionally drink wine but has not noticed if symptoms subside when drinking.  She reports that her great grandmother had Parkinson's disease but no known family history of essential tremor.   Works in a lab papet tremors fluctuates right worse than left - over a year -    [place] grandmother had PD.  No family history of tremors    Migraines/Tension-type headache/chronic neck pain: Onset:  1999 Location:  Left or right frontal/retro-orbital, sometimes occipital, chronic left sided neck pain.  She has constant pain in left jaw (history of jaw surgery for TMJ.  wears a retainer and invisalign  Quality:  Stabbing/throbbing Intensity:  Moderate to severe.  Aura:  Sometimes bluish vision.  One time had tunnel vision in 2005. Prodrome:  no Postdrome:  no Associated symptoms:  Photophobia, phonophobia.  Sometimes tinnitus.  She denies associated nausea, vomiting, visual disturbance, autonomic symptoms or unilateral numbness or weakness. Duration:  1/2 to 1 day Frequency:  Once every 5 to 6 months Frequency of abortive medication: 2 days a week Triggers/exacerbating  factors:  Emotional stress, skipped meals, sleep deprivation Relieving factors:  Laying down in dark and quiet room Activity:  Avoids   She also has tension-type headaches in the back of her head radiating  into the neck as well as headaches at the bridge of her nose.  They last a day.  They occur 2 days a week.     Imaging: MRI of brain with and without contrast (01/10/13):  1.  No acute intracranial abnormality.  2.  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   XR Cervical Spine (04/21/17): 1.  C5-C6 ACDF with anterior plate and screw fixation and complete graft incorporation.  No motion with flexion/extension to suggest pseudoarthrosis.  2.  Mild C4-C5 and C6-C7 degenerative disc disease.  Mild bilateral C7-T1 facet arthropathy.  3.  No acute fracture or prevertebral swelling.  4.  Multiple mandibular screws bilaterally.   Past medications: Past NSAIDS:  etodolac Past analgesics:  no Past abortive triptans:  Sumatriptan  (causes nausea) Past abortive ergotamine:  no Past muscle relaxants:  Tizanidine  4mg  (helped) Past anti-emetic:  Compazine (used for sumatriptan -induced nausea) Past antihypertensive medications:  no Past antidepressant medications:  Nortriptyline (did not tolerate), Lexapro, Prozac - didn't tolerate SSRIs.   Past anticonvulsant medications:  Topiramate (disoriented), Keppra Past anti-CGRP:  no Past vitamins/Herbal/Supplements:  no Past antihistamines/decongestants:  no Other past therapies:  Botox (effective but caused welts and itching), massage, caffeine  PAST MEDICAL HISTORY: Past Medical History:  Diagnosis Date   Allergic rhinitis 03/07/2008   Qualifier: Diagnosis of  By: Tanda MD, Jay Deal of this note might be different from the original. Overview:  Qualifier: Diagnosis of  By: Tanda MD, LauraLee   Chest pain 12/31/2019   Chest tightness 01/29/2020   Chronic inflammatory arthritis 03/07/2008   Qualifier: Diagnosis of  By: Tanda MD, Jay Deal of this note might be different from the original. Overview:  Qualifier: Diagnosis of  By: Tanda MD, LauraLee   Depression    Depression (emotion)     DIVERTICULITIS, HX OF 03/07/2008   Qualifier: Diagnosis of  By: Tanda MD, LauraLee     DJD (degenerative joint disease)    chronic neck pain   General medical examination 12/03/2010   GERD 03/07/2008   Qualifier: Diagnosis of  By: Tanda MD, Jay Deal of this note might be different from the original. Overview:  Qualifier: Diagnosis of  By: Tanda MD, Jay   GERD (gastroesophageal reflux disease)    Hip pain    History of diverticulitis of colon    Hx of diverticulitis of colon    Insomnia    Lateral epicondylitis of right elbow    Leukocytopenia    Migraine    Migraine without aura 03/07/2008   Qualifier: Diagnosis of  By: Tanda MD, LauraLee     Neck pain, chronic    NECK PAIN, CHRONIC 03/07/2008   Qualifier: Diagnosis of  By: Tanda MD, LauraLee     Osteoarthritis    Pain of left thumb    Pain of right sternoclavicular joint    Phantosmia    PONV (postoperative nausea and vomiting)    Routine general medical examination at a health care facility 08/09/2013   Sinusitis    Sternoclavicular joint pain, right 10/15/2019   TMJ (dislocation of temporomandibular joint)    Tremor of both hands 03/08/2020    MEDICATIONS: Current Outpatient Medications on File Prior to Visit  Medication Sig Dispense  Refill   albuterol  (VENTOLIN  HFA) 108 (90 Base) MCG/ACT inhaler Inhale 2 puffs into the lungs every 6 (six) hours as needed for wheezing or shortness of breath. 8 g 0   augmented betamethasone  dipropionate (DIPROLENE -AF) 0.05 % ointment Apply topically.     b complex vitamins capsule Take 1 capsule by mouth 2 (two) times a week.     benzonatate  (TESSALON ) 200 MG capsule Take 1 capsule (200 mg total) by mouth 2 (two) times daily as needed for cough. 20 capsule 0   buPROPion  (WELLBUTRIN  XL) 300 MG 24 hr tablet Take 1 tablet (300 mg total) by mouth daily. 90 tablet 1   busPIRone  (BUSPAR ) 5 MG tablet Take 1 tablet (5 mg total) by mouth 2 (two) times daily. 180 tablet 0    calcium citrate-vitamin D  (CITRACAL+D) 315-200 MG-UNIT per tablet Take 2 tablets by mouth daily.     CAPSAICIN EX Apply 1 application. topically daily as needed (muscle pain).     cyclobenzaprine (FLEXERIL) 5 MG tablet Take 5 mg by mouth as needed for muscle spasms.     diphenhydrAMINE (BENADRYL) 25 MG tablet Take 25 mg by mouth daily as needed for itching or allergies.      doxycycline  (VIBRA -TABS) 100 MG tablet Take 1 tablet (100 mg total) by mouth 2 (two) times daily for 7 days. 14 tablet 0   famotidine  (PEPCID  AC) 10 MG chewable tablet Chew 10 mg by mouth 2 (two) times daily as needed for heartburn.     gabapentin  (NEURONTIN ) 300 MG capsule Take 2 capsules (600 mg total) by mouth 2 (two) times daily. 360 capsule 1   ibuprofen  (ADVIL ) 200 MG tablet Take 200 mg by mouth every 6 (six) hours as needed.     loratadine (CLARITIN) 10 MG tablet Take 10 mg by mouth daily.     Multiple Vitamins-Minerals (ONE-A-DAY WOMENS 50 PLUS) TABS Take 1 tablet by mouth daily.     ondansetron  (ZOFRAN ) 4 MG tablet Take 1 tablet (4 mg total) by mouth every 8 (eight) hours as needed for nausea or vomiting. 20 tablet 0   primidone  (MYSOLINE ) 50 MG tablet Take 1 tablet (50 mg total) by mouth at bedtime. 30 tablet 11   Psyllium (METAMUCIL PO) Take 0.5 fluid ounces by mouth daily.     Ubrogepant  (UBRELVY ) 100 MG TABS Take 1 tablet (100 mg total) by mouth as needed (Take 1 tab at the earlist onset of a migraine, May repeat in 2 hours. Max 2 tabs in 24 hours). 16 tablet 11   VIBERZI  75 MG TABS Take 1 tablet (75 mg total) by mouth 2 (two) times daily before a meal. 60 tablet 3   zolpidem  (AMBIEN ) 5 MG tablet Take 1 tablet (5 mg total) by mouth at bedtime as needed for sleep 30 tablet 0   No current facility-administered medications on file prior to visit.    ALLERGIES: Allergies  Allergen Reactions   Penicillins Anaphylaxis   Celebrex  [Celecoxib ]     Headaches   Shingrix  [Zoster Vac Recomb Adjuvanted]     Fever,  myalgia, local redness    FAMILY HISTORY: Family History  Problem Relation Age of Onset   Diverticulitis Mother    Arthritis Mother    Macular degeneration Mother    Coronary artery disease Maternal Grandmother    Breast cancer Maternal Grandmother        carcinoid tumor in abd   Arthritis Other    Breast cancer Other    Hypertension Other  Pancreatic cancer Other       Objective:  *** General: No acute distress.  Patient appears well-groomed.   Head:  Normocephalic/atraumatic Neck:  Supple.  No paraspinal tenderness.  Full range of motion. Heart:  Regular rate and rhythm. Neuro:  Alert and oriented.  Speech fluent and not dysarthric.  Language intact.  CN II-XII intact.  Bulk and tone normal.  Muscle strength 5/5 throughout.  Sensation to light touch intact.  Deep tendon reflexes 2+ throughout, toes downgoing.  Gait normal.  Romberg negative.    Juliene Dunnings, DO  CC: Eleanor Ponto, NP

## 2024-03-15 ENCOUNTER — Encounter: Payer: Self-pay | Admitting: Neurology

## 2024-03-15 ENCOUNTER — Telehealth: Payer: PRIVATE HEALTH INSURANCE | Admitting: Neurology

## 2024-03-15 ENCOUNTER — Ambulatory Visit: Payer: PRIVATE HEALTH INSURANCE | Admitting: Physician Assistant

## 2024-03-15 DIAGNOSIS — G250 Essential tremor: Secondary | ICD-10-CM

## 2024-03-15 DIAGNOSIS — G43001 Migraine without aura, not intractable, with status migrainosus: Secondary | ICD-10-CM

## 2024-03-15 DIAGNOSIS — G44229 Chronic tension-type headache, not intractable: Secondary | ICD-10-CM

## 2024-03-15 DIAGNOSIS — M542 Cervicalgia: Secondary | ICD-10-CM

## 2024-03-15 MED ORDER — UBRELVY 100 MG PO TABS: 100.0000 mg | ORAL_TABLET | ORAL | 11 refills | Status: AC | PRN

## 2024-03-15 MED ORDER — PRIMIDONE 50 MG PO TABS: 50.0000 mg | ORAL_TABLET | Freq: Every day | ORAL | 11 refills | Status: AC

## 2024-03-31 ENCOUNTER — Ambulatory Visit: Payer: PRIVATE HEALTH INSURANCE | Admitting: Gastroenterology

## 2024-03-31 LAB — HM MAMMOGRAPHY

## 2024-04-20 NOTE — Progress Notes (Unsigned)
 04/21/2024 Pamela Sanders [id number] 03-Jan-1966  Referring provider: Daryl Setter, NP Primary GI doctor: Dr. San  ASSESSMENT AND PLAN:   History of IBS-diarrhea, loose stools in the morning, gas, nausea in the morning Denies hematochezia, weight loss Negative celiac 2019 01/07/2022 colonoscopy for change in bowel habits lower abdominal pain showed hemorrhoids, diverticulosis nonbleeding internal hemorrhoids normal TI no specimens collected Recall 2033 05/03/2023 CT and pelvis with contrast sigmoid diverticulitis 12/01/23 CT unremarkable 11/23/2023 CBC unremarkable negative C. difficile normal inflammatory markers negative stool culture normal thyroid  Failed Viberzi  ( constipation), fiber, FD guard -Trial of xifaxin since has failed viberzi  - check HIDA -Consider colonoscopy to rule out microscopic colitis -Possible pelvis floor component, consider pelvic floor PT or anal rectal manometry.  - consider dicyclomine/amitriptyline 10 mg at night if everything is negative, prefers to avoid medications  History of hemorrhoids Status post hemorrhoidal surgery ? Contributing to symptoms  Mild GERD despite PPI, nausea, mild epigastric pain after eating, decreased appetite, no dysphagia, no melena 02/2017 abdominal ultrasound unremarkable Has never had EGD, no family history of GB issues 11/29/2023 CTAP W unremarkable gallbladder, liver Reflux gourmet and pepcid  AC - schedule HIDA -If negative plan on EGD with colon  History of adhesions disease Status post appendectomy, ruptured ovarian cyst 11/29/2023 CTAP W diverticulosis without diverticulitis no evidence of inflammation obstruction normal liver gallbladder  Diverticulosis recent diverticulitis Colonoscopy 01/07/2022 unremarkable, no specimens collected recall 2033 05/03/2023 sigmoid diverticulitis no complicating features Treated with cipro /flagyl  11/29/2023 CTAP W no diverticulitis   Patient Care Team: Daryl Setter, NP as PCP - General (Internal Medicine) Stuart Norris, NP as Nurse Practitioner (Obstetrics and Gynecology) Legrand Victory LITTIE MOULD, MD as Consulting Physician (Gastroenterology) Skeet Juliene SAUNDERS, DO as Consulting Physician (Neurology)  HISTORY OF PRESENT ILLNESS: 58 y.o. female with a past medical history listed below presents for evaluation of nausea/diarrhea for 1 year.   I last saw the patient in the office 01/26/2024 for loose stools given trial of Xifaxan  started on FDgard fiber FODMAP.  Suggested potential pelvic floor PT versus anorectal manometry.  Discussed the use of AI scribe software for clinical note transcription with the patient, who gave verbal consent to proceed.  History of Present Illness   Pamela Sanders is a 58 year old female with IBS who presents with persistent gastrointestinal symptoms.  She has been experiencing ongoing gastrointestinal issues, including loose stools primarily in the morning, since August. The stools are sometimes urgent, requiring her to 'run to the bathroom,' particularly impacting her mornings and work schedule. Symptoms improve by the afternoon and evening.  She has tried various treatments, including Zidmerzi, which caused constipation and drowsiness, leading her to discontinue it after about ten days. Benefiber causes her to have bowel movements a few hours after taking it. FD Guard has not provided noticeable improvement, and she uses Gas-X for bloating and gas.  She experiences morning nausea, described as a queasy feeling rather than an urge to vomit, and often eats saltine crackers on her way to work to manage this symptom. No weight loss or blood in her stool. Her appetite is decreased, with difficulty finding foods that sound appealing, sometimes leading her to skip lunch.  Her past diagnostic workup includes a normal CT scan in June and a normal abdominal ultrasound. She recalls having an endoscopy many years ago and seems to remember that  a hiatal hernia may have been found. She has a family history of similar gastrointestinal issues, with her mother experiencing the  same symptoms and a possible history of 'spastic colon' in her grandmother and great-grandmother.  She currently takes Pepcid  AC every evening for mild heartburn and occasionally uses Pepto for indigestion. Reflex Gourmet seems to help with indigestion. She is cautious about medications that may cause constipation due to past issues with hemorrhoids and straining.  She reports occasional mild epigastric pain, sometimes after eating, but is unable to pinpoint a specific pattern. No dark or black stools and no trouble swallowing. She mentions a history of alternating constipation and diarrhea, which has been a long-standing issue.      She  reports that she has never smoked. She has never used smokeless tobacco. She reports current alcohol use. She reports that she does not use drugs.  RELEVANT GI HISTORY, IMAGING AND LABS: Results   RADIOLOGY Abdominal CT (11/2023): Normal, no diverticulitis, gallbladder and liver normal, stomach normal. Abdominal ultrasound: Normal.      CBC    Component Value Date/Time   WBC 4.6 11/23/2023 1107   RBC 4.79 11/23/2023 1107   HGB 13.6 11/23/2023 1107   HCT 41.8 11/23/2023 1107   PLT 207.0 11/23/2023 1107   MCV 87.2 11/23/2023 1107   MCH 29.2 10/15/2021 1105   MCHC 32.6 11/23/2023 1107   RDW 12.9 11/23/2023 1107   LYMPHSABS 1.6 11/23/2023 1107   MONOABS 0.5 11/23/2023 1107   EOSABS 0.1 11/23/2023 1107   BASOSABS 0.0 11/23/2023 1107   Recent Labs    05/03/23 1219 11/23/23 1107  HGB 13.3 13.6    CMP     Component Value Date/Time   NA 141 11/23/2023 1107   K 4.2 11/23/2023 1107   CL 105 11/23/2023 1107   CO2 30 11/23/2023 1107   GLUCOSE 81 11/23/2023 1107   BUN 11 11/23/2023 1107   CREATININE 0.93 11/23/2023 1107   CREATININE 1.00 05/01/2020 1122   CALCIUM 10.2 11/23/2023 1107   PROT 6.8 11/23/2023 1107    ALBUMIN 4.4 11/23/2023 1107   AST 22 11/23/2023 1107   ALT 19 11/23/2023 1107   ALKPHOS 80 11/23/2023 1107   BILITOT 0.3 11/23/2023 1107   GFRNONAA >60 10/12/2021 1123   GFRNONAA 65 03/20/2020 1448   GFRAA 76 03/20/2020 1448      Latest Ref Rng & Units 11/23/2023   11:07 AM 05/03/2023   12:19 PM 07/22/2022   10:32 AM  Hepatic Function  Total Protein 6.0 - 8.3 g/dL 6.8  7.2  6.5   Albumin 3.5 - 5.2 g/dL 4.4  4.3  4.0   AST 0 - 37 U/L 22  21  23    ALT 0 - 35 U/L 19  21  25    Alk Phosphatase 39 - 117 U/L 80  76  82   Total Bilirubin 0.2 - 1.2 mg/dL 0.3  0.3  0.4       Current Medications:     Current Outpatient Medications (Respiratory):    diphenhydrAMINE (BENADRYL) 25 MG tablet, Take 25 mg by mouth daily as needed for itching or allergies.    loratadine (CLARITIN) 10 MG tablet, Take 10 mg by mouth daily.  Current Outpatient Medications (Analgesics):    Ubrogepant  (UBRELVY ) 100 MG TABS, Take 1 tablet (100 mg total) by mouth as needed (Take 1 tab at the earlist onset of a migraine, May repeat in 2 hours. Max 2 tabs in 24 hours).   Current Outpatient Medications (Other):    augmented betamethasone  dipropionate (DIPROLENE -AF) 0.05 % ointment, Apply topically.   b complex vitamins capsule,  Take 1 capsule by mouth 2 (two) times a week.   buPROPion  (WELLBUTRIN  XL) 300 MG 24 hr tablet, Take 1 tablet (300 mg total) by mouth daily.   calcium citrate-vitamin D  (CITRACAL+D) 315-200 MG-UNIT per tablet, Take 2 tablets by mouth daily.   CAPSAICIN EX, Apply 1 application. topically daily as needed (muscle pain).   cyclobenzaprine (FLEXERIL) 5 MG tablet, Take 5 mg by mouth as needed for muscle spasms.   famotidine  (PEPCID  AC) 10 MG chewable tablet, Chew 10 mg by mouth 2 (two) times daily as needed for heartburn.   gabapentin  (NEURONTIN ) 300 MG capsule, Take 2 capsules (600 mg total) by mouth 2 (two) times daily.   Multiple Vitamins-Minerals (ONE-A-DAY WOMENS 50 PLUS) TABS, Take 1 tablet by  mouth daily.   NON FORMULARY, benafiber   ondansetron  (ZOFRAN ) 4 MG tablet, Take 1 tablet (4 mg total) by mouth every 8 (eight) hours as needed for nausea or vomiting.   primidone  (MYSOLINE ) 50 MG tablet, Take 1 tablet (50 mg total) by mouth at bedtime.   rifaximin  (XIFAXAN ) 550 MG TABS tablet, Take 1 tablet (550 mg total) by mouth 3 (three) times daily for 14 days.   zolpidem  (AMBIEN ) 5 MG tablet, Take 1 tablet (5 mg total) by mouth at bedtime as needed for sleep   busPIRone  (BUSPAR ) 5 MG tablet, Take 1 tablet (5 mg total) by mouth 2 (two) times daily. (Patient not taking: Reported on 04/21/2024)   VIBERZI  75 MG TABS, Take 1 tablet (75 mg total) by mouth 2 (two) times daily before a meal. (Patient not taking: Reported on 04/21/2024)  Medical History:  Past Medical History:  Diagnosis Date   Allergic rhinitis 03/07/2008   Qualifier: Diagnosis of  By: Tanda MD, Jay Deal of this note might be different from the original. Overview:  Qualifier: Diagnosis of  By: Tanda MD, LauraLee   Chest pain 12/31/2019   Chest tightness 01/29/2020   Chronic inflammatory arthritis 03/07/2008   Qualifier: Diagnosis of  By: Tanda MD, Jay Deal of this note might be different from the original. Overview:  Qualifier: Diagnosis of  By: Tanda MD, LauraLee   Depression    Depression (emotion)    DIVERTICULITIS, HX OF 03/07/2008   Qualifier: Diagnosis of  By: Tanda MD, LauraLee     DJD (degenerative joint disease)    chronic neck pain   General medical examination 12/03/2010   GERD 03/07/2008   Qualifier: Diagnosis of  By: Tanda MD, Jay Deal of this note might be different from the original. Overview:  Qualifier: Diagnosis of  By: Tanda MD, Jay   GERD (gastroesophageal reflux disease)    Hip pain    History of diverticulitis of colon    Hx of diverticulitis of colon    Insomnia    Lateral epicondylitis of right elbow    Leukocytopenia    Migraine     Migraine without aura 03/07/2008   Qualifier: Diagnosis of  By: Tanda MD, LauraLee     Neck pain, chronic    NECK PAIN, CHRONIC 03/07/2008   Qualifier: Diagnosis of  By: Tanda MD, LauraLee     Osteoarthritis    Pain of left thumb    Pain of right sternoclavicular joint    Phantosmia    PONV (postoperative nausea and vomiting)    Routine general medical examination at a health care facility 08/09/2013   Sinusitis    Sternoclavicular joint pain, right 10/15/2019  TMJ (dislocation of temporomandibular joint)    Tremor of both hands 03/08/2020   Allergies:  Allergies  Allergen Reactions   Penicillins Anaphylaxis   Celebrex  [Celecoxib ]     Headaches   Shingrix  [Zoster Vac Recomb Adjuvanted]     Fever, myalgia, local redness     Surgical History:  She  has a past surgical history that includes Appendectomy; TMJ Arthroscopy; Ovarian cyst surgery; Cervical discectomy; Tissue graft (02/07/2015); Hemorroidectomy; Cervical ablation (05/2021); Colonoscopy (08/03/2014); Esophagogastroduodenoscopy; Laparoscopic bilateral salpingo oophorectomy (N/A, 10/15/2021); and Blepharoplasty (Bilateral, 09/24/2022). Family History:  Her family history includes Arthritis in her mother and another family member; Breast cancer in her maternal grandmother and another family member; Coronary artery disease in her maternal grandmother; Diverticulitis in her mother; Hypertension in an other family member; Macular degeneration in her mother; Pancreatic cancer in an other family member.  REVIEW OF SYSTEMS  : All other systems reviewed and negative except where noted in the History of Present Illness.  PHYSICAL EXAM: BP 110/72   Pulse 87   Ht 5' 7 (1.702 m)   Wt 172 lb (78 kg)   SpO2 98%   BMI 26.94 kg/m  Physical Exam   GENERAL APPEARANCE: Well nourished, in no apparent distress. HEENT: No cervical lymphadenopathy, unremarkable thyroid , sclerae anicteric, conjunctiva pink. RESPIRATORY: Respiratory  effort normal, breath sounds equal bilaterally without rales, rhonchi, or wheezing. CARDIO: Regular rate and rhythm with no murmurs, rubs, or gallops, peripheral pulses intact. ABDOMEN: Soft, non-distended, active bowel sounds in all four quadrants, tenderness in right upper quadrant on palpation, no rebound, no mass appreciated. RECTAL: Declines. MUSCULOSKELETAL: Full range of motion, normal gait, without edema. SKIN: Dry, intact without rashes or lesions. No jaundice. NEURO: Alert, oriented, no focal deficits. PSYCH: Cooperative, normal mood and affect.      Alan JONELLE Coombs, PA-C 11:50 AM

## 2024-04-21 ENCOUNTER — Ambulatory Visit: Payer: PRIVATE HEALTH INSURANCE | Admitting: Physician Assistant

## 2024-04-21 ENCOUNTER — Other Ambulatory Visit (HOSPITAL_COMMUNITY): Payer: Self-pay

## 2024-04-21 ENCOUNTER — Telehealth: Payer: Self-pay

## 2024-04-21 ENCOUNTER — Encounter: Payer: Self-pay | Admitting: Physician Assistant

## 2024-04-21 VITALS — BP 110/72 | HR 87 | Ht 67.0 in | Wt 172.0 lb

## 2024-04-21 DIAGNOSIS — R110 Nausea: Secondary | ICD-10-CM

## 2024-04-21 DIAGNOSIS — R1084 Generalized abdominal pain: Secondary | ICD-10-CM

## 2024-04-21 DIAGNOSIS — K580 Irritable bowel syndrome with diarrhea: Secondary | ICD-10-CM | POA: Diagnosis not present

## 2024-04-21 DIAGNOSIS — R197 Diarrhea, unspecified: Secondary | ICD-10-CM

## 2024-04-21 MED ORDER — RIFAXIMIN 550 MG PO TABS: 550.0000 mg | ORAL_TABLET | Freq: Three times a day (TID) | ORAL | 0 refills | Status: AC

## 2024-04-21 NOTE — Telephone Encounter (Signed)
 Pharmacy Patient Advocate Encounter   Received notification from CoverMyMeds that prior authorization for Xifaxan  550MG  tablets is required/requested.   Insurance verification completed.   The patient is insured through [employer].   Per test claim: PA required; PA submitted to above mentioned insurance via Latent Key/confirmation #[id number] Status is pending

## 2024-04-21 NOTE — Patient Instructions (Addendum)
 You have been scheduled for a HIDA scan at [facility] (1st floor) on Monday, 05/01/24. Please arrive 15 minutes prior to your scheduled appointment at  9:00 am. Make certain not to have anything to eat or drink at least 6 hours prior to your test. Should this appointment date or time not work well for you, please call radiology scheduling at [phone].   Do not take any kind of pain medication 6 hours prior to your appointment. _____________________________________________________________________ hepatobiliary (HIDA) scan is an imaging procedure used to diagnose problems in the liver, gallbladder and bile ducts. In the HIDA scan, a radioactive chemical or tracer is injected into a vein in your arm. The tracer is handled by the liver like bile. Bile is a fluid produced and excreted by your liver that helps your digestive system break down fats in the foods you eat. Bile is stored in your gallbladder and the gallbladder releases the bile when you eat a meal. A special nuclear medicine scanner (gamma camera) tracks the flow of the tracer from your liver into your gallbladder and small intestine.  During your HIDA scan  You'll be asked to change into a hospital gown before your HIDA scan begins. Your health care team will position you on a table, usually on your back. The radioactive tracer is then injected into a vein in your arm.The tracer travels through your bloodstream to your liver, where it's taken up by the bile-producing cells. The radioactive tracer travels with the bile from your liver into your gallbladder and through your bile ducts to your small intestine.You may feel some pressure while the radioactive tracer is injected into your vein. As you lie on the table, a special gamma camera is positioned over your abdomen taking pictures of the tracer as it moves through your body. The gamma camera takes pictures continually for about an hour. You'll need to keep still during the HIDA scan.  This can become uncomfortable, but you may find that you can lessen the discomfort by taking deep breaths and thinking about other things. Tell your health care team if you're uncomfortable. The radiologist will watch on a computer the progress of the radioactive tracer through your body. The HIDA scan may be stopped when the radioactive tracer is seen in the gallbladder and enters your small intestine. This typically takes about an hour. In some cases extra imaging will be performed if original images aren't satisfactory, if morphine is given to help visualize the gallbladder or if the medication CCK is given to look at the contraction of the gallbladder. This test typically takes 2 hours to complete. ________________________________________________________________________   Biliary Dyskinesia  Biliary dyskinesia is a condition in which the gallbladder or bile ducts cannot release or move bile normally. Bile is a fluid that is made in the liver to help the body digest food. Bile flows to the gallbladder to be stored. When bile is needed for digestion, it leaves the gallbladder and flows through the bile ducts into the digestive tract. Biliary dyskinesia causes bile to build up, and that can cause pain in the abdomen. This condition may also be called: Acalculous cholecystopathy. Functional gallbladder disorder. Sphincter of Oddi dysfunction. This is one type of biliary dyskinesia. What are the causes? The cause of biliary dyskinesia is poor function of the gallbladder. The exact reason this happens is often unknown. One reason may be changes in the gallbladder that are caused by obesity. What increases the risk? A person is more likely to  develop this condition if his or her mother or father had it. It may also develop if the person is: Overweight. Female. 41-9 years old. What are the signs or symptoms? The main symptom of this condition is pain in the upper right side of the abdomen. Typically,  the pain: Starts about 30 minutes after a meal, especially a meal that is spicy or greasy. Lasts for 30 minutes or longer. Builds up gradually until it is a steady pain that is severe enough to interrupt daily activities. Other symptoms may include: Nausea or vomiting. Sweating. Cramping or bloating in the abdomen. Heartburn or belching. Diarrhea. How is this diagnosed? This condition may be diagnosed based on your symptoms, your medical history, and a physical exam. You may have tests to rule out other conditions and to confirm the diagnosis. Tests may include: Blood tests. Ultrasound tests of the gallbladder. Hepatobiliary iminodiacetic acid (HIDA) scan. This is an X-ray test that can show if your gallbladder empties less than a normal amount of bile (gallbladder ejection fraction). MRI or CT scan of the abdomen. ERCP (endoscopic retrograde cholangiopancreatogram). During this procedure, a thin tube with a camera on the end is inserted into the throat and down into areas that need to be examined, such as the pancreas, bile ducts, liver, and gallbladder. Dye is injected into your blood, and then X-rays are done. The dye helps your health care provider see the areas to be examined. ERCP may be done to help diagnose sphincter of Oddi dysfunction. How is this treated? Treatment depends on the cause. Usually, the first step of treatment is to make lifestyle changes. Your health care provider may recommend: Resting. Losing weight. Avoiding foods that are spicy, greasy, or fatty. Taking over-the-counter or prescription pain medicine. In some cases, the condition gets better with lifestyle changes only. However, in many cases, surgery to remove the gallbladder (cholecystectomy) is needed. Follow these instructions at home: Medicines Take over-the-counter and prescription medicines only as told by your health care provider. Ask your health care provider if the medicine prescribed to  you: Requires you to avoid driving or using machinery. Can cause constipation. You may need to take these actions to prevent or treat constipation: Drink enough fluid to keep your urine pale yellow. Take over-the-counter or prescription medicines. Eat foods that are high in fiber, such as beans, whole grains, and fresh fruits and vegetables. Limit foods that are high in fat and processed sugars, such as fried or sweet foods. Alcohol use Alcohol can irritate your stomach and your liver. If you drink alcohol: Limit how much you have to: 0-1 drink a day for women who are not pregnant. 0-2 drinks a day for men. Know how much alcohol is in a drink. In the [place] one drink equals one 12 oz bottle of beer (355 mL), one 5 oz glass of wine (148 mL), or one 1 oz glass of hard liquor (44 mL). General instructions Rest and return to your normal activities as told by your health care provider. Ask your health care provider what activities are safe for you. Follow instructions from your health care provider about eating or drinking restrictions. You may need to limit fatty, greasy, and spicy foods if they cause symptoms. Do not use any products that contain nicotine or tobacco. These products include cigarettes, chewing tobacco, and vaping devices, such as e-cigarettes. If you need help quitting, ask your health care provider. Smoking can damage your digestive system. Keep all follow-up visits. This is important.  Contact a health care provider if: Pain in your abdomen returns. Your symptoms become more severe or continue for more than 3 months. You have any of the following: Nausea or vomiting. Diarrhea. Cramping or bloating in your abdomen. Summary Biliary dyskinesia is a condition in which your gallbladder or bile ducts cannot release or move bile normally. The main symptom of this condition is pain in the upper right side of the abdomen. The pain typically starts about 30 minutes after a meal,  especially a meal that is spicy or greasy. Treatment depends on the cause and usually involves lifestyle changes, such as working to lose weight and avoiding certain foods. In many cases, surgery to remove the gallbladder (cholecystectomy) is needed. This information is not intended to replace advice given to you by your health care provider. Make sure you discuss any questions you have with your health care provider. Document Revised: 04/08/2022 Document Reviewed: 04/08/2022 Elsevier Patient Education  2024 Elsevier Inc.  Small intestinal bacterial overgrowth (SIBO) occurs when there is an abnormal increase in the overall bacterial population in the small intestine -- particularly types of bacteria not commonly found in that part of the digestive tract. Small intestinal bacterial overgrowth (SIBO) commonly results when a circumstance -- such as surgery or disease -- slows the passage of food and waste products in the digestive tract, creating a breeding ground for bacteria.  Signs and symptoms of SIBO often include: Loss of appetite Abdominal pain Nausea Bloating An uncomfortable feeling of fullness after eating Diarrhea or constipation, depending on the type of gas produced  What foods trigger SIBO? While foods aren't the original cause of SIBO, certain foods do encourage the overgrowth of the wrong bacteria in your small intestine. If you're feeding them their favorite foods, they're going to grow more, and that will trigger more of your SIBO symptoms. By the same token, you can help reduce the overgrowth by starving the problematic bacteria of their favorite foods. This strategy has led to a number of proposed SIBO eating plans. The plans vary, and so do individual results. But in general, they tend to recommend limiting carbohydrates.  These include: Sugars and sweeteners. Fruits and starchy vegetables. Dairy products. Grains.  There is a test for this we can do called a breath test,  if you are positive we will treat you with an antibiotic to see if it helps.  Your symptoms are very suspicious for this condition, as discussed, we will start you on an antibiotic to see if this helps.    Thank you for trusting me with your gastrointestinal care!   Alan Coombs, PA-C  _______________________________________________________  If your blood pressure at your visit was 140/90 or greater, please contact your primary care physician to follow up on this.  _______________________________________________________  If you are age 59 or older, your body mass index should be between 23-30. Your Body mass index is 26.94 kg/m. If this is out of the aforementioned range listed, please consider follow up with your Primary Care Provider.  If you are age 17 or younger, your body mass index should be between 19-25. Your Body mass index is 26.94 kg/m. If this is out of the aformentioned range listed, please consider follow up with your Primary Care Provider.   ________________________________________________________  The [facility] providers would like to encourage you to use MYCHART to communicate with providers for non-urgent requests or questions.  Due to long hold times on the telephone, sending your provider a message by [facility]  may be a faster and more efficient way to get a response.  Please allow 48 business hours for a response.  Please remember that this is for non-urgent requests.  _______________________________________________________  [facility] is using a team-based approach to care.  Your team is made up of your doctor and two to three APPS. Our APPS (Nurse Practitioners and Physician Assistants) work with your physician to ensure care continuity for you. They are fully qualified to address your health concerns and develop a treatment plan. They communicate directly with your gastroenterologist to care for you. Seeing the Advanced Practice Practitioners on your  physician's team can help you by facilitating care more promptly, often allowing for earlier appointments, access to diagnostic testing, procedures, and other specialty referrals.

## 2024-04-21 NOTE — Telephone Encounter (Signed)
 Pharmacy Patient Advocate Encounter  Received notification from [facility] that Prior Authorization for Xifaxan  550MG  tablets has been APPROVED from 11-14-205 to 05-05-2024. Ran test claim, Copay is $0.00. This test claim was processed through [facility]- copay amounts may vary at other pharmacies due to pharmacy/plan contracts, or as the patient moves through the different stages of their insurance plan.   PA #/Case ID/Reference #: [id number]

## 2024-05-01 ENCOUNTER — Encounter (HOSPITAL_COMMUNITY)
Admission: RE | Admit: 2024-05-01 | Discharge: 2024-05-01 | Disposition: A | Discharge status: Home / Self Care | Source: Ambulatory Visit | Attending: Physician Assistant | Admitting: Physician Assistant

## 2024-05-01 ENCOUNTER — Encounter (HOSPITAL_COMMUNITY): Payer: PRIVATE HEALTH INSURANCE

## 2024-05-01 DIAGNOSIS — R110 Nausea: Secondary | ICD-10-CM | POA: Diagnosis present

## 2024-05-01 DIAGNOSIS — R1084 Generalized abdominal pain: Secondary | ICD-10-CM | POA: Insufficient documentation

## 2024-05-01 MED ORDER — TECHNETIUM TC 99M MEBROFENIN IV KIT
5.0000 | PACK | Freq: Once | INTRAVENOUS | Status: AC | PRN
  Administered 2024-05-01: 5.5 via INTRAVENOUS

## 2024-05-02 ENCOUNTER — Ambulatory Visit: Payer: Self-pay | Admitting: Physician Assistant

## 2024-05-17 ENCOUNTER — Other Ambulatory Visit: Payer: Self-pay | Admitting: Family

## 2024-05-23 ENCOUNTER — Telehealth: Payer: Self-pay | Admitting: Neurology

## 2024-05-23 ENCOUNTER — Encounter: Payer: Self-pay | Admitting: Neurology

## 2024-05-23 NOTE — Telephone Encounter (Signed)
 Pamela Sanders: Self LVM stating that she sent a message to Mychart. She stated that she has been having some vertigo and migraine at the same time for the last 2 days.   PH: [phone]

## 2024-05-24 ENCOUNTER — Telehealth: Payer: Self-pay | Admitting: Neurology

## 2024-05-24 NOTE — Telephone Encounter (Signed)
 See my chart message

## 2024-05-24 NOTE — Telephone Encounter (Signed)
 Pt called in this morning and She stated that she has been having some vertigo and migraine at the same time for the last 3 days. Thanks

## 2024-05-24 NOTE — Progress Notes (Unsigned)
 NEUROLOGY FOLLOW UP OFFICE NOTE  Pamela Sanders [id number]  Assessment/Plan:   Vestibular migraine Chronic tension-type headache, cervicogenic Essential tremor Migraine with and without aura, without status migrainosus, intractable Chronic neck pain s/p ACDF and radioablation    Tremor management:  primidone  50mg  at bedtime Migraine prevention:  Deferred.  Would consider zonidamide if needed. Migraine rescue:  Ubrelvy  100mg  Cervicogenic headache:  On Flexeril.  Scheduled for ablation.  If ineffective, repeat MRI of cervical spine Lifestyle modification: Limit use of pain relievers to no more than 9 days out of the month to prevent risk of rebound or medication-overuse headache. Diet modification/hydration/caffeine cessation Routine exercise Sleep hygiene Consider vitamins/supplements:  magnesium  citrate 400mg  daily, riboflavin 400mg  daily, CoQ10 100mg  three times daily Keep headache diary Follow up 6 months.    Subjective:  Pamela Sanders is a 58 year old right-handed female with degenerative joint disease with chronic neck pain s/p ACDF C5-C6 (2011) whom I see for migraine and tremor presents today for vertigo.     UPDATE: Vertigo:   Tremor: Currently taking primidone  50mg  at bedtime Tremors are controlled.  She is able to work in the lab.    Migraines: Occurs 1 once every month to every 2 months at most.  Responded to Ubrelvy .  Aborts quickly.   Cervicogenic/tension-type headaches: Has chronic neck and shoulder pain.  Headaches are now daily.  Mild-moderate severity.  Starts in back of head but radiates to forehead.  Pressure-like pain.  Takes Flexeril.  Takes aleve or Tylenol  but not frequently.  Followed up with her pain doctor who suggested another cervical nerve ablation which she has scheduled.     Current NSAIDS:  Ibuprofen  (for tension-type headache) Current analgesics:  Tylenol  (for tension-type headache) Current triptans:  none Current ergotamine:   no Current anti-emetic:  Zofran  4mg  Current muscle relaxants:  no Current sleep aide:  Ambien  Current Antihypertensive medications:  no Current Antidepressant medications:  Wellbutrin  XL 150mg  daily Current Anticonvulsant medications:  Gabapentin  600mg  twice daily, primidone  50mg  at bedtime (ET) Current anti-CGRP:  Ubrelvy  100mg  Current Vitamins/Herbal/Supplements:  no Current Antihistamines/Decongestants: Benadryl, Claritin Other therapy:  no Other medications:  Primidone  50mg  at bedtime; Ambien    HISTORY: Essential tremor: She started noticing tremor in 2021.  It is bilateral, but worse on the right.  It occurs when hands are with use or outstretched.  It does not occur at rest.  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  Her hands will shake.  No trouble swallowing.  No trouble with walking.  No history of REM sleep behavior disorder.  She does occasionally drink wine but has not noticed if symptoms subside when drinking.  She reports that her great grandmother had Parkinson's disease but no known family history of essential tremor.   Works in a lab papet tremors fluctuates right worse than left - over a year -    Great grandmother had PD.  No family history of tremors    Migraines/Tension-type headache/chronic neck pain: Onset:  1999 Location:  Left or right frontal/retro-orbital, sometimes occipital, chronic left sided neck pain.  She has constant pain in left jaw (history of jaw surgery for TMJ.  wears a retainer and invisalign  Quality:  Stabbing/throbbing Intensity:  Moderate to severe.  Aura:  Sometimes bluish vision.  One time had tunnel vision in 2005. Prodrome:  no Postdrome:  no Associated symptoms:  Photophobia, phonophobia.  Sometimes tinnitus.  She denies associated nausea, vomiting, visual disturbance, autonomic symptoms or unilateral numbness or  weakness. Duration:  1/2 to 1 day Frequency:  Once every 5 to 6 months Frequency of abortive medication: 2 days a week Triggers/exacerbating factors:  Emotional stress, skipped meals, sleep deprivation Relieving factors:  Laying down in dark and quiet room Activity:  Avoids   She also has tension-type headaches in the back of her head radiating into the neck as well as headaches at the bridge of her nose.  They last a day.  They occur 2 days a week.     Imaging: 01/10/2013  MRI BRAIN W WO:  1.  No acute intracranial abnormality.  2.  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   04/14/2020 MRI C-SPINE WO:  Mild foraminal narrowing bilaterally at C3-4 and C6-7 due to spurring.  ACDF C5-6 without stenosis.  Past medications: Past NSAIDS:  etodolac Past analgesics:  no Past abortive triptans:  Sumatriptan  (causes nausea) Past abortive ergotamine:  no Past muscle relaxants:  Tizanidine  4mg  (helped) Past anti-emetic:  Compazine (used for sumatriptan -induced nausea) Past antihypertensive medications:  no Past antidepressant medications:  Nortriptyline (did not tolerate), Lexapro, Prozac - didn't tolerate SSRIs.   Past anticonvulsant medications:  Topiramate (disoriented), Keppra Past anti-CGRP:  no Past vitamins/Herbal/Supplements:  no Past antihistamines/decongestants:  no Other past therapies:  Botox (effective but caused welts and itching), massage, caffeine  PAST MEDICAL HISTORY: Past Medical History:  Diagnosis Date   Allergic rhinitis 03/07/2008   Qualifier: Diagnosis of  By: Tanda MD, Jay Deal of this note might be different from the original. Overview:  Qualifier: Diagnosis of  By: Tanda MD, LauraLee   Chest pain 12/31/2019   Chest tightness 01/29/2020   Chronic inflammatory arthritis 03/07/2008   Qualifier: Diagnosis of  By: Tanda MD, Jay Deal of this note might be different from the original. Overview:  Qualifier:  Diagnosis of  By: Tanda MD, LauraLee   Depression    Depression (emotion)    DIVERTICULITIS, HX OF 03/07/2008   Qualifier: Diagnosis of  By: Tanda MD, LauraLee     DJD (degenerative joint disease)    chronic neck pain   General medical examination 12/03/2010   GERD 03/07/2008   Qualifier: Diagnosis of  By: Tanda MD, Jay Deal of this note might be different from the original. Overview:  Qualifier: Diagnosis of  By: Tanda MD, Jay   GERD (gastroesophageal reflux disease)    Hip pain    History of diverticulitis of colon    Hx of diverticulitis of colon    Insomnia    Lateral epicondylitis of right elbow    Leukocytopenia    Migraine    Migraine without aura 03/07/2008   Qualifier: Diagnosis of  By: Tanda MD, LauraLee     Neck pain, chronic    NECK PAIN, CHRONIC 03/07/2008   Qualifier: Diagnosis of  By: Tanda MD, LauraLee     Osteoarthritis    Pain of left thumb    Pain of right sternoclavicular joint    Phantosmia    PONV (postoperative nausea and vomiting)    Routine general medical examination at a health care facility 08/09/2013   Sinusitis    Sternoclavicular joint pain, right 10/15/2019   TMJ (dislocation of temporomandibular joint)    Tremor of both hands 03/08/2020    MEDICATIONS: Medications Ordered Prior to Encounter[1]  ALLERGIES: Allergies[2]  FAMILY HISTORY: Family History  Problem Relation Age of Onset   Diverticulitis Mother    Arthritis Mother    Macular degeneration Mother    Coronary artery disease Maternal Grandmother    Breast cancer Maternal Grandmother        carcinoid tumor in abd   Arthritis Other    Breast cancer Other    Hypertension Other    Pancreatic cancer Other       Objective:  *** General: No acute distress.  Patient appears ***-groomed.   Head:  Normocephalic/atraumatic Eyes:  Fundi examined but not visualized Neck: supple, no paraspinal tenderness, full range of motion Heart:  Regular rate and  rhythm Neurological Exam: alert and oriented.  Speech fluent and not dysarthric, language intact.  CN II-XII intact. Bulk and tone normal, muscle strength 5/5 throughout.  Sensation to light touch intact.  Deep tendon reflexes 2+ throughout, toes downgoing.  Finger to nose testing intact.  Gait normal, Romberg negative.   Juliene Dunnings, DO  CC: ***          [1]  Current Outpatient Medications on File Prior to Visit  Medication Sig Dispense Refill   augmented betamethasone  dipropionate (DIPROLENE -AF) 0.05 % ointment Apply topically.     b complex vitamins capsule Take 1 capsule by mouth 2 (two) times a week.     buPROPion  (WELLBUTRIN  XL) 300 MG 24 hr tablet Take 1 tablet (300 mg total) by mouth daily. 90 tablet 0   busPIRone  (BUSPAR ) 5 MG tablet Take 1 tablet (5 mg total) by mouth 2 (two) times daily. (Patient not taking: Reported on 04/21/2024) 180 tablet 0   calcium citrate-vitamin D  (CITRACAL+D) 315-200 MG-UNIT per tablet Take 2 tablets by mouth daily.     CAPSAICIN EX Apply 1 application. topically daily as needed (muscle pain).     cyclobenzaprine (FLEXERIL) 5 MG tablet Take 5 mg by mouth as needed for muscle spasms.     diphenhydrAMINE (BENADRYL) 25 MG tablet Take 25 mg by mouth daily as needed for itching or allergies.      famotidine  (PEPCID  AC) 10 MG chewable tablet Chew 10 mg by mouth 2 (two) times daily as needed for heartburn.     gabapentin  (NEURONTIN ) 300 MG capsule Take 2 capsules (600 mg total) by mouth 2 (two) times daily. 360 capsule 0   loratadine (CLARITIN) 10 MG tablet Take 10 mg by mouth daily.     Multiple Vitamins-Minerals (ONE-A-DAY WOMENS 50 PLUS) TABS Take 1 tablet by mouth daily.     NON FORMULARY benafiber     ondansetron  (ZOFRAN ) 4 MG tablet Take 1 tablet (4 mg total) by mouth every 8 (eight) hours as needed for nausea or vomiting. 20 tablet 0   primidone  (MYSOLINE ) 50 MG tablet Take 1 tablet (50 mg total) by mouth at bedtime. 30 tablet 11   Ubrogepant   (UBRELVY ) 100 MG TABS Take 1 tablet (100 mg total) by mouth as needed (Take 1 tab at the earlist onset of a migraine, May repeat in 2 hours. Max 2 tabs in 24 hours). 16 tablet 11   VIBERZI  75 MG TABS Take 1 tablet (75 mg total) by mouth 2 (two) times daily before a meal. (Patient not taking: Reported on 04/21/2024) 60 tablet 3   zolpidem  (AMBIEN ) 5 MG tablet Take 1 tablet (5 mg total) by mouth at bedtime as needed for sleep 30 tablet 0   No current facility-administered medications on file prior to visit.  [2]  Allergies Allergen Reactions  Penicillins Anaphylaxis   Celebrex  [Celecoxib ]     Headaches   Shingrix  [Zoster Vac Recomb Adjuvanted]     Fever, myalgia, local redness

## 2024-05-25 ENCOUNTER — Ambulatory Visit: Payer: PRIVATE HEALTH INSURANCE | Admitting: Family Medicine

## 2024-05-25 ENCOUNTER — Other Ambulatory Visit (HOSPITAL_BASED_OUTPATIENT_CLINIC_OR_DEPARTMENT_OTHER): Payer: Self-pay

## 2024-05-25 ENCOUNTER — Ambulatory Visit: Payer: PRIVATE HEALTH INSURANCE | Admitting: Neurology

## 2024-05-25 ENCOUNTER — Other Ambulatory Visit: Payer: Self-pay | Admitting: Neurology

## 2024-05-25 VITALS — BP 144/87 | HR 80 | Ht 67.0 in | Wt 167.8 lb

## 2024-05-25 DIAGNOSIS — R42 Dizziness and giddiness: Secondary | ICD-10-CM

## 2024-05-25 DIAGNOSIS — R270 Ataxia, unspecified: Secondary | ICD-10-CM

## 2024-05-25 MED ORDER — PREDNISONE 10 MG PO TABS
ORAL_TABLET | ORAL | 0 refills | Status: AC
  Filled 2024-05-25: qty 21, 6d supply, fill #0

## 2024-05-25 MED FILL — Ondansetron Orally Disintegrating Tab 4 MG: 4.0000 mg | ORAL | 7 days supply | Qty: 20 | Fill #0 | Status: AC

## 2024-05-25 NOTE — Patient Instructions (Addendum)
 Take prednisone  taper as directed Take Nurtec one tablet daily as needed.  Take Zofran -ODT for nausea Check CTA of head and neck Refer to vestibular rehab. Follow up 4 months

## 2024-05-26 ENCOUNTER — Other Ambulatory Visit (HOSPITAL_BASED_OUTPATIENT_CLINIC_OR_DEPARTMENT_OTHER): Payer: Self-pay

## 2024-05-26 ENCOUNTER — Other Ambulatory Visit: Payer: Self-pay | Admitting: Neurology

## 2024-05-26 MED ORDER — ZONISAMIDE 50 MG PO CAPS
ORAL_CAPSULE | ORAL | 0 refills | Status: DC
  Filled 2024-05-26: qty 60, 30d supply, fill #0

## 2024-05-31 ENCOUNTER — Telehealth: Payer: Self-pay | Admitting: Physician Assistant

## 2024-05-31 ENCOUNTER — Telehealth: Payer: PRIVATE HEALTH INSURANCE | Admitting: Family Medicine

## 2024-05-31 DIAGNOSIS — K5900 Constipation, unspecified: Secondary | ICD-10-CM

## 2024-05-31 NOTE — Telephone Encounter (Signed)
 Patient with large amount of stool that is difficult to pass.  Has resorted to glove and finger manipulation, but only able to get small amount out.  Rectal area tender and feels swollen which is impacting ability to pass. Has taken Miralax and stool softeners yesterday and today. .  Asking if Fleets would be appropriate or further recommendation.

## 2024-05-31 NOTE — Telephone Encounter (Signed)
 Is the KUB if she doesn't get relief  or you want her to go get it regardless?  Today?

## 2024-05-31 NOTE — Telephone Encounter (Signed)
 Inbound call from patient stating that she is having constipation issues and abdominal pain. Patient would like to speak to the nurse in regards to her symptoms. Please advise.

## 2024-05-31 NOTE — Telephone Encounter (Signed)
 Spoke with patient, provided recommendations and sent copy through MyChart.  Patient voice understanding.

## 2024-05-31 NOTE — Progress Notes (Signed)
" °  Because of your current symptoms, I feel your condition warrants further evaluation and I recommend that you be seen in a face-to-face visit.   NOTE: There will be NO CHARGE for this E-Visit   If you are having a true medical emergency, please call 911.     For an urgent face to face visit, [place] has multiple urgent care centers for your convenience.  Click the link below for the full list of locations and hours, walk-in wait times, appointment scheduling options and driving directions:  Urgent Care - [place], [place], [place], [place], [place], [place]  [place]     Your MyChart E-visit questionnaire answers were reviewed by a board certified advanced clinical practitioner to complete your personal care plan based on your specific symptoms.    Thank you for using e-Visits.    I have spent 5 minutes in review of e-visit questionnaire, review and updating patient chart, medical decision making and response to patient.   Roosvelt Mater, PA-C     "

## 2024-06-06 ENCOUNTER — Ambulatory Visit
Admission: RE | Admit: 2024-06-06 | Discharge: 2024-06-06 | Disposition: A | Discharge status: Home / Self Care | Payer: PRIVATE HEALTH INSURANCE | Source: Ambulatory Visit | Attending: Physician Assistant | Admitting: Physician Assistant

## 2024-06-06 ENCOUNTER — Inpatient Hospital Stay
Admission: RE | Admit: 2024-06-06 | Discharge: 2024-06-06 | Disposition: A | Payer: PRIVATE HEALTH INSURANCE | Source: Ambulatory Visit | Attending: Neurology

## 2024-06-06 DIAGNOSIS — R270 Ataxia, unspecified: Secondary | ICD-10-CM

## 2024-06-06 DIAGNOSIS — R42 Dizziness and giddiness: Secondary | ICD-10-CM

## 2024-06-06 DIAGNOSIS — K5900 Constipation, unspecified: Secondary | ICD-10-CM | POA: Diagnosis not present

## 2024-06-06 MED ORDER — IOPAMIDOL (ISOVUE-370) INJECTION 76%
75.0000 mL | Freq: Once | INTRAVENOUS | Status: AC | PRN
  Administered 2024-06-06: 75 mL via INTRAVENOUS

## 2024-06-14 ENCOUNTER — Ambulatory Visit: Payer: Self-pay | Admitting: Neurology

## 2024-06-14 ENCOUNTER — Ambulatory Visit: Payer: Self-pay | Admitting: Physician Assistant

## 2024-06-15 ENCOUNTER — Other Ambulatory Visit: Payer: Self-pay

## 2024-06-15 MED ORDER — LINACLOTIDE 290 MCG PO CAPS: 290.0000 ug | ORAL_CAPSULE | Freq: Every day | ORAL | Status: DC

## 2024-06-15 MED ORDER — CLENPIQ 10-3.5-12 MG-GM -GM/160ML PO SOLN: 1.0000 | ORAL | 0 refills | Status: AC

## 2024-06-15 NOTE — Telephone Encounter (Signed)
 Samples placed at 2nd floor front desk.

## 2024-06-26 ENCOUNTER — Encounter: Payer: Self-pay | Admitting: Neurology

## 2024-06-27 ENCOUNTER — Encounter: Payer: Self-pay | Admitting: Neurology

## 2024-06-28 ENCOUNTER — Other Ambulatory Visit: Payer: Self-pay | Admitting: Neurology

## 2024-06-28 MED ORDER — ZONISAMIDE 100 MG PO CAPS: 100.0000 mg | ORAL_CAPSULE | Freq: Every day | ORAL | 5 refills | Status: AC

## 2024-06-29 ENCOUNTER — Ambulatory Visit (INDEPENDENT_AMBULATORY_CARE_PROVIDER_SITE_OTHER): Payer: PRIVATE HEALTH INSURANCE | Admitting: Otolaryngology

## 2024-06-29 ENCOUNTER — Ambulatory Visit: Attending: Neurology | Admitting: Rehabilitation

## 2024-06-29 ENCOUNTER — Encounter: Payer: Self-pay | Admitting: Rehabilitation

## 2024-06-29 ENCOUNTER — Other Ambulatory Visit: Payer: Self-pay

## 2024-06-29 DIAGNOSIS — M542 Cervicalgia: Secondary | ICD-10-CM | POA: Insufficient documentation

## 2024-06-29 DIAGNOSIS — R42 Dizziness and giddiness: Secondary | ICD-10-CM | POA: Diagnosis present

## 2024-06-29 DIAGNOSIS — R293 Abnormal posture: Secondary | ICD-10-CM | POA: Diagnosis present

## 2024-06-29 NOTE — Therapy (Signed)
 " OUTPATIENT PHYSICAL THERAPY VESTIBULAR EVALUATION     Patient Name: Pamela Sanders MRN: [id number] DOB:1965/12/21, 59 y.o., female Today's Date: 06/29/2024  END OF SESSION:  PT End of Session - 06/29/24 0848     Visit Number 1    Date for Recertification  08/24/24    PT Start Time 0847    PT Stop Time 0930    PT Time Calculation (min) 43 min    Activity Tolerance Patient tolerated treatment well    Behavior During Therapy [facility] for tasks assessed/performed          Past Medical History:  Diagnosis Date   Allergic rhinitis 03/07/2008   Qualifier: Diagnosis of  By: Tanda MD, Jay Deal of this note might be different from the original. Overview:  Qualifier: Diagnosis of  By: Tanda MD, LauraLee   Chest pain 12/31/2019   Chest tightness 01/29/2020   Chronic inflammatory arthritis 03/07/2008   Qualifier: Diagnosis of  By: Tanda MD, Jay Deal of this note might be different from the original. Overview:  Qualifier: Diagnosis of  By: Tanda MD, LauraLee   Depression    Depression (emotion)    DIVERTICULITIS, HX OF 03/07/2008   Qualifier: Diagnosis of  By: Tanda MD, LauraLee     DJD (degenerative joint disease)    chronic neck pain   General medical examination 12/03/2010   GERD 03/07/2008   Qualifier: Diagnosis of  By: Tanda MD, Jay Deal of this note might be different from the original. Overview:  Qualifier: Diagnosis of  By: Tanda MD, Jay   GERD (gastroesophageal reflux disease)    Hip pain    History of diverticulitis of colon    Hx of diverticulitis of colon    Insomnia    Lateral epicondylitis of right elbow    Leukocytopenia    Migraine    Migraine without aura 03/07/2008   Qualifier: Diagnosis of  By: Tanda MD, LauraLee     Neck pain, chronic    NECK PAIN, CHRONIC 03/07/2008   Qualifier: Diagnosis of  By: Tanda MD, LauraLee     Osteoarthritis    Pain of left thumb    Pain of right sternoclavicular joint     Phantosmia    PONV (postoperative nausea and vomiting)    Routine general medical examination at a health care facility 08/09/2013   Sinusitis    Sternoclavicular joint pain, right 10/15/2019   TMJ (dislocation of temporomandibular joint)    Tremor of both hands 03/08/2020   Past Surgical History:  Procedure Laterality Date   APPENDECTOMY     BLEPHAROPLASTY Bilateral 09/24/2022   CERVICAL ABLATION  05/2021   CERVICAL DISCECTOMY     Dr. Alvan 11/2009- [facility]   COLONOSCOPY  08/03/2014   ESOPHAGOGASTRODUODENOSCOPY     Says it was normal but maybe a peptic ulcer. Early 2000's possibly [facility]   HEMORROIDECTOMY     LAPAROSCOPIC BILATERAL SALPINGO OOPHERECTOMY N/A 10/15/2021   Procedure: LAPAROSCOPIC BILATERAL SALPINGOOPHERECTOMY AND REMOVAL OF UTERINE MASS/PELVIC WASHINGS;  Surgeon: Barbette Knock, MD;  Location: [facility] OR;  Service: Gynecology;  Laterality: N/A;   OVARIAN CYST SURGERY     abdominal surgery for ovarian cyst   TISSUE GRAFT  02/07/2015   pt reports gum graft for receeding gums--Dr Rea   TMJ ARTHROSCOPY     TMJ surgery   Patient Active Problem List   Diagnosis Date Noted   Anxiety 07/22/2022   Hyperglycemia 06/02/2022   Hypercalcemia  06/02/2022   Borderline diabetes 02/06/2022   Status post cervical spinal fusion 10/14/2020   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 08/29/2020   DJD (degenerative joint disease)    GERD (gastroesophageal reflux disease)    Osteoarthritis    Tremor of both hands 03/08/2020   Sternoclavicular joint pain, right 10/15/2019   Insomnia 10/16/2015   TMJ (dislocation of temporomandibular joint) 02/07/2015   Preventative health care 08/09/2013   Nausea 12/03/2010   General medical examination 12/03/2010   Bronchitis 09/30/2009   Leukocytopenia 03/09/2008   Depression 03/07/2008   Migraine without aura 03/07/2008   Allergic rhinitis 03/07/2008   Chronic inflammatory arthritis 03/07/2008   DIVERTICULITIS, HX OF 03/07/2008     PCP:  REFERRING PROVIDER: Dr. Skeet  REFERRING DIAG: R42 (ICD-10-CM) - Vertigo R27.0 (ICD-10-CM) - Ataxia  THERAPY DIAG:  Dizziness and giddiness  Cervicalgia  Abnormal posture  ONSET DATE: 1 months ago  Rationale for Evaluation and Treatment: Rehabilitation  SUBJECTIVE:   SUBJECTIVE STATEMENT: 59 y/o patient referred to PT for vertigo, ataxia.   Patient states onset was acute about a month ago for no apparent reason.   Never had vertigo previously.   She does have a h/o C5-6 fusion, tinnitus R ear, hearing loss R ear, migraine headaches.   She saw Dr Skeet and was given a steroid dose pack that really helped and symptoms seemed to resolve.   However, her symptoms returned about a week ago for no apparent reason.   States symptoms are worse with getting up and down from bed and it causes her to stagger to the R.   She has not fallen but has to catch herself.  She states she is fine if she keeps her head still.   Driving does not bother her.  Closing her eyes actually worsens symptoms.   She has [place] with it.  Symptoms are actually more constant with walking and better with sitting.   Lying on R side severely worsens.  Symptoms feel that the room is spinning.   Prior treatment for her migraines have been botox injections 20 years ago that really helped resolve them.   She is now on zonisamide  which is also helping.   She only gets occasional migraines.    She works for [employer] in the [employer].  She has a PhD in education officer, community and molecular biology.  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     Pt accompanied by: self  PERTINENT HISTORY: chronic inflammatory arthritis, migraines, chronic neck pain with neck surgery, insomnia, TMJ dislocation and surgery, depression, bronchitis  PAIN:  Are you having pain? Yes: NPRS scale: 7/10 neck pain on L  side;  2/10 best Pain location: posterior and lateral neck each side Pain description: aching constant Aggravating factors: poor positioning/posture Relieving factors: rest, massages  PRECAUTIONS: None  RED FLAGS: None   WEIGHT BEARING RESTRICTIONS: No  FALLS: Has patient fallen in last 6 months? No  LIVING ENVIRONMENT: Lives with: unknown, not asked Lives in: Other not asked Stairs: no asked Has following equipment at home: None  PLOF: Independent with gait  PATIENT GOALS:   OBJECTIVE:  Note: Objective measures were completed at Evaluation unless otherwise noted.  DIAGNOSTIC FINDINGS: EXAM: CTA HEAD AND NECK WITHOUT AND WITH 06/06/2024 03:02:18 PM   TECHNIQUE: CTA of the head and neck was performed without  and with the administration of intravenous contrast. Multiplanar 2D and/or 3D reformatted images are provided for review. Automated exposure control, iterative reconstruction, and/or weight based adjustment of the mA/kV was utilized to reduce the radiation dose to as low as reasonably achievable. Stenosis of the internal carotid arteries measured using NASCET criteria.   CONTRAST: 75 mL of Isovue  370.   COMPARISON: MRI head 09/06/2023   CLINICAL HISTORY: Vertigo and ataxia 3 weeks ago. Temporomandibular joint cervical discectomy in 2011.   FINDINGS:   CTA NECK: AORTIC ARCH AND ARCH VESSELS: No dissection or arterial injury. No significant stenosis of the brachiocephalic or subclavian arteries.   CERVICAL CAROTID ARTERIES: Mild tortuosity of the proximal right cervical ICA and mild tortuosity of the mid left cervical ICA. No high grade stenosis of the carotid arteries in the neck. No dissection or arterial injury.   CERVICAL VERTEBRAL ARTERIES: No dissection, arterial injury, or significant stenosis.   LUNGS AND MEDIASTINUM: Unremarkable.   SOFT TISSUES: Postsurgical changes of bilateral mandibular osteotomies.   BONES: Anterior cervical fusion  hardware at C5-C6. Degenerative changes in the visualized spine.   CTA HEAD: ANTERIOR CIRCULATION: Minimal atherosclerosis in the left paracentral siphon without significant stenosis. There is a 2 mm conical outpouching along the right supraclinoid ICA at the origin of the right posterior communicating artery favored to reflect an infundibulum. No significant stenosis of the anterior cerebral arteries. No significant stenosis of the middle cerebral arteries. No aneurysm.   POSTERIOR CIRCULATION: No significant stenosis of the posterior cerebral arteries. No significant stenosis of the basilar artery. No significant stenosis of the vertebral arteries. No aneurysm.   OTHER: Branching focus of vasculature within the right frontal lobe with a draining vein extending into the central sulcus of la and corona radiata draining into medullary veins as well as the right internal cerebral vein compatible with a developmental venous anomaly. No dural venous sinus thrombosis on this non-dedicated study.   IMPRESSION: 1. No large vessel occlusion, hemodynamically significant stenosis, or aneurysm in the head or neck. 2. No acute intracranial abnormality. 3. Right frontal lobe developmental venous anomaly.   Electronically signed by: Donnice Mania MD 06/14/2024 12:25 PM EST RP Workstation: [id number]  COGNITION: Overall cognitive status: Within functional limits for tasks assessed   SENSATION: WFL  EDEMA:  None noted  MUSCLE TONE:  Normal finger to nose  DTRs:  NT  POSTURE:  Very stiff neck, forward head, elevated shoulder positioning, R lateral side bending neck posture  Cervical ROM:    Active A/PROM (deg) eval  Flexion 90%  Extension 30%  Right lateral flexion 40%  Left lateral flexion 30%  Right rotation 80%  Left rotation 80%  (Blank rows = not tested)  STRENGTH: 5/5 B shoulders  LOWER EXTREMITY MMT: NT   BED MOBILITY:  Sit to supine Complete  Independence Supine to sit Complete Independence Rolling to Right Complete Independence Rolling to Left Complete Independence  TRANSFERS: Assistive device utilized: None  Sit to stand: Complete Independence Stand to sit: Complete Independence Chair to chair: Complete Independence Floor: nt    GAIT: Gait pattern: fairly normal gait pattern Distance walked: 150' into clinic Assistive device utilized: None Level of assistance: Complete Independence Comments: gait and balance appear to be good, no ataxia noted  FUNCTIONAL TESTS:  TBD  PATIENT SURVEYS:  DHI: THE DIZZINESS HANDICAP INVENTORY (DHI)  P1. Does looking up increase your problem? 2 = Sometimes  E2. Because of your problem, do you feel frustrated? 2 = Sometimes  F3. Because of your problem, do you restrict your travel for business or recreation?  0 = No  P4. Does walking down the aisle of a supermarket increase your problems?  2 = Sometimes  F5. Because of your problem, do you have difficulty getting into or out of bed?  0 = No  F6. Does your problem significantly restrict your participation in social activities, such as going out to dinner, going to the movies, dancing, or going to parties? 2 = Sometimes  F7. Because of your problem, do you have difficulty reading?  0 = No  P8. Does performing more ambitious activities such as sports, dancing, household chores (sweeping or putting dishes away) increase your problems?  2 = Sometimes  E9. Because of your problem, are you afraid to leave your home without having without having someone accompany you?  0 = No  E10. Because of your problem have you been embarrassed in front of others?  0 = No  P11. Do quick movements of your head increase your problem?  4 = Yes  F12. Because of your problem, do you avoid heights?  0 = No  P13. Does turning over in bed increase your problem?  4 = Yes  F14. Because of your problem, is it difficult for you to do strenuous homework or yard work? 2  = Sometimes  E15. Because of your problem, are you afraid people may think you are intoxicated? 0 = No  F16. Because of your problem, is it difficult for you to go for a walk by yourself?  0 = No  P17. Does walking down a sidewalk increase your problem?  2 = Sometimes  E18.Because of your problem, is it difficult for you to concentrate 0 = No  F19. Because of your problem, is it difficult for you to walk around your house in the dark? 0 = No  E20. Because of your problem, are you afraid to stay home alone?  0 = No  E21. Because of your problem, do you feel handicapped? 0 = No  E22. Has the problem placed stress on your relationships with members of your family or friends? 0 = No  E23. Because of your problem, are you depressed?  0 = No  F24. Does your problem interfere with your job or household responsibilities?  0 = No  P25. Does bending over increase your problem?  2 = Sometimes  TOTAL 24    DHI Scoring Instructions  The patient is asked to answer each question as it pertains to dizziness or unsteadiness problems, specifically  considering their condition during the last month. Questions are designed to incorporate functional (F), physical  (P), and emotional (E) impacts on disability.   Scores greater than 10 points should be referred to balance specialists for further evaluation.   16-34 Points (mild handicap)  36-52 Points (moderate handicap)  54+ Points (severe handicap)  Minimally Detectable Change: 17 points ([address] [place], 1990)  [place], G. SHAUNNA. and [place], C. W. (1990). The development of the Dizziness Handicap Inventory. Archives of Otolaryngology - Head and Neck Surgery 116(4): [id number].   VESTIBULAR ASSESSMENT:  GENERAL OBSERVATION: normal appearing   SYMPTOM BEHAVIOR:  Subjective history: see above notes  Non-Vestibular symptoms: changes in hearing, neck pain, headaches, tinnitus, nausea/vomiting, and migraine symptoms  Type of dizziness:  Spinning/Vertigo  Frequency: multiple times daily  Duration: brief<1 min  Aggravating factors: Induced by position change: rolling to the right and supine to sit  Relieving factors: head  stationary  Progression of symptoms: better  OCULOMOTOR EXAM:  Ocular Alignment: normal  Ocular ROM: No Limitations  Spontaneous Nystagmus: absent  Gaze-Induced Nystagmus: absent  Smooth Pursuits: intact  Saccades: intact  Convergence/Divergence: nt cm   Cover-cross-cover test: nt  VESTIBULAR - OCULAR REFLEX:   Slow VOR: Normal  VOR Cancellation: Normal  Head-Impulse Test: HIT Right: negative HIT Left: negative  Dynamic Visual Acuity: nt Head shaking nystagmus with eyes open is positive for R nystagmus   POSITIONAL TESTING: Right Dix-Hallpike: no nystagmus, but patient is symptomatic Left Dix-Hallpike: no nystagmus Right Roll Test: no nystagmus Left Roll Test: no nystagmus Right Sidelying: no nystagmus Left Sidelying: no nystagmus  MOTION SENSITIVITY:  Motion Sensitivity Quotient Intensity: 0 = none, 1 = Lightheaded, 2 = Mild, 3 = Moderate, 4 = Severe, 5 = Vomiting  Intensity  1. Sitting to supine   2. Supine to L side   3. Supine to R side   4. Supine to sitting   5. L Hallpike-Dix   6. Up from L    7. R Hallpike-Dix   8. Up from R    9. Sitting, head tipped to L knee   10. Head up from L knee   11. Sitting, head tipped to R knee   12. Head up from R knee   13. Sitting head turns x5   14.Sitting head nods x5   15. In stance, 180 turn to L    16. In stance, 180 turn to R     OTHOSTATICS: not done  FUNCTIONAL GAIT: TBD                                                                                                                             TREATMENT DATE:  06/29/24 SELF CARE: Provided education on PT POC progression.instructions on Epley maneuver to do at home PRN  Canalith Repositioning:  Epley Right: Number of Reps: 2 PATIENT EDUCATION: Education details: Epley  HEP Person educated: Patient Education method: Explanation, Demonstration, Tactile cues, Verbal cues, and Handouts Education comprehension: verbalized understanding, verbal cues required, tactile cues required, and needs further education  HOME EXERCISE PROGRAM: Access Code: [email or web address] URL: [email or web address] Date: 06/29/2024 Prepared by: Garnette Montclair  Exercises - Self-Epley Maneuver Right Ear  - 1 x daily - 7 x weekly - 1 sets - 3 reps - 1 min hold - Seated Cervical Retraction  - 1 x daily - 7 x weekly - 1 sets - 10 reps - Cervical Retraction at Wall  - 1 x daily - 7 x weekly - 1 sets - 10 reps - Supine Scapular Retraction  - 1 x daily - 7 x weekly - 1 sets - 10 reps - Seated Scapular Retraction  - 1 x daily - 7 x weekly - 1 sets - 10 reps - Seated Cervical Extension AROM  - 1 x daily - 7 x weekly - 1 sets - 10 reps - Standing Shoulder Row with  Anchored Resistance  - 1 x daily - 7 x weekly - 1 sets - 10 reps     GOALS: Goals reviewed with patient? Yes  SHORT TERM GOALS: Target date: 07/27/2024   Patient will have negative R hallpike Dix testing for ability to transfer sit to supine and vice versa uninhibitied Baseline:  has dizziness but no nystagmus Goal status: INITIAL  2.  Patient will be independent with HEP Baseline: provided instructions, may need review Goal status: INITIAL  3.  Patient will report 25% improvement in  neck pain Baseline:  Goal status: INITIAL   LONG TERM GOALS: Target date: 08/24/2024   Patient will improve on DHI outcome survey to </= 6/100 for improved QOL Baseline: 24/100 Goal status: INITIAL  2.  Patient will report 75% improvement in neck pain Baseline:  Goal status: INITIAL  3.  Patient will improve cervical ROM to 75% all planes Baseline: see tables above Goal status: INITIAL  4.  Patient will be independent with advanced HEP Baseline: no advnaced HEP yet Goal status: INITIAL    ASSESSMENT:  CLINICAL  IMPRESSION: Patient is seen today for physical therapy evaluation and treatment for vertigo and ataxia.    She had severe symptoms a month ago with lying down on her R side or with rolling onto her R side.   She was staggering when she got up to walk from lying down and falling to the R.   However, she is doing much better since having some new medications.   She does exhibit positive head shaking nystagmus to the R so there is likely neurological component to this.   However, that could have been present for some time prior to the onset of the positional vertigo.   She has no nystagmus with [place] testing, but has dizziness with positional testing for the R ear.   We went ahead and did the Epley maneuver x 2 for R BPPV and she is provided with handouts to do this at home again in a week PRN.    Hopefully her symptoms will go ahead and resolve.   Her gait is good today and no ataxia noted.  It could have been she was staggering when she got up to quickly without letting her vertigo resolve when her symptoms were so severe initially that caused the gait problems.   However, she ambulates very well in the clinic today even after getting up from supine.    She only wants to do 1 PT visit for now.   I offered to address her neck pain and gave her an HEP for it.    She is going to see how she does and call us  back PRN.   Will hold her chart x 60 days and D/C if we don't hear from her.   OBJECTIVE IMPAIRMENTS: difficulty walking, decreased ROM, dizziness, hypomobility, impaired flexibility, and pain.   ACTIVITY LIMITATIONS: sleeping, transfers, and bed mobility  PARTICIPATION LIMITATIONS: meal prep, cleaning, and laundry  PERSONAL FACTORS: Age and 1-2 comorbidities:    chronic inflammatory arthritis, migraines, chronic neck pain with neck surgery, insomnia, TMJ dislocation and surgery, depression, bronchitisare also affecting patient's functional outcome.   REHAB POTENTIAL: Good  CLINICAL DECISION  MAKING: Evolving/moderate complexity  EVALUATION COMPLEXITY: Moderate   PLAN:  PT FREQUENCY: 1-2x/week  PT DURATION: 8 weeks  PLANNED INTERVENTIONS: 97164- PT Re-evaluation, 97750- Physical Performance Testing, 97110-Therapeutic exercises, 97530- Therapeutic activity, [id number]- Neuromuscular re-education, 97535- Self Care, [id number]- Manual therapy, [id number]- Canalith repositioning, H9716-  Electrical stimulation (unattended), [id number]- Ultrasound, 79439 (1-2 muscles), 20561 (3+ muscles)- Dry Needling, Patient/Family education, Taping, Joint mobilization, Spinal mobilization, Cryotherapy, and Moist heat  PLAN FOR NEXT SESSION: patient will call and schedule with us  if she needs to return.   Wants to try on her own for now.  Will hold chart x 60 days   Shantanique Hodo, PT 06/29/2024, 9:24 PM  "

## 2024-06-30 MED ORDER — LINACLOTIDE 290 MCG PO CAPS: 290.0000 ug | ORAL_CAPSULE | Freq: Every day | ORAL | 1 refills | Status: AC

## 2024-06-30 NOTE — Addendum Note (Signed)
 Addended by: CLAUDENE NAOMIE SAILOR on: 06/30/2024 10:48 AM   Modules accepted: Orders

## 2024-07-04 ENCOUNTER — Other Ambulatory Visit (HOSPITAL_COMMUNITY): Payer: Self-pay

## 2024-07-04 ENCOUNTER — Telehealth: Payer: Self-pay

## 2024-07-04 NOTE — Telephone Encounter (Signed)
 Pharmacy Patient Advocate Encounter   Received notification from Physician's Office that prior authorization for Linzess  capsules is required/requested.   Insurance verification completed.   The patient is insured through [employer].   Per test claim: PA required; PA submitted to above mentioned insurance via Latent Key/confirmation #/EOC [id number] Status is pending

## 2024-07-04 NOTE — Telephone Encounter (Signed)
 Hi Delora -  THere are Linzess  samples left for you at 2nd floor reception desk.  Kind regards, Damien, RN

## 2024-07-06 ENCOUNTER — Other Ambulatory Visit (HOSPITAL_COMMUNITY): Payer: Self-pay

## 2024-07-06 NOTE — Telephone Encounter (Signed)
 Pharmacy Patient Advocate Encounter  Received notification from [facility] that Prior Authorization for Linzess  capsules has been APPROVED from 07-04-2024 to 07-04-2025   PA #/Case ID/Reference #: [id number]

## 2024-10-05 ENCOUNTER — Ambulatory Visit: Payer: PRIVATE HEALTH INSURANCE | Admitting: Neurology

## 2024-10-19 ENCOUNTER — Ambulatory Visit: Payer: Self-pay | Admitting: Neurology
